# Patient Record
Sex: Female | Born: 1935
Health system: Southern US, Community
[De-identification: ages and names within clinical notes are randomized; demographics above are authoritative.]

## PROBLEM LIST (undated history)

## (undated) DIAGNOSIS — I2510 Atherosclerotic heart disease of native coronary artery without angina pectoris: Secondary | ICD-10-CM

## (undated) DIAGNOSIS — I700 Atherosclerosis of aorta: Secondary | ICD-10-CM

## (undated) DIAGNOSIS — J449 Chronic obstructive pulmonary disease, unspecified: Secondary | ICD-10-CM

## (undated) DIAGNOSIS — J189 Pneumonia, unspecified organism: Secondary | ICD-10-CM

## (undated) DIAGNOSIS — I639 Cerebral infarction, unspecified: Secondary | ICD-10-CM

## (undated) DIAGNOSIS — F0390 Unspecified dementia without behavioral disturbance: Secondary | ICD-10-CM

## (undated) DIAGNOSIS — R112 Nausea with vomiting, unspecified: Secondary | ICD-10-CM

## (undated) DIAGNOSIS — Z9889 Other specified postprocedural states: Secondary | ICD-10-CM

## (undated) DIAGNOSIS — B351 Tinea unguium: Secondary | ICD-10-CM

## (undated) DIAGNOSIS — I10 Essential (primary) hypertension: Secondary | ICD-10-CM

## (undated) DIAGNOSIS — I2584 Coronary atherosclerosis due to calcified coronary lesion: Secondary | ICD-10-CM

## (undated) DIAGNOSIS — I4821 Permanent atrial fibrillation: Secondary | ICD-10-CM

## (undated) DIAGNOSIS — E785 Hyperlipidemia, unspecified: Secondary | ICD-10-CM

## (undated) DIAGNOSIS — R251 Tremor, unspecified: Secondary | ICD-10-CM

## (undated) DIAGNOSIS — I5032 Chronic diastolic (congestive) heart failure: Secondary | ICD-10-CM

## (undated) DIAGNOSIS — F419 Anxiety disorder, unspecified: Secondary | ICD-10-CM

## (undated) DIAGNOSIS — A159 Respiratory tuberculosis unspecified: Secondary | ICD-10-CM

## (undated) DIAGNOSIS — I509 Heart failure, unspecified: Secondary | ICD-10-CM

## (undated) HISTORY — DX: Essential (primary) hypertension: I10

## (undated) HISTORY — DX: Hyperlipidemia, unspecified: E78.5

## (undated) HISTORY — DX: Atherosclerotic heart disease of native coronary artery without angina pectoris: I25.10

## (undated) HISTORY — DX: Atherosclerosis of aorta: I70.0

## (undated) HISTORY — DX: Anxiety disorder, unspecified: F41.9

## (undated) HISTORY — DX: Tremor, unspecified: R25.1

## (undated) HISTORY — DX: Heart failure, unspecified: I50.9

## (undated) HISTORY — DX: Coronary atherosclerosis due to calcified coronary lesion: I25.84

## (undated) HISTORY — DX: Permanent atrial fibrillation: I48.21

## (undated) HISTORY — PX: NASAL SINUS SURGERY: SHX719

## (undated) HISTORY — PX: TOTAL ABDOMINAL HYSTERECTOMY: SHX209

## (undated) HISTORY — PX: HERNIA REPAIR: SHX51

## (undated) HISTORY — DX: Tinea unguium: B35.1

## (undated) HISTORY — DX: Chronic diastolic (congestive) heart failure: I50.32

## (undated) HISTORY — PX: FOOT SURGERY: SHX648

## (undated) HISTORY — PX: BLADDER SURGERY: SHX569

## (undated) HISTORY — PX: CARPAL TUNNEL RELEASE: SHX101

## (undated) HISTORY — DX: Cerebral infarction, unspecified: I63.9

---

## 2008-10-28 DIAGNOSIS — I639 Cerebral infarction, unspecified: Secondary | ICD-10-CM

## 2008-10-28 HISTORY — DX: Cerebral infarction, unspecified: I63.9

## 2008-10-29 DIAGNOSIS — Z8673 Personal history of transient ischemic attack (TIA), and cerebral infarction without residual deficits: Secondary | ICD-10-CM | POA: Insufficient documentation

## 2011-04-21 DIAGNOSIS — J209 Acute bronchitis, unspecified: Secondary | ICD-10-CM | POA: Diagnosis not present

## 2011-04-21 DIAGNOSIS — J019 Acute sinusitis, unspecified: Secondary | ICD-10-CM | POA: Diagnosis not present

## 2011-04-27 DIAGNOSIS — R413 Other amnesia: Secondary | ICD-10-CM | POA: Diagnosis not present

## 2011-04-27 DIAGNOSIS — Z8679 Personal history of other diseases of the circulatory system: Secondary | ICD-10-CM | POA: Diagnosis not present

## 2011-05-08 DIAGNOSIS — R259 Unspecified abnormal involuntary movements: Secondary | ICD-10-CM | POA: Diagnosis not present

## 2011-07-08 DIAGNOSIS — I10 Essential (primary) hypertension: Secondary | ICD-10-CM | POA: Diagnosis not present

## 2011-07-08 DIAGNOSIS — S0990XA Unspecified injury of head, initial encounter: Secondary | ICD-10-CM | POA: Diagnosis not present

## 2011-07-08 DIAGNOSIS — Z8673 Personal history of transient ischemic attack (TIA), and cerebral infarction without residual deficits: Secondary | ICD-10-CM | POA: Diagnosis not present

## 2011-07-08 DIAGNOSIS — S79919A Unspecified injury of unspecified hip, initial encounter: Secondary | ICD-10-CM | POA: Diagnosis not present

## 2011-07-08 DIAGNOSIS — S79929A Unspecified injury of unspecified thigh, initial encounter: Secondary | ICD-10-CM | POA: Diagnosis not present

## 2011-07-08 DIAGNOSIS — G252 Other specified forms of tremor: Secondary | ICD-10-CM | POA: Diagnosis present

## 2011-07-08 DIAGNOSIS — M949 Disorder of cartilage, unspecified: Secondary | ICD-10-CM | POA: Diagnosis present

## 2011-07-08 DIAGNOSIS — D509 Iron deficiency anemia, unspecified: Secondary | ICD-10-CM | POA: Diagnosis not present

## 2011-07-08 DIAGNOSIS — G250 Essential tremor: Secondary | ICD-10-CM | POA: Diagnosis present

## 2011-07-08 DIAGNOSIS — M899 Disorder of bone, unspecified: Secondary | ICD-10-CM | POA: Diagnosis not present

## 2011-07-08 DIAGNOSIS — N949 Unspecified condition associated with female genital organs and menstrual cycle: Secondary | ICD-10-CM | POA: Diagnosis not present

## 2011-07-08 DIAGNOSIS — S32509A Unspecified fracture of unspecified pubis, initial encounter for closed fracture: Secondary | ICD-10-CM | POA: Diagnosis not present

## 2011-07-08 DIAGNOSIS — E785 Hyperlipidemia, unspecified: Secondary | ICD-10-CM | POA: Diagnosis present

## 2011-07-08 DIAGNOSIS — E782 Mixed hyperlipidemia: Secondary | ICD-10-CM | POA: Diagnosis not present

## 2011-07-10 DIAGNOSIS — I10 Essential (primary) hypertension: Secondary | ICD-10-CM | POA: Diagnosis not present

## 2011-07-10 DIAGNOSIS — M6281 Muscle weakness (generalized): Secondary | ICD-10-CM | POA: Diagnosis not present

## 2011-07-10 DIAGNOSIS — S0990XA Unspecified injury of head, initial encounter: Secondary | ICD-10-CM | POA: Diagnosis not present

## 2011-07-10 DIAGNOSIS — R269 Unspecified abnormalities of gait and mobility: Secondary | ICD-10-CM | POA: Diagnosis not present

## 2011-07-10 DIAGNOSIS — E785 Hyperlipidemia, unspecified: Secondary | ICD-10-CM | POA: Diagnosis not present

## 2011-07-10 DIAGNOSIS — IMO0001 Reserved for inherently not codable concepts without codable children: Secondary | ICD-10-CM | POA: Diagnosis not present

## 2011-07-13 DIAGNOSIS — I10 Essential (primary) hypertension: Secondary | ICD-10-CM | POA: Diagnosis not present

## 2011-07-13 DIAGNOSIS — M6281 Muscle weakness (generalized): Secondary | ICD-10-CM | POA: Diagnosis not present

## 2011-07-13 DIAGNOSIS — R269 Unspecified abnormalities of gait and mobility: Secondary | ICD-10-CM | POA: Diagnosis not present

## 2011-07-13 DIAGNOSIS — E785 Hyperlipidemia, unspecified: Secondary | ICD-10-CM | POA: Diagnosis not present

## 2011-07-13 DIAGNOSIS — S0990XA Unspecified injury of head, initial encounter: Secondary | ICD-10-CM | POA: Diagnosis not present

## 2011-07-13 DIAGNOSIS — IMO0001 Reserved for inherently not codable concepts without codable children: Secondary | ICD-10-CM | POA: Diagnosis not present

## 2011-07-14 DIAGNOSIS — E785 Hyperlipidemia, unspecified: Secondary | ICD-10-CM | POA: Diagnosis not present

## 2011-07-14 DIAGNOSIS — M6281 Muscle weakness (generalized): Secondary | ICD-10-CM | POA: Diagnosis not present

## 2011-07-14 DIAGNOSIS — R269 Unspecified abnormalities of gait and mobility: Secondary | ICD-10-CM | POA: Diagnosis not present

## 2011-07-14 DIAGNOSIS — S0990XA Unspecified injury of head, initial encounter: Secondary | ICD-10-CM | POA: Diagnosis not present

## 2011-07-14 DIAGNOSIS — IMO0001 Reserved for inherently not codable concepts without codable children: Secondary | ICD-10-CM | POA: Diagnosis not present

## 2011-07-14 DIAGNOSIS — I10 Essential (primary) hypertension: Secondary | ICD-10-CM | POA: Diagnosis not present

## 2011-07-15 DIAGNOSIS — IMO0001 Reserved for inherently not codable concepts without codable children: Secondary | ICD-10-CM | POA: Diagnosis not present

## 2011-07-15 DIAGNOSIS — I10 Essential (primary) hypertension: Secondary | ICD-10-CM | POA: Diagnosis not present

## 2011-07-15 DIAGNOSIS — S0990XA Unspecified injury of head, initial encounter: Secondary | ICD-10-CM | POA: Diagnosis not present

## 2011-07-15 DIAGNOSIS — R269 Unspecified abnormalities of gait and mobility: Secondary | ICD-10-CM | POA: Diagnosis not present

## 2011-07-15 DIAGNOSIS — E785 Hyperlipidemia, unspecified: Secondary | ICD-10-CM | POA: Diagnosis not present

## 2011-07-15 DIAGNOSIS — M6281 Muscle weakness (generalized): Secondary | ICD-10-CM | POA: Diagnosis not present

## 2011-07-17 DIAGNOSIS — E785 Hyperlipidemia, unspecified: Secondary | ICD-10-CM | POA: Diagnosis not present

## 2011-07-17 DIAGNOSIS — M6281 Muscle weakness (generalized): Secondary | ICD-10-CM | POA: Diagnosis not present

## 2011-07-17 DIAGNOSIS — S0990XA Unspecified injury of head, initial encounter: Secondary | ICD-10-CM | POA: Diagnosis not present

## 2011-07-17 DIAGNOSIS — R269 Unspecified abnormalities of gait and mobility: Secondary | ICD-10-CM | POA: Diagnosis not present

## 2011-07-17 DIAGNOSIS — IMO0001 Reserved for inherently not codable concepts without codable children: Secondary | ICD-10-CM | POA: Diagnosis not present

## 2011-07-17 DIAGNOSIS — I10 Essential (primary) hypertension: Secondary | ICD-10-CM | POA: Diagnosis not present

## 2011-07-20 DIAGNOSIS — I10 Essential (primary) hypertension: Secondary | ICD-10-CM | POA: Diagnosis not present

## 2011-07-20 DIAGNOSIS — R269 Unspecified abnormalities of gait and mobility: Secondary | ICD-10-CM | POA: Diagnosis not present

## 2011-07-20 DIAGNOSIS — S0990XA Unspecified injury of head, initial encounter: Secondary | ICD-10-CM | POA: Diagnosis not present

## 2011-07-20 DIAGNOSIS — IMO0001 Reserved for inherently not codable concepts without codable children: Secondary | ICD-10-CM | POA: Diagnosis not present

## 2011-07-20 DIAGNOSIS — M6281 Muscle weakness (generalized): Secondary | ICD-10-CM | POA: Diagnosis not present

## 2011-07-20 DIAGNOSIS — E785 Hyperlipidemia, unspecified: Secondary | ICD-10-CM | POA: Diagnosis not present

## 2011-07-21 DIAGNOSIS — E785 Hyperlipidemia, unspecified: Secondary | ICD-10-CM | POA: Diagnosis not present

## 2011-07-21 DIAGNOSIS — R269 Unspecified abnormalities of gait and mobility: Secondary | ICD-10-CM | POA: Diagnosis not present

## 2011-07-21 DIAGNOSIS — I10 Essential (primary) hypertension: Secondary | ICD-10-CM | POA: Diagnosis not present

## 2011-07-21 DIAGNOSIS — IMO0001 Reserved for inherently not codable concepts without codable children: Secondary | ICD-10-CM | POA: Diagnosis not present

## 2011-07-21 DIAGNOSIS — M6281 Muscle weakness (generalized): Secondary | ICD-10-CM | POA: Diagnosis not present

## 2011-07-21 DIAGNOSIS — S0990XA Unspecified injury of head, initial encounter: Secondary | ICD-10-CM | POA: Diagnosis not present

## 2011-07-22 DIAGNOSIS — IMO0001 Reserved for inherently not codable concepts without codable children: Secondary | ICD-10-CM | POA: Diagnosis not present

## 2011-07-22 DIAGNOSIS — R269 Unspecified abnormalities of gait and mobility: Secondary | ICD-10-CM | POA: Diagnosis not present

## 2011-07-22 DIAGNOSIS — M6281 Muscle weakness (generalized): Secondary | ICD-10-CM | POA: Diagnosis not present

## 2011-07-22 DIAGNOSIS — E785 Hyperlipidemia, unspecified: Secondary | ICD-10-CM | POA: Diagnosis not present

## 2011-07-22 DIAGNOSIS — S0990XA Unspecified injury of head, initial encounter: Secondary | ICD-10-CM | POA: Diagnosis not present

## 2011-07-22 DIAGNOSIS — I10 Essential (primary) hypertension: Secondary | ICD-10-CM | POA: Diagnosis not present

## 2011-07-23 DIAGNOSIS — I10 Essential (primary) hypertension: Secondary | ICD-10-CM | POA: Diagnosis not present

## 2011-07-23 DIAGNOSIS — S329XXA Fracture of unspecified parts of lumbosacral spine and pelvis, initial encounter for closed fracture: Secondary | ICD-10-CM | POA: Diagnosis not present

## 2011-07-23 DIAGNOSIS — M6281 Muscle weakness (generalized): Secondary | ICD-10-CM | POA: Diagnosis not present

## 2011-07-23 DIAGNOSIS — IMO0001 Reserved for inherently not codable concepts without codable children: Secondary | ICD-10-CM | POA: Diagnosis not present

## 2011-07-23 DIAGNOSIS — E785 Hyperlipidemia, unspecified: Secondary | ICD-10-CM | POA: Diagnosis not present

## 2011-07-23 DIAGNOSIS — R269 Unspecified abnormalities of gait and mobility: Secondary | ICD-10-CM | POA: Diagnosis not present

## 2011-07-23 DIAGNOSIS — S0990XA Unspecified injury of head, initial encounter: Secondary | ICD-10-CM | POA: Diagnosis not present

## 2011-07-23 DIAGNOSIS — E782 Mixed hyperlipidemia: Secondary | ICD-10-CM | POA: Diagnosis not present

## 2011-07-24 DIAGNOSIS — M6281 Muscle weakness (generalized): Secondary | ICD-10-CM | POA: Diagnosis not present

## 2011-07-24 DIAGNOSIS — S0990XA Unspecified injury of head, initial encounter: Secondary | ICD-10-CM | POA: Diagnosis not present

## 2011-07-24 DIAGNOSIS — I10 Essential (primary) hypertension: Secondary | ICD-10-CM | POA: Diagnosis not present

## 2011-07-24 DIAGNOSIS — E785 Hyperlipidemia, unspecified: Secondary | ICD-10-CM | POA: Diagnosis not present

## 2011-07-24 DIAGNOSIS — IMO0001 Reserved for inherently not codable concepts without codable children: Secondary | ICD-10-CM | POA: Diagnosis not present

## 2011-07-24 DIAGNOSIS — R269 Unspecified abnormalities of gait and mobility: Secondary | ICD-10-CM | POA: Diagnosis not present

## 2011-07-25 DIAGNOSIS — R269 Unspecified abnormalities of gait and mobility: Secondary | ICD-10-CM | POA: Diagnosis not present

## 2011-07-25 DIAGNOSIS — S0990XA Unspecified injury of head, initial encounter: Secondary | ICD-10-CM | POA: Diagnosis not present

## 2011-07-25 DIAGNOSIS — IMO0001 Reserved for inherently not codable concepts without codable children: Secondary | ICD-10-CM | POA: Diagnosis not present

## 2011-07-25 DIAGNOSIS — M6281 Muscle weakness (generalized): Secondary | ICD-10-CM | POA: Diagnosis not present

## 2011-07-25 DIAGNOSIS — I10 Essential (primary) hypertension: Secondary | ICD-10-CM | POA: Diagnosis not present

## 2011-07-25 DIAGNOSIS — E785 Hyperlipidemia, unspecified: Secondary | ICD-10-CM | POA: Diagnosis not present

## 2011-07-27 DIAGNOSIS — E785 Hyperlipidemia, unspecified: Secondary | ICD-10-CM | POA: Diagnosis not present

## 2011-07-27 DIAGNOSIS — R269 Unspecified abnormalities of gait and mobility: Secondary | ICD-10-CM | POA: Diagnosis not present

## 2011-07-27 DIAGNOSIS — IMO0001 Reserved for inherently not codable concepts without codable children: Secondary | ICD-10-CM | POA: Diagnosis not present

## 2011-07-27 DIAGNOSIS — S0990XA Unspecified injury of head, initial encounter: Secondary | ICD-10-CM | POA: Diagnosis not present

## 2011-07-27 DIAGNOSIS — I10 Essential (primary) hypertension: Secondary | ICD-10-CM | POA: Diagnosis not present

## 2011-07-27 DIAGNOSIS — M6281 Muscle weakness (generalized): Secondary | ICD-10-CM | POA: Diagnosis not present

## 2011-07-28 DIAGNOSIS — E785 Hyperlipidemia, unspecified: Secondary | ICD-10-CM | POA: Diagnosis not present

## 2011-07-28 DIAGNOSIS — R269 Unspecified abnormalities of gait and mobility: Secondary | ICD-10-CM | POA: Diagnosis not present

## 2011-07-28 DIAGNOSIS — IMO0001 Reserved for inherently not codable concepts without codable children: Secondary | ICD-10-CM | POA: Diagnosis not present

## 2011-07-28 DIAGNOSIS — I10 Essential (primary) hypertension: Secondary | ICD-10-CM | POA: Diagnosis not present

## 2011-07-28 DIAGNOSIS — M6281 Muscle weakness (generalized): Secondary | ICD-10-CM | POA: Diagnosis not present

## 2011-07-28 DIAGNOSIS — S0990XA Unspecified injury of head, initial encounter: Secondary | ICD-10-CM | POA: Diagnosis not present

## 2011-07-29 DIAGNOSIS — M6281 Muscle weakness (generalized): Secondary | ICD-10-CM | POA: Diagnosis not present

## 2011-07-29 DIAGNOSIS — I10 Essential (primary) hypertension: Secondary | ICD-10-CM | POA: Diagnosis not present

## 2011-07-29 DIAGNOSIS — IMO0001 Reserved for inherently not codable concepts without codable children: Secondary | ICD-10-CM | POA: Diagnosis not present

## 2011-07-29 DIAGNOSIS — R269 Unspecified abnormalities of gait and mobility: Secondary | ICD-10-CM | POA: Diagnosis not present

## 2011-07-29 DIAGNOSIS — E785 Hyperlipidemia, unspecified: Secondary | ICD-10-CM | POA: Diagnosis not present

## 2011-07-29 DIAGNOSIS — S0990XA Unspecified injury of head, initial encounter: Secondary | ICD-10-CM | POA: Diagnosis not present

## 2011-07-30 DIAGNOSIS — R269 Unspecified abnormalities of gait and mobility: Secondary | ICD-10-CM | POA: Diagnosis not present

## 2011-07-30 DIAGNOSIS — E785 Hyperlipidemia, unspecified: Secondary | ICD-10-CM | POA: Diagnosis not present

## 2011-07-30 DIAGNOSIS — S0990XA Unspecified injury of head, initial encounter: Secondary | ICD-10-CM | POA: Diagnosis not present

## 2011-07-30 DIAGNOSIS — M6281 Muscle weakness (generalized): Secondary | ICD-10-CM | POA: Diagnosis not present

## 2011-07-30 DIAGNOSIS — I10 Essential (primary) hypertension: Secondary | ICD-10-CM | POA: Diagnosis not present

## 2011-07-30 DIAGNOSIS — IMO0001 Reserved for inherently not codable concepts without codable children: Secondary | ICD-10-CM | POA: Diagnosis not present

## 2011-07-31 DIAGNOSIS — I10 Essential (primary) hypertension: Secondary | ICD-10-CM | POA: Diagnosis not present

## 2011-07-31 DIAGNOSIS — M6281 Muscle weakness (generalized): Secondary | ICD-10-CM | POA: Diagnosis not present

## 2011-07-31 DIAGNOSIS — S0990XA Unspecified injury of head, initial encounter: Secondary | ICD-10-CM | POA: Diagnosis not present

## 2011-07-31 DIAGNOSIS — E785 Hyperlipidemia, unspecified: Secondary | ICD-10-CM | POA: Diagnosis not present

## 2011-07-31 DIAGNOSIS — R269 Unspecified abnormalities of gait and mobility: Secondary | ICD-10-CM | POA: Diagnosis not present

## 2011-07-31 DIAGNOSIS — IMO0001 Reserved for inherently not codable concepts without codable children: Secondary | ICD-10-CM | POA: Diagnosis not present

## 2011-08-20 DIAGNOSIS — S329XXA Fracture of unspecified parts of lumbosacral spine and pelvis, initial encounter for closed fracture: Secondary | ICD-10-CM | POA: Diagnosis not present

## 2011-11-04 DIAGNOSIS — S81009A Unspecified open wound, unspecified knee, initial encounter: Secondary | ICD-10-CM | POA: Diagnosis not present

## 2011-11-04 DIAGNOSIS — S81809A Unspecified open wound, unspecified lower leg, initial encounter: Secondary | ICD-10-CM | POA: Diagnosis not present

## 2011-11-04 DIAGNOSIS — I10 Essential (primary) hypertension: Secondary | ICD-10-CM | POA: Diagnosis not present

## 2011-11-06 DIAGNOSIS — S81009A Unspecified open wound, unspecified knee, initial encounter: Secondary | ICD-10-CM | POA: Diagnosis not present

## 2011-11-06 DIAGNOSIS — S91009A Unspecified open wound, unspecified ankle, initial encounter: Secondary | ICD-10-CM | POA: Diagnosis not present

## 2011-11-09 DIAGNOSIS — E782 Mixed hyperlipidemia: Secondary | ICD-10-CM | POA: Diagnosis not present

## 2011-11-09 DIAGNOSIS — S81809A Unspecified open wound, unspecified lower leg, initial encounter: Secondary | ICD-10-CM | POA: Diagnosis not present

## 2011-11-09 DIAGNOSIS — I10 Essential (primary) hypertension: Secondary | ICD-10-CM | POA: Diagnosis not present

## 2011-11-11 DIAGNOSIS — S81809A Unspecified open wound, unspecified lower leg, initial encounter: Secondary | ICD-10-CM | POA: Diagnosis not present

## 2011-11-13 DIAGNOSIS — W1809XA Striking against other object with subsequent fall, initial encounter: Secondary | ICD-10-CM | POA: Diagnosis not present

## 2011-11-13 DIAGNOSIS — S91009A Unspecified open wound, unspecified ankle, initial encounter: Secondary | ICD-10-CM | POA: Diagnosis not present

## 2011-11-13 DIAGNOSIS — S81009A Unspecified open wound, unspecified knee, initial encounter: Secondary | ICD-10-CM | POA: Diagnosis not present

## 2011-11-19 DIAGNOSIS — I679 Cerebrovascular disease, unspecified: Secondary | ICD-10-CM | POA: Diagnosis not present

## 2011-11-19 DIAGNOSIS — Z8679 Personal history of other diseases of the circulatory system: Secondary | ICD-10-CM | POA: Diagnosis not present

## 2011-11-19 DIAGNOSIS — G250 Essential tremor: Secondary | ICD-10-CM | POA: Diagnosis not present

## 2011-11-19 DIAGNOSIS — I69919 Unspecified symptoms and signs involving cognitive functions following unspecified cerebrovascular disease: Secondary | ICD-10-CM | POA: Diagnosis not present

## 2011-11-27 DIAGNOSIS — S81809A Unspecified open wound, unspecified lower leg, initial encounter: Secondary | ICD-10-CM | POA: Diagnosis not present

## 2011-11-27 DIAGNOSIS — S81009A Unspecified open wound, unspecified knee, initial encounter: Secondary | ICD-10-CM | POA: Diagnosis not present

## 2011-11-27 DIAGNOSIS — W1809XA Striking against other object with subsequent fall, initial encounter: Secondary | ICD-10-CM | POA: Diagnosis not present

## 2011-11-27 DIAGNOSIS — I87399 Chronic venous hypertension (idiopathic) with other complications of unspecified lower extremity: Secondary | ICD-10-CM | POA: Diagnosis not present

## 2011-11-27 DIAGNOSIS — S91009A Unspecified open wound, unspecified ankle, initial encounter: Secondary | ICD-10-CM | POA: Diagnosis not present

## 2011-12-11 DIAGNOSIS — W1809XA Striking against other object with subsequent fall, initial encounter: Secondary | ICD-10-CM | POA: Diagnosis not present

## 2011-12-11 DIAGNOSIS — S91009A Unspecified open wound, unspecified ankle, initial encounter: Secondary | ICD-10-CM | POA: Diagnosis not present

## 2011-12-11 DIAGNOSIS — S81809A Unspecified open wound, unspecified lower leg, initial encounter: Secondary | ICD-10-CM | POA: Diagnosis not present

## 2011-12-11 DIAGNOSIS — S81009A Unspecified open wound, unspecified knee, initial encounter: Secondary | ICD-10-CM | POA: Diagnosis not present

## 2012-01-19 DIAGNOSIS — E871 Hypo-osmolality and hyponatremia: Secondary | ICD-10-CM | POA: Diagnosis not present

## 2012-01-19 DIAGNOSIS — I6320 Cerebral infarction due to unspecified occlusion or stenosis of unspecified precerebral arteries: Secondary | ICD-10-CM | POA: Diagnosis not present

## 2012-01-19 DIAGNOSIS — Z23 Encounter for immunization: Secondary | ICD-10-CM | POA: Diagnosis not present

## 2012-01-19 DIAGNOSIS — M25569 Pain in unspecified knee: Secondary | ICD-10-CM | POA: Diagnosis not present

## 2012-02-10 DIAGNOSIS — Z1231 Encounter for screening mammogram for malignant neoplasm of breast: Secondary | ICD-10-CM | POA: Diagnosis not present

## 2012-02-17 DIAGNOSIS — H04129 Dry eye syndrome of unspecified lacrimal gland: Secondary | ICD-10-CM | POA: Diagnosis not present

## 2012-03-14 DIAGNOSIS — E871 Hypo-osmolality and hyponatremia: Secondary | ICD-10-CM | POA: Diagnosis not present

## 2012-04-29 DIAGNOSIS — G250 Essential tremor: Secondary | ICD-10-CM | POA: Diagnosis not present

## 2012-04-29 DIAGNOSIS — L0390 Cellulitis, unspecified: Secondary | ICD-10-CM | POA: Diagnosis not present

## 2012-04-29 DIAGNOSIS — I8310 Varicose veins of unspecified lower extremity with inflammation: Secondary | ICD-10-CM | POA: Diagnosis not present

## 2012-05-09 DIAGNOSIS — I8310 Varicose veins of unspecified lower extremity with inflammation: Secondary | ICD-10-CM | POA: Diagnosis not present

## 2012-05-09 DIAGNOSIS — L0390 Cellulitis, unspecified: Secondary | ICD-10-CM | POA: Diagnosis not present

## 2012-05-09 DIAGNOSIS — L0291 Cutaneous abscess, unspecified: Secondary | ICD-10-CM | POA: Diagnosis not present

## 2012-08-01 DIAGNOSIS — I8310 Varicose veins of unspecified lower extremity with inflammation: Secondary | ICD-10-CM | POA: Diagnosis not present

## 2012-08-01 DIAGNOSIS — E871 Hypo-osmolality and hyponatremia: Secondary | ICD-10-CM | POA: Diagnosis not present

## 2012-08-01 DIAGNOSIS — E782 Mixed hyperlipidemia: Secondary | ICD-10-CM | POA: Diagnosis not present

## 2012-08-01 DIAGNOSIS — L0291 Cutaneous abscess, unspecified: Secondary | ICD-10-CM | POA: Diagnosis not present

## 2012-08-08 DIAGNOSIS — D649 Anemia, unspecified: Secondary | ICD-10-CM | POA: Diagnosis not present

## 2012-08-08 DIAGNOSIS — E871 Hypo-osmolality and hyponatremia: Secondary | ICD-10-CM | POA: Diagnosis not present

## 2012-08-08 DIAGNOSIS — D239 Other benign neoplasm of skin, unspecified: Secondary | ICD-10-CM | POA: Diagnosis not present

## 2012-08-08 DIAGNOSIS — I10 Essential (primary) hypertension: Secondary | ICD-10-CM | POA: Diagnosis not present

## 2012-11-21 DIAGNOSIS — Z8679 Personal history of other diseases of the circulatory system: Secondary | ICD-10-CM | POA: Diagnosis not present

## 2012-11-21 DIAGNOSIS — G250 Essential tremor: Secondary | ICD-10-CM | POA: Diagnosis not present

## 2012-12-02 DIAGNOSIS — E871 Hypo-osmolality and hyponatremia: Secondary | ICD-10-CM | POA: Diagnosis not present

## 2012-12-02 DIAGNOSIS — D509 Iron deficiency anemia, unspecified: Secondary | ICD-10-CM | POA: Diagnosis not present

## 2012-12-02 DIAGNOSIS — R7309 Other abnormal glucose: Secondary | ICD-10-CM | POA: Diagnosis not present

## 2012-12-09 DIAGNOSIS — I6320 Cerebral infarction due to unspecified occlusion or stenosis of unspecified precerebral arteries: Secondary | ICD-10-CM | POA: Diagnosis not present

## 2012-12-09 DIAGNOSIS — I10 Essential (primary) hypertension: Secondary | ICD-10-CM | POA: Diagnosis not present

## 2012-12-09 DIAGNOSIS — E871 Hypo-osmolality and hyponatremia: Secondary | ICD-10-CM | POA: Diagnosis not present

## 2012-12-09 DIAGNOSIS — Z23 Encounter for immunization: Secondary | ICD-10-CM | POA: Diagnosis not present

## 2013-03-30 DIAGNOSIS — I4821 Permanent atrial fibrillation: Secondary | ICD-10-CM

## 2013-03-30 HISTORY — DX: Permanent atrial fibrillation: I48.21

## 2013-04-18 DIAGNOSIS — Z7901 Long term (current) use of anticoagulants: Secondary | ICD-10-CM | POA: Diagnosis not present

## 2013-04-18 DIAGNOSIS — I4891 Unspecified atrial fibrillation: Secondary | ICD-10-CM | POA: Diagnosis not present

## 2013-04-18 DIAGNOSIS — I6320 Cerebral infarction due to unspecified occlusion or stenosis of unspecified precerebral arteries: Secondary | ICD-10-CM | POA: Diagnosis not present

## 2013-04-18 DIAGNOSIS — E871 Hypo-osmolality and hyponatremia: Secondary | ICD-10-CM | POA: Diagnosis not present

## 2013-04-18 DIAGNOSIS — E785 Hyperlipidemia, unspecified: Secondary | ICD-10-CM | POA: Diagnosis not present

## 2013-04-18 DIAGNOSIS — I10 Essential (primary) hypertension: Secondary | ICD-10-CM | POA: Diagnosis not present

## 2013-04-18 DIAGNOSIS — J189 Pneumonia, unspecified organism: Secondary | ICD-10-CM | POA: Diagnosis not present

## 2013-04-18 DIAGNOSIS — Z0000 Encounter for general adult medical examination without abnormal findings: Secondary | ICD-10-CM | POA: Diagnosis not present

## 2013-04-25 DIAGNOSIS — I10 Essential (primary) hypertension: Secondary | ICD-10-CM | POA: Diagnosis not present

## 2013-04-25 DIAGNOSIS — I4891 Unspecified atrial fibrillation: Secondary | ICD-10-CM | POA: Diagnosis not present

## 2013-05-02 DIAGNOSIS — I10 Essential (primary) hypertension: Secondary | ICD-10-CM | POA: Diagnosis not present

## 2013-05-02 DIAGNOSIS — I4891 Unspecified atrial fibrillation: Secondary | ICD-10-CM | POA: Diagnosis not present

## 2013-05-02 DIAGNOSIS — I428 Other cardiomyopathies: Secondary | ICD-10-CM | POA: Diagnosis not present

## 2013-05-02 DIAGNOSIS — E785 Hyperlipidemia, unspecified: Secondary | ICD-10-CM | POA: Diagnosis not present

## 2013-05-09 DIAGNOSIS — I428 Other cardiomyopathies: Secondary | ICD-10-CM | POA: Diagnosis not present

## 2013-05-09 DIAGNOSIS — I10 Essential (primary) hypertension: Secondary | ICD-10-CM | POA: Diagnosis not present

## 2013-05-09 DIAGNOSIS — E785 Hyperlipidemia, unspecified: Secondary | ICD-10-CM | POA: Diagnosis not present

## 2013-05-09 DIAGNOSIS — I4891 Unspecified atrial fibrillation: Secondary | ICD-10-CM | POA: Diagnosis not present

## 2013-05-16 DIAGNOSIS — I428 Other cardiomyopathies: Secondary | ICD-10-CM | POA: Diagnosis not present

## 2013-05-16 DIAGNOSIS — I10 Essential (primary) hypertension: Secondary | ICD-10-CM | POA: Diagnosis not present

## 2013-05-16 DIAGNOSIS — E785 Hyperlipidemia, unspecified: Secondary | ICD-10-CM | POA: Diagnosis not present

## 2013-05-16 DIAGNOSIS — I4891 Unspecified atrial fibrillation: Secondary | ICD-10-CM | POA: Diagnosis not present

## 2013-06-13 DIAGNOSIS — Z7901 Long term (current) use of anticoagulants: Secondary | ICD-10-CM | POA: Diagnosis not present

## 2013-06-13 DIAGNOSIS — E785 Hyperlipidemia, unspecified: Secondary | ICD-10-CM | POA: Diagnosis not present

## 2013-06-13 DIAGNOSIS — I10 Essential (primary) hypertension: Secondary | ICD-10-CM | POA: Diagnosis not present

## 2013-06-13 DIAGNOSIS — I4891 Unspecified atrial fibrillation: Secondary | ICD-10-CM | POA: Diagnosis not present

## 2013-06-13 DIAGNOSIS — I428 Other cardiomyopathies: Secondary | ICD-10-CM | POA: Diagnosis not present

## 2013-08-15 DIAGNOSIS — H04129 Dry eye syndrome of unspecified lacrimal gland: Secondary | ICD-10-CM | POA: Diagnosis not present

## 2013-08-30 DIAGNOSIS — G250 Essential tremor: Secondary | ICD-10-CM | POA: Diagnosis not present

## 2013-09-12 DIAGNOSIS — I10 Essential (primary) hypertension: Secondary | ICD-10-CM | POA: Diagnosis not present

## 2013-09-12 DIAGNOSIS — E785 Hyperlipidemia, unspecified: Secondary | ICD-10-CM | POA: Diagnosis not present

## 2013-09-12 DIAGNOSIS — I428 Other cardiomyopathies: Secondary | ICD-10-CM | POA: Diagnosis not present

## 2013-09-12 DIAGNOSIS — I4891 Unspecified atrial fibrillation: Secondary | ICD-10-CM | POA: Diagnosis not present

## 2013-09-12 DIAGNOSIS — Z7901 Long term (current) use of anticoagulants: Secondary | ICD-10-CM | POA: Diagnosis not present

## 2013-09-14 DIAGNOSIS — Z7901 Long term (current) use of anticoagulants: Secondary | ICD-10-CM | POA: Diagnosis not present

## 2013-09-14 DIAGNOSIS — I4891 Unspecified atrial fibrillation: Secondary | ICD-10-CM | POA: Diagnosis not present

## 2013-09-22 DIAGNOSIS — I8310 Varicose veins of unspecified lower extremity with inflammation: Secondary | ICD-10-CM | POA: Diagnosis not present

## 2013-09-22 DIAGNOSIS — I4891 Unspecified atrial fibrillation: Secondary | ICD-10-CM | POA: Diagnosis not present

## 2013-09-22 DIAGNOSIS — I6320 Cerebral infarction due to unspecified occlusion or stenosis of unspecified precerebral arteries: Secondary | ICD-10-CM | POA: Diagnosis not present

## 2013-09-22 DIAGNOSIS — E871 Hypo-osmolality and hyponatremia: Secondary | ICD-10-CM | POA: Diagnosis not present

## 2013-09-22 DIAGNOSIS — I10 Essential (primary) hypertension: Secondary | ICD-10-CM | POA: Diagnosis not present

## 2013-09-28 DIAGNOSIS — Z1231 Encounter for screening mammogram for malignant neoplasm of breast: Secondary | ICD-10-CM | POA: Diagnosis not present

## 2013-10-15 DIAGNOSIS — S81009A Unspecified open wound, unspecified knee, initial encounter: Secondary | ICD-10-CM | POA: Diagnosis not present

## 2013-10-15 DIAGNOSIS — S91009A Unspecified open wound, unspecified ankle, initial encounter: Secondary | ICD-10-CM | POA: Diagnosis not present

## 2013-10-18 DIAGNOSIS — S81009A Unspecified open wound, unspecified knee, initial encounter: Secondary | ICD-10-CM | POA: Diagnosis not present

## 2013-10-18 DIAGNOSIS — L03119 Cellulitis of unspecified part of limb: Secondary | ICD-10-CM | POA: Diagnosis not present

## 2013-10-18 DIAGNOSIS — S81809A Unspecified open wound, unspecified lower leg, initial encounter: Secondary | ICD-10-CM | POA: Diagnosis not present

## 2013-10-18 DIAGNOSIS — S91009A Unspecified open wound, unspecified ankle, initial encounter: Secondary | ICD-10-CM | POA: Diagnosis not present

## 2013-10-18 DIAGNOSIS — L02419 Cutaneous abscess of limb, unspecified: Secondary | ICD-10-CM | POA: Diagnosis not present

## 2013-10-18 DIAGNOSIS — M25569 Pain in unspecified knee: Secondary | ICD-10-CM | POA: Diagnosis not present

## 2013-10-19 DIAGNOSIS — L03119 Cellulitis of unspecified part of limb: Secondary | ICD-10-CM | POA: Diagnosis not present

## 2013-10-19 DIAGNOSIS — M25569 Pain in unspecified knee: Secondary | ICD-10-CM | POA: Diagnosis not present

## 2013-10-19 DIAGNOSIS — L02419 Cutaneous abscess of limb, unspecified: Secondary | ICD-10-CM | POA: Diagnosis not present

## 2013-10-23 DIAGNOSIS — L03119 Cellulitis of unspecified part of limb: Secondary | ICD-10-CM | POA: Diagnosis not present

## 2013-10-23 DIAGNOSIS — L02419 Cutaneous abscess of limb, unspecified: Secondary | ICD-10-CM | POA: Diagnosis not present

## 2013-10-27 DIAGNOSIS — L02419 Cutaneous abscess of limb, unspecified: Secondary | ICD-10-CM | POA: Diagnosis not present

## 2013-10-27 DIAGNOSIS — L03119 Cellulitis of unspecified part of limb: Secondary | ICD-10-CM | POA: Diagnosis not present

## 2013-12-06 DIAGNOSIS — J209 Acute bronchitis, unspecified: Secondary | ICD-10-CM | POA: Diagnosis not present

## 2013-12-06 DIAGNOSIS — J019 Acute sinusitis, unspecified: Secondary | ICD-10-CM | POA: Diagnosis not present

## 2014-02-09 DIAGNOSIS — Z23 Encounter for immunization: Secondary | ICD-10-CM | POA: Diagnosis not present

## 2014-05-01 DIAGNOSIS — I4891 Unspecified atrial fibrillation: Secondary | ICD-10-CM | POA: Diagnosis not present

## 2014-05-01 DIAGNOSIS — I639 Cerebral infarction, unspecified: Secondary | ICD-10-CM | POA: Diagnosis not present

## 2014-05-01 DIAGNOSIS — F419 Anxiety disorder, unspecified: Secondary | ICD-10-CM | POA: Diagnosis not present

## 2014-05-01 DIAGNOSIS — I10 Essential (primary) hypertension: Secondary | ICD-10-CM | POA: Diagnosis not present

## 2014-05-02 ENCOUNTER — Telehealth: Payer: Self-pay | Admitting: Cardiology

## 2014-05-02 NOTE — Telephone Encounter (Signed)
Received records from Dr [name] for appointment on 07/13/14 with Dr Ellyn Hack.  Records given to [facility] (medical records) for Dr Allison Quarry schedule on 07/13/14.  lp

## 2014-05-25 ENCOUNTER — Encounter: Payer: Self-pay | Admitting: *Deleted

## 2014-05-25 ENCOUNTER — Encounter: Payer: Self-pay | Admitting: Neurology

## 2014-05-25 ENCOUNTER — Ambulatory Visit (INDEPENDENT_AMBULATORY_CARE_PROVIDER_SITE_OTHER): Payer: Medicare Other | Admitting: Neurology

## 2014-05-25 VITALS — BP 130/87 | HR 68 | Ht 68.0 in | Wt 131.0 lb

## 2014-05-25 DIAGNOSIS — I639 Cerebral infarction, unspecified: Secondary | ICD-10-CM

## 2014-05-25 DIAGNOSIS — I482 Chronic atrial fibrillation, unspecified: Secondary | ICD-10-CM

## 2014-05-25 DIAGNOSIS — G250 Essential tremor: Secondary | ICD-10-CM

## 2014-05-25 NOTE — Progress Notes (Signed)
PATIENT: Megan Munoz DOB: September 24, 1932  HISTORICAL  Megan Munoz is a 81 yo RH , female, is accompanied by her daughter [name], referred by her primary care physician Dr Ernie Hew for continued neurological care for her history of stroke, essential tremor,  She had a history of hypertension, hyperlipidemia, diagnosis of age of ablation since 2015, is taking Xarelto  She suffered left frontal, basal ganglion stroke in 2010, presented with mild confusion, slow processing time, initially was treated with aspirin, since her diagnosis of age of ablation in 2015, she is now on anticoagulation Xarelto  She moved in with her daughter recently, no longer driving, but independent in her daily activity, doing laundry, ambulate without assistance, mild memory trouble today's Mini-Mental Status Examination is 26 out of 30  She had long-standing history of gradual onset bilateral hands tremor, was diagnosed with essential tremor, acute worsening since her stroke in 2010, they have tried variable dose of primidone up to 750 mg daily, currently taking 250 mg twice a day, which works out best for her, she still has posturing tremor of her bilateral hands,   REVIEW OF SYSTEMS: Full 14 system review of systems performed and notable only for as a  ALLERGIES: Allergies  Allergen Reactions  . Tetanus Toxoids     HOME MEDICATIONS: Current Outpatient Prescriptions  Medication Sig Dispense Refill  . alendronate (FOSAMAX) 70 MG tablet Take 70 mg by mouth once a week. Take with a full glass of water on an empty stomach.    Marland Kitchen atenolol (TENORMIN) 25 MG tablet Take by mouth 2 (two) times daily.     . B Complex Vitamins (VITAMIN B COMPLEX PO) Take by mouth daily.    . Calcium Carb-Cholecalciferol (CALCIUM + D3) 600-200 MG-UNIT TABS Take by mouth daily.    . diphenhydrAMINE (SOMINEX) 25 MG tablet Take 50 mg by mouth at bedtime as needed for sleep.     . ferrous sulfate 325 (65 FE) MG tablet Take 325 mg by mouth daily  with breakfast.    . Flaxseed, Linseed, (EQL FLAX SEED OIL) 1000 MG CAPS Take by mouth daily.    Marland Kitchen FLUoxetine (PROZAC) 20 MG capsule Take 20 mg by mouth daily.    Marland Kitchen lisinopril (PRINIVIL,ZESTRIL) 20 MG tablet Take 20 mg by mouth. 0.5 tab daily    . loratadine (CLARITIN) 10 MG tablet Take 10 mg by mouth daily.    . Melatonin 10 MG TABS Take by mouth daily.    . Multiple Vitamin (MULTIVITAMIN) capsule Take 1 capsule by mouth daily.    . primidone (MYSOLINE) 250 MG tablet Take 250 mg by mouth 2 (two) times daily as needed.     . rivaroxaban (XARELTO) 20 MG TABS tablet Take 20 mg by mouth daily with supper.    . simvastatin (ZOCOR) 40 MG tablet Take 40 mg by mouth daily.       PAST MEDICAL HISTORY: Past Medical History  Diagnosis Date  . Anxiety   . Cerebral infarction   . A-fib   . Hypertension   . Tinea unguium   . Hyperlipemia   . Tremor     PAST SURGICAL HISTORY: Past Surgical History  Procedure Laterality Date  . Carpal tunnel release Left   . Total abdominal hysterectomy    . Foot surgery Right   . Bladder surgery    . Hernia repair    . Nasal sinus surgery      FAMILY HISTORY: Family History  Problem Relation Age of  Onset  . Osteoporosis Mother   . Emphysema Father     SOCIAL HISTORY:  History   Social History  . Marital Status: Widowed    Spouse Name: N/A  . Number of Children: 1  . Years of Education: 15   Occupational History  . Retired    Social History Main Topics  . Smoking status: Never Smoker   . Smokeless tobacco: Not on file  . Alcohol Use: No  . Drug Use: No  . Sexual Activity: Not on file   Other Topics Concern  . Not on file   Social History Narrative   Lives with her daughter.   Right-handed.   2-4 cups caffeine/day     PHYSICAL EXAM   Filed Vitals:   05/25/14 0957  BP: 130/87  Pulse: 68  Height: 5\' 8"  (1.727 m)  Weight: 131 lb (59.421 kg)    Not recorded      Body mass index is 19.92 kg/(m^2).  PHYSICAL  EXAMNIATION:  Gen: NAD, conversant, well nourised, obese, well groomed                     Cardiovascular: Regular rate rhythm, no peripheral edema, warm, nontender. Eyes: Conjunctivae clear without exudates or hemorrhage Neck: Supple, no carotid bruise. Pulmonary: Clear to auscultation bilaterally   NEUROLOGICAL EXAM:  MENTAL STATUS: Speech:    Speech is normal; fluent and spontaneous with normal comprehension.  Cognition:     Mini-Mental Status Examination is 26 out of 30, she missed 3 out of 3 recalls, is not oriented to place,  CRANIAL NERVES: CN II: Visual fields are full to confrontation. Fundoscopic exam is normal with sharp discs and no vascular changes. Venous pulsations are present bilaterally. Pupils are 4 mm and briskly reactive to light. Visual acuity is 20/20 bilaterally. CN III, IV, VI: extraocular movement are normal. No ptosis. CN V: Facial sensation is intact to pinprick in all 3 divisions bilaterally. Corneal responses are intact.  CN VII: Face is symmetric with normal eye closure and smile. CN VIII: Hearing is normal to rubbing fingers CN IX, X: Palate elevates symmetrically. Phonation is normal. CN XI: Head turning and shoulder shrug are intact CN XII: Tongue is midline with normal movements and no atrophy.  MOTOR:  Bilateral hands posturing tremor, there was no rigidity, Muscle bulk and tone are normal. Muscle strength is normal.   Shoulder abduction Shoulder external rotation Elbow flexion Elbow extension Wrist flexion Wrist extension Finger abduction Hip flexion Knee flexion Knee extension Ankle dorsi flexion Ankle plantar flexion  R 5 5 5 5 5 5 5 5 5 5 5 5   L 5 5 5 5 5 5 5 5 5 5 5 5     REFLEXES: Reflexes are 2+ and symmetric at the biceps, triceps, knees, and ankles. Plantar responses are flexor.  SENSORY: Mildly length dependent decreased light touch, pinprick to ankle level, decreased vibratory sensation to ankle.  COORDINATION: Rapid alternating  movements and fine finger movements are intact. There is no dysmetria on finger-to-nose and heel-knee-shin. There are no abnormal or extraneous movements.   GAIT/STANCE: She tends to guard her lumbar paraspinal muscles,Gait is steady, good arm swing, and turning. Heel and toe walking are normal. Tandem gait is normal.  Romberg is absent.   DIAGNOSTIC DATA (LABS, IMAGING, TESTING) - I reviewed patient records, labs, notes, testing and imaging myself where available   ASSESSMENT AND PLAN  Xaniyah Buchholz is a 81 y.o. female  with vascular risk  factor of hypertension, hyperlipidemia, atrial fibrillation, on Xarelto treatment, essential tremor, taking primidone 250 mg twice a day, mild memory trouble, Mini-Mental Status Examination 26 out of 30  1, Get her medical record including MRI scan, neuropsychiatric evaluation from previous treating facility 2, continue Xarelto as stroke prevention 3. Primidone 250 mg twice a day for essential tremor 4. RTC in 6 months   Marcial Pacas, M.D. Ph.D.  [facility] [address] [place], [place] [address] Ph: [phone] Fax: [phone]

## 2014-05-28 ENCOUNTER — Telehealth: Payer: Self-pay | Admitting: *Deleted

## 2014-05-28 NOTE — Telephone Encounter (Signed)
Release fax to Dr. Blair Heys and Dr. Ezequiel Ganser  02/29/2015.

## 2014-05-30 ENCOUNTER — Telehealth: Payer: Self-pay | Admitting: Cardiology

## 2014-05-31 NOTE — Telephone Encounter (Signed)
Close encounter 

## 2014-06-15 ENCOUNTER — Telehealth: Payer: Self-pay | Admitting: *Deleted

## 2014-06-15 NOTE — Telephone Encounter (Signed)
Received from [facility]. MRI completed at [facility] [phone]) on 05/08/2011.  MRI Brain w/wo: Alignment of the craniocervical junction appears normal.   The basilar cisterns are patent. No acute posterior fossa abnormality is demonstrated. Background microvascular changes are present within the pons.  Impression: 1.  No MR evidence of an acute intracranial abnormality. 2.  Sequela of previous left frontal infarct (with resultant cystic encephalomalacia with surrounding gliosis). 3.  Age-appropriate global cerebral volume loss and background microvascular disease. 4.  No MR evidence of abnormal intracranial enhancement. 5.  Bilateral maxillary sinus disease with small fluid levels suggesting an acute sinusitis.

## 2014-07-02 ENCOUNTER — Other Ambulatory Visit: Payer: Self-pay | Admitting: Family Medicine

## 2014-07-02 ENCOUNTER — Ambulatory Visit
Admission: RE | Admit: 2014-07-02 | Discharge: 2014-07-02 | Disposition: A | Discharge status: Home / Self Care | Payer: Medicare Other | Source: Ambulatory Visit | Attending: Family Medicine | Admitting: Family Medicine

## 2014-07-02 DIAGNOSIS — R05 Cough: Secondary | ICD-10-CM | POA: Diagnosis not present

## 2014-07-02 DIAGNOSIS — I4891 Unspecified atrial fibrillation: Secondary | ICD-10-CM | POA: Diagnosis not present

## 2014-07-02 DIAGNOSIS — I10 Essential (primary) hypertension: Secondary | ICD-10-CM | POA: Diagnosis not present

## 2014-07-02 DIAGNOSIS — R059 Cough, unspecified: Secondary | ICD-10-CM

## 2014-07-02 DIAGNOSIS — J189 Pneumonia, unspecified organism: Secondary | ICD-10-CM | POA: Diagnosis not present

## 2014-07-02 DIAGNOSIS — R0602 Shortness of breath: Secondary | ICD-10-CM | POA: Diagnosis not present

## 2014-07-02 DIAGNOSIS — R509 Fever, unspecified: Secondary | ICD-10-CM

## 2014-07-04 DIAGNOSIS — Z7901 Long term (current) use of anticoagulants: Secondary | ICD-10-CM | POA: Diagnosis not present

## 2014-07-04 DIAGNOSIS — E559 Vitamin D deficiency, unspecified: Secondary | ICD-10-CM | POA: Diagnosis not present

## 2014-07-04 DIAGNOSIS — R5383 Other fatigue: Secondary | ICD-10-CM | POA: Diagnosis not present

## 2014-07-04 DIAGNOSIS — J189 Pneumonia, unspecified organism: Secondary | ICD-10-CM | POA: Diagnosis not present

## 2014-07-04 DIAGNOSIS — I428 Other cardiomyopathies: Secondary | ICD-10-CM | POA: Diagnosis not present

## 2014-07-06 DIAGNOSIS — E785 Hyperlipidemia, unspecified: Secondary | ICD-10-CM | POA: Diagnosis not present

## 2014-07-06 DIAGNOSIS — J189 Pneumonia, unspecified organism: Secondary | ICD-10-CM | POA: Diagnosis not present

## 2014-07-06 DIAGNOSIS — I10 Essential (primary) hypertension: Secondary | ICD-10-CM | POA: Diagnosis not present

## 2014-07-06 DIAGNOSIS — I428 Other cardiomyopathies: Secondary | ICD-10-CM | POA: Diagnosis not present

## 2014-07-07 ENCOUNTER — Emergency Department (HOSPITAL_COMMUNITY): Payer: Medicare Other

## 2014-07-07 ENCOUNTER — Encounter (HOSPITAL_COMMUNITY): Payer: Self-pay | Admitting: Nurse Practitioner

## 2014-07-07 ENCOUNTER — Observation Stay (HOSPITAL_COMMUNITY)
Admission: EM | Admit: 2014-07-07 | Discharge: 2014-07-09 | Disposition: A | Discharge status: Home / Self Care | Payer: Medicare Other | Attending: Internal Medicine | Admitting: Internal Medicine

## 2014-07-07 DIAGNOSIS — Z8619 Personal history of other infectious and parasitic diseases: Secondary | ICD-10-CM | POA: Insufficient documentation

## 2014-07-07 DIAGNOSIS — I10 Essential (primary) hypertension: Secondary | ICD-10-CM | POA: Insufficient documentation

## 2014-07-07 DIAGNOSIS — F419 Anxiety disorder, unspecified: Secondary | ICD-10-CM | POA: Diagnosis not present

## 2014-07-07 DIAGNOSIS — Z8673 Personal history of transient ischemic attack (TIA), and cerebral infarction without residual deficits: Secondary | ICD-10-CM | POA: Insufficient documentation

## 2014-07-07 DIAGNOSIS — R55 Syncope and collapse: Principal | ICD-10-CM | POA: Diagnosis present

## 2014-07-07 DIAGNOSIS — R251 Tremor, unspecified: Secondary | ICD-10-CM | POA: Diagnosis not present

## 2014-07-07 DIAGNOSIS — E785 Hyperlipidemia, unspecified: Secondary | ICD-10-CM | POA: Diagnosis present

## 2014-07-07 DIAGNOSIS — I4891 Unspecified atrial fibrillation: Secondary | ICD-10-CM | POA: Insufficient documentation

## 2014-07-07 DIAGNOSIS — Z792 Long term (current) use of antibiotics: Secondary | ICD-10-CM | POA: Insufficient documentation

## 2014-07-07 DIAGNOSIS — I482 Chronic atrial fibrillation: Secondary | ICD-10-CM

## 2014-07-07 DIAGNOSIS — I4821 Permanent atrial fibrillation: Secondary | ICD-10-CM | POA: Diagnosis present

## 2014-07-07 DIAGNOSIS — Z79899 Other long term (current) drug therapy: Secondary | ICD-10-CM | POA: Insufficient documentation

## 2014-07-07 DIAGNOSIS — F1510 Other stimulant abuse, uncomplicated: Secondary | ICD-10-CM | POA: Diagnosis not present

## 2014-07-07 DIAGNOSIS — F1110 Opioid abuse, uncomplicated: Secondary | ICD-10-CM | POA: Diagnosis not present

## 2014-07-07 LAB — URINALYSIS, ROUTINE W REFLEX MICROSCOPIC
Bilirubin Urine: NEGATIVE
Glucose, UA: NEGATIVE mg/dL
Hgb urine dipstick: NEGATIVE
Ketones, ur: NEGATIVE mg/dL
Nitrite: NEGATIVE
Protein, ur: NEGATIVE mg/dL
Specific Gravity, Urine: 1.011 (ref 1.005–1.030)
Urobilinogen, UA: 0.2 mg/dL (ref 0.0–1.0)
pH: 7 (ref 5.0–8.0)

## 2014-07-07 LAB — CBC WITH DIFFERENTIAL/PLATELET
Basophils Absolute: 0 10*3/uL (ref 0.0–0.1)
Basophils Relative: 1 % (ref 0–1)
Eosinophils Absolute: 0.1 10*3/uL (ref 0.0–0.7)
Eosinophils Relative: 1 % (ref 0–5)
HCT: 35.9 % — ABNORMAL LOW (ref 36.0–46.0)
Hemoglobin: 12.2 g/dL (ref 12.0–15.0)
Lymphocytes Relative: 20 % (ref 12–46)
Lymphs Abs: 1 10*3/uL (ref 0.7–4.0)
MCH: 32.6 pg (ref 26.0–34.0)
MCHC: 34 g/dL (ref 30.0–36.0)
MCV: 96 fL (ref 78.0–100.0)
Monocytes Absolute: 0.4 10*3/uL (ref 0.1–1.0)
Monocytes Relative: 9 % (ref 3–12)
Neutro Abs: 3.4 10*3/uL (ref 1.7–7.7)
Neutrophils Relative %: 69 % (ref 43–77)
Platelets: 261 10*3/uL (ref 150–400)
RBC: 3.74 MIL/uL — ABNORMAL LOW (ref 3.87–5.11)
RDW: 11.5 % (ref 11.5–15.5)
WBC: 4.9 10*3/uL (ref 4.0–10.5)

## 2014-07-07 LAB — I-STAT CHEM 8, ED
BUN: 18 mg/dL (ref 6–23)
BUN: 16 mg/dL (ref 6–23)
Calcium, Ion: 1.17 mmol/L (ref 1.13–1.30)
Calcium, Ion: 1.19 mmol/L (ref 1.13–1.30)
Chloride: 97 mmol/L (ref 96–112)
Chloride: 95 mmol/L — ABNORMAL LOW (ref 96–112)
Creatinine, Ser: 0.7 mg/dL (ref 0.50–1.10)
Creatinine, Ser: 0.7 mg/dL (ref 0.50–1.10)
Glucose, Bld: 95 mg/dL (ref 70–99)
Glucose, Bld: 112 mg/dL — ABNORMAL HIGH (ref 70–99)
HCT: 42 % (ref 36.0–46.0)
HCT: 38 % (ref 36.0–46.0)
Hemoglobin: 14.3 g/dL (ref 12.0–15.0)
Hemoglobin: 12.9 g/dL (ref 12.0–15.0)
Potassium: 4.2 mmol/L (ref 3.5–5.1)
Potassium: 4.4 mmol/L (ref 3.5–5.1)
Sodium: 133 mmol/L — ABNORMAL LOW (ref 135–145)
Sodium: 133 mmol/L — ABNORMAL LOW (ref 135–145)
TCO2: 24 mmol/L (ref 0–100)
TCO2: 24 mmol/L (ref 0–100)

## 2014-07-07 LAB — URINE MICROSCOPIC-ADD ON

## 2014-07-07 LAB — COMPREHENSIVE METABOLIC PANEL
ALT: 14 U/L (ref 0–35)
AST: 23 U/L (ref 0–37)
Albumin: 3.2 g/dL — ABNORMAL LOW (ref 3.5–5.2)
Alkaline Phosphatase: 58 U/L (ref 39–117)
Anion gap: 8 (ref 5–15)
BUN: 15 mg/dL (ref 6–23)
CO2: 28 mmol/L (ref 19–32)
Calcium: 8.8 mg/dL (ref 8.4–10.5)
Chloride: 96 mmol/L (ref 96–112)
Creatinine, Ser: 0.77 mg/dL (ref 0.50–1.10)
GFR calc Af Amer: >90 mL/min (ref >90)
GFR calc non Af Amer: 78 mL/min — ABNORMAL LOW (ref >90)
Glucose, Bld: 112 mg/dL — ABNORMAL HIGH (ref 70–99)
Potassium: 4.3 mmol/L (ref 3.5–5.1)
Sodium: 132 mmol/L — ABNORMAL LOW (ref 135–145)
Total Bilirubin: 0.5 mg/dL (ref 0.3–1.2)
Total Protein: 6.2 g/dL (ref 6.0–8.3)

## 2014-07-07 LAB — CBC
HCT: 37.9 % (ref 36.0–46.0)
Hemoglobin: 12.8 g/dL (ref 12.0–15.0)
MCH: 32.5 pg (ref 26.0–34.0)
MCHC: 33.8 g/dL (ref 30.0–36.0)
MCV: 96.2 fL (ref 78.0–100.0)
Platelets: 266 10*3/uL (ref 150–400)
RBC: 3.94 MIL/uL (ref 3.87–5.11)
RDW: 11.5 % (ref 11.5–15.5)
WBC: 4.8 10*3/uL (ref 4.0–10.5)

## 2014-07-07 LAB — RAPID URINE DRUG SCREEN, HOSP PERFORMED
Amphetamines: NOT DETECTED
Barbiturates: POSITIVE — AB
Benzodiazepines: NOT DETECTED
Cocaine: NOT DETECTED
Opiates: POSITIVE — AB
Tetrahydrocannabinol: NOT DETECTED

## 2014-07-07 LAB — I-STAT TROPONIN, ED: Troponin i, poc: 0 ng/mL (ref 0.00–0.08)

## 2014-07-07 LAB — LACTIC ACID, PLASMA: Lactic Acid, Venous: 1.3 mmol/L (ref 0.5–2.0)

## 2014-07-07 LAB — CK TOTAL AND CKMB (NOT AT ARMC)
CK, MB: 1.3 ng/mL (ref 0.3–4.0)
Relative Index: INVALID (ref 0.0–2.5)
Total CK: 41 U/L (ref 7–177)

## 2014-07-07 LAB — D-DIMER, QUANTITATIVE (NOT AT ARMC): D DIMER QUANT: 0.62 ug{FEU}/mL — AB (ref 0.00–0.48)

## 2014-07-07 LAB — ETHANOL: Alcohol, Ethyl (B): <5 mg/dL (ref 0–9)

## 2014-07-07 LAB — APTT: aPTT: 34 seconds (ref 24–37)

## 2014-07-07 LAB — PROTIME-INR
INR: 1.31 (ref 0.00–1.49)
Prothrombin Time: 16.4 seconds — ABNORMAL HIGH (ref 11.6–15.2)

## 2014-07-07 MED ORDER — RIVAROXABAN 20 MG PO TABS
20.0000 mg | ORAL_TABLET | Freq: Every day | ORAL | Status: DC
  Administered 2014-07-07 – 2014-07-08 (×2): 20 mg via ORAL
  Filled 2014-07-07 (×2): qty 1

## 2014-07-07 MED ORDER — SODIUM CHLORIDE 0.9 % IV SOLN: INTRAVENOUS | Status: DC

## 2014-07-07 MED ORDER — SODIUM CHLORIDE 0.9 % IV BOLUS (SEPSIS)
1000.0000 mL | Freq: Once | INTRAVENOUS | Status: AC
  Administered 2014-07-07: 1000 mL via INTRAVENOUS

## 2014-07-07 MED ORDER — DIPHENHYDRAMINE HCL 25 MG PO CAPS
50.0000 mg | ORAL_CAPSULE | Freq: Every evening | ORAL | Status: DC | PRN
  Filled 2014-07-07: qty 2

## 2014-07-07 MED ORDER — LEVOFLOXACIN 750 MG PO TABS
750.0000 mg | ORAL_TABLET | Freq: Every day | ORAL | Status: DC
  Administered 2014-07-07 – 2014-07-08 (×2): 750 mg via ORAL
  Filled 2014-07-07 (×2): qty 1

## 2014-07-07 MED ORDER — LORATADINE 10 MG PO TABS
10.0000 mg | ORAL_TABLET | Freq: Every day | ORAL | Status: DC
  Administered 2014-07-08 – 2014-07-09 (×2): 10 mg via ORAL
  Filled 2014-07-07 (×2): qty 1

## 2014-07-07 MED ORDER — LISINOPRIL 10 MG PO TABS: 10.0000 mg | ORAL_TABLET | Freq: Every day | ORAL | Status: DC

## 2014-07-07 MED ORDER — SODIUM CHLORIDE 0.9 % IJ SOLN
3.0000 mL | Freq: Two times a day (BID) | INTRAMUSCULAR | Status: DC
  Administered 2014-07-07 – 2014-07-08 (×2): 3 mL via INTRAVENOUS

## 2014-07-07 MED ORDER — ALBUTEROL SULFATE (2.5 MG/3ML) 0.083% IN NEBU: 3.0000 mL | INHALATION_SOLUTION | Freq: Four times a day (QID) | RESPIRATORY_TRACT | Status: DC | PRN

## 2014-07-07 MED ORDER — ATENOLOL 25 MG PO TABS
25.0000 mg | ORAL_TABLET | Freq: Two times a day (BID) | ORAL | Status: DC
  Administered 2014-07-08 – 2014-07-09 (×3): 25 mg via ORAL
  Filled 2014-07-07 (×3): qty 1

## 2014-07-07 MED ORDER — DIPHENHYDRAMINE HCL (SLEEP) 25 MG PO TABS: 50.0000 mg | ORAL_TABLET | Freq: Every evening | ORAL | Status: DC | PRN

## 2014-07-07 MED ORDER — SIMVASTATIN 40 MG PO TABS
40.0000 mg | ORAL_TABLET | Freq: Every day | ORAL | Status: DC
  Administered 2014-07-08 – 2014-07-09 (×2): 40 mg via ORAL
  Filled 2014-07-07 (×2): qty 1

## 2014-07-07 MED ORDER — SODIUM CHLORIDE 0.9 % IV SOLN
INTRAVENOUS | Status: DC
  Administered 2014-07-07: 18:00:00 via INTRAVENOUS
  Administered 2014-07-08: 100 mL/h via INTRAVENOUS
  Administered 2014-07-09: via INTRAVENOUS

## 2014-07-07 MED ORDER — FERROUS SULFATE 325 (65 FE) MG PO TABS
325.0000 mg | ORAL_TABLET | Freq: Every day | ORAL | Status: DC
  Administered 2014-07-08 – 2014-07-09 (×2): 325 mg via ORAL
  Filled 2014-07-07 (×2): qty 1

## 2014-07-07 MED ORDER — FLUOXETINE HCL 20 MG PO CAPS
20.0000 mg | ORAL_CAPSULE | Freq: Every day | ORAL | Status: DC
  Administered 2014-07-08 – 2014-07-09 (×2): 20 mg via ORAL
  Filled 2014-07-07 (×2): qty 1

## 2014-07-07 NOTE — ED Provider Notes (Signed)
CSN: [id number]     Arrival date & time 07/07/14  1047 History   First MD Initiated Contact with Patient 07/07/14 1105     Chief Complaint  Patient presents with  . Near Syncope   HPI  81 year old female presents today with listlessness that came on suddenly this morning. She has a significant past medical history of A. fib, hemorrhagic stroke, and recent upper respiratory infection. Patient reports that she was carrying on normal activities this morning getting some coffee she immediately felt weak and slumped backwards. She reports that her grandson was there and caught her and got her to chair without injury. At that time family notes that she sat slumped in the chair was very fatigued, and was slow to respond. Patient's daughter didn't stroke screen on her with no focal deficits at that time. She was immediately brought to the emergency room for further evaluation. Patient denies chest pain, shortness of breath, headache or any other complaints in addition to the fatigue. Family notes that she is "not herself" but denies any focal changes in her speech, behavior.  Patient and her daughter report of history of upper respiratory infection for which she was seen by her primary care. Chest x-ray was negative at that time. She reports that Wednesday Thursday and Friday of this week she was feeling significantly better and was carrying on her normal daily activities without difficulty. They note that when she was seen by her primary care her heart rate was elevated into 130s, for which her tunnel was increased from 25 twice a day to 50 twice a day. That change was made 6 days prior to today's evaluation.  Patient denies fever, shortness breath, chest pain, palpitations, headache, abdominal pain, nausea, vomiting, diarrhea, changes in bowel or bladder frequency functioning or characteristics.  Patient notes she is otherwise healthy with her only major medical surgery is being hypertension, A. fib, history  of stroke. Patient reports she takes 20 mg of Xarelto daily for stroke prevention. She reports she has been taking her medication, eating and drinking adequate amounts of liquids and foods.   Past Medical History  Diagnosis Date  . Anxiety   . Cerebral infarction   . A-fib   . Hypertension   . Tinea unguium   . Hyperlipemia   . Tremor    Past Surgical History  Procedure Laterality Date  . Carpal tunnel release Left   . Total abdominal hysterectomy    . Foot surgery Right   . Bladder surgery    . Hernia repair    . Nasal sinus surgery     Family History  Problem Relation Age of Onset  . Osteoporosis Mother   . Emphysema Father    History  Substance Use Topics  . Smoking status: Never Smoker   . Smokeless tobacco: Not on file  . Alcohol Use: No   OB History    No data available     Review of Systems  All other systems reviewed and are negative.  Allergies  Tetanus toxoids  Home Medications   Prior to Admission medications   Medication Sig Start Date End Date Taking? Authorizing Provider  albuterol (ACCUNEB) 0.63 MG/3ML nebulizer solution Take 3 mLs by nebulization every 6 (six) hours as needed for wheezing or shortness of breath.  07/03/14  Yes Historical Provider, MD  alendronate (FOSAMAX) 70 MG tablet Take 70 mg by mouth once a week. Take with a full glass of water on an empty stomach on Tuesday  Yes Historical Provider, MD  atenolol (TENORMIN) 25 MG tablet Take 50 mg by mouth 2 (two) times daily.    Yes Historical Provider, MD  B Complex Vitamins (VITAMIN B COMPLEX PO) Take 1 tablet by mouth daily.    Yes Historical Provider, MD  Calcium Carb-Cholecalciferol (CALCIUM + D3) 600-200 MG-UNIT TABS Take 1-2 tablets by mouth 2 (two) times daily. 2 tablets in the morning and 1 tablet in the evening.   Yes Historical Provider, MD  diphenhydrAMINE (SOMINEX) 25 MG tablet Take 50 mg by mouth at bedtime.    Yes Historical Provider, MD  ferrous sulfate 325 (65 FE) MG tablet  Take 325 mg by mouth daily with breakfast.   Yes Historical Provider, MD  Flaxseed, Linseed, (EQL FLAX SEED OIL) 1000 MG CAPS Take 1,000 mg by mouth daily.    Yes Historical Provider, MD  FLUoxetine (PROZAC) 20 MG capsule Take 20 mg by mouth daily.   Yes Historical Provider, MD  levofloxacin (LEVAQUIN) 750 MG tablet Take 750 mg by mouth daily with supper.  07/02/14  Yes Historical Provider, MD  lisinopril (PRINIVIL,ZESTRIL) 20 MG tablet Take 10 mg by mouth daily.    Yes Historical Provider, MD  loratadine (CLARITIN) 10 MG tablet Take 10 mg by mouth daily.   Yes Historical Provider, MD  Melatonin 10 MG TABS Take 10 mg by mouth at bedtime.    Yes Historical Provider, MD  Multiple Vitamin (MULTIVITAMIN) capsule Take 1 capsule by mouth daily.   Yes Historical Provider, MD  primidone (MYSOLINE) 250 MG tablet Take 250 mg by mouth 2 (two) times daily.    Yes Historical Provider, MD  rivaroxaban (XARELTO) 20 MG TABS tablet Take 20 mg by mouth daily with supper.   Yes Historical Provider, MD  simvastatin (ZOCOR) 40 MG tablet Take 40 mg by mouth daily.   Yes Historical Provider, MD   BP 93/57 mmHg  Pulse 76  Resp 20  Ht 5\' 7"  (1.702 m)  Wt 126 lb (57.153 kg)  BMI 19.73 kg/m2  SpO2 98%    Physical Exam  Constitutional: She is oriented to person, place, and time. She appears well-developed and well-nourished. She appears listless.  HENT:  Head: Normocephalic and atraumatic.  Eyes: Pupils are equal, round, and reactive to light.  Neck: Normal range of motion. Neck supple. No JVD present. No tracheal deviation present. No thyromegaly present.  Cardiovascular: Normal heart sounds and intact distal pulses.  Exam reveals no gallop and no friction rub.   No murmur heard. Pulmonary/Chest: Effort normal and breath sounds normal. No stridor. No respiratory distress. She has no wheezes. She has no rales. She exhibits no tenderness.  Abdominal: Soft. Bowel sounds are normal. She exhibits no distension and no  mass. There is no tenderness. There is no rebound and no guarding.  Musculoskeletal: Normal range of motion.  Lymphadenopathy:    She has no cervical adenopathy.  Neurological: She is oriented to person, place, and time. She has normal strength. She appears listless. She displays tremor. She displays no atrophy. No cranial nerve deficit or sensory deficit. She exhibits normal muscle tone. She displays no seizure activity. Coordination normal. GCS eye subscore is 4. GCS verbal subscore is 5. GCS motor subscore is 6.  Skin: Skin is warm and dry.  Psychiatric: She has a normal mood and affect. Her behavior is normal. Judgment and thought content normal.  Listlessness  Nursing note and vitals reviewed.   ED Course  Procedures (including critical care time) Labs  Review Labs Reviewed  I-STAT CHEM 8, ED - Abnormal; Notable for the following:    Sodium 133 (*)    All other components within normal limits  CBC  LACTIC ACID, PLASMA  LACTIC ACID, PLASMA  URINALYSIS, ROUTINE W REFLEX MICROSCOPIC  ETHANOL  PROTIME-INR  APTT  CBC  DIFFERENTIAL  COMPREHENSIVE METABOLIC PANEL  URINE RAPID DRUG SCREEN (HOSP PERFORMED)  URINALYSIS, ROUTINE W REFLEX MICROSCOPIC  CBC WITH DIFFERENTIAL/PLATELET  CK TOTAL AND CKMB  I-STAT CHEM 8, ED  I-STAT TROPOININ, ED  I-STAT TROPOININ, ED    Imaging Review No results found.   EKG Interpretation   Date/Time:  Saturday July 07 2014 10:57:48 EDT Ventricular Rate:  94 PR Interval:    QRS Duration: 81 QT Interval:  348 QTC Calculation: 435 R Axis:   66 Text Interpretation:  Atrial fibrillation Anterior infarct, age  indeterminate Baseline wander in lead(s) V4 V6 Atrial fibrillation  Artifact Abnormal ekg Confirmed by Carmin Muskrat  MD [phone]) on 07/07/2014  11:17:27 AM      MDM   Final diagnoses:  Near syncope    Labs: I-STAT Chem-8, CBC, urinalysis, ethanol, troponin, PT/INR, APTT, CMP, urine drug screen, lactic acid  Imaging: MRI- no  acute infarct  Consults: Medicine- Dr. Renne Crigler   Therapeutics: Fluids  Assessment/Plan: Patient's presentation unlikely due to acute infarct, pulmonary embolism, sepsis.  MRI scan shows no acute infarct, patient denies respiratory complaints including shortness of breath chest pain, dizziness. She has no history of prior DVTs, PEs, she is currently taking Xarelto, no history of recent surgeries or prolonged immobilization, no history of cancer. She is low risk. Patient's vital signs although lower than normal remained stable throughout her stay, lactic acid was negative no significant findings on any of the other labs. Troponin negative. Pt has a history of A-Fib and recently seen for tachycardia. Pt's atenolol was increased on 6 days ago from 25 BID to 50 BID. Also she was started on Levaquin recently for URI.  Patient's presentation unlikely for the above, more likely to be related to recent change in her blood pressure medication. It is my opinion that she should be best served with evaluation in the hospital setting and management of blood pressure medication before being released home. If it hadn't been for her grandson standing behind her today she might have suffered serious injuries.  Pt care was shared with Dr. Vanita Panda who spoke with Dr.Gherge with [facility] hospital[facility]. He agreed to admit for near syncope.       Okey Regal, PA-C 07/07/14 [place], MD 07/08/14 [phone]

## 2014-07-07 NOTE — ED Notes (Signed)
Patient from home was carrying out normal morning activities and had not had anything to eat, patient then felt self get weak. Grandson assisted patient to chair with no injury. Patient sat slumped in chair with generalizes weakness for a few more minutes but was verbally responding. Patient has had some generalized weakness over the past week. Family then gave patient ensure, and completed BEFAST exam that was negative. Patient alert and oriented x4 at present time.

## 2014-07-07 NOTE — H&P (Signed)
History and Physical    Megan Munoz NID:[id number] DOB: 1932/09/16 DOA: 07/07/2014  Referring physician: EDP PCP: [name], MD  Specialists: none  Chief Complaint: pre-syncope  HPI: Megan Munoz is a 81 y.o. female has a past medical history significant for A. fib, hypertension, hyperlipidemia, presents to the emergency room with a chief complaint of breath syncopal episode. Patient has been having a recent upper respiratory infection for which she was evaluated in her primary care doctor's office few days ago. At that time, she was found to have an elevated heart rate of 130, and her atenolol was increased from 25 mg twice daily to 50 mg twice daily. That was about 6 days ago. It appears that she was also placed on levofloxacin. This morning, her family felt like she was not herself, as she did not follow her regular routine. In the morning she got some coffee, immediately felt weak and appeared to be with decreased responsiveness. She did not fall and was directed to the chair, and appears to be very fatigued and very slow to respond. Per family there was no loss of consciousness or any other focal deficits, no slurred speech. Patient is feeling back to baseline in the emergency room, she is quite emotional about the fact that she is having to stay. She denies any chest pain, denies any respiratory problems, she denies any abdominal pain, nausea, vomiting or diarrhea. She denies any fever or chills. In the emergency room, patient's vital signs are stable, she is normotensive EKG shows rate-controlled atrial fibrillation. blood work is fairly unremarkable normal renal function and normal CBC. TR[facility] asked for admission for presyncopal episode.   Review of Systems: As per history of present illness, otherwise 10 point review of systems negative  Past Medical History  Diagnosis Date  . Anxiety   . Cerebral infarction   . A-fib   . Hypertension   . Tinea unguium   . Hyperlipemia   . Tremor     Past Surgical History  Procedure Laterality Date  . Carpal tunnel release Left   . Total abdominal hysterectomy    . Foot surgery Right   . Bladder surgery    . Hernia repair    . Nasal sinus surgery     Social History:  reports that she has never smoked. She does not have any smokeless tobacco history on file. She reports that she does not drink alcohol or use illicit drugs.  Allergies  Allergen Reactions  . Tetanus Toxoids     Family History  Problem Relation Age of Onset  . Osteoporosis Mother   . Emphysema Father     Prior to Admission medications   Medication Sig Start Date End Date Taking? Authorizing Provider  albuterol (ACCUNEB) 0.63 MG/3ML nebulizer solution Take 3 mLs by nebulization every 6 (six) hours as needed for wheezing or shortness of breath.  07/03/14  Yes Historical Provider, MD  alendronate (FOSAMAX) 70 MG tablet Take 70 mg by mouth once a week. Take with a full glass of water on an empty stomach on Tuesday   Yes Historical Provider, MD  atenolol (TENORMIN) 25 MG tablet Take 50 mg by mouth 2 (two) times daily.    Yes Historical Provider, MD  B Complex Vitamins (VITAMIN B COMPLEX PO) Take 1 tablet by mouth daily.    Yes Historical Provider, MD  Calcium Carb-Cholecalciferol (CALCIUM + D3) 600-200 MG-UNIT TABS Take 1-2 tablets by mouth 2 (two) times daily. 2 tablets in the morning and 1  tablet in the evening.   Yes Historical Provider, MD  diphenhydrAMINE (SOMINEX) 25 MG tablet Take 50 mg by mouth at bedtime.    Yes Historical Provider, MD  ferrous sulfate 325 (65 FE) MG tablet Take 325 mg by mouth daily with breakfast.   Yes Historical Provider, MD  Flaxseed, Linseed, (EQL FLAX SEED OIL) 1000 MG CAPS Take 1,000 mg by mouth daily.    Yes Historical Provider, MD  FLUoxetine (PROZAC) 20 MG capsule Take 20 mg by mouth daily.   Yes Historical Provider, MD  levofloxacin (LEVAQUIN) 750 MG tablet Take 750 mg by mouth daily with supper.  07/02/14  Yes Historical Provider, MD   lisinopril (PRINIVIL,ZESTRIL) 20 MG tablet Take 10 mg by mouth daily.    Yes Historical Provider, MD  loratadine (CLARITIN) 10 MG tablet Take 10 mg by mouth daily.   Yes Historical Provider, MD  Melatonin 10 MG TABS Take 10 mg by mouth at bedtime.    Yes Historical Provider, MD  Multiple Vitamin (MULTIVITAMIN) capsule Take 1 capsule by mouth daily.   Yes Historical Provider, MD  primidone (MYSOLINE) 250 MG tablet Take 250 mg by mouth 2 (two) times daily.    Yes Historical Provider, MD  rivaroxaban (XARELTO) 20 MG TABS tablet Take 20 mg by mouth daily with supper.   Yes Historical Provider, MD  simvastatin (ZOCOR) 40 MG tablet Take 40 mg by mouth daily.   Yes Historical Provider, MD   Physical Exam: Filed Vitals:   07/07/14 1414 07/07/14 1430 07/07/14 1452 07/07/14 1500  BP: 95/53 110/60 110/60 94/65  Pulse: 72 74 84 81  Temp:      TempSrc:      Resp: 16 21 17 20   Height:      Weight:      SpO2: 98% 97% 98% 99%     General:  No apparent distress  Eyes: no scleral icterus  ENT: moist oropharynx  Neck: supple, no lymphadenopathy  Cardiovascular: irregular, without MRG; 2+ peripheral pulses, no JVD, no peripheral edema  Respiratory: CTA biL, good air movement without wheezing, rhonchi or crackles  Abdomen: soft, non tender to palpation, positive bowel sounds, no guarding, no rebound  Skin: no rashes  Musculoskeletal: normal bulk and tone, no joint swelling  Psychiatric: normal mood and affect  Neurologic: non focal    Labs on Admission:  Basic Metabolic Panel:  Recent Labs Lab 07/07/14 1125 07/07/14 1155 07/07/14 1209  NA 133* 132* 133*  K 4.2 4.3 4.4  CL 97 96 95*  CO2  --  28  --   GLUCOSE 95 112* 112*  BUN 18 15 16   CREATININE 0.70 0.77 0.70  CALCIUM  --  8.8  --    Liver Function Tests:  Recent Labs Lab 07/07/14 1155  AST 23  ALT 14  ALKPHOS 58  BILITOT 0.5  PROT 6.2  ALBUMIN 3.2*   CBC:  Recent Labs Lab 07/07/14 1115 07/07/14 1125  07/07/14 1155 07/07/14 1209  WBC 4.8  --  4.9  --   NEUTROABS  --   --  3.4  --   HGB 12.8 14.3 12.2 12.9  HCT 37.9 42.0 35.9* 38.0  MCV 96.2  --  96.0  --   PLT 266  --  261  --    Cardiac Enzymes:  Recent Labs Lab 07/07/14 1155  CKTOTAL 41  CKMB 1.3   Radiological Exams on Admission: Dg Chest 2 View  07/07/2014   CLINICAL DATA:  Near syncopal  episode. History of atrial fibrillation and hypertension.  EXAM: CHEST  2 VIEW  COMPARISON:  07/02/2014  FINDINGS: Grossly unchanged borderline enlarged cardiac silhouette. Normal mediastinal contours. Atherosclerotic plaque within the thoracic aorta. There is grossly unchanged apical predominant slightly nodular interstitial thickening, left greater right. Lungs remain hyperexpanded with flattening the bilateral main diaphragms. There is unchanged pleural pleural parenchymal thickening about the bilateral major and the right minor fissures. No pleural effusion or pneumothorax. No evidence of edema. No acute osseus abnormalities.  IMPRESSION: 1. Similar findings of lung hyperexpansion and apical predominant bronchitic change without definite superimposed acute cardiopulmonary disease. 2. Borderline cardiomegaly without evidence of edema.   Electronically Signed   By: Sandi Mariscal M.D.   On: 07/07/2014 13:51   Mr Brain Wo Contrast  07/07/2014   CLINICAL DATA:  81 year old hypertensive female with hyperlipidemia and atrial fibrillation presenting with generalized weakness. Initial encounter.  EXAM: MRI HEAD WITHOUT CONTRAST  TECHNIQUE: Multiplanar, multiecho pulse sequences of the brain and surrounding structures were obtained without intravenous contrast.  COMPARISON:  None.  FINDINGS: No acute infarct.  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  Mild small vessel disease type changes.  No intracranial hemorrhage.  Global atrophy. Ventricular prominence felt to be  related to atrophy and remote infarcts without underlying hydrocephalus.  No intracranial mass lesion noted on this unenhanced exam.  Major intracranial vascular structures are patent.  Mild transverse ligament hypertrophy. Mild cervical spondylotic changes C3-4. Cervical medullary junction, pituitary region, pineal region and orbital structures unremarkable.  IMPRESSION: No acute infarct.  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  Mild small vessel disease type changes.  No intracranial hemorrhage.  Global atrophy.   Electronically Signed   By: Genia Del M.D.   On: 07/07/2014 12:49   EKG: Independently reviewed. Atrial fibrillation  Assessment/Plan Principal Problem:   Pre-syncope Active Problems:   Near syncope   Atrial fibrillation   Hyperlipidemia    Pre-syncopal episode - likely multifactorial, patient with recent upper respiratory infection with poor by mouth intake noted last few days, may be somewhat dehydrated, also her atenolol was increased from 25 to 50 twice a day. Should couple of blood pressure readings in the emergency room in the 81X systolic, she may need her atenolol reduced back down to 25. She just took this medication prior to arriving to the emergency room, will hold for tonight, monitor on telemetry and restart atenolol at a lower dose of 25 mg in the morning.   Atrial fibrillation - she is currently rate controlled with rates in the 80s to 90s, continue to monitor on telemetry, resume atenolol tomorrow. Continue Xarelto. she currently does not have a cardiologist in the area as she relocated here last year, she was see Dr. Ellyn Hack in about 8 days.   Recent upper respiratory infection - continue levofloxacin while hospitalized.  HLD - continue statin   Diet: heart healthy Fluids: NS DVT Prophylaxis: on Xarelto   Code Status: Full  Family Communication: d/w daughter  bedside  Disposition Plan: admit to telemetry    Jolynne Spurgin M. Cruzita Lederer, MD [facility] Pager [phone]  If 7PM-7AM, please contact night-coverage [email or web address] Password [facility] 07/07/2014, 4:44 PM

## 2014-07-08 DIAGNOSIS — E785 Hyperlipidemia, unspecified: Secondary | ICD-10-CM | POA: Diagnosis not present

## 2014-07-08 DIAGNOSIS — I482 Chronic atrial fibrillation: Secondary | ICD-10-CM | POA: Diagnosis not present

## 2014-07-08 DIAGNOSIS — R55 Syncope and collapse: Secondary | ICD-10-CM | POA: Diagnosis not present

## 2014-07-08 LAB — CBC
HEMATOCRIT: 35.8 % — AB (ref 36.0–46.0)
Hemoglobin: 12.1 g/dL (ref 12.0–15.0)
MCH: 32.6 pg (ref 26.0–34.0)
MCHC: 33.8 g/dL (ref 30.0–36.0)
MCV: 96.5 fL (ref 78.0–100.0)
PLATELETS: 262 10*3/uL (ref 150–400)
RBC: 3.71 MIL/uL — ABNORMAL LOW (ref 3.87–5.11)
RDW: 11.5 % (ref 11.5–15.5)
WBC: 5.4 10*3/uL (ref 4.0–10.5)

## 2014-07-08 LAB — BASIC METABOLIC PANEL
ANION GAP: 8 (ref 5–15)
BUN: 10 mg/dL (ref 6–23)
CALCIUM: 8.4 mg/dL (ref 8.4–10.5)
CO2: 23 mmol/L (ref 19–32)
Chloride: 102 mmol/L (ref 96–112)
Creatinine, Ser: 0.6 mg/dL (ref 0.50–1.10)
GFR, EST NON AFRICAN AMERICAN: 84 mL/min — AB (ref >90)
GLUCOSE: 120 mg/dL — AB (ref 70–99)
POTASSIUM: 3.8 mmol/L (ref 3.5–5.1)
Sodium: 133 mmol/L — ABNORMAL LOW (ref 135–145)

## 2014-07-08 MED ORDER — ZOLPIDEM TARTRATE 5 MG PO TABS
5.0000 mg | ORAL_TABLET | Freq: Every evening | ORAL | Status: DC | PRN
  Administered 2014-07-08: 5 mg via ORAL
  Filled 2014-07-08: qty 1

## 2014-07-08 NOTE — Evaluation (Signed)
Physical Therapy Evaluation Patient Details Name: Megan Munoz MRN: [id number] DOB: 28-Sep-1932 Today's Date: 07/08/2014   History of Present Illness    81 y.o. female has a past medical history significant for A. fib, hypertension, hyperlipidemia, presents to the emergency room with a chief complaint of breath syncopal episode. Patient has been having a recent upper respiratory infection for which she was evaluated in her primary care doctor's office few days ago. At that time, she was found to have an elevated heart rate of 130, and her atenolol was increased from 25 mg twice daily to 50 mg twice daily. That was about 6 days ago. It appears that she was also placed on levofloxacin. This morning, her family felt like she was not herself, as she did not follow her regular routine. In the morning she got some coffee, immediately felt weak and appeared to be with decreased responsiveness. She did not fall and was directed to the chair, and appears to be very fatigued and very slow to respond. Per family there was no loss of consciousness or any other focal deficits, no slurred speech. Patient is feeling back to baseline in the emergency room, she is quite emotional about the fact that she is having to stay. She denies any chest pain, denies any respiratory problems, she denies any abdominal pain, nausea, vomiting or diarrhea. She denies any fever or chills. In the emergency room, patient's vital signs are stable, she is normotensive EKG shows rate-controlled atrial fibrillation. blood work is fairly unremarkable normal renal function and normal CBC. TRH asked for admission for presyncopal episode    Clinical Impression  Pt presents at/near baseline for functional mobility and expect return to full independence as natural course of recovery from medical issues.  No s/s syncope, BP 108/63 sitting, 113/70 standing, 125/58 after walking 300 feet.  Noted HR variable to >125 with ambulation, pt reports no s/s.  Does  present with mild balance deficits that could increase risk for fall at home and discussed with pt importance of daily activity (walking, exercise class) to maintain strength and balance ability.  Recommend discuss with MD OPPT to further assess and treat any balance deficits.  No other PT needs at this time, will sign off. Thanks.   Follow Up Recommendations Outpatient PT    Equipment Recommendations  None recommended by PT    Recommendations for Other Services       Precautions / Restrictions Precautions Precautions: Fall Precaution Comments: walk with supervision      Mobility  Bed Mobility Overal bed mobility: Modified Independent             General bed mobility comments: repositions and transitions with incr time and/or rail but no physical assist or cues  Transfers Overall transfer level: Modified independent Equipment used: None             General transfer comment: slow and cautious  Ambulation/Gait Ambulation/Gait assistance: Supervision Ambulation Distance (Feet): 300 Feet Assistive device: None Gait Pattern/deviations: Step-through pattern;Decreased stride length;Narrow base of support;Trunk flexed Gait velocity: decr Gait velocity interpretation: Below normal speed for age/gender General Gait Details: generally guarded but pt denies worry or concern  Stairs            Wheelchair Mobility    Modified Rankin (Stroke Patients Only)       Balance Overall balance assessment: Modified Independent  Standardized Balance Assessment Standardized Balance Assessment : Dynamic Gait Index   Dynamic Gait Index Level Surface: Mild Impairment Change in Gait Speed: Normal Gait with Horizontal Head Turns: Mild Impairment Gait with Vertical Head Turns: Mild Impairment Gait and Pivot Turn: Mild Impairment Step Over Obstacle: Mild Impairment Step Around Obstacles: Normal Steps: Mild Impairment Total Score:  18       Pertinent Vitals/Pain Pain Assessment: No/denies pain    Home Living Family/patient expects to be discharged to:: Private residence Living Arrangements: Children Available Help at Discharge: Family;Available PRN/intermittently Type of Home: House Home Access: Stairs to enter   [employer] of Steps: 2 Home Layout: Two level Home Equipment: None      Prior Function Level of Independence: Independent         Comments: 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 86-16 yo     Hand Dominance        Extremity/Trunk Assessment   Upper Extremity Assessment: Overall WFL for tasks assessed           Lower Extremity Assessment: Overall WFL for tasks assessed      Cervical / Trunk Assessment: Normal  Communication   Communication: No difficulties  Cognition Arousal/Alertness: Awake/alert Behavior During Therapy: WFL for tasks assessed/performed Overall Cognitive Status: Within Functional Limits for tasks assessed                      General Comments General comments (skin integrity, edema, etc.): thin    Exercises        Assessment/Plan    PT Assessment All further PT needs can be met in the next venue of care  PT Diagnosis Difficulty walking   PT Problem List Cardiopulmonary status limiting activity;Decreased balance;Decreased mobility  PT Treatment Interventions     PT Goals (Current goals can be found in the Care Plan section) Acute Rehab PT Goals PT Goal Formulation: All assessment and education complete, DC therapy    Frequency     Barriers to discharge        Co-evaluation               End of Session Equipment Utilized During Treatment: Gait belt Activity Tolerance: Patient tolerated treatment well Patient left: in bed;with call bell/phone within reach Nurse Communication: Mobility status    Functional Assessment Tool Used: DGI Functional Limitation: Mobility: Walking and moving around Mobility: Walking and Moving  Around Current Status (N3976): At least 20 percent but less than 40 percent impaired, limited or restricted Mobility: Walking and Moving Around Goal Status [phone]): At least 1 percent but less than 20 percent impaired, limited or restricted Mobility: Walking and Moving Around Discharge Status [phone]): At least 20 percent but less than 40 percent impaired, limited or restricted    Time: 4097-3532 PT Time Calculation (min) (ACUTE ONLY): 31 min   Charges:   PT Evaluation $Initial PT Evaluation Tier I: 1 Procedure PT Treatments $Physical Performance Test: 8-22 mins   PT G Codes:   PT G-Codes **NOT FOR INPATIENT CLASS** Functional Assessment Tool Used: DGI Functional Limitation: Mobility: Walking and moving around Mobility: Walking and Moving Around Current Status (D9242): At least 20 percent but less than 40 percent impaired, limited or restricted Mobility: Walking and Moving Around Goal Status [phone]): At least 1 percent but less than 20 percent impaired, limited or restricted Mobility: Walking and Moving Around Discharge Status [phone]): At least 20 percent but less than 40 percent impaired, limited or restricted    Hassell Done,  Felizardo Hoffmann 07/08/2014, 3:38 PM

## 2014-07-08 NOTE — Progress Notes (Signed)
UR completed 

## 2014-07-08 NOTE — Progress Notes (Signed)
[facility] PROGRESS NOTE  Megan Munoz BMW:[id number] DOB: 1933/01/07 DOA: 07/07/2014 PCP: [name], MD  Assessment/Plan: #1 presyncopal episode Likely multifactorial secondary to volume depletion secondary to decreased oral intake, recent upper respiratory infection/questionable pneumonia and increased beta blocker dose. Patient's blood pressure has been borderline. Patient is in place on IV fluids. Clinical improvement. MRI head negative. Patient's atenolol has been decreased back to her prior dose of 25 mg twice a day. Patient with some clinical improvement. Continue IV fluids. Hold lisinopril and Imdur. Follow.  #2 atrial fibrillation Rate has been controlled. Atenolol has been resumed. Continue xarelto. Patient is to follow-up with Dr. Ellyn Hack in approximately a week.  #3 recent upper respiratory infection/?? Pneumonia Per family patient had upper respiratory symptoms and was placed empirically on Levaquin to treat a presumed pneumonia. Patient with clinical improvement. Follow.  #4 hyperlipidemia Continued on home regimen of statin.  The rest of patient's chronic medical issues remained stable throughout the hospitalization.  Code Status: Full Family Communication: Updated patient and son-in-law at bedside. Disposition Plan: Home when blood pressure has improved.   Consultants:  None  Procedures:  MRI head 07/07/2014  Chest x-ray 07/07/2014  Antibiotics:  Oral Levaquin started prior to admission.  HPI/Subjective: Patient states she's feeling better. Patient denies any chest pain. Patient states more steady on her legs today.  Objective: Filed Vitals:   07/08/14 0406  BP: 101/57  Pulse: 60  Temp: 98.1 F (36.7 C)  Resp: 20    Intake/Output Summary (Last 24 hours) at 07/08/14 1237 Last data filed at 07/08/14 0935  Gross per 24 hour  Intake 1306.25 ml  Output   2350 ml  Net -1043.75 ml   Filed Weights   07/07/14 1101 07/07/14 1800  Weight:  57.153 kg (126 lb) 57 kg (125 lb 10.6 oz)    Exam:   General:  NAD  Cardiovascular: RRR  Respiratory: CTAB  Abdomen: Soft, nontender, nondistended, positive bowel sounds.  Musculoskeletal: No clubbing cyanosis or edema.  Data Reviewed: Basic Metabolic Panel:  Recent Labs Lab 07/07/14 1125 07/07/14 1155 07/07/14 1209  NA 133* 132* 133*  K 4.2 4.3 4.4  CL 97 96 95*  CO2  --  28  --   GLUCOSE 95 112* 112*  BUN 18 15 16   CREATININE 0.70 0.77 0.70  CALCIUM  --  8.8  --    Liver Function Tests:  Recent Labs Lab 07/07/14 1155  AST 23  ALT 14  ALKPHOS 58  BILITOT 0.5  PROT 6.2  ALBUMIN 3.2*   No results for input(s): LIPASE, AMYLASE in the last 168 hours. No results for input(s): AMMONIA in the last 168 hours. CBC:  Recent Labs Lab 07/07/14 1115 07/07/14 1125 07/07/14 1155 07/07/14 1209  WBC 4.8  --  4.9  --   NEUTROABS  --   --  3.4  --   HGB 12.8 14.3 12.2 12.9  HCT 37.9 42.0 35.9* 38.0  MCV 96.2  --  96.0  --   PLT 266  --  261  --    Cardiac Enzymes:  Recent Labs Lab 07/07/14 1155  CKTOTAL 41  CKMB 1.3   BNP (last 3 results) No results for input(s): BNP in the last 8760 hours.  ProBNP (last 3 results) No results for input(s): PROBNP in the last 8760 hours.  CBG: No results for input(s): GLUCAP in the last 168 hours.  No results found for this or any previous visit (from the past 240 hour(s)).  Studies: Dg Chest 2 View  07/07/2014   CLINICAL DATA:  Near syncopal episode. History of atrial fibrillation and hypertension.  EXAM: CHEST  2 VIEW  COMPARISON:  07/02/2014  FINDINGS: Grossly unchanged borderline enlarged cardiac silhouette. Normal mediastinal contours. Atherosclerotic plaque within the thoracic aorta. There is grossly unchanged apical predominant slightly nodular interstitial thickening, left greater right. Lungs remain hyperexpanded with flattening the bilateral main diaphragms. There is unchanged pleural pleural parenchymal  thickening about the bilateral major and the right minor fissures. No pleural effusion or pneumothorax. No evidence of edema. No acute osseus abnormalities.  IMPRESSION: 1. Similar findings of lung hyperexpansion and apical predominant bronchitic change without definite superimposed acute cardiopulmonary disease. 2. Borderline cardiomegaly without evidence of edema.   Electronically Signed   By: Sandi Mariscal M.D.   On: 07/07/2014 13:51   Mr Brain Wo Contrast  07/07/2014   CLINICAL DATA:  81 year old hypertensive female with hyperlipidemia and atrial fibrillation presenting with generalized weakness. Initial encounter.  EXAM: MRI HEAD WITHOUT CONTRAST  TECHNIQUE: Multiplanar, multiecho pulse sequences of the brain and surrounding structures were obtained without intravenous contrast.  COMPARISON:  None.  FINDINGS: No acute infarct.  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  Mild small vessel disease type changes.  No intracranial hemorrhage.  Global atrophy. Ventricular prominence felt to be related to atrophy and remote infarcts without underlying hydrocephalus.  No intracranial mass lesion noted on this unenhanced exam.  Major intracranial vascular structures are patent.  Mild transverse ligament hypertrophy. Mild cervical spondylotic changes C3-4. Cervical medullary junction, pituitary region, pineal region and orbital structures unremarkable.  IMPRESSION: No acute infarct.  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  Mild small vessel disease type changes.  No intracranial hemorrhage.  Global atrophy.   Electronically Signed   By: Genia Del M.D.   On: 07/07/2014 12:49    Scheduled Meds: . atenolol  25 mg Oral BID  . ferrous sulfate  325 mg Oral Q breakfast  . FLUoxetine  20 mg Oral Daily  . levofloxacin  750 mg  Oral Q supper  . loratadine  10 mg Oral Daily  . rivaroxaban  20 mg Oral Q supper  . simvastatin  40 mg Oral Daily  . sodium chloride  3 mL Intravenous Q12H   Continuous Infusions: . sodium chloride 100 mL/hr (07/08/14 1115)    Principal Problem:   Pre-syncope Active Problems:   Near syncope   Atrial fibrillation   Hyperlipidemia    Time spent: 40 mins    [facility] MD [facility] Pager [phone]. If 7PM-7AM, please contact night-coverage at [email or web address], password [facility] 07/08/2014, 12:37 PM

## 2014-07-09 DIAGNOSIS — R55 Syncope and collapse: Secondary | ICD-10-CM | POA: Diagnosis not present

## 2014-07-09 DIAGNOSIS — I482 Chronic atrial fibrillation: Secondary | ICD-10-CM | POA: Diagnosis not present

## 2014-07-09 DIAGNOSIS — E785 Hyperlipidemia, unspecified: Secondary | ICD-10-CM | POA: Diagnosis not present

## 2014-07-09 LAB — BASIC METABOLIC PANEL
Anion gap: 5 (ref 5–15)
BUN: 9 mg/dL (ref 6–23)
CALCIUM: 8.3 mg/dL — AB (ref 8.4–10.5)
CO2: 25 mmol/L (ref 19–32)
CREATININE: 0.71 mg/dL (ref 0.50–1.10)
Chloride: 106 mmol/L (ref 96–112)
GFR calc Af Amer: >90 mL/min (ref >90)
GFR calc non Af Amer: 80 mL/min — ABNORMAL LOW (ref >90)
Glucose, Bld: 108 mg/dL — ABNORMAL HIGH (ref 70–99)
Potassium: 3.6 mmol/L (ref 3.5–5.1)
Sodium: 136 mmol/L (ref 135–145)

## 2014-07-09 MED ORDER — ATENOLOL 25 MG PO TABS: 25.0000 mg | ORAL_TABLET | Freq: Two times a day (BID) | ORAL | Status: DC

## 2014-07-09 NOTE — Progress Notes (Signed)
DC instructions given to patient and daughter. All questions answered, Rx given to patient. VSS. CCMD notified of DC, PIV DC-hemostasis achieved.  All belongings sent home with patient including dentures, cell phone, clothing, book. Pt escorted to private vehicle by wheelchair and volunteer services. Daughter at bedside.

## 2014-07-09 NOTE — Discharge Summary (Signed)
Physician Discharge Summary  Megan Munoz WIO:[id number] DOB: 02-17-1933 DOA: 07/07/2014  PCP: [name], MD  Admit date: 07/07/2014 Discharge date: 07/09/2014  Time spent: 65 minutes  Recommendations for Outpatient Follow-up:  1. Follow-up with [facility], MD in 1 week. On follow-up patient needs a basic metabolic profile to follow-up on electrolytes and renal function. Patient blood pressure need to be reassessed as her lisinopril was discontinued on discharge. Patient's heart rate will need to be reassessed as atenolol dose was decreased back to 25 mg twice daily. 2.   Discharge Diagnoses:  Principal Problem:   Pre-syncope Active Problems:   Near syncope   Atrial fibrillation   Hyperlipidemia   Discharge Condition: Stable and improved  Diet recommendation: Heart healthy  Filed Weights   07/07/14 1101 07/07/14 1800 07/09/14 0426  Weight: 57.153 kg (126 lb) 57 kg (125 lb 10.6 oz) 59.149 kg (130 lb 6.4 oz)    History of present illness:  Per Dr Megan Munoz is a 81 y.o. female has a past medical history significant for A. fib, hypertension, hyperlipidemia, presented to the emergency room with a chief complaint of breath syncopal episode. Patient had been having a recent upper respiratory infection for which she was evaluated in her primary care doctor's office few days ago. At that time, she was found to have an elevated heart rate of 130, and her atenolol was increased from 25 mg twice daily to 50 mg twice daily. That was about 6 days prior to admission. It appearrred that she was also placed on levofloxacin. On the morning of admission, her family felt like she was not herself, as she did not follow her regular routine. In the morning she got some coffee, immediately felt weak and appeared to be with decreased responsiveness. She did not fall and was directed to the chair, and appearred to be very fatigued and very slow to respond. Per family there was no loss of  consciousness or any other focal deficits, no slurred speech. Patient was feeling back to baseline in the emergency room, she is quite emotional about the fact that she is having to stay. She denied any chest pain, denied any respiratory problems, she denied any abdominal pain, nausea, vomiting or diarrhea. She denied any fever or chills. In the emergency room, patient's vital signs are stable, she was normotensive EKG showed rate-controlled atrial fibrillation. Blood work was fairly unremarkable normal renal function and normal CBC. TRH asked for admission for presyncopal episode.    Hospital Course:  #1 presyncopal episode Patient was admitted with presyncopal episode. Likely multifactorial secondary to volume depletion secondary to decreased oral intake, recent upper respiratory infection/questionable pneumonia and increased beta blocker dose. Patient's blood pressure had been borderline on presentation to the emergency room. Patient was placed on IV fluids. Patient's atenolol was held initially and resumed the next day. MRI head negative. No arrhythmias were noted on telemetry. Patient's atenolol had been decreased back to her prior dose of 25 mg twice a day. Patient improved clinically but her blood pressure improved and did not have any further presyncopal episodes. IV fluids were discontinued. Patient's lisinopril and Imdur were discontinued. Patient will follow-up with PCP as outpatient.   #2 atrial fibrillation Patient was placed back on atenolol at her prior dose of 25 mg daily for rate control. Patient Megan Munoz was continued. Patient's rate remained controlled and she'll follow-up with her cardiologist as outpatient.  #3 recent upper respiratory infection/?? Pneumonia Per family patient had upper respiratory symptoms and was placed  empirically on Levaquin to treat a presumed pneumonia. Patient with clinical improvement. Patient was maintained on Levaquin and be discharged to finish her course.    #4 hyperlipidemia Continued on home regimen of statin.   Procedures:  MRI 07/07/14  CXR 07/02/14, 07/07/14  Consultations:  None  Discharge Exam: Filed Vitals:   07/09/14 0426  BP: 120/66  Pulse: 89  Temp: 98.2 F (36.8 C)  Resp: 16    General: NAD Cardiovascular: RRR Respiratory: CTAB  Discharge Instructions   Discharge Instructions    Diet - low sodium heart healthy    Complete by:  As directed      Discharge instructions    Complete by:  As directed   Follow up with [facility], MD in 1 week. Outpatient PT     Increase activity slowly    Complete by:  As directed           Current Discharge Medication List    CONTINUE these medications which have CHANGED   Details  atenolol (TENORMIN) 25 MG tablet Take 1 tablet (25 mg total) by mouth 2 (two) times daily. Qty: 60 tablet, Refills: 0      CONTINUE these medications which have NOT CHANGED   Details  albuterol (ACCUNEB) 0.63 MG/3ML nebulizer solution Take 3 mLs by nebulization every 6 (six) hours as needed for wheezing or shortness of breath.  Refills: 0    alendronate (FOSAMAX) 70 MG tablet Take 70 mg by mouth once a week. Take with a full glass of water on an empty stomach on Tuesday    B Complex Vitamins (VITAMIN B COMPLEX PO) Take 1 tablet by mouth daily.     Calcium Carb-Cholecalciferol (CALCIUM + D3) 600-200 MG-UNIT TABS Take 1-2 tablets by mouth 2 (two) times daily. 2 tablets in the morning and 1 tablet in the evening.    diphenhydrAMINE (SOMINEX) 25 MG tablet Take 50 mg by mouth at bedtime.     ferrous sulfate 325 (65 FE) MG tablet Take 325 mg by mouth daily with breakfast.    Flaxseed, Linseed, (EQL FLAX SEED OIL) 1000 MG CAPS Take 1,000 mg by mouth daily.     FLUoxetine (PROZAC) 20 MG capsule Take 20 mg by mouth daily.    levofloxacin (LEVAQUIN) 750 MG tablet Take 750 mg by mouth daily with supper.  Refills: 0    loratadine (CLARITIN) 10 MG tablet Take 10 mg by mouth daily.     Melatonin 10 MG TABS Take 10 mg by mouth at bedtime.     Multiple Vitamin (MULTIVITAMIN) capsule Take 1 capsule by mouth daily.    primidone (MYSOLINE) 250 MG tablet Take 250 mg by mouth 2 (two) times daily.     rivaroxaban (XARELTO) 20 MG TABS tablet Take 20 mg by mouth daily with supper.    simvastatin (ZOCOR) 40 MG tablet Take 40 mg by mouth daily.      STOP taking these medications     lisinopril (PRINIVIL,ZESTRIL) 20 MG tablet        Allergies  Allergen Reactions  . Tetanus Toxoids    Follow-up Information    Follow up with [facility].   Specialty:  Rehabilitation   Why:  Office will call with appointment times. If office has not called within 2 business days please call and make appointment.    Contact information:   [address] [place] [id number] [place] [id number] [phone]      Follow up with [facility], [place]  on 07/13/2014.   Specialty:  Family Medicine   Why:  hospital f/u @ 2:45pm    Contact information:   [address] [place] [address] [phone]        The results of significant diagnostics from this hospitalization (including imaging, microbiology, ancillary and laboratory) are listed below for reference.    Significant Diagnostic Studies: Dg Chest 2 View  07/07/2014   CLINICAL DATA:  Near syncopal episode. History of atrial fibrillation and hypertension.  EXAM: CHEST  2 VIEW  COMPARISON:  07/02/2014  FINDINGS: Grossly unchanged borderline enlarged cardiac silhouette. Normal mediastinal contours. Atherosclerotic plaque within the thoracic aorta. There is grossly unchanged apical predominant slightly nodular interstitial thickening, left greater right. Lungs remain hyperexpanded with flattening the bilateral main diaphragms. There is unchanged pleural pleural parenchymal thickening about the bilateral major and the right minor fissures. No pleural effusion or  pneumothorax. No evidence of edema. No acute osseus abnormalities.  IMPRESSION: 1. Similar findings of lung hyperexpansion and apical predominant bronchitic change without definite superimposed acute cardiopulmonary disease. 2. Borderline cardiomegaly without evidence of edema.   Electronically Signed   By: Sandi Mariscal M.D.   On: 07/07/2014 13:51   Dg Chest 2 View  07/02/2014   CLINICAL DATA:  Cough.  Shortness of breath .  EXAM: CHEST  2 VIEW  COMPARISON:  None.  FINDINGS: Mediastinum and hilar structures are normal. Lungs clear. No pleural effusion or pneumothorax. Cardiomegaly with normal pulmonary vascularity. No acute bony abnormality.  IMPRESSION: 1. Cardiomegaly.  No pulmonary venous congestion. 2. Apical pleural parenchymal thickening noted, these changes are most consistent with scarring.   Electronically Signed   By: Marcello Moores  Register   On: 07/02/2014 13:31   Mr Brain Wo Contrast  07/07/2014   CLINICAL DATA:  81 year old hypertensive female with hyperlipidemia and atrial fibrillation presenting with generalized weakness. Initial encounter.  EXAM: MRI HEAD WITHOUT CONTRAST  TECHNIQUE: Multiplanar, multiecho pulse sequences of the brain and surrounding structures were obtained without intravenous contrast.  COMPARISON:  None.  FINDINGS: No acute infarct.  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  Mild small vessel disease type changes.  No intracranial hemorrhage.  Global atrophy. Ventricular prominence felt to be related to atrophy and remote infarcts without underlying hydrocephalus.  No intracranial mass lesion noted on this unenhanced exam.  Major intracranial vascular structures are patent.  Mild transverse ligament hypertrophy. Mild cervical spondylotic changes C3-4. Cervical medullary junction, pituitary region, pineal region and orbital structures unremarkable.  IMPRESSION: No acute infarct.  Moderate  to large remote left frontal lobe infarct with encephalomalacia and dilation of the frontal horn of the left lateral ventricle.  Mild small vessel disease type changes.  No intracranial hemorrhage.  Global atrophy.   Electronically Signed   By: Genia Del M.D.   On: 07/07/2014 12:49    Microbiology: No results found for this or any previous visit (from the past 240 hour(s)).   Labs: Basic Metabolic Panel:  Recent Labs Lab 07/07/14 1125 07/07/14 1155 07/07/14 1209 07/08/14 1250 07/09/14 0535  NA 133* 132* 133* 133* 136  K 4.2 4.3 4.4 3.8 3.6  CL 97 96 95* 102 106  CO2  --  28  --  23 25  GLUCOSE 95 112* 112* 120* 108*  BUN 18 15 16 10 9   CREATININE 0.70 0.77 0.70 0.60 0.71  CALCIUM  --  8.8  --  8.4 8.3*   Liver Function Tests:  Recent Labs Lab 07/07/14 1155  AST 23  ALT 14  ALKPHOS 58  BILITOT 0.5  PROT 6.2  ALBUMIN 3.2*   No results for input(s): LIPASE, AMYLASE in the last 168 hours. No results for input(s): AMMONIA in the last 168 hours. CBC:  Recent Labs Lab 07/07/14 1115 07/07/14 1125 07/07/14 1155 07/07/14 1209 07/08/14 1250  WBC 4.8  --  4.9  --  5.4  NEUTROABS  --   --  3.4  --   --   HGB 12.8 14.3 12.2 12.9 12.1  HCT 37.9 42.0 35.9* 38.0 35.8*  MCV 96.2  --  96.0  --  96.5  PLT 266  --  261  --  262   Cardiac Enzymes:  Recent Labs Lab 07/07/14 1155  CKTOTAL 41  CKMB 1.3   BNP: BNP (last 3 results) No results for input(s): BNP in the last 8760 hours.  ProBNP (last 3 results) No results for input(s): PROBNP in the last 8760 hours.  CBG: No results for input(s): GLUCAP in the last 168 hours.     SignedIrine Seal MD [facility] 07/09/2014, 10:33 AM

## 2014-07-09 NOTE — Evaluation (Signed)
Occupational Therapy Evaluation Patient Details Name: Megan Munoz MRN: [id number] DOB: 09-30-1932 Today's Date: 07/09/2014    History of Present Illness Megan Munoz is a 81 y.o. female has a past medical history significant for A. fib, hypertension, hyperlipidemia, presents to the emergency room with a chief complaint of breath syncopal episode   Clinical Impression   Patient admitted with above. Patient independent PTA. Patient currently functioning at an overall supervision level. D/C from acute OT services and no additional follow-up OT needs at this time. All appropriate education provided to patient. Please re-order OT if needed. Discussed importance of 24/7 supervision post discharge for the next few days, patient agreed.     Follow Up Recommendations  No OT follow up;Supervision/Assistance - 24 hour    Equipment Recommendations  3 in 1 bedside comode    Recommendations for Other Services  None at this time  Precautions / Restrictions Precautions Precautions: Fall Restrictions Weight Bearing Restrictions: No      Mobility Bed Mobility Overal bed mobility: Modified Independent General bed mobility comments: bed rails used minimally   Transfers Overall transfer level: Modified independent Equipment used: None   General transfer comment: increased time for safety    Balance Overall balance assessment: No apparent balance deficits (not formally assessed)    ADL Overall ADL's : Needs assistance/impaired General ADL Comments: Patient overall supervision for functional tasks, mobility, and transfers. Discussed importance of having someone provide supervision 24/7 post discharge for a few days. Discussed use of 3-in-1 over toilet seat secondary to patient with some difficulty transferring <> standard size toilet seat. Patient stated she has a shower seat in her tub/shower that she routinely uses at home, encouraged her to keep using this. Patient stood at sink for grooming task  of brushing teeth at supervision>mod I level. HR and 02 sats remained in good range during entire session.     Pertinent Vitals/Pain Pain Assessment: No/denies pain     Hand Dominance Right   Extremity/Trunk Assessment Upper Extremity Assessment Upper Extremity Assessment: Overall WFL for tasks assessed   Lower Extremity Assessment Lower Extremity Assessment: Overall WFL for tasks assessed   Cervical / Trunk Assessment Cervical / Trunk Assessment: Normal   Communication Communication Communication: No difficulties   Cognition Arousal/Alertness: Awake/alert Behavior During Therapy: WFL for tasks assessed/performed Overall Cognitive Status: Within Functional Limits for tasks assessed              Home Living Family/patient expects to be discharged to:: Private residence Living Arrangements: Children (daughter, son-in-law, and 5 grandchildren) Available Help at Discharge: Family;Available PRN/intermittently Type of Home: House Home Access: Stairs to enter [employer] of Steps: 1-2   Home Layout: Two level;Bed/bath upstairs Alternate Level Stairs-Number of Steps: flight - 17 steps Alternate Level Stairs-Rails: Left Bathroom Shower/Tub: Tub/shower unit;Curtain   Biochemist, clinical: Standard     Home Equipment: None     Prior Functioning/Environment Comments: 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 82-16 yo    OT Diagnosis:  n/a, no acute OT needs at this time   OT Problem List:   n/a, no acute OT needs at this time   OT Treatment/Interventions:   n/a, no acute OT needs at this time   OT Goals(Current goals can be found in the care plan section) Acute Rehab OT Goals Patient Stated Goal: go home today  OT Frequency:   n/a, no acute OT needs at this time   Barriers to D/C:  None at this time    End of  Session Activity Tolerance: Patient tolerated treatment well Patient left: in bed;with call bell/phone within reach   Time: 0820-0843 OT Time Calculation (min):  23 min Charges:  OT General Charges $OT Visit: 1 Procedure OT Evaluation $Initial OT Evaluation Tier I: 1 Procedure OT Treatments $Self Care/Home Management : 8-22 mins G-Codes: OT G-codes **NOT FOR INPATIENT CLASS** Functional Limitation: Self care Self Care Current Status (I0973): At least 1 percent but less than 20 percent impaired, limited or restricted Self Care Goal Status (Z3299): At least 1 percent but less than 20 percent impaired, limited or restricted Self Care Discharge Status [phone]): At least 1 percent but less than 20 percent impaired, limited or restricted  Kathe Wirick , MS, OTR/L, CLT Pager: [phone]  07/09/2014, 8:57 AM

## 2014-07-09 NOTE — Care Management Note (Signed)
    Page 1 of 1   07/09/2014     10:22:32 AM CARE MANAGEMENT NOTE 07/09/2014  Patient:  Megan Munoz, Megan Munoz   Account Number:  [id number]  Date Initiated:  07/09/2014  Documentation initiated by:  GRAVES-BIGELOW,Ashwin Tibbs  Subjective/Objective Assessment:   Pt admitted for syncope.     Action/Plan:   CM did set up with outpatient services. Appointment placed in EPIC. DME 3n1 delivered to room. No further needs from CM at this time.   Anticipated DC Date:  07/09/2014   Anticipated DC Plan:  [place]  CM consult      PAC Choice  DURABLE MEDICAL EQUIPMENT   Choice offered to / List presented to:  C-1 Patient   DME arranged  3-N-1      DME agency  [facility].        Status of service:  Completed, signed off Medicare Important Message given?  NO (If response is "NO", the following Medicare IM given date fields will be blank) Date Medicare IM given:   Medicare IM given by:   Date Additional Medicare IM given:   Additional Medicare IM given by:    Discharge Disposition:  HOME/SELF CARE  Per UR Regulation:  Reviewed for med. necessity/level of care/duration of stay  If discussed at [place] of Stay Meetings, dates discussed:    Comments:

## 2014-07-13 ENCOUNTER — Ambulatory Visit: Payer: Self-pay | Admitting: Cardiology

## 2014-07-13 DIAGNOSIS — I639 Cerebral infarction, unspecified: Secondary | ICD-10-CM | POA: Diagnosis not present

## 2014-07-13 DIAGNOSIS — I10 Essential (primary) hypertension: Secondary | ICD-10-CM | POA: Diagnosis not present

## 2014-07-13 DIAGNOSIS — I428 Other cardiomyopathies: Secondary | ICD-10-CM | POA: Diagnosis not present

## 2014-07-13 DIAGNOSIS — R55 Syncope and collapse: Secondary | ICD-10-CM | POA: Diagnosis not present

## 2014-07-16 ENCOUNTER — Ambulatory Visit (INDEPENDENT_AMBULATORY_CARE_PROVIDER_SITE_OTHER): Payer: Medicare Other | Admitting: Cardiology

## 2014-07-16 ENCOUNTER — Encounter: Payer: Self-pay | Admitting: Cardiology

## 2014-07-16 VITALS — BP 96/54 | HR 82 | Ht 67.0 in | Wt 132.1 lb

## 2014-07-16 DIAGNOSIS — E785 Hyperlipidemia, unspecified: Secondary | ICD-10-CM

## 2014-07-16 DIAGNOSIS — I482 Chronic atrial fibrillation, unspecified: Secondary | ICD-10-CM

## 2014-07-16 DIAGNOSIS — I639 Cerebral infarction, unspecified: Secondary | ICD-10-CM | POA: Diagnosis not present

## 2014-07-16 DIAGNOSIS — R55 Syncope and collapse: Secondary | ICD-10-CM

## 2014-07-16 DIAGNOSIS — I10 Essential (primary) hypertension: Secondary | ICD-10-CM | POA: Diagnosis not present

## 2014-07-16 MED ORDER — METOPROLOL TARTRATE 25 MG PO TABS: ORAL_TABLET | ORAL | Status: DC

## 2014-07-16 MED ORDER — ATENOLOL 25 MG PO TABS: 25.0000 mg | ORAL_TABLET | Freq: Every day | ORAL | Status: DC

## 2014-07-16 NOTE — Progress Notes (Signed)
PATIENT: Megan Munoz MRN: [id number] DOB: 1932/05/22 PCP: [name], MD  Clinic Note: Chief Complaint  Patient presents with  . New Evaluation    hx of a fib and strokes ,--new to area,-- no chest discomfort, a little ankle swelling  . Atrial Fibrillation    HPI: Megan Munoz is a 81 y.o. female with a PMH below who presents today for Hospital Follow-up for Near Syncope - but was actually scheduled to establish Cardiology Care for Afib. Had been seeing a Cardiologist in [place], [place]. - Afib Dx'd ~1 yr ago -- on Atenolol & Xarelto. Apparently had an echocardiogram and stress test. 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 She recently moved to [place], a with her daughter and family. It appears she may have some mild baseline dementia.  She was in the hospital but briefly for an episode of near syncope. 6 days prior to her visit to the hospital visit, she had seen her PCP with a rate of 130 beats/ minute and her beta blocker dose was had been increased to 50 mg twice a day. On the morning of the admission, her family doctor to be not acting like herself. She felt weak and had decreased responsiveness after getting some coffee. She was caught by her granddaughter. Was no loss of consciousness or slurred speech. At no time did she note any symptoms of chest tightness or pressure or even rapid irregular heartbeats. Her episode was thought to be related to volume region from 4 by mouth intake secondary to recent upper respiratory tract infection, treated by her increase beta blocker dose. She was treated with IV fluids and her beta blocker dose was held. On discharge she was discharged on 25 mg twice a day atenolol ( her previous standing dose).  Her blood pressure improved, her lisinopril and Imdur were discontinued. She was continued on Xarelto for anticoagulation and had no issues otherwise with A. Fib. She was treated for the continued course of Levaquin for possible pneumonia.  Interval History:  Since d/c from hospital, no further episodes of near syncope.  She rarely has long standing diagnosis of active fibrillation now with relatively no symptoms whatsoever being in A. Fib or not. The etiology of her stroke was never fully understood however probably now the same reason that atrial fibrillation is a consideration.   For the most part, she is otherwise asymptomatic from cardiac standpoint. That one episode of near syncopeis the only potential symptom recently.  Cardiovascular ROS: no chest pain or dyspnea on exertion positive for - episode of near syncope negative for - edema, loss of consciousness, murmur, orthopnea, palpitations, paroxysmal nocturnal dyspnea, rapid heart rate, shortness of breath or TIA/Amaurosis fugax :  Past Medical History  Diagnosis Date  . Anxiety   . Cerebral infarction 10/2008    MRI Brain 05/2014 for near syncope revealed: No acure infarct.  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   Mild small vessel disease type changes.  No intracranial hemorrhage.  Global atrophy.   . A-fib 03/2013    On Atenolol for Rate Control & Xarelto for AC.  Marland Kitchen Hypertension   . Tinea unguium   . Hyperlipemia   . Tremor     Prior Cardiac Evaluation and Past Surgical History: Past Surgical History  Procedure Laterality Date  . Carpal tunnel release Left   . Total abdominal hysterectomy    . Foot surgery Right   .  Bladder surgery    . Hernia repair    . Nasal sinus surgery    Not sure what all was done for Cardiology Evaluation in [place].   Allergies  Allergen Reactions  . Tetanus Toxoids     Current Outpatient Prescriptions  Medication Sig Dispense Refill  . albuterol (ACCUNEB) 0.63 MG/3ML nebulizer solution Take 3 mLs by nebulization every 6 (six) hours as needed for wheezing or shortness of breath.   0  . atenolol (TENORMIN) 25 MG tablet Take 1 tablet (25 mg total) by  mouth daily. 60 tablet 0  . B Complex Vitamins (VITAMIN B COMPLEX PO) Take 1 tablet by mouth daily.     . Calcium Carb-Cholecalciferol (CALCIUM + D3) 600-200 MG-UNIT TABS Take 1-2 tablets by mouth 2 (two) times daily. 2 tablets in the morning and 1 tablet in the evening.    . diphenhydrAMINE (SOMINEX) 25 MG tablet Take 50 mg by mouth at bedtime.     . ferrous sulfate 325 (65 FE) MG tablet Take 325 mg by mouth daily with breakfast.    . Flaxseed, Linseed, (EQL FLAX SEED OIL) 1000 MG CAPS Take 1,000 mg by mouth daily.     Marland Kitchen FLUoxetine (PROZAC) 20 MG capsule Take 20 mg by mouth daily.    Marland Kitchen levofloxacin (LEVAQUIN) 750 MG tablet Take 750 mg by mouth daily with supper.   0  . loratadine (CLARITIN) 10 MG tablet Take 10 mg by mouth daily.    . Melatonin 10 MG TABS Take 10 mg by mouth at bedtime.     . Multiple Vitamin (MULTIVITAMIN) capsule Take 1 capsule by mouth daily.    . primidone (MYSOLINE) 250 MG tablet Take 250 mg by mouth 2 (two) times daily.     . rivaroxaban (XARELTO) 20 MG TABS tablet Take 20 mg by mouth daily with supper.    . simvastatin (ZOCOR) 40 MG tablet Take 40 mg by mouth daily.    . metoprolol tartrate (LOPRESSOR) 25 MG tablet TAKE 1/2 TO 1 TABLET FOR RAPID HEART RATE 30 tablet 6   No current facility-administered medications for this visit.    History   Social History Narrative   Widow.  Lives with her daughter & her family.  (Son-in-law & 5 Grandsons).   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   2-4 cups caffeine/day   1-2 glassess of wine/beer  Per week.  Never smoked.    family history includes Asthma in her daughter; Cancer in her maternal grandmother; Emphysema in her father; Osteoporosis in her mother; Stroke in her brother.  ROS: A comprehensive Review of Systems -  Review of Systems  HENT: Negative for congestion.   Respiratory: Negative for cough, shortness of breath and wheezing.   Cardiovascular: Negative.        Per HPI    Gastrointestinal: Negative for blood in stool and melena.  Genitourinary: Negative for hematuria.  Musculoskeletal: Negative for back pain and joint pain.  Neurological: Negative for focal weakness and weakness.       None since d/c   Psychiatric/Behavioral: Positive for memory loss (along with poor executive function).  All other systems reviewed and are negative.   PHYSICAL EXAM BP 96/54 mmHg  Pulse 82  Ht 5\' 7"  (1.702 m)  Wt 132 lb 1.6 oz (59.92 kg)  BMI 20.68 kg/m2 General appearance: alert, cooperative, appears stated age, no distress and Pleasant mood and affect HEENT: [place]/AT, EOMI, MMM, anicteric sclera Neck: no  adenopathy, no carotid bruit, no JVD and supple, symmetrical, trachea midline Lungs: clear to auscultation bilaterally and normal percussion bilaterally Heart: Irregularly irregular, no M./R./G., nondisplaced PMI. Abdomen: soft, non-tender; bowel sounds normal; no masses,  no organomegaly Extremities: extremities normal, atraumatic, no cyanosis or edema Pulses: 2+ and symmetric Skin: Skin color, texture, turgor normal. No rashes or lesions or Somewhat thin and frail skin however from age Neurologic: Mental status: Alert, oriented, thought content appropriate Cranial nerves: normal   Adult ECG Report  Rate: 82 ;  Rhythm: sinus arrhythmia and Non-specific ST-T wave abnormalities.  rightward axis (100);  Normal voltage, durations& intervals. poor anterior R-wave progression to  Narrative Interpretation: Otherwise,no ischemic changes  Recent Labs:    Chemistry      Component Value Date/Time   NA 136 07/09/2014 0535   K 3.6 07/09/2014 0535   CL 106 07/09/2014 0535   CO2 25 07/09/2014 0535   BUN 9 07/09/2014 0535   CREATININE 0.71 07/09/2014 0535      Component Value Date/Time   CALCIUM 8.3* 07/09/2014 0535   ALKPHOS 58 07/07/2014 1155   AST 23 07/07/2014 1155   ALT 14 07/07/2014 1155   BILITOT 0.5 07/07/2014 1155     Lab Results  Component Value Date    WBC 5.4 07/08/2014   HGB 12.1 07/08/2014   HCT 35.8* 07/08/2014   MCV 96.5 07/08/2014   PLT 262 07/08/2014   No results found for: TSH   ASSESSMENT / PLAN:  Reduce Atenolol to 25 mg daily - with PRN Metoprolol 12.5-25 mg  Problem List Items Addressed This Visit    Atrial fibrillation - Primary (Chronic)    It appeared that she could not test persistent if not chronic permanent A. Fib. She doesn't seem to be relatively asymptomatic from it at all. I wonder if the tachycardia that she had been initial PCP visit was related more to dehydration and not a primary rapid A. Fib event. I am concerned with long-lasting beta blocker medications at high doses with elderly patients. My preference would be to use shorter acting medications, but since he has been stable otherwise on atenolol plan will be to continue atenolol but normaldose basis with when necessary metoprolol dosing for rapid rates.  She remains hypotensive despite being off of her other hypertensive agents. She remains on Xarelto without bleeding complications. We'll need to get her prior cardiology evaluation records from her previous cardiologist.      Relevant Medications   atenolol (TENORMIN) 25 MG tablet   metoprolol tartrate (LOPRESSOR) 25 MG tablet   Other Relevant Orders   EKG 12-Lead (Completed)   Essential hypertension (Chronic)    She is hypotensive today despite not being on ACE inhibitor, Imdur.  Her prior records indicate that she was on 20 mg of lisinopril. Apparently she was supposed to cut her ACE inhibitor dose when she increased her beta blocker dose. We'll see how she does with the lower dose of atenolol.  If her blood pressures improved, she may actually go tolerate being back on the ACE inhibitor      Relevant Medications   atenolol (TENORMIN) 25 MG tablet   metoprolol tartrate (LOPRESSOR) 25 MG tablet   Other Relevant Orders   EKG 12-Lead (Completed)   Hyperlipidemia    She is on simvastatin 40 mg  daily. As best I can tell this is being followed by PCP.      Relevant Medications   atenolol (TENORMIN) 25 MG tablet   metoprolol  tartrate (LOPRESSOR) 25 MG tablet   Near syncope    I agree that her syncope was probably volume depletion related and then hypotension. I would continue to hold her ACE inhibitor and Imdur. I'm not exactly sure why Imdur was used. The family is unable explain to her medication regimen.   She remains mildly hypotensive this day despite being off of her lisinopril and Imdur. I am backing off on the beta blocker dose.      Relevant Medications   atenolol (TENORMIN) 25 MG tablet   metoprolol tartrate (LOPRESSOR) 25 MG tablet   Other Relevant Orders   EKG 12-Lead (Completed)      Meds ordered this encounter  Medications  . atenolol (TENORMIN) 25 MG tablet    Sig: Take 1 tablet (25 mg total) by mouth daily.    Dispense:  60 tablet    Refill:  0  . metoprolol tartrate (LOPRESSOR) 25 MG tablet    Sig: TAKE 1/2 TO 1 TABLET FOR RAPID HEART RATE    Dispense:  30 tablet    Refill:  6    Followup: 3-4 months   Careli Luzader W. Ellyn Hack, M.D., M.S. Interventional Cardiolgy [facility]

## 2014-07-16 NOTE — Patient Instructions (Signed)
CHANGE ATENOLOL TO 1 TABLET DAILY  IF YOU HAVE ANY RAPID HEART RATE ,YOU MAY USE 1/2  TO 1 TABLET OF METOPROLOL.  Your physician wants you to follow-up in 3- [place] [facility].  You will receive a reminder letter in the mail two months in advance. If you don't receive a letter, please call our office to schedule the follow-up appointment.

## 2014-07-22 ENCOUNTER — Encounter: Payer: Self-pay | Admitting: Cardiology

## 2014-07-22 DIAGNOSIS — I10 Essential (primary) hypertension: Secondary | ICD-10-CM | POA: Insufficient documentation

## 2014-07-22 NOTE — Assessment & Plan Note (Signed)
She is on simvastatin 40 mg daily. As best I can tell this is being followed by PCP.

## 2014-07-22 NOTE — Assessment & Plan Note (Signed)
I agree that her syncope was probably volume depletion related and then hypotension. I would continue to hold her ACE inhibitor and Imdur. I'm not exactly sure why Imdur was used. The family is unable explain to her medication regimen.   She remains mildly hypotensive this day despite being off of her lisinopril and Imdur. I am backing off on the beta blocker dose.

## 2014-07-22 NOTE — Assessment & Plan Note (Addendum)
She is hypotensive today despite not being on ACE inhibitor, Imdur.  Her prior records indicate that she was on 20 mg of lisinopril. Apparently she was supposed to cut her ACE inhibitor dose when she increased her beta blocker dose. We'll see how she does with the lower dose of atenolol.  If her blood pressures improved, she may actually go tolerate being back on the ACE inhibitor

## 2014-07-22 NOTE — Assessment & Plan Note (Addendum)
It appeared that she could not test persistent if not chronic permanent A. Fib. She doesn't seem to be relatively asymptomatic from it at all. I wonder if the tachycardia that she had been initial PCP visit was related more to dehydration and not a primary rapid A. Fib event. I am concerned with long-lasting beta blocker medications at high doses with elderly patients. My preference would be to use shorter acting medications, but since he has been stable otherwise on atenolol plan will be to continue atenolol but normaldose basis with when necessary metoprolol dosing for rapid rates.  She remains hypotensive despite being off of her other hypertensive agents. She remains on Xarelto without bleeding complications. We'll need to get her prior cardiology evaluation records from her previous cardiologist.

## 2014-08-09 DIAGNOSIS — R296 Repeated falls: Secondary | ICD-10-CM | POA: Diagnosis not present

## 2014-08-09 DIAGNOSIS — I10 Essential (primary) hypertension: Secondary | ICD-10-CM | POA: Diagnosis not present

## 2014-08-09 DIAGNOSIS — R278 Other lack of coordination: Secondary | ICD-10-CM | POA: Diagnosis not present

## 2014-08-09 DIAGNOSIS — M8580 Other specified disorders of bone density and structure, unspecified site: Secondary | ICD-10-CM | POA: Diagnosis not present

## 2014-08-09 DIAGNOSIS — Z8673 Personal history of transient ischemic attack (TIA), and cerebral infarction without residual deficits: Secondary | ICD-10-CM | POA: Diagnosis not present

## 2014-08-09 DIAGNOSIS — Z8701 Personal history of pneumonia (recurrent): Secondary | ICD-10-CM | POA: Diagnosis not present

## 2014-08-09 DIAGNOSIS — I4891 Unspecified atrial fibrillation: Secondary | ICD-10-CM | POA: Diagnosis not present

## 2014-08-09 DIAGNOSIS — Z7901 Long term (current) use of anticoagulants: Secondary | ICD-10-CM | POA: Diagnosis not present

## 2014-08-09 DIAGNOSIS — G250 Essential tremor: Secondary | ICD-10-CM | POA: Diagnosis not present

## 2014-08-09 DIAGNOSIS — R55 Syncope and collapse: Secondary | ICD-10-CM | POA: Diagnosis not present

## 2014-08-09 DIAGNOSIS — E785 Hyperlipidemia, unspecified: Secondary | ICD-10-CM | POA: Diagnosis not present

## 2014-08-09 DIAGNOSIS — F419 Anxiety disorder, unspecified: Secondary | ICD-10-CM | POA: Diagnosis not present

## 2014-08-14 DIAGNOSIS — G250 Essential tremor: Secondary | ICD-10-CM | POA: Diagnosis not present

## 2014-08-14 DIAGNOSIS — I428 Other cardiomyopathies: Secondary | ICD-10-CM | POA: Diagnosis not present

## 2014-08-14 DIAGNOSIS — R55 Syncope and collapse: Secondary | ICD-10-CM | POA: Diagnosis not present

## 2014-08-14 DIAGNOSIS — R278 Other lack of coordination: Secondary | ICD-10-CM | POA: Diagnosis not present

## 2014-08-14 DIAGNOSIS — I10 Essential (primary) hypertension: Secondary | ICD-10-CM | POA: Diagnosis not present

## 2014-08-14 DIAGNOSIS — I4891 Unspecified atrial fibrillation: Secondary | ICD-10-CM | POA: Diagnosis not present

## 2014-08-14 DIAGNOSIS — R296 Repeated falls: Secondary | ICD-10-CM | POA: Diagnosis not present

## 2014-08-14 DIAGNOSIS — F419 Anxiety disorder, unspecified: Secondary | ICD-10-CM | POA: Diagnosis not present

## 2014-08-14 DIAGNOSIS — I639 Cerebral infarction, unspecified: Secondary | ICD-10-CM | POA: Diagnosis not present

## 2014-08-16 DIAGNOSIS — G250 Essential tremor: Secondary | ICD-10-CM | POA: Diagnosis not present

## 2014-08-16 DIAGNOSIS — R55 Syncope and collapse: Secondary | ICD-10-CM | POA: Diagnosis not present

## 2014-08-16 DIAGNOSIS — R296 Repeated falls: Secondary | ICD-10-CM | POA: Diagnosis not present

## 2014-08-16 DIAGNOSIS — F419 Anxiety disorder, unspecified: Secondary | ICD-10-CM | POA: Diagnosis not present

## 2014-08-16 DIAGNOSIS — I4891 Unspecified atrial fibrillation: Secondary | ICD-10-CM | POA: Diagnosis not present

## 2014-08-16 DIAGNOSIS — R278 Other lack of coordination: Secondary | ICD-10-CM | POA: Diagnosis not present

## 2014-08-21 DIAGNOSIS — F419 Anxiety disorder, unspecified: Secondary | ICD-10-CM | POA: Diagnosis not present

## 2014-08-21 DIAGNOSIS — G250 Essential tremor: Secondary | ICD-10-CM | POA: Diagnosis not present

## 2014-08-21 DIAGNOSIS — R296 Repeated falls: Secondary | ICD-10-CM | POA: Diagnosis not present

## 2014-08-21 DIAGNOSIS — I4891 Unspecified atrial fibrillation: Secondary | ICD-10-CM | POA: Diagnosis not present

## 2014-08-21 DIAGNOSIS — R278 Other lack of coordination: Secondary | ICD-10-CM | POA: Diagnosis not present

## 2014-08-21 DIAGNOSIS — R55 Syncope and collapse: Secondary | ICD-10-CM | POA: Diagnosis not present

## 2014-08-23 DIAGNOSIS — R278 Other lack of coordination: Secondary | ICD-10-CM | POA: Diagnosis not present

## 2014-08-23 DIAGNOSIS — I4891 Unspecified atrial fibrillation: Secondary | ICD-10-CM | POA: Diagnosis not present

## 2014-08-23 DIAGNOSIS — G250 Essential tremor: Secondary | ICD-10-CM | POA: Diagnosis not present

## 2014-08-23 DIAGNOSIS — F419 Anxiety disorder, unspecified: Secondary | ICD-10-CM | POA: Diagnosis not present

## 2014-08-23 DIAGNOSIS — R55 Syncope and collapse: Secondary | ICD-10-CM | POA: Diagnosis not present

## 2014-08-23 DIAGNOSIS — R296 Repeated falls: Secondary | ICD-10-CM | POA: Diagnosis not present

## 2014-08-28 DIAGNOSIS — R296 Repeated falls: Secondary | ICD-10-CM | POA: Diagnosis not present

## 2014-08-28 DIAGNOSIS — I4891 Unspecified atrial fibrillation: Secondary | ICD-10-CM | POA: Diagnosis not present

## 2014-08-28 DIAGNOSIS — F419 Anxiety disorder, unspecified: Secondary | ICD-10-CM | POA: Diagnosis not present

## 2014-08-28 DIAGNOSIS — R278 Other lack of coordination: Secondary | ICD-10-CM | POA: Diagnosis not present

## 2014-08-28 DIAGNOSIS — G250 Essential tremor: Secondary | ICD-10-CM | POA: Diagnosis not present

## 2014-08-28 DIAGNOSIS — R55 Syncope and collapse: Secondary | ICD-10-CM | POA: Diagnosis not present

## 2014-08-31 DIAGNOSIS — R278 Other lack of coordination: Secondary | ICD-10-CM | POA: Diagnosis not present

## 2014-08-31 DIAGNOSIS — G250 Essential tremor: Secondary | ICD-10-CM | POA: Diagnosis not present

## 2014-08-31 DIAGNOSIS — I4891 Unspecified atrial fibrillation: Secondary | ICD-10-CM | POA: Diagnosis not present

## 2014-08-31 DIAGNOSIS — R296 Repeated falls: Secondary | ICD-10-CM | POA: Diagnosis not present

## 2014-08-31 DIAGNOSIS — R55 Syncope and collapse: Secondary | ICD-10-CM | POA: Diagnosis not present

## 2014-08-31 DIAGNOSIS — F419 Anxiety disorder, unspecified: Secondary | ICD-10-CM | POA: Diagnosis not present

## 2014-09-04 DIAGNOSIS — R55 Syncope and collapse: Secondary | ICD-10-CM | POA: Diagnosis not present

## 2014-09-04 DIAGNOSIS — R278 Other lack of coordination: Secondary | ICD-10-CM | POA: Diagnosis not present

## 2014-09-04 DIAGNOSIS — I4891 Unspecified atrial fibrillation: Secondary | ICD-10-CM | POA: Diagnosis not present

## 2014-09-04 DIAGNOSIS — F419 Anxiety disorder, unspecified: Secondary | ICD-10-CM | POA: Diagnosis not present

## 2014-09-04 DIAGNOSIS — R296 Repeated falls: Secondary | ICD-10-CM | POA: Diagnosis not present

## 2014-09-04 DIAGNOSIS — G250 Essential tremor: Secondary | ICD-10-CM | POA: Diagnosis not present

## 2014-09-06 DIAGNOSIS — R296 Repeated falls: Secondary | ICD-10-CM | POA: Diagnosis not present

## 2014-09-06 DIAGNOSIS — F419 Anxiety disorder, unspecified: Secondary | ICD-10-CM | POA: Diagnosis not present

## 2014-09-06 DIAGNOSIS — R55 Syncope and collapse: Secondary | ICD-10-CM | POA: Diagnosis not present

## 2014-09-06 DIAGNOSIS — R278 Other lack of coordination: Secondary | ICD-10-CM | POA: Diagnosis not present

## 2014-09-06 DIAGNOSIS — I4891 Unspecified atrial fibrillation: Secondary | ICD-10-CM | POA: Diagnosis not present

## 2014-09-06 DIAGNOSIS — G250 Essential tremor: Secondary | ICD-10-CM | POA: Diagnosis not present

## 2014-10-31 ENCOUNTER — Ambulatory Visit (INDEPENDENT_AMBULATORY_CARE_PROVIDER_SITE_OTHER): Payer: Medicare Other | Admitting: Cardiology

## 2014-10-31 VITALS — BP 116/80 | HR 85 | Ht 67.0 in | Wt 130.5 lb

## 2014-10-31 DIAGNOSIS — I639 Cerebral infarction, unspecified: Secondary | ICD-10-CM

## 2014-10-31 DIAGNOSIS — I10 Essential (primary) hypertension: Secondary | ICD-10-CM | POA: Diagnosis not present

## 2014-10-31 DIAGNOSIS — R55 Syncope and collapse: Secondary | ICD-10-CM

## 2014-10-31 DIAGNOSIS — I482 Chronic atrial fibrillation: Secondary | ICD-10-CM | POA: Diagnosis not present

## 2014-10-31 DIAGNOSIS — I4821 Permanent atrial fibrillation: Secondary | ICD-10-CM

## 2014-10-31 DIAGNOSIS — E785 Hyperlipidemia, unspecified: Secondary | ICD-10-CM | POA: Diagnosis not present

## 2014-10-31 NOTE — Patient Instructions (Signed)
No change in current medications.  Your physician wants you to follow-up in 6 months Dr Ellyn Hack. You will receive a reminder letter in the mail two months in advance. If you don't receive a letter, please call our office to schedule the follow-up appointment.

## 2014-10-31 NOTE — Progress Notes (Signed)
PCP: [name], MD  Clinic Note: Chief Complaint  Patient presents with  . Follow-up      no chest pain, no shortness of breath, edema-has in right ankle, no pain in legs, no cramping in legs, no lightheadedness, no dizziness  . Atrial Fibrillation  . Hypertension    HPI: Megan Munoz is a 81 y.o. female with a PMH below who presents today for 3 months f/u.  [name] was last seen on April 18th as Hospital f/u for Near Syncope & establishing care for Afib.  Was notably hypotensive, but otherwise stable.-- reduced Atenolol dose to 25 mg with additional 12.5-25 mg PRN Metoprolol (as fast action PRN).  Had been seeing a Cardiologist in [place], [place]. - Afib Dx'd ~1 yr ago -- on Atenolol & Xarelto.  Mostly asymptomatic. 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 She has moved to [facility], a with her daughter and family. It appears she may have some mild baseline dementia.  Recent Hospitalizations: None since April 2016 (decribed in previous note). ACE-I & Imdur  held.  Studies Reviewed: no new studies.  Interval History: Megan Munoz seems to be doing relatively well today. She does have some mild right sided greater than left sided edema. Otherwise from a cardiac standpoint he denies any rapid irregular heartbeat/palpitations. No sensation to suggest that she is in atrial fibrillation. She has been doing in-home PT, and was doing very well with that until she well trip and got out of the habit. She is now lost back some milligrams she again. That she was eating better and able to walk much better. No resting or exertional chest pains or pressure, but she does have exertional dyspnea to some extent.  No chest pain or shortness of breath with rest or exertion.  No PND, orthopnea - but does note mild edema. No palpitations, lightheadedness, dizziness, weakness or syncope/near syncope. No TIA/amaurosis fugax symptoms. No  melena, hematochezia, hematuria, or epstaxis. No claudication.  Past Medical History  Diagnosis Date  . Anxiety   . Cerebral infarction 10/2008    MRI Brain 05/2014 for near syncope revealed: No acure infarct.  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   Mild small vessel disease type changes.  No intracranial hemorrhage.  Global atrophy.   . A-fib 03/2013    On Atenolol for Rate Control & Xarelto for AC.  Marland Kitchen Hypertension   . Tinea unguium   . Hyperlipemia   . Tremor     Past Surgical History  Procedure Laterality Date  . Carpal tunnel release Left   . Total abdominal hysterectomy    . Foot surgery Right   . Bladder surgery    . Hernia repair    . Nasal sinus surgery      ROS: A comprehensive was performed. Review of Systems  Constitutional: Negative for malaise/fatigue.  HENT: Negative for ear discharge and nosebleeds.   Respiratory: Positive for cough (rare).   Gastrointestinal: Negative for blood in stool.  Genitourinary: Negative for hematuria.  Musculoskeletal: Positive for falls (No recent falls).  Neurological: Positive for dizziness (positional).  Psychiatric/Behavioral: Positive for memory loss (poor executive fxn.). The patient has insomnia (mostly that she will wake up in the night and have a hard time falling back asleep.).   All other systems reviewed and are negative.   Prior to Admission medications  Medication Sig Start Date End Date Taking? Authorizing Provider  albuterol (ACCUNEB) 0.63 MG/3ML nebulizer solution Take 3 mLs by nebulization every 6 (six) hours as needed for wheezing or shortness of breath.  07/03/14   Historical Provider, MD  atenolol (TENORMIN) 25 MG tablet Take 1 tablet (25 mg total) by mouth daily. 07/16/14   Leonie Man, MD  B Complex Vitamins (VITAMIN B COMPLEX PO) Take 1 tablet by mouth daily.     Historical Provider, MD  Calcium  Carb-Cholecalciferol (CALCIUM + D3) 600-200 MG-UNIT TABS Take 1-2 tablets by mouth 2 (two) times daily. 2 tablets in the morning and 1 tablet in the evening.    Historical Provider, MD  diphenhydrAMINE (SOMINEX) 25 MG tablet Take 50 mg by mouth at bedtime.     Historical Provider, MD  ferrous sulfate 325 (65 FE) MG tablet Take 325 mg by mouth daily with breakfast.    Historical Provider, MD  Flaxseed, Linseed, (EQL FLAX SEED OIL) 1000 MG CAPS Take 1,000 mg by mouth daily.     Historical Provider, MD  FLUoxetine (PROZAC) 20 MG capsule Take 20 mg by mouth daily.    Historical Provider, MD         loratadine (CLARITIN) 10 MG tablet Take 10 mg by mouth daily.    Historical Provider, MD  Melatonin 10 MG TABS Take 10 mg by mouth at bedtime.     Historical Provider, MD  metoprolol tartrate (LOPRESSOR) 25 MG tablet TAKE 1/2 TO 1 TABLET FOR RAPID HEART RATE 07/16/14 Not taking    Leonie Man, MD  Multiple Vitamin (MULTIVITAMIN) capsule Take 1 capsule by mouth daily.    Historical Provider, MD  primidone (MYSOLINE) 250 MG tablet Take 250 mg by mouth 2 (two) times daily.     Historical Provider, MD  rivaroxaban (XARELTO) 20 MG TABS tablet Take 20 mg by mouth daily with supper.    Historical Provider, MD  simvastatin (ZOCOR) 40 MG tablet Take 40 mg by mouth daily.    Historical Provider, MD   Allergies  Allergen Reactions  . Tetanus Toxoids     History   Social History  . Marital Status: Widowed    Spouse Name: N/A  . Number of Children: 1  . Years of Education: 15   Occupational History  . Retired    Social History Main Topics  . Smoking status: Never Smoker   . Smokeless tobacco: Not on file  . Alcohol Use: No  . Drug Use: No  . Sexual Activity: Not on file   Other Topics Concern  . Not on file   Social History Narrative   Widow.  Lives with her daughter & her family.  (Son-in-law & 5 Grandsons).   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   2-4 cups  caffeine/day   1-2 glassess of wine/beer  Per week.  Never smoked.   Family History  Problem Relation Age of Onset  . Osteoporosis Mother   . Emphysema Father   . Stroke Brother   . Cancer Maternal Grandmother   . Asthma Daughter     allergy induced    Wt Readings from Last 3 Encounters:  10/31/14 130 lb 8 oz (59.194 kg)  07/16/14 132 lb 1.6 oz (59.92 kg)  07/09/14 130 lb 6.4 oz (59.149 kg)   PHYSICAL EXAM BP 116/80 mmHg  Pulse 85  Ht 5\' 7"  (1.702 m)  Wt 130 lb 8 oz (59.194 kg)  BMI  20.43 kg/m2 General appearance: alert, cooperative, appears stated age, no distress and Pleasant mood and affect HEENT: [place]/AT, EOMI, MMM, anicteric sclera Neck: no adenopathy, no carotid bruit, no JVD and supple, symmetrical, trachea midline Lungs: clear to auscultation bilaterally and normal percussion bilaterally Heart: Irregularly irregular, no M./R./G., nondisplaced PMI. Abdomen: soft, non-tender; bowel sounds normal; no masses, no organomegaly Extremities: extremities normal, atraumatic, no cyanosis or edema Pulses: 2+ and symmetric Skin: Skin color, texture, turgor normal. No rashes or lesions or Somewhat thin and frail skin however from age Neurologic: Mental status: Alert, oriented, thought content appropriate Cranial nerves: normal   Adult ECG Report  Rate: 85 ;  Rhythm: atrial fibrillation and Controlled ventricular rate. Poor R-wave progression suggest possible septal infarct.;   Narrative Interpretation: Stable EKG with known rate controlled A. fib, borderline septal Q waves and poor R-wave progression.   Other studies Reviewed: Additional studies/ records that were reviewed today include:  Recent Labs:  None available No results found for: CHOL, HDL, LDLCALC, LDLDIRECT, TRIG, CHOLHDL    ASSESSMENT / PLAN: Problem List Items Addressed This Visit    Essential hypertension (Chronic)    Blood pressure is much better today while not on ACE inhibitor and Imdur. She is much less  lethargic with the reduced dose of atenolol.       Relevant Orders   EKG 12-Lead (Completed)   Hyperlipidemia    On simvastatin. Monitored by PCP. With memory issues, would potentially consider switching from simvastatin to pravastatin to avoid potential exacerbations.      Relevant Orders   EKG 12-Lead (Completed)   Near syncope   Relevant Orders   EKG 12-Lead (Completed)   Permanent atrial fibrillation - Primary (Chronic)    She is again in A. fib. Well-controlled rate on low-dose atenolol. Much less lethargic on the lower dose of beta blocker. We talked about the potential of having some breakthrough rapid episodes and using short acting beta blockers metoprolol. This was never refilled. An in the absence of symptoms since last visit. I think were fine not having it. Certainly would opt for rate control of her rhythm control is low she is asymptomatic. No significant bleeding issues with Xarelto Have sent off for her records from her prior cardiologist and will update the history section once the studies arrive.      Relevant Orders   EKG 12-Lead (Completed)   Pre-syncope    No further episodes         Current medicines are reviewed at length with the patient today. (+/- concerns) she never filled the metoprolol dose we can simply take it off her list The following changes have been made: None Studies Ordered:   Orders Placed This Encounter  Procedures  . EKG 12-Lead      Leonie Man, M.D., M.S. Interventional Cardiologist   Pager # [phone]

## 2014-11-01 DIAGNOSIS — R278 Other lack of coordination: Secondary | ICD-10-CM | POA: Diagnosis not present

## 2014-11-01 DIAGNOSIS — I4891 Unspecified atrial fibrillation: Secondary | ICD-10-CM | POA: Diagnosis not present

## 2014-11-01 DIAGNOSIS — F419 Anxiety disorder, unspecified: Secondary | ICD-10-CM | POA: Diagnosis not present

## 2014-11-01 DIAGNOSIS — G250 Essential tremor: Secondary | ICD-10-CM | POA: Diagnosis not present

## 2014-11-02 ENCOUNTER — Encounter: Payer: Self-pay | Admitting: Cardiology

## 2014-11-02 NOTE — Assessment & Plan Note (Signed)
Blood pressure is much better today while not on ACE inhibitor and Imdur. She is much less lethargic with the reduced dose of atenolol.

## 2014-11-02 NOTE — Assessment & Plan Note (Signed)
No further episodes

## 2014-11-02 NOTE — Assessment & Plan Note (Signed)
On simvastatin. Monitored by PCP. With memory issues, would potentially consider switching from simvastatin to pravastatin to avoid potential exacerbations.

## 2014-11-02 NOTE — Assessment & Plan Note (Signed)
She is again in A. fib. Well-controlled rate on low-dose atenolol. Much less lethargic on the lower dose of beta blocker. We talked about the potential of having some breakthrough rapid episodes and using short acting beta blockers metoprolol. This was never refilled. An in the absence of symptoms since last visit. I think were fine not having it. Certainly would opt for rate control of her rhythm control is low she is asymptomatic. No significant bleeding issues with Xarelto Have sent off for her records from her prior cardiologist and will update the history section once the studies arrive.

## 2014-11-26 DIAGNOSIS — E785 Hyperlipidemia, unspecified: Secondary | ICD-10-CM | POA: Diagnosis not present

## 2014-11-26 DIAGNOSIS — I10 Essential (primary) hypertension: Secondary | ICD-10-CM | POA: Diagnosis not present

## 2014-11-26 DIAGNOSIS — Z7901 Long term (current) use of anticoagulants: Secondary | ICD-10-CM | POA: Diagnosis not present

## 2014-11-26 DIAGNOSIS — I4891 Unspecified atrial fibrillation: Secondary | ICD-10-CM | POA: Diagnosis not present

## 2014-12-11 ENCOUNTER — Ambulatory Visit: Payer: Medicare Other | Admitting: Neurology

## 2014-12-11 ENCOUNTER — Ambulatory Visit (INDEPENDENT_AMBULATORY_CARE_PROVIDER_SITE_OTHER): Payer: Medicare Other | Admitting: Neurology

## 2014-12-11 ENCOUNTER — Encounter (INDEPENDENT_AMBULATORY_CARE_PROVIDER_SITE_OTHER): Payer: Self-pay

## 2014-12-11 ENCOUNTER — Encounter: Payer: Self-pay | Admitting: Neurology

## 2014-12-11 VITALS — BP 128/76 | HR 79 | Ht 67.0 in | Wt 129.0 lb

## 2014-12-11 DIAGNOSIS — G250 Essential tremor: Secondary | ICD-10-CM | POA: Diagnosis not present

## 2014-12-11 DIAGNOSIS — I482 Chronic atrial fibrillation, unspecified: Secondary | ICD-10-CM

## 2014-12-11 DIAGNOSIS — R269 Unspecified abnormalities of gait and mobility: Secondary | ICD-10-CM | POA: Diagnosis not present

## 2014-12-11 DIAGNOSIS — I639 Cerebral infarction, unspecified: Secondary | ICD-10-CM

## 2014-12-11 DIAGNOSIS — G4700 Insomnia, unspecified: Secondary | ICD-10-CM

## 2014-12-11 MED ORDER — MEMANTINE HCL 10 MG PO TABS: 10.0000 mg | ORAL_TABLET | Freq: Two times a day (BID) | ORAL | Status: DC

## 2014-12-11 NOTE — Progress Notes (Signed)
Chief Complaint  Patient presents with  . Cerebrovascular Accident    MMSE 25/30 - 7 animals.  She is here with her daughter, [name].  Feels memory has mildly declined.  Her energy level is good.  She is still having problems with bilateral hand tremors despite taking Primidone 250mg  BID.      PATIENT: Megan Munoz DOB: 1932/11/01  HISTORICAL  Megan Munoz is a 81 yo RH , female, is accompanied by her daughter [name], referred by her primary care physician Dr Ernie Hew for continued neurological care for her history of stroke, essential tremor,  She had a history of hypertension, hyperlipidemia, diagnosis of atrial fibrillation since 2015, is taking Xarelto  She suffered left frontal, basal ganglion stroke in 2010, presented with mild confusion, slow processing time, initially was treated with aspirin, since her diagnosis of atrial fibrillation in 2015, she was started on anticoagulation Xarelto  She moved in with her daughter recently, no longer driving, but independent in her daily activity, doing laundry, ambulate without assistance, mild memory trouble today's Mini-Mental Status Examination is 26 out of 30  She had long-standing history of gradual onset bilateral hands tremor, was diagnosed with essential tremor, acute worsening since her stroke in 2010, they have tried variable dose of primidone up to 750 mg daily, currently taking 250 mg twice a day, which works out best for her, she still has posturing tremor of her bilateral hands  UPDATE Dec 11 2014: She is with her daughter [name] at today's clinical visit, continue has mild memory trouble, unsteady gait,  I have reviewed MRI report from outside hospital February 2016 1.  No MR evidence of an acute intracranial abnormality. 2.  Sequela of previous left frontal infarct (with resultant cystic encephalomalacia with surrounding gliosis). 3.  Age-appropriate global cerebral volume loss and background microvascular disease. 4.  No MR  evidence of abnormal intracranial enhancement. 5.  Bilateral maxillary sinus disease with small fluid levels suggesting an acute sinusitis.  Her mother developed dementia at age 43s,  She continue has essential tremor, lateral hands shaking, previously tried primidone up to 250 mg 3 times a day without eliminating her left hand tremor, she is using her left hand for utensil, mild shaking, bothers her family more than herself  REVIEW OF SYSTEMS: Full 14 system review of systems performed and notable only for leg swelling, eye redness, memory loss, frequent awakening  ALLERGIES: Allergies  Allergen Reactions  . Tetanus Toxoids     HOME MEDICATIONS: Current Outpatient Prescriptions  Medication Sig Dispense Refill  . alendronate (FOSAMAX) 70 MG tablet Take 70 mg by mouth once a week. Take with a full glass of water on an empty stomach.    Marland Kitchen atenolol (TENORMIN) 25 MG tablet Take by mouth 2 (two) times daily.     . B Complex Vitamins (VITAMIN B COMPLEX PO) Take by mouth daily.    . Calcium Carb-Cholecalciferol (CALCIUM + D3) 600-200 MG-UNIT TABS Take by mouth daily.    . diphenhydrAMINE (SOMINEX) 25 MG tablet Take 50 mg by mouth at bedtime as needed for sleep.     . ferrous sulfate 325 (65 FE) MG tablet Take 325 mg by mouth daily with breakfast.    . Flaxseed, Linseed, (EQL FLAX SEED OIL) 1000 MG CAPS Take by mouth daily.    Marland Kitchen FLUoxetine (PROZAC) 20 MG capsule Take 20 mg by mouth daily.    Marland Kitchen lisinopril (PRINIVIL,ZESTRIL) 20 MG tablet Take 20 mg by mouth. 0.5 tab daily    .  loratadine (CLARITIN) 10 MG tablet Take 10 mg by mouth daily.    . Melatonin 10 MG TABS Take by mouth daily.    . Multiple Vitamin (MULTIVITAMIN) capsule Take 1 capsule by mouth daily.    . primidone (MYSOLINE) 250 MG tablet Take 250 mg by mouth 2 (two) times daily as needed.     . rivaroxaban (XARELTO) 20 MG TABS tablet Take 20 mg by mouth daily with supper.    . simvastatin (ZOCOR) 40 MG tablet Take 40 mg by mouth  daily.       PAST MEDICAL HISTORY: Past Medical History  Diagnosis Date  . Anxiety   . Cerebral infarction 10/2008    MRI Brain 05/2014 for near syncope revealed: No acure infarct.  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   Mild small vessel disease type changes.  No intracranial hemorrhage.  Global atrophy.   . A-fib 03/2013    On Atenolol for Rate Control & Xarelto for AC.  Marland Kitchen Hypertension   . Tinea unguium   . Hyperlipemia   . Tremor     PAST SURGICAL HISTORY: Past Surgical History  Procedure Laterality Date  . Carpal tunnel release Left   . Total abdominal hysterectomy    . Foot surgery Right   . Bladder surgery    . Hernia repair    . Nasal sinus surgery      FAMILY HISTORY: Family History  Problem Relation Age of Onset  . Osteoporosis Mother   . Emphysema Father   . Stroke Brother   . Cancer Maternal Grandmother   . Asthma Daughter     allergy induced    SOCIAL HISTORY:  Social History   Social History  . Marital Status: Widowed    Spouse Name: N/A  . Number of Children: 1  . Years of Education: 15   Occupational History  . Retired    Social History Main Topics  . Smoking status: Never Smoker   . Smokeless tobacco: Not on file  . Alcohol Use: No  . Drug Use: No  . Sexual Activity: Not on file   Other Topics Concern  . Not on file   Social History Narrative   Widow.  Lives with her daughter & her family.  (Son-in-law & 5 Grandsons).   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   2-4 cups caffeine/day   1-2 glassess of wine/beer  Per week.  Never smoked.     PHYSICAL EXAM   Filed Vitals:   12/11/14 1027  BP: 128/76  Pulse: 79  Height: 5\' 7"  (1.702 m)  Weight: 129 lb (58.514 kg)    Not recorded      Body mass index is 20.2 kg/(m^2).  PHYSICAL EXAMNIATION:  Gen: NAD, conversant, well nourised, obese, well  groomed                     Cardiovascular: Regular rate rhythm, no peripheral edema, warm, nontender. Eyes: Conjunctivae clear without exudates or hemorrhage Neck: Supple, no carotid bruise. Pulmonary: Clear to auscultation bilaterally   NEUROLOGICAL EXAM:  MENTAL STATUS: Speech:    Speech is normal; fluent and spontaneous with normal comprehension.  Cognition:     Mini-Mental Status Examination is 25 out of 30 animal naming 7 Orientation, she is not oriented to date Recent and remote memory,she missed 3 out of 3 recalls Normal  attention Normal language, fund of knowledge, was able to copy design, write sentence,  CRANIAL NERVES: CN II: Visual fields are full to confrontation. Fundoscopic exam is normal with sharp discs and no vascular changes. Pupils were equal round reactive to light CN III, IV, VI: extraocular movement are normal. No ptosis. CN V: Facial sensation is intact to pinprick in all 3 divisions bilaterally. Corneal responses are intact.  CN VII: Face is symmetric with normal eye closure and smile. CN VIII: Hearing is normal to rubbing fingers CN IX, X: Palate elevates symmetrically. Phonation is normal. CN XI: Head turning and shoulder shrug are intact CN XII: Tongue is midline with normal movements and no atrophy.  MOTOR:  Bilateral hands posturing tremor, there was no rigidity, Muscle bulk and tone are normal. Muscle strength is normal.   Shoulder abduction Shoulder external rotation Elbow flexion Elbow extension Wrist flexion Wrist extension Finger abduction Hip flexion Knee flexion Knee extension Ankle dorsi flexion Ankle plantar flexion  R 5 5 5 5 5 5 5 5 5 5 5 5   L 5 5 5 5 5 5 5 5 5 5 5 5     REFLEXES: Reflexes are 2+ and symmetric at the biceps, triceps, knees, and ankles. Plantar responses are flexor.  SENSORY: Mildly length dependent decreased light touch, pinprick to ankle level, decreased vibratory sensation to ankle.  COORDINATION: Rapid alternating  movements and fine finger movements are intact. There is no dysmetria on finger-to-nose and heel-knee-shin. There are no abnormal or extraneous movements.   GAIT/STANCE: She tends to guard her lumbar paraspinal muscles,Gait is steady, good arm swing, and turning. Heel and toe walking are normal. Tandem gait is normal.  Romberg is absent.   DIAGNOSTIC DATA (LABS, IMAGING, TESTING) - I reviewed patient records, labs, notes, testing and imaging myself where available   ASSESSMENT AND PLAN  Deasia Chiu is a 81 y.o. female  with vascular risk factor of hypertension, hyperlipidemia, atrial fibrillation, on Xarelto treatment,   Mild cognitive impairment  Mini-Mental Status Examination is 25 out of 30  Central nervous system degenerative disorder, vascular component  Start Namenda 10 mg twice a day  Essential tremor  Will try low-dose primidone 250 mg half tablets twice a day, to decrease the potential side effect Insomnia  Low-dose of melatonin  Decrease daily benadryl use  Consult patient sleep hygiene Gait difficulty:  Multifactorial, aging, deconditioning periventricular small vessel disease  Encourage her keep moderate exercise,    Marcial Pacas, M.D. Ph.D.  [facility] [address], [place], [place] [address] Ph: [phone] Fax: [phone]

## 2014-12-28 DIAGNOSIS — Z23 Encounter for immunization: Secondary | ICD-10-CM | POA: Diagnosis not present

## 2014-12-28 DIAGNOSIS — I10 Essential (primary) hypertension: Secondary | ICD-10-CM | POA: Diagnosis not present

## 2014-12-28 DIAGNOSIS — Z7901 Long term (current) use of anticoagulants: Secondary | ICD-10-CM | POA: Diagnosis not present

## 2014-12-28 DIAGNOSIS — L0390 Cellulitis, unspecified: Secondary | ICD-10-CM | POA: Diagnosis not present

## 2014-12-28 DIAGNOSIS — R55 Syncope and collapse: Secondary | ICD-10-CM | POA: Diagnosis not present

## 2014-12-31 DIAGNOSIS — L0390 Cellulitis, unspecified: Secondary | ICD-10-CM | POA: Diagnosis not present

## 2015-01-31 DIAGNOSIS — H35373 Puckering of macula, bilateral: Secondary | ICD-10-CM | POA: Diagnosis not present

## 2015-01-31 DIAGNOSIS — H524 Presbyopia: Secondary | ICD-10-CM | POA: Diagnosis not present

## 2015-05-01 ENCOUNTER — Telehealth: Payer: Self-pay

## 2015-05-01 ENCOUNTER — Other Ambulatory Visit: Payer: Self-pay

## 2015-05-01 MED ORDER — MEMANTINE HCL 10 MG PO TABS: 10.0000 mg | ORAL_TABLET | Freq: Two times a day (BID) | ORAL | Status: DC

## 2015-05-01 NOTE — Telephone Encounter (Signed)
Request for 90 day supply of Namenda to be sent to [facility]. Order sent. I spoke to [place] at [facility] and canceled refills of Namenda.

## 2015-05-10 DIAGNOSIS — I4891 Unspecified atrial fibrillation: Secondary | ICD-10-CM | POA: Diagnosis not present

## 2015-05-10 DIAGNOSIS — I10 Essential (primary) hypertension: Secondary | ICD-10-CM | POA: Diagnosis not present

## 2015-05-10 DIAGNOSIS — L309 Dermatitis, unspecified: Secondary | ICD-10-CM | POA: Diagnosis not present

## 2015-06-10 ENCOUNTER — Ambulatory Visit: Payer: Medicare Other | Admitting: Nurse Practitioner

## 2015-06-11 DIAGNOSIS — S61401A Unspecified open wound of right hand, initial encounter: Secondary | ICD-10-CM | POA: Diagnosis not present

## 2015-06-18 ENCOUNTER — Ambulatory Visit: Payer: Medicare Other | Admitting: Nurse Practitioner

## 2015-06-19 ENCOUNTER — Encounter: Payer: Self-pay | Admitting: Nurse Practitioner

## 2015-06-29 DIAGNOSIS — I2510 Atherosclerotic heart disease of native coronary artery without angina pectoris: Secondary | ICD-10-CM

## 2015-06-29 DIAGNOSIS — I700 Atherosclerosis of aorta: Secondary | ICD-10-CM

## 2015-06-29 DIAGNOSIS — I2584 Coronary atherosclerosis due to calcified coronary lesion: Secondary | ICD-10-CM

## 2015-06-29 HISTORY — DX: Coronary atherosclerosis due to calcified coronary lesion: I25.84

## 2015-06-29 HISTORY — DX: Atherosclerotic heart disease of native coronary artery without angina pectoris: I25.10

## 2015-06-29 HISTORY — DX: Atherosclerosis of aorta: I70.0

## 2015-07-08 DIAGNOSIS — R591 Generalized enlarged lymph nodes: Secondary | ICD-10-CM | POA: Diagnosis not present

## 2015-07-08 DIAGNOSIS — J984 Other disorders of lung: Secondary | ICD-10-CM | POA: Diagnosis not present

## 2015-07-08 DIAGNOSIS — F0390 Unspecified dementia without behavioral disturbance: Secondary | ICD-10-CM | POA: Diagnosis not present

## 2015-07-08 DIAGNOSIS — D649 Anemia, unspecified: Secondary | ICD-10-CM | POA: Diagnosis present

## 2015-07-08 DIAGNOSIS — Z79899 Other long term (current) drug therapy: Secondary | ICD-10-CM | POA: Diagnosis not present

## 2015-07-08 DIAGNOSIS — R918 Other nonspecific abnormal finding of lung field: Secondary | ICD-10-CM | POA: Diagnosis not present

## 2015-07-08 DIAGNOSIS — Z8701 Personal history of pneumonia (recurrent): Secondary | ICD-10-CM | POA: Diagnosis not present

## 2015-07-08 DIAGNOSIS — J159 Unspecified bacterial pneumonia: Secondary | ICD-10-CM | POA: Diagnosis present

## 2015-07-08 DIAGNOSIS — I509 Heart failure, unspecified: Secondary | ICD-10-CM | POA: Diagnosis present

## 2015-07-08 DIAGNOSIS — F17211 Nicotine dependence, cigarettes, in remission: Secondary | ICD-10-CM | POA: Diagnosis present

## 2015-07-08 DIAGNOSIS — M7989 Other specified soft tissue disorders: Secondary | ICD-10-CM | POA: Diagnosis not present

## 2015-07-08 DIAGNOSIS — I10 Essential (primary) hypertension: Secondary | ICD-10-CM | POA: Diagnosis not present

## 2015-07-08 DIAGNOSIS — J90 Pleural effusion, not elsewhere classified: Secondary | ICD-10-CM | POA: Diagnosis not present

## 2015-07-08 DIAGNOSIS — J189 Pneumonia, unspecified organism: Secondary | ICD-10-CM | POA: Diagnosis not present

## 2015-07-08 DIAGNOSIS — R000 Tachycardia, unspecified: Secondary | ICD-10-CM | POA: Diagnosis not present

## 2015-07-08 DIAGNOSIS — R0602 Shortness of breath: Secondary | ICD-10-CM | POA: Diagnosis not present

## 2015-07-08 DIAGNOSIS — Z8673 Personal history of transient ischemic attack (TIA), and cerebral infarction without residual deficits: Secondary | ICD-10-CM | POA: Diagnosis not present

## 2015-07-08 DIAGNOSIS — I4891 Unspecified atrial fibrillation: Secondary | ICD-10-CM | POA: Diagnosis not present

## 2015-07-08 DIAGNOSIS — R509 Fever, unspecified: Secondary | ICD-10-CM | POA: Diagnosis not present

## 2015-07-08 DIAGNOSIS — J9811 Atelectasis: Secondary | ICD-10-CM | POA: Diagnosis not present

## 2015-07-08 DIAGNOSIS — E785 Hyperlipidemia, unspecified: Secondary | ICD-10-CM | POA: Diagnosis present

## 2015-07-08 DIAGNOSIS — I110 Hypertensive heart disease with heart failure: Secondary | ICD-10-CM | POA: Diagnosis present

## 2015-07-08 DIAGNOSIS — R938 Abnormal findings on diagnostic imaging of other specified body structures: Secondary | ICD-10-CM | POA: Diagnosis not present

## 2015-07-08 DIAGNOSIS — I482 Chronic atrial fibrillation: Secondary | ICD-10-CM | POA: Diagnosis not present

## 2015-07-08 LAB — PROTIME-INR: INR: 1.5 — AB (ref 0.9–1.1)

## 2015-07-23 ENCOUNTER — Other Ambulatory Visit: Payer: Self-pay | Admitting: Family Medicine

## 2015-07-23 ENCOUNTER — Ambulatory Visit
Admission: RE | Admit: 2015-07-23 | Discharge: 2015-07-23 | Disposition: A | Discharge status: Home / Self Care | Payer: Medicare Other | Source: Ambulatory Visit | Attending: Family Medicine | Admitting: Family Medicine

## 2015-07-23 DIAGNOSIS — IMO0002 Reserved for concepts with insufficient information to code with codable children: Secondary | ICD-10-CM

## 2015-07-23 DIAGNOSIS — J189 Pneumonia, unspecified organism: Secondary | ICD-10-CM

## 2015-07-23 DIAGNOSIS — I428 Other cardiomyopathies: Secondary | ICD-10-CM | POA: Diagnosis not present

## 2015-07-23 DIAGNOSIS — R0600 Dyspnea, unspecified: Secondary | ICD-10-CM | POA: Diagnosis not present

## 2015-07-23 DIAGNOSIS — R0602 Shortness of breath: Secondary | ICD-10-CM | POA: Diagnosis not present

## 2015-07-23 DIAGNOSIS — R000 Tachycardia, unspecified: Secondary | ICD-10-CM | POA: Diagnosis not present

## 2015-07-23 DIAGNOSIS — I509 Heart failure, unspecified: Secondary | ICD-10-CM | POA: Diagnosis not present

## 2015-07-23 DIAGNOSIS — I4891 Unspecified atrial fibrillation: Secondary | ICD-10-CM | POA: Diagnosis not present

## 2015-07-23 DIAGNOSIS — R229 Localized swelling, mass and lump, unspecified: Principal | ICD-10-CM

## 2015-07-23 DIAGNOSIS — J690 Pneumonitis due to inhalation of food and vomit: Secondary | ICD-10-CM

## 2015-07-24 ENCOUNTER — Inpatient Hospital Stay (HOSPITAL_COMMUNITY)
Admission: EM | Admit: 2015-07-24 | Discharge: 2015-07-30 | DRG: 193 | Disposition: A | Discharge status: Home Health | Payer: Medicare Other | Attending: Internal Medicine | Admitting: Internal Medicine

## 2015-07-24 ENCOUNTER — Ambulatory Visit
Admission: RE | Admit: 2015-07-24 | Discharge: 2015-07-24 | Disposition: A | Discharge status: Home / Self Care | Payer: Medicare Other | Source: Ambulatory Visit | Attending: Family Medicine | Admitting: Family Medicine

## 2015-07-24 ENCOUNTER — Encounter (HOSPITAL_COMMUNITY): Payer: Self-pay | Admitting: *Deleted

## 2015-07-24 DIAGNOSIS — Z90710 Acquired absence of both cervix and uterus: Secondary | ICD-10-CM

## 2015-07-24 DIAGNOSIS — R0602 Shortness of breath: Secondary | ICD-10-CM | POA: Diagnosis not present

## 2015-07-24 DIAGNOSIS — E871 Hypo-osmolality and hyponatremia: Secondary | ICD-10-CM | POA: Diagnosis present

## 2015-07-24 DIAGNOSIS — Z87891 Personal history of nicotine dependence: Secondary | ICD-10-CM

## 2015-07-24 DIAGNOSIS — D649 Anemia, unspecified: Secondary | ICD-10-CM | POA: Diagnosis present

## 2015-07-24 DIAGNOSIS — E440 Moderate protein-calorie malnutrition: Secondary | ICD-10-CM | POA: Diagnosis not present

## 2015-07-24 DIAGNOSIS — I509 Heart failure, unspecified: Secondary | ICD-10-CM | POA: Diagnosis not present

## 2015-07-24 DIAGNOSIS — J189 Pneumonia, unspecified organism: Secondary | ICD-10-CM | POA: Diagnosis present

## 2015-07-24 DIAGNOSIS — Z823 Family history of stroke: Secondary | ICD-10-CM

## 2015-07-24 DIAGNOSIS — E870 Hyperosmolality and hypernatremia: Secondary | ICD-10-CM | POA: Diagnosis not present

## 2015-07-24 DIAGNOSIS — J181 Lobar pneumonia, unspecified organism: Principal | ICD-10-CM | POA: Diagnosis present

## 2015-07-24 DIAGNOSIS — I482 Chronic atrial fibrillation: Secondary | ICD-10-CM | POA: Diagnosis not present

## 2015-07-24 DIAGNOSIS — I5031 Acute diastolic (congestive) heart failure: Secondary | ICD-10-CM | POA: Diagnosis not present

## 2015-07-24 DIAGNOSIS — D638 Anemia in other chronic diseases classified elsewhere: Secondary | ICD-10-CM | POA: Diagnosis present

## 2015-07-24 DIAGNOSIS — R229 Localized swelling, mass and lump, unspecified: Principal | ICD-10-CM

## 2015-07-24 DIAGNOSIS — J90 Pleural effusion, not elsewhere classified: Secondary | ICD-10-CM | POA: Diagnosis not present

## 2015-07-24 DIAGNOSIS — Z825 Family history of asthma and other chronic lower respiratory diseases: Secondary | ICD-10-CM

## 2015-07-24 DIAGNOSIS — J690 Pneumonitis due to inhalation of food and vomit: Secondary | ICD-10-CM

## 2015-07-24 DIAGNOSIS — R05 Cough: Secondary | ICD-10-CM | POA: Diagnosis not present

## 2015-07-24 DIAGNOSIS — R918 Other nonspecific abnormal finding of lung field: Secondary | ICD-10-CM | POA: Diagnosis present

## 2015-07-24 DIAGNOSIS — Z8262 Family history of osteoporosis: Secondary | ICD-10-CM

## 2015-07-24 DIAGNOSIS — G250 Essential tremor: Secondary | ICD-10-CM | POA: Diagnosis present

## 2015-07-24 DIAGNOSIS — I110 Hypertensive heart disease with heart failure: Secondary | ICD-10-CM | POA: Diagnosis present

## 2015-07-24 DIAGNOSIS — Z79899 Other long term (current) drug therapy: Secondary | ICD-10-CM

## 2015-07-24 DIAGNOSIS — J9601 Acute respiratory failure with hypoxia: Secondary | ICD-10-CM | POA: Diagnosis not present

## 2015-07-24 DIAGNOSIS — Z66 Do not resuscitate: Secondary | ICD-10-CM | POA: Diagnosis present

## 2015-07-24 DIAGNOSIS — Z6820 Body mass index (BMI) 20.0-20.9, adult: Secondary | ICD-10-CM

## 2015-07-24 DIAGNOSIS — F0390 Unspecified dementia without behavioral disturbance: Secondary | ICD-10-CM | POA: Diagnosis present

## 2015-07-24 DIAGNOSIS — R0902 Hypoxemia: Secondary | ICD-10-CM

## 2015-07-24 DIAGNOSIS — I10 Essential (primary) hypertension: Secondary | ICD-10-CM | POA: Diagnosis not present

## 2015-07-24 DIAGNOSIS — Z7901 Long term (current) use of anticoagulants: Secondary | ICD-10-CM

## 2015-07-24 DIAGNOSIS — G252 Other specified forms of tremor: Secondary | ICD-10-CM | POA: Diagnosis present

## 2015-07-24 DIAGNOSIS — I495 Sick sinus syndrome: Secondary | ICD-10-CM | POA: Diagnosis present

## 2015-07-24 DIAGNOSIS — I5021 Acute systolic (congestive) heart failure: Secondary | ICD-10-CM

## 2015-07-24 DIAGNOSIS — E785 Hyperlipidemia, unspecified: Secondary | ICD-10-CM | POA: Diagnosis present

## 2015-07-24 DIAGNOSIS — I4821 Permanent atrial fibrillation: Secondary | ICD-10-CM | POA: Diagnosis present

## 2015-07-24 DIAGNOSIS — Z887 Allergy status to serum and vaccine status: Secondary | ICD-10-CM

## 2015-07-24 DIAGNOSIS — Y95 Nosocomial condition: Secondary | ICD-10-CM | POA: Diagnosis present

## 2015-07-24 DIAGNOSIS — F419 Anxiety disorder, unspecified: Secondary | ICD-10-CM | POA: Diagnosis present

## 2015-07-24 DIAGNOSIS — Z8701 Personal history of pneumonia (recurrent): Secondary | ICD-10-CM

## 2015-07-24 DIAGNOSIS — Z8673 Personal history of transient ischemic attack (TIA), and cerebral infarction without residual deficits: Secondary | ICD-10-CM

## 2015-07-24 DIAGNOSIS — F17200 Nicotine dependence, unspecified, uncomplicated: Secondary | ICD-10-CM | POA: Diagnosis not present

## 2015-07-24 DIAGNOSIS — R000 Tachycardia, unspecified: Secondary | ICD-10-CM | POA: Diagnosis not present

## 2015-07-24 DIAGNOSIS — IMO0002 Reserved for concepts with insufficient information to code with codable children: Secondary | ICD-10-CM

## 2015-07-24 DIAGNOSIS — E860 Dehydration: Secondary | ICD-10-CM | POA: Diagnosis present

## 2015-07-24 LAB — URINALYSIS, ROUTINE W REFLEX MICROSCOPIC
Bilirubin Urine: NEGATIVE
GLUCOSE, UA: NEGATIVE mg/dL
KETONES UR: 15 mg/dL — AB
Leukocytes, UA: NEGATIVE
Nitrite: NEGATIVE
PROTEIN: NEGATIVE mg/dL
Specific Gravity, Urine: 1.036 — ABNORMAL HIGH (ref 1.005–1.030)
pH: 7.5 (ref 5.0–8.0)

## 2015-07-24 LAB — CBC
HCT: 32 % — ABNORMAL LOW (ref 36.0–46.0)
Hemoglobin: 10.8 g/dL — ABNORMAL LOW (ref 12.0–15.0)
MCH: 31.8 pg (ref 26.0–34.0)
MCHC: 33.8 g/dL (ref 30.0–36.0)
MCV: 94.1 fL (ref 78.0–100.0)
PLATELETS: 408 10*3/uL — AB (ref 150–400)
RBC: 3.4 MIL/uL — ABNORMAL LOW (ref 3.87–5.11)
RDW: 13.6 % (ref 11.5–15.5)
WBC: 6.5 10*3/uL (ref 4.0–10.5)

## 2015-07-24 LAB — BASIC METABOLIC PANEL WITH GFR
Anion gap: 10 (ref 5–15)
BUN: 7 mg/dL (ref 6–20)
CO2: 26 mmol/L (ref 22–32)
Calcium: 8.7 mg/dL — ABNORMAL LOW (ref 8.9–10.3)
Chloride: 91 mmol/L — ABNORMAL LOW (ref 101–111)
Creatinine, Ser: 0.51 mg/dL (ref 0.44–1.00)
GFR calc Af Amer: >60 mL/min (ref >60)
GFR calc non Af Amer: >60 mL/min (ref >60)
Glucose, Bld: 138 mg/dL — ABNORMAL HIGH (ref 65–99)
Potassium: 3.4 mmol/L — ABNORMAL LOW (ref 3.5–5.1)
Sodium: 127 mmol/L — ABNORMAL LOW (ref 135–145)

## 2015-07-24 LAB — I-STAT TROPONIN, ED: TROPONIN I, POC: 0.01 ng/mL (ref 0.00–0.08)

## 2015-07-24 LAB — URINE MICROSCOPIC-ADD ON: WBC, UA: NONE SEEN WBC/hpf (ref 0–5)

## 2015-07-24 LAB — TSH: TSH: 0.937 u[IU]/mL (ref 0.350–4.500)

## 2015-07-24 LAB — I-STAT CG4 LACTIC ACID, ED
Lactic Acid, Venous: 1.53 mmol/L (ref 0.5–2.0)
Lactic Acid, Venous: 1.26 mmol/L (ref 0.5–2.0)

## 2015-07-24 LAB — BRAIN NATRIURETIC PEPTIDE: B Natriuretic Peptide: 343.9 pg/mL — ABNORMAL HIGH (ref 0.0–100.0)

## 2015-07-24 LAB — PROCALCITONIN: Procalcitonin: <0.1 ng/mL

## 2015-07-24 MED ORDER — ATENOLOL 50 MG PO TABS: 25.0000 mg | ORAL_TABLET | Freq: Every day | ORAL | Status: DC

## 2015-07-24 MED ORDER — SODIUM CHLORIDE 0.9% FLUSH: 3.0000 mL | INTRAVENOUS | Status: DC | PRN

## 2015-07-24 MED ORDER — SODIUM CHLORIDE 0.9% FLUSH
3.0000 mL | Freq: Two times a day (BID) | INTRAVENOUS | Status: DC
  Administered 2015-07-25 – 2015-07-30 (×9): 3 mL via INTRAVENOUS

## 2015-07-24 MED ORDER — POLYVINYL ALCOHOL 1.4 % OP SOLN
1.0000 [drp] | OPHTHALMIC | Status: DC | PRN
  Filled 2015-07-24: qty 15

## 2015-07-24 MED ORDER — FLUOXETINE HCL 20 MG PO CAPS
20.0000 mg | ORAL_CAPSULE | Freq: Every day | ORAL | Status: DC
  Administered 2015-07-25 – 2015-07-30 (×6): 20 mg via ORAL
  Filled 2015-07-24 (×6): qty 1

## 2015-07-24 MED ORDER — SODIUM CHLORIDE 0.9 % IV SOLN: 250.0000 mL | INTRAVENOUS | Status: DC | PRN

## 2015-07-24 MED ORDER — DILTIAZEM HCL 25 MG/5ML IV SOLN
10.0000 mg | Freq: Once | INTRAVENOUS | Status: DC | PRN
  Filled 2015-07-24: qty 5

## 2015-07-24 MED ORDER — ACETAMINOPHEN 325 MG PO TABS: 650.0000 mg | ORAL_TABLET | ORAL | Status: DC | PRN

## 2015-07-24 MED ORDER — SODIUM CHLORIDE 0.9 % IV SOLN
500.0000 mg | Freq: Two times a day (BID) | INTRAVENOUS | Status: DC
  Administered 2015-07-25: 500 mg via INTRAVENOUS
  Filled 2015-07-24 (×3): qty 500

## 2015-07-24 MED ORDER — SODIUM CHLORIDE 0.9 % IV SOLN: INTRAVENOUS | Status: DC

## 2015-07-24 MED ORDER — SODIUM CHLORIDE 0.9% FLUSH
3.0000 mL | Freq: Two times a day (BID) | INTRAVENOUS | Status: DC
  Administered 2015-07-24 – 2015-07-29 (×10): 3 mL via INTRAVENOUS

## 2015-07-24 MED ORDER — TRAZODONE HCL 50 MG PO TABS: 25.0000 mg | ORAL_TABLET | Freq: Every evening | ORAL | Status: DC | PRN

## 2015-07-24 MED ORDER — MAGNESIUM CITRATE PO SOLN: 1.0000 | Freq: Once | ORAL | Status: DC | PRN

## 2015-07-24 MED ORDER — IOPAMIDOL (ISOVUE-300) INJECTION 61%
75.0000 mL | Freq: Once | INTRAVENOUS | Status: AC | PRN
  Administered 2015-07-24: 75 mL via INTRAVENOUS

## 2015-07-24 MED ORDER — FUROSEMIDE 10 MG/ML IJ SOLN
20.0000 mg | Freq: Two times a day (BID) | INTRAMUSCULAR | Status: AC
  Administered 2015-07-25 (×2): 20 mg via INTRAVENOUS
  Filled 2015-07-24 (×2): qty 2

## 2015-07-24 MED ORDER — CARVEDILOL 12.5 MG PO TABS
12.5000 mg | ORAL_TABLET | Freq: Two times a day (BID) | ORAL | Status: DC
  Administered 2015-07-25 (×2): 12.5 mg via ORAL
  Filled 2015-07-24 (×3): qty 1

## 2015-07-24 MED ORDER — ONDANSETRON HCL 4 MG PO TABS: 4.0000 mg | ORAL_TABLET | Freq: Four times a day (QID) | ORAL | Status: DC | PRN

## 2015-07-24 MED ORDER — DEXTROSE 5 % IV SOLN
1.0000 g | Freq: Once | INTRAVENOUS | Status: AC
  Administered 2015-07-24: 1 g via INTRAVENOUS
  Filled 2015-07-24: qty 1

## 2015-07-24 MED ORDER — DILTIAZEM HCL ER COATED BEADS 360 MG PO CP24
360.0000 mg | ORAL_CAPSULE | Freq: Every day | ORAL | Status: DC
  Administered 2015-07-25: 360 mg via ORAL
  Filled 2015-07-24: qty 2
  Filled 2015-07-24 (×2): qty 1
  Filled 2015-07-24: qty 2
  Filled 2015-07-24: qty 1

## 2015-07-24 MED ORDER — SIMVASTATIN 40 MG PO TABS: 40.0000 mg | ORAL_TABLET | Freq: Every day | ORAL | Status: DC

## 2015-07-24 MED ORDER — SENNOSIDES-DOCUSATE SODIUM 8.6-50 MG PO TABS: 1.0000 | ORAL_TABLET | Freq: Every evening | ORAL | Status: DC | PRN

## 2015-07-24 MED ORDER — FUROSEMIDE 10 MG/ML IJ SOLN: 40.0000 mg | Freq: Two times a day (BID) | INTRAMUSCULAR | Status: DC

## 2015-07-24 MED ORDER — ALPRAZOLAM 0.25 MG PO TABS: 0.2500 mg | ORAL_TABLET | Freq: Three times a day (TID) | ORAL | Status: DC | PRN

## 2015-07-24 MED ORDER — DEXTROSE 5 % IV SOLN
1.0000 g | Freq: Three times a day (TID) | INTRAVENOUS | Status: DC
  Administered 2015-07-24 – 2015-07-25 (×2): 1 g via INTRAVENOUS
  Filled 2015-07-24 (×5): qty 1

## 2015-07-24 MED ORDER — RIVAROXABAN 20 MG PO TABS: 20.0000 mg | ORAL_TABLET | Freq: Every day | ORAL | Status: DC

## 2015-07-24 MED ORDER — VANCOMYCIN HCL 10 G IV SOLR
1250.0000 mg | Freq: Once | INTRAVENOUS | Status: AC
  Administered 2015-07-24: 1250 mg via INTRAVENOUS
  Filled 2015-07-24: qty 1250

## 2015-07-24 MED ORDER — ONDANSETRON HCL 4 MG/2ML IJ SOLN: 4.0000 mg | Freq: Four times a day (QID) | INTRAMUSCULAR | Status: DC | PRN

## 2015-07-24 MED ORDER — ATORVASTATIN CALCIUM 20 MG PO TABS
20.0000 mg | ORAL_TABLET | Freq: Every day | ORAL | Status: DC
  Administered 2015-07-24 – 2015-07-29 (×6): 20 mg via ORAL
  Filled 2015-07-24 (×6): qty 1

## 2015-07-24 MED ORDER — ACETAMINOPHEN 650 MG RE SUPP: 650.0000 mg | Freq: Four times a day (QID) | RECTAL | Status: DC | PRN

## 2015-07-24 MED ORDER — POTASSIUM CHLORIDE CRYS ER 20 MEQ PO TBCR
40.0000 meq | EXTENDED_RELEASE_TABLET | Freq: Once | ORAL | Status: AC
  Administered 2015-07-25: 40 meq via ORAL
  Filled 2015-07-24: qty 2

## 2015-07-24 MED ORDER — BISACODYL 10 MG RE SUPP: 10.0000 mg | Freq: Every day | RECTAL | Status: DC | PRN

## 2015-07-24 MED ORDER — ACETAMINOPHEN 325 MG PO TABS
650.0000 mg | ORAL_TABLET | Freq: Four times a day (QID) | ORAL | Status: DC | PRN
Start: 2015-07-24 — End: 2015-07-30

## 2015-07-24 MED ORDER — MEMANTINE HCL 10 MG PO TABS
10.0000 mg | ORAL_TABLET | Freq: Two times a day (BID) | ORAL | Status: DC
  Administered 2015-07-24 – 2015-07-30 (×12): 10 mg via ORAL
  Filled 2015-07-24 (×12): qty 1

## 2015-07-24 MED ORDER — HYDROCODONE-ACETAMINOPHEN 5-325 MG PO TABS: 1.0000 | ORAL_TABLET | ORAL | Status: DC | PRN

## 2015-07-24 MED ORDER — FUROSEMIDE 10 MG/ML IJ SOLN
40.0000 mg | Freq: Once | INTRAMUSCULAR | Status: AC
  Administered 2015-07-24: 40 mg via INTRAVENOUS
  Filled 2015-07-24: qty 4

## 2015-07-24 NOTE — H&P (Signed)
History and Physical    Megan Munoz [id number] DOB: [birth year] DOA: 07/24/2015  Referring MD/NP/PA: EDP PCP: [name], MD  Outpatient Specialists:  Patient coming from:  Home   Chief Complaint: Shortness of Breath   HPI: Megan Munoz is a 82 y.o. 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 She was in [place] 2 weeks ago, at which time she was diagnosed with pneumonia, admitted to the local hospital where she remained for 6 days for treatment. She was released on Cefdinir. At the time, she was instructed to follow-up with her primary care physician. In the interim, she experienced a 20 pound weight gain since discharge, as well as increasing shortness of breath at the point that she cannot lie flat anymore. She can only sleep sitting vertically. In addition, she reports significant swelling in his in her legs bilaterally. She was seen by her primary care physician yesterday, at which time labs were performed, showing a BNP of 2954. Chest x-ray showed multifocal pneumonia, with superimposed interstitial edema and moderate right, and small left pleural effusions. Follow-up CT today confirmed the findings. In addition, suspicious mass was seen in the right apical area. She was instructed to present to the emergency Department for further workup and management of her symptoms. Denies fevers, chills, night sweats, vision changes, or mucositis.Denies any chest pain or palpitations. Denies nausea, heartburn or change in bowel habits. Denies abdominal pain. Appetite is decreased due to symptoms. Denies any dysuria. Denies abnormal skin rashes Denies any bleeding issues such as epistaxis, hematemesis, hematuria or hematochezia. Denies any sick contacts.   ED Course: on presentation, she received IV Lasix 40 mg without significant  improvement of her symptoms, remaining tachycardic and tachypneic. She is a favorable, and her blood pressure is controlled. BP 153/87 mmHg  Pulse 75  Temp(Src) 97.5 F (36.4 C) (Oral)  Resp 27  Wt 66.282 kg (146 lb 2 oz)  SpO2 95% CBCshows normal white count at 6.8, her C METremarkable for sodium of 127. EKG shows atrial fibrillation with left axis deviation, and QTc 464.  Review of Systems: As per HPI otherwise 10 point review of systems negative.    Past Medical History  Diagnosis Date  . Anxiety   . Cerebral infarction [facility]) 10/2008    MRI Brain 05/2014 for near syncope revealed: No acure infarct.  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   Mild small vessel disease type changes.  No intracranial hemorrhage.  Global atrophy.   . A-fib ([place]) 03/2013    On Atenolol for Rate Control & Xarelto for AC.  Marland Kitchen Hypertension   . Tinea unguium   . Hyperlipemia   . Tremor     Past Surgical History  Procedure Laterality Date  . Carpal tunnel release Left   . Total abdominal hysterectomy    . Foot surgery Right   . Bladder surgery    . Hernia repair    . Nasal sinus surgery       reports that she has quit smoking. She does not have any smokeless tobacco history on file. She reports that she does not drink alcohol or use illicit drugs.  Allergies  Allergen Reactions  . Tetanus Toxoids Other (See Comments)    Told never to take     Family  History  Problem Relation Age of Onset  . Osteoporosis Mother   . Emphysema Father   . Stroke Brother   . Cancer Maternal Grandmother   . Asthma Daughter     allergy induced    Family history reviewed and not pertinent (If you reviewed it)  Prior to Admission medications   Medication Sig Start Date End Date Taking? Authorizing Provider  albuterol (ACCUNEB) 0.63 MG/3ML nebulizer solution Take 3 mLs by nebulization every 6 (six) hours as needed for  wheezing or shortness of breath.  07/03/14   Historical Provider, MD  atenolol (TENORMIN) 25 MG tablet Take 1 tablet (25 mg total) by mouth daily. 07/16/14   Leonie Man, MD  B Complex Vitamins (VITAMIN B COMPLEX PO) Take 1 tablet by mouth daily.     Historical Provider, MD  Calcium Carb-Cholecalciferol (CALCIUM + D3) 600-200 MG-UNIT TABS Take 1-2 tablets by mouth 2 (two) times daily. 2 tablets in the morning and 1 tablet in the evening.    Historical Provider, MD  diphenhydrAMINE (SOMINEX) 25 MG tablet Take 50 mg by mouth at bedtime.     Historical Provider, MD  ferrous sulfate 325 (65 FE) MG tablet Take 325 mg by mouth daily with breakfast.    Historical Provider, MD  Flaxseed, Linseed, (EQL FLAX SEED OIL) 1000 MG CAPS Take 1,000 mg by mouth daily.     Historical Provider, MD  FLUoxetine (PROZAC) 20 MG capsule Take 20 mg by mouth daily.    Historical Provider, MD  loratadine (CLARITIN) 10 MG tablet Take 10 mg by mouth daily.    Historical Provider, MD  Melatonin 10 MG TABS Take 10 mg by mouth at bedtime.     Historical Provider, MD  memantine (NAMENDA) 10 MG tablet Take 1 tablet (10 mg total) by mouth 2 (two) times daily. 05/01/15   Marcial Pacas, MD  Multiple Vitamin (MULTIVITAMIN) capsule Take 1 capsule by mouth daily.    Historical Provider, MD  primidone (MYSOLINE) 250 MG tablet Take 250 mg by mouth 2 (two) times daily.     Historical Provider, MD  rivaroxaban (XARELTO) 20 MG TABS tablet Take 20 mg by mouth daily with supper.    Historical Provider, MD  simvastatin (ZOCOR) 40 MG tablet Take 40 mg by mouth daily.    Historical Provider, MD    Physical Exam:    Filed Vitals:   07/24/15 1314 07/24/15 1315 07/24/15 1349 07/24/15 1430  BP: 128/67   153/87  Pulse: 75     Temp: 97.5 F (36.4 C)     TempSrc: Oral     Resp: 22   27  Weight:   66.282 kg (146 lb 2 oz)   SpO2: 78% 95%        Constitutional: moderate respiratory distress, uncomfortable   Filed Vitals:   07/24/15 1314  07/24/15 1315 07/24/15 1349 07/24/15 1430  BP: 128/67   153/87  Pulse: 75     Temp: 97.5 F (36.4 C)     TempSrc: Oral     Resp: 22   27  Weight:   66.282 kg (146 lb 2 oz)   SpO2: 78% 95%     Eyes: PERRL, lids and conjunctivae normal ENMT: Mucous membranes are moist. Posterior pharynx clear of any exudate or lesions. Normal dentition.  Neck: normal, supple, no masses, no thyromegaly Respiratory: decreased breath sounds at the bases, r> L no wheezing, no crackles. Significant  respiratory effort noted. No accessory muscle use.  Cardiovascular: Tachy  irregularly irregular rate and rhythm, no murmurs / rubs / gallops. 2+ extremity edema. 2+ pedal pulses. No carotid bruits.  Abdomen: no tenderness, no masses palpated. No hepatosplenomegaly. Bowel sounds positive.  Musculoskeletal: no clubbing / cyanosis. Noted arthritic joint deformity upper and lower extremities. Good ROM, no contractures. Normal muscle tone.  Skin: no rashes, lesions, ulcers. No induration Neurologic: CN 2-12 grossly intact. Sensation intact, DTR normal. Strength 5/5 in all 4. Resting tremor Psychiatric: Normal judgment and insight. Alert and oriented x 3. Normal  mood. Flat affect    Labs on Admission: I have personally reviewed following labs and imaging studies  CBC:  Recent Labs Lab 07/24/15 1330  WBC 6.5  HGB 10.8*  HCT 32.0*  MCV 94.1  PLT 408*    Basic Metabolic Panel:  Recent Labs Lab 07/24/15 1330  NA 127*  K 3.4*  CL 91*  CO2 26  GLUCOSE 138*  BUN 7  CREATININE 0.51  CALCIUM 8.7*    GFR: CrCl cannot be calculated (Unknown ideal weight.).   Urine analysis:    Component Value Date/Time   COLORURINE YELLOW 07/24/2015 1345   APPEARANCEUR CLOUDY* 07/24/2015 1345   LABSPEC 1.036* 07/24/2015 1345   PHURINE 7.5 07/24/2015 1345   GLUCOSEU NEGATIVE 07/24/2015 1345   HGBUR SMALL* 07/24/2015 1345   BILIRUBINUR NEGATIVE 07/24/2015 1345   KETONESUR 15* 07/24/2015 1345   PROTEINUR NEGATIVE  07/24/2015 1345   UROBILINOGEN 0.2 07/07/2014 1451   NITRITE NEGATIVE 07/24/2015 1345   LEUKOCYTESUR NEGATIVE 07/24/2015 1345    Sepsis Labs: @LABRCNTIP (procalcitonin:4,lacticidven:4) )No results found for this or any previous visit (from the past 240 hour(s)).   Radiological Exams on Admission: Dg Chest 2 View  07/23/2015  CLINICAL DATA:  Follow-up pneumonia.  Severe shortness of breath. EXAM: CHEST  2 VIEW COMPARISON:  Chest radiograph 07/07/2014 FINDINGS: Stable cardiac and mediastinal contours. Scattered patchy areas of consolidation throughout the right-greater-than-left lungs. Small bilateral pleural effusions, right-greater-than-left. Interval progression of right-greater-than-left biapical pleural parenchymal thickening. Additionally there is pleural thickening along the left upper lateral hemi thorax. Regional skeleton is unremarkable. IMPRESSION: Right-greater-than-left patchy consolidative opacities concerning for multi focal pneumonia in the appropriate clinical setting. Right-greater-than-left small bilateral pleural effusions. Interval worsening right-greater-than-left biapical pleural parenchymal thickening which is nonspecific and may be secondary to scarring or infection. Malignancy is not excluded. Given the multitude of findings, many of which are progressed or worsened from prior chest radiograph, short-term followup radiograph in 4- 6 weeks is recommended. If these findings persist, contrast-enhanced chest CT is recommended. Electronically Signed   By: Lovey Newcomer M.D.   On: 07/23/2015 13:46   Ct Chest W Contrast  07/24/2015  CLINICAL DATA:  Severe shortness of breath, cough, evaluate for multifocal pneumonia versus mass EXAM: CT CHEST WITH CONTRAST TECHNIQUE: Multidetector CT imaging of the chest was performed during intravenous contrast administration. CONTRAST:  64mL ISOVUE-300 IOPAMIDOL (ISOVUE-300) INJECTION 61% COMPARISON:  Chest radiographs dated 07/23/2015 FINDINGS:  Mediastinum/Nodes: The heart is top-normal in size. No pericardial effusion. Coronary atherosclerosis. Atherosclerotic calcifications of the aortic arch. Small mediastinal lymph nodes, including a 12 mm short axis low right paratracheal node (series 3/ image 51), favored to be reactive. Visualized thyroid is unremarkable. Lungs/Pleura: Evaluation of the lung parenchyma is constrained by respiratory motion. Multifocal patchy/ground-glass opacities, with associated interlobular septal thickening in the bilateral upper lobes. This appearance favors multifocal infection with superimposed interstitial edema. Associated moderate right and small left pleural effusions. Mild compressive atelectasis in the bilateral lower lobes. Suspected biapical pleural-parenchymal  scarring, right greater than left. However, an underlying right apical mass is difficult to entirely exclude given the surrounding apical fluid obscuring this region (series 4/ image 22). No pneumothorax. Upper abdomen: Visualized upper abdomen is notable for vascular calcifications. Musculoskeletal: Mild to moderate superior endplate compression fracture deformity at L1, unchanged. IMPRESSION: Suspected multifocal pneumonia with superimposed interstitial edema. Moderate right and small left pleural effusions. Suspected biapical pleural-parenchymal scarring, right greater than left. Underlying right apical mass is difficult to entirely exclude given the surrounding fluid on the current study. Consider follow-up CT chest in 3 months after resolution of acute illness. Electronically Signed   By: Julian Hy M.D.   On: 07/24/2015 10:32    EKG: Independently reviewed.  Assessment/Plan Active Problems:   Permanent atrial fibrillation (HCC)   Hyperlipidemia   Essential hypertension   HCAP (healthcare-associated pneumonia)   Acute respiratory failure with hypoxia (HCC)   Lung mass   Acute respiratory failure with hypoxia likely due to acute  CHF  exacerbation, Healthcare associated pneumonia, infectious vs post obstructive in view of recent right apical mass seen on CT. PAtient recently treated for CAP in [place], d/cd on oral antibiotics, now presenting with worsening symptoms. Received 40 mg IV Lasix without significant improvement of respiratory status. She received Vanc and Maxxipime IV x1  BP 153/87 mmHg  Pulse 75  Temp(Src) 97.5 F (36.4 C) (Oral)  Resp 27  Wt 66.282 kg (146 lb 2 oz)  SpO2 95% Admit to tele obs CHF order set 2 D echo IV Lasix bid  TSH BNP May need Heart Failure consultation pending on echo results for management Pneumonia order set Antibiotics per protocol with Vanc Cefepime Blood Cultures Strep Pneumo Ag Legionella urine Ag O2 supplementation Xopenex  Atrial Fibrillation EKG with Afib nl QTC  Initial rate Rate 133 then improved to 75 bpm Check 2 D echo Continue Xarelto and Coreg, Cardizem. Will d/c Atenolol, will initiate low dose Cardizem   Hyperlipidemia Continue home statins  Anemia of chronic disease.   Hemoglobin10.8  on admission. No transfusion indicated at this time Repeat  CBC in am Continue Iron supplement  Hyponatremia in the setting of CHF and possible lung mass, and with volume overload and poor nutritional status  No new neurological deficit Current NA 127  Continue diuresis Gentle hydration NS at 50 cc/ hr Consider SIADH w/u if values do not improve  New lung mass with bibasilar pleural effusion. CT incidental Right apical mass and effusion Consider CT once symptoms improve to better visualize abnormal findings If effusion is persistent, will proceed with diagnostic thoracentesis     DVT prophylaxis:Xarelto  Code Status:   DNR  Family Communication:  Discussed with daughter Disposition Plan: Expect patient to be discharged to home  Consults called:    None Admission status: Obs Tele   Sharene Butters E, PA-C [facility]   If 7PM-7AM, please contact  night-coverage [email or web address] Password [facility]  07/24/2015, 4:41 PM

## 2015-07-24 NOTE — ED Notes (Signed)
Pt placed on 2lpm O2 by N/C due to low O2 stats

## 2015-07-24 NOTE — ED Provider Notes (Signed)
CSN: [id number]     Arrival date & time 07/24/15  1257 History   First MD Initiated Contact with Patient 07/24/15 1413     Chief Complaint  Patient presents with  . Shortness of Breath     (Consider location/radiation/quality/duration/timing/severity/associated sxs/prior Treatment) Patient is a 82 y.o. female presenting with shortness of breath. The history is provided by the patient and a relative.  Shortness of Breath Associated symptoms: no chest pain, no cough, no fever, no headaches, no neck pain, no rash and no vomiting    Patient is an 82 year old female with a history of stroke in 2010, A. Fib, hypertension who presents to the ED with shortness of breath. Patient's stroke affected her frontal lobe and basal ganglia therefore her daughter gave most of the history. Patient was in [place] 2 weeks ago where she was diagnosed with pneumonia and admitted to the hospital for 6 days. She was released on cefdinir. She has had 20 pound weight gain since her discharge. She's had increased shortness of breath since her discharge. She denies having any pain. She endorses swelling of her legs. She was at her primary care provider today who did labs which revealed a BNP of 2954. They did a chest x-ray and a CT which revealed suspected multi focal pneumonia with superimposed interstitial edema and moderate right and small left pleural effusions. She denies fever, chest pain, dizziness, syncope, nausea, vomiting, weakness. Her daughter states the patient requires multiple pillows to sleep at night.   Past Medical History  Diagnosis Date  . Anxiety   . Cerebral infarction [facility]) 10/2008    MRI Brain 05/2014 for near syncope revealed: No acure infarct.  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   Mild small vessel disease type changes.  No intracranial hemorrhage.  Global atrophy.   . A-fib ([place]) 03/2013    On  Atenolol for Rate Control & Xarelto for AC.  Marland Kitchen Hypertension   . Tinea unguium   . Hyperlipemia   . Tremor    Past Surgical History  Procedure Laterality Date  . Carpal tunnel release Left   . Total abdominal hysterectomy    . Foot surgery Right   . Bladder surgery    . Hernia repair    . Nasal sinus surgery     Family History  Problem Relation Age of Onset  . Osteoporosis Mother   . Emphysema Father   . Stroke Brother   . Cancer Maternal Grandmother   . Asthma Daughter     allergy induced   Social History  Substance Use Topics  . Smoking status: Former Research scientist (life sciences)  . Smokeless tobacco: None  . Alcohol Use: No   OB History    No data available     Review of Systems  Constitutional: Negative for fever and chills.  HENT: Negative for trouble swallowing.   Respiratory: Positive for shortness of breath. Negative for cough and chest tightness.   Cardiovascular: Positive for leg swelling. Negative for chest pain.  Gastrointestinal: Negative for nausea and vomiting.  Genitourinary: Negative for dysuria and hematuria.  Musculoskeletal: Negative for back pain and neck pain.  Skin: Negative for rash.  Neurological: Negative for dizziness, syncope, numbness and headaches.  All other systems reviewed and are negative.     Allergies  Tetanus toxoids  Home Medications   Prior to Admission medications   Medication Sig Start Date End Date Taking? Authorizing  Provider  albuterol (ACCUNEB) 0.63 MG/3ML nebulizer solution Take 3 mLs by nebulization every 6 (six) hours as needed for wheezing or shortness of breath.  07/03/14   Historical Provider, MD  atenolol (TENORMIN) 25 MG tablet Take 1 tablet (25 mg total) by mouth daily. 07/16/14   Leonie Man, MD  B Complex Vitamins (VITAMIN B COMPLEX PO) Take 1 tablet by mouth daily.     Historical Provider, MD  Calcium Carb-Cholecalciferol (CALCIUM + D3) 600-200 MG-UNIT TABS Take 1-2 tablets by mouth 2 (two) times daily. 2 tablets in the  morning and 1 tablet in the evening.    Historical Provider, MD  diphenhydrAMINE (SOMINEX) 25 MG tablet Take 50 mg by mouth at bedtime.     Historical Provider, MD  ferrous sulfate 325 (65 FE) MG tablet Take 325 mg by mouth daily with breakfast.    Historical Provider, MD  Flaxseed, Linseed, (EQL FLAX SEED OIL) 1000 MG CAPS Take 1,000 mg by mouth daily.     Historical Provider, MD  FLUoxetine (PROZAC) 20 MG capsule Take 20 mg by mouth daily.    Historical Provider, MD  loratadine (CLARITIN) 10 MG tablet Take 10 mg by mouth daily.    Historical Provider, MD  Melatonin 10 MG TABS Take 10 mg by mouth at bedtime.     Historical Provider, MD  memantine (NAMENDA) 10 MG tablet Take 1 tablet (10 mg total) by mouth 2 (two) times daily. 05/01/15   Marcial Pacas, MD  Multiple Vitamin (MULTIVITAMIN) capsule Take 1 capsule by mouth daily.    Historical Provider, MD  primidone (MYSOLINE) 250 MG tablet Take 250 mg by mouth 2 (two) times daily.     Historical Provider, MD  rivaroxaban (XARELTO) 20 MG TABS tablet Take 20 mg by mouth daily with supper.    Historical Provider, MD  simvastatin (ZOCOR) 40 MG tablet Take 40 mg by mouth daily.    Historical Provider, MD   BP 153/87 mmHg  Pulse 75  Temp(Src) 97.5 F (36.4 C) (Oral)  Resp 27  Wt 66.282 kg  SpO2 95% Physical Exam  Constitutional: She appears well-developed and well-nourished. No distress.  Thin elderly woman  HENT:  Head: Normocephalic and atraumatic.  Mouth/Throat: Uvula is midline, oropharynx is clear and moist and mucous membranes are normal.  Eyes: Conjunctivae are normal. Pupils are equal, round, and reactive to light.  Neck: Trachea normal and normal range of motion. Neck supple.  Cardiovascular: An irregularly irregular rhythm present. Tachycardia present.   No murmur heard. Pulses:      Radial pulses are 2+ on the right side, and 2+ on the left side.       Posterior tibial pulses are 1+ on the right side, and 1+ on the left side.   Pulmonary/Chest: Effort normal and breath sounds normal.  Abdominal: Soft. Normal appearance and bowel sounds are normal. There is tenderness in the right lower quadrant.  Musculoskeletal: She exhibits edema.  2+ pitting edema of bilateral lower extremities  Lymphadenopathy:    She has no cervical adenopathy.  Neurological: She is alert. Coordination normal.  Skin: Skin is warm and dry. She is not diaphoretic.  Psychiatric:  Daughter states judgment is altered from patient's frontal stroke in 2010.    ED Course  Procedures (including critical care time) Labs Review Labs Reviewed  BASIC METABOLIC PANEL - Abnormal; Notable for the following:    Sodium 127 (*)    Potassium 3.4 (*)    Chloride 91 (*)  Glucose, Bld 138 (*)    Calcium 8.7 (*)    All other components within normal limits  CBC - Abnormal; Notable for the following:    RBC 3.40 (*)    Hemoglobin 10.8 (*)    HCT 32.0 (*)    Platelets 408 (*)    All other components within normal limits  URINALYSIS, ROUTINE W REFLEX MICROSCOPIC (NOT AT [facility]) - Abnormal; Notable for the following:    APPearance CLOUDY (*)    Specific Gravity, Urine 1.036 (*)    Hgb urine dipstick SMALL (*)    Ketones, ur 15 (*)    All other components within normal limits  URINE MICROSCOPIC-ADD ON - Abnormal; Notable for the following:    Squamous Epithelial / LPF 0-5 (*)    Bacteria, UA FEW (*)    All other components within normal limits  URINE CULTURE  I-STAT TROPOININ, ED  I-STAT CG4 LACTIC ACID, ED    Imaging Review Dg Chest 2 View  07/23/2015  CLINICAL DATA:  Follow-up pneumonia.  Severe shortness of breath. EXAM: CHEST  2 VIEW COMPARISON:  Chest radiograph 07/07/2014 FINDINGS: Stable cardiac and mediastinal contours. Scattered patchy areas of consolidation throughout the right-greater-than-left lungs. Small bilateral pleural effusions, right-greater-than-left. Interval progression of right-greater-than-left biapical pleural parenchymal  thickening. Additionally there is pleural thickening along the left upper lateral hemi thorax. Regional skeleton is unremarkable. IMPRESSION: Right-greater-than-left patchy consolidative opacities concerning for multi focal pneumonia in the appropriate clinical setting. Right-greater-than-left small bilateral pleural effusions. Interval worsening right-greater-than-left biapical pleural parenchymal thickening which is nonspecific and may be secondary to scarring or infection. Malignancy is not excluded. Given the multitude of findings, many of which are progressed or worsened from prior chest radiograph, short-term followup radiograph in 4- 6 weeks is recommended. If these findings persist, contrast-enhanced chest CT is recommended. Electronically Signed   By: Lovey Newcomer M.D.   On: 07/23/2015 13:46   Ct Chest W Contrast  07/24/2015  CLINICAL DATA:  Severe shortness of breath, cough, evaluate for multifocal pneumonia versus mass EXAM: CT CHEST WITH CONTRAST TECHNIQUE: Multidetector CT imaging of the chest was performed during intravenous contrast administration. CONTRAST:  75mL ISOVUE-300 IOPAMIDOL (ISOVUE-300) INJECTION 61% COMPARISON:  Chest radiographs dated 07/23/2015 FINDINGS: Mediastinum/Nodes: The heart is top-normal in size. No pericardial effusion. Coronary atherosclerosis. Atherosclerotic calcifications of the aortic arch. Small mediastinal lymph nodes, including a 12 mm short axis low right paratracheal node (series 3/ image 51), favored to be reactive. Visualized thyroid is unremarkable. Lungs/Pleura: Evaluation of the lung parenchyma is constrained by respiratory motion. Multifocal patchy/ground-glass opacities, with associated interlobular septal thickening in the bilateral upper lobes. This appearance favors multifocal infection with superimposed interstitial edema. Associated moderate right and small left pleural effusions. Mild compressive atelectasis in the bilateral lower lobes. Suspected  biapical pleural-parenchymal scarring, right greater than left. However, an underlying right apical mass is difficult to entirely exclude given the surrounding apical fluid obscuring this region (series 4/ image 22). No pneumothorax. Upper abdomen: Visualized upper abdomen is notable for vascular calcifications. Musculoskeletal: Mild to moderate superior endplate compression fracture deformity at L1, unchanged. IMPRESSION: Suspected multifocal pneumonia with superimposed interstitial edema. Moderate right and small left pleural effusions. Suspected biapical pleural-parenchymal scarring, right greater than left. Underlying right apical mass is difficult to entirely exclude given the surrounding fluid on the current study. Consider follow-up CT chest in 3 months after resolution of acute illness. Electronically Signed   By: Julian Hy M.D.   On: 07/24/2015 10:32  I have personally reviewed and evaluated these images and lab results as part of my medical decision-making.   EKG Interpretation   Date/Time:  Wednesday July 24 2015 13:07:26 EDT Ventricular Rate:  133 PR Interval:    QRS Duration: 78 QT Interval:  312 QTC Calculation: 464 R Axis:   -90 Text Interpretation:  Undetermined rhythm Left axis deviation Septal  infarct , age undetermined Abnormal ECG Atrial fibrillation Left axis  deviation Abnormal ekg Confirmed by Carmin Muskrat  MD [phone]) on  07/24/2015 2:16:49 PM      MDM   Final diagnoses:  HCAP (healthcare-associated pneumonia)   Patient is an 82 year old female with a history of stroke in 2010, A. Fib, hypertension who presents to the ED with shortness of breath. Patient was admitted to a [place] hospital 2 weeks ago with pneumonia. She was released on Cefdinir which she is still taking. Her PCP today gave her a shot of Rocephin. He had a 20 pound weight gain after her discharge from the hospital [place]. Edema in her legs and shortness of breath is likely due to fluid  overload from her hospital stay and possible remaining pneumonia. She is afebrile but she has been on antibiotics for 2 weeks. She has no prior history of CHF. We will give her 40 mg of Lasix for her fluid overload. Sodium and chloride were found to be low but this is likely due to her hypervolemia. Will reassess after Lasix. Her potassium was found to be low so we will give her 40 mEq by mouth once and reassess labs.  She will be admitted for further observation and treatment.   Dr. Vanita Panda consulted the hospitalist team and [facility] will admit the patient for further observation and treatment.     Kalman Drape, PA 07/24/15 1624  Carmin Muskrat, MD 07/26/15 [phone]

## 2015-07-24 NOTE — Progress Notes (Signed)
Pharmacy Antibiotic Note  Megan Munoz is a 82 y.o. female admitted on 07/24/2015 with pneumonia.  Presents with fever after previous hospitalization of 6 days for pneumonia.  CT scan on 4/26 revealed pneumonia in all lobes, fluids, and unknown mass.  Received Rocephin at MD office and sent to ED.  Pharmacy has been consulted for Vancomycin dosing.  Also received cefepime in the ED.  On Admit: Afebrile, HR 75, RR 27, WBC 6.5 LA 1.53, SCr 0.51 (CrCl 59)  Plan: --Vancomycin 1250 mg IV x 1, then 500 mg IV q12h --Obtain vanc trough at steady state (goal 15-20) --Follow renal function, clinical course and cultures --Follow decision to continue cefepime   Weight: 146 lb 2 oz (66.282 kg)  Temp (24hrs), Avg:97.5 F (36.4 C), Min:97.5 F (36.4 C), Max:97.5 F (36.4 C)   Recent Labs Lab 07/24/15 1330 07/24/15 1352  WBC 6.5  --   CREATININE 0.51  --   LATICACIDVEN  --  1.53    CrCl cannot be calculated (Unknown ideal weight.).    Allergies  Allergen Reactions  . Tetanus Toxoids     Antimicrobials this admission: 4/26 Ceftriaxone x 1 4/26 Vanc >>  4/26 Cefepime x1  Dose adjustments this admission:   Microbiology results: 4/26 UCx:      Thank you for allowing pharmacy to be a part of this patient's care.  Viann Fish 07/24/2015 3:11 PM

## 2015-07-24 NOTE — Progress Notes (Signed)
Pt is requesting some eye drops because her eyes are dry. Text paged Dr.Sofia. No new orders at this time.

## 2015-07-24 NOTE — ED Notes (Addendum)
PT had fever and was hospitalized for 6 days with PNA.  Pt sent here with PNA in all lobes of lungs and fluid, has history of CHF.  PT had CT scan that showed a mass in the lung and they cannot tell what the mass is.  Pt had sats in the 70s and placed on 02/2L and sats to low 90s.  rECIEVED ROCEPHIN SHOT AT MD OFFICE

## 2015-07-25 DIAGNOSIS — Z823 Family history of stroke: Secondary | ICD-10-CM | POA: Diagnosis not present

## 2015-07-25 DIAGNOSIS — D649 Anemia, unspecified: Secondary | ICD-10-CM | POA: Diagnosis present

## 2015-07-25 DIAGNOSIS — Z8262 Family history of osteoporosis: Secondary | ICD-10-CM | POA: Diagnosis not present

## 2015-07-25 DIAGNOSIS — E860 Dehydration: Secondary | ICD-10-CM | POA: Diagnosis present

## 2015-07-25 DIAGNOSIS — J90 Pleural effusion, not elsewhere classified: Secondary | ICD-10-CM | POA: Diagnosis not present

## 2015-07-25 DIAGNOSIS — J810 Acute pulmonary edema: Secondary | ICD-10-CM | POA: Diagnosis not present

## 2015-07-25 DIAGNOSIS — Z87891 Personal history of nicotine dependence: Secondary | ICD-10-CM | POA: Diagnosis not present

## 2015-07-25 DIAGNOSIS — Z7901 Long term (current) use of anticoagulants: Secondary | ICD-10-CM | POA: Diagnosis not present

## 2015-07-25 DIAGNOSIS — R0602 Shortness of breath: Secondary | ICD-10-CM | POA: Diagnosis not present

## 2015-07-25 DIAGNOSIS — I495 Sick sinus syndrome: Secondary | ICD-10-CM | POA: Diagnosis present

## 2015-07-25 DIAGNOSIS — I482 Chronic atrial fibrillation: Secondary | ICD-10-CM | POA: Diagnosis present

## 2015-07-25 DIAGNOSIS — D638 Anemia in other chronic diseases classified elsewhere: Secondary | ICD-10-CM | POA: Diagnosis not present

## 2015-07-25 DIAGNOSIS — I5031 Acute diastolic (congestive) heart failure: Secondary | ICD-10-CM | POA: Diagnosis not present

## 2015-07-25 DIAGNOSIS — I110 Hypertensive heart disease with heart failure: Secondary | ICD-10-CM | POA: Diagnosis present

## 2015-07-25 DIAGNOSIS — E440 Moderate protein-calorie malnutrition: Secondary | ICD-10-CM | POA: Diagnosis not present

## 2015-07-25 DIAGNOSIS — G252 Other specified forms of tremor: Secondary | ICD-10-CM | POA: Diagnosis present

## 2015-07-25 DIAGNOSIS — E871 Hypo-osmolality and hyponatremia: Secondary | ICD-10-CM | POA: Diagnosis not present

## 2015-07-25 DIAGNOSIS — I5021 Acute systolic (congestive) heart failure: Secondary | ICD-10-CM | POA: Diagnosis not present

## 2015-07-25 DIAGNOSIS — I509 Heart failure, unspecified: Secondary | ICD-10-CM

## 2015-07-25 DIAGNOSIS — R918 Other nonspecific abnormal finding of lung field: Secondary | ICD-10-CM | POA: Diagnosis not present

## 2015-07-25 DIAGNOSIS — E870 Hyperosmolality and hypernatremia: Secondary | ICD-10-CM | POA: Diagnosis present

## 2015-07-25 DIAGNOSIS — Z6820 Body mass index (BMI) 20.0-20.9, adult: Secondary | ICD-10-CM | POA: Diagnosis not present

## 2015-07-25 DIAGNOSIS — F0390 Unspecified dementia without behavioral disturbance: Secondary | ICD-10-CM | POA: Diagnosis present

## 2015-07-25 DIAGNOSIS — E785 Hyperlipidemia, unspecified: Secondary | ICD-10-CM

## 2015-07-25 DIAGNOSIS — J9601 Acute respiratory failure with hypoxia: Secondary | ICD-10-CM | POA: Diagnosis not present

## 2015-07-25 DIAGNOSIS — G250 Essential tremor: Secondary | ICD-10-CM | POA: Diagnosis present

## 2015-07-25 DIAGNOSIS — Z887 Allergy status to serum and vaccine status: Secondary | ICD-10-CM | POA: Diagnosis not present

## 2015-07-25 DIAGNOSIS — Z8701 Personal history of pneumonia (recurrent): Secondary | ICD-10-CM | POA: Diagnosis not present

## 2015-07-25 DIAGNOSIS — J181 Lobar pneumonia, unspecified organism: Secondary | ICD-10-CM | POA: Diagnosis present

## 2015-07-25 DIAGNOSIS — Y95 Nosocomial condition: Secondary | ICD-10-CM | POA: Diagnosis present

## 2015-07-25 DIAGNOSIS — J189 Pneumonia, unspecified organism: Secondary | ICD-10-CM | POA: Diagnosis not present

## 2015-07-25 DIAGNOSIS — I10 Essential (primary) hypertension: Secondary | ICD-10-CM | POA: Diagnosis not present

## 2015-07-25 DIAGNOSIS — Z825 Family history of asthma and other chronic lower respiratory diseases: Secondary | ICD-10-CM | POA: Diagnosis not present

## 2015-07-25 DIAGNOSIS — Z66 Do not resuscitate: Secondary | ICD-10-CM | POA: Diagnosis present

## 2015-07-25 DIAGNOSIS — Z8673 Personal history of transient ischemic attack (TIA), and cerebral infarction without residual deficits: Secondary | ICD-10-CM | POA: Diagnosis not present

## 2015-07-25 DIAGNOSIS — R0902 Hypoxemia: Secondary | ICD-10-CM | POA: Diagnosis not present

## 2015-07-25 DIAGNOSIS — Z90710 Acquired absence of both cervix and uterus: Secondary | ICD-10-CM | POA: Diagnosis not present

## 2015-07-25 DIAGNOSIS — Z79899 Other long term (current) drug therapy: Secondary | ICD-10-CM | POA: Diagnosis not present

## 2015-07-25 DIAGNOSIS — F419 Anxiety disorder, unspecified: Secondary | ICD-10-CM | POA: Diagnosis present

## 2015-07-25 LAB — COMPREHENSIVE METABOLIC PANEL
ALK PHOS: 63 U/L (ref 38–126)
ALT: 18 U/L (ref 14–54)
ANION GAP: 9 (ref 5–15)
AST: 24 U/L (ref 15–41)
Albumin: 2.5 g/dL — ABNORMAL LOW (ref 3.5–5.0)
BILIRUBIN TOTAL: 0.5 mg/dL (ref 0.3–1.2)
BUN: 9 mg/dL (ref 6–20)
CALCIUM: 8 mg/dL — AB (ref 8.9–10.3)
CO2: 28 mmol/L (ref 22–32)
CREATININE: 0.59 mg/dL (ref 0.44–1.00)
Chloride: 94 mmol/L — ABNORMAL LOW (ref 101–111)
GFR calc non Af Amer: >60 mL/min (ref >60)
Glucose, Bld: 95 mg/dL (ref 65–99)
Potassium: 3 mmol/L — ABNORMAL LOW (ref 3.5–5.1)
Sodium: 131 mmol/L — ABNORMAL LOW (ref 135–145)
TOTAL PROTEIN: 5.2 g/dL — AB (ref 6.5–8.1)

## 2015-07-25 LAB — URINE CULTURE: Culture: 1000 — AB

## 2015-07-25 LAB — CBC
HCT: 29.5 % — ABNORMAL LOW (ref 36.0–46.0)
HEMOGLOBIN: 9.8 g/dL — AB (ref 12.0–15.0)
MCH: 31.5 pg (ref 26.0–34.0)
MCHC: 33.2 g/dL (ref 30.0–36.0)
MCV: 94.9 fL (ref 78.0–100.0)
PLATELETS: 381 10*3/uL (ref 150–400)
RBC: 3.11 MIL/uL — AB (ref 3.87–5.11)
RDW: 13.8 % (ref 11.5–15.5)
WBC: 4.7 10*3/uL (ref 4.0–10.5)

## 2015-07-25 LAB — HIV ANTIBODY (ROUTINE TESTING W REFLEX): HIV SCREEN 4TH GENERATION: NONREACTIVE

## 2015-07-25 MED ORDER — PRIMIDONE 250 MG PO TABS
125.0000 mg | ORAL_TABLET | Freq: Two times a day (BID) | ORAL | Status: DC
  Administered 2015-07-25 – 2015-07-30 (×10): 125 mg via ORAL
  Filled 2015-07-25 (×12): qty 1

## 2015-07-25 MED ORDER — LORATADINE 10 MG PO TABS
10.0000 mg | ORAL_TABLET | Freq: Every day | ORAL | Status: DC
  Administered 2015-07-26 – 2015-07-30 (×5): 10 mg via ORAL
  Filled 2015-07-25 (×5): qty 1

## 2015-07-25 MED ORDER — ALBUTEROL SULFATE (2.5 MG/3ML) 0.083% IN NEBU: 3.0000 mL | INHALATION_SOLUTION | Freq: Four times a day (QID) | RESPIRATORY_TRACT | Status: DC | PRN

## 2015-07-25 MED ORDER — RIVAROXABAN 20 MG PO TABS
20.0000 mg | ORAL_TABLET | Freq: Every day | ORAL | Status: DC
  Administered 2015-07-25 – 2015-07-29 (×5): 20 mg via ORAL
  Filled 2015-07-25 (×5): qty 1

## 2015-07-25 MED ORDER — ADULT MULTIVITAMIN W/MINERALS CH
1.0000 | ORAL_TABLET | Freq: Every day | ORAL | Status: DC
  Administered 2015-07-25 – 2015-07-30 (×6): 1 via ORAL
  Filled 2015-07-25 (×5): qty 1

## 2015-07-25 MED ORDER — ALBUTEROL SULFATE HFA 108 (90 BASE) MCG/ACT IN AERS: 2.0000 | INHALATION_SPRAY | Freq: Every day | RESPIRATORY_TRACT | Status: DC | PRN

## 2015-07-25 MED ORDER — ZOLPIDEM TARTRATE 5 MG PO TABS
5.0000 mg | ORAL_TABLET | Freq: Every day | ORAL | Status: DC
  Administered 2015-07-25 – 2015-07-29 (×4): 5 mg via ORAL
  Filled 2015-07-25 (×4): qty 1

## 2015-07-25 MED ORDER — MULTIVITAMINS PO CAPS: 1.0000 | ORAL_CAPSULE | Freq: Every day | ORAL | Status: DC

## 2015-07-25 MED ORDER — CALCIUM CARBONATE-VITAMIN D 500-200 MG-UNIT PO TABS
2.0000 | ORAL_TABLET | Freq: Every day | ORAL | Status: DC
  Administered 2015-07-26 – 2015-07-30 (×5): 2 via ORAL
  Filled 2015-07-25 (×3): qty 2
  Filled 2015-07-25: qty 1
  Filled 2015-07-25 (×2): qty 2

## 2015-07-25 MED ORDER — B COMPLEX-C PO TABS
1.0000 | ORAL_TABLET | Freq: Every day | ORAL | Status: DC
  Administered 2015-07-25 – 2015-07-30 (×6): 1 via ORAL
  Filled 2015-07-25 (×6): qty 1

## 2015-07-25 MED ORDER — CALCIUM + D3 600-200 MG-UNIT PO TABS: 1.0000 | ORAL_TABLET | Freq: Two times a day (BID) | ORAL | Status: DC

## 2015-07-25 MED ORDER — EQL FLAX SEED OIL 1000 MG PO CAPS: 1000.0000 mg | ORAL_CAPSULE | Freq: Every day | ORAL | Status: DC

## 2015-07-25 MED ORDER — VITAMIN B COMPLEX PO TABS: ORAL_TABLET | Freq: Every day | ORAL | Status: DC

## 2015-07-25 MED ORDER — DIPHENHYDRAMINE HCL (SLEEP) 25 MG PO TABS: 50.0000 mg | ORAL_TABLET | Freq: Every day | ORAL | Status: DC

## 2015-07-25 MED ORDER — FERROUS SULFATE 325 (65 FE) MG PO TABS
325.0000 mg | ORAL_TABLET | Freq: Every day | ORAL | Status: DC
  Administered 2015-07-26 – 2015-07-30 (×5): 325 mg via ORAL
  Filled 2015-07-25 (×5): qty 1

## 2015-07-25 MED ORDER — CALCIUM CARBONATE-VITAMIN D 500-200 MG-UNIT PO TABS
1.0000 | ORAL_TABLET | Freq: Every day | ORAL | Status: DC
  Administered 2015-07-25 – 2015-07-29 (×5): 1 via ORAL
  Filled 2015-07-25 (×5): qty 1

## 2015-07-25 MED ORDER — ENSURE ENLIVE PO LIQD
237.0000 mL | Freq: Three times a day (TID) | ORAL | Status: DC
  Administered 2015-07-25 – 2015-07-30 (×16): 237 mL via ORAL

## 2015-07-25 MED ORDER — MELATONIN 10 MG PO TABS: 10.0000 mg | ORAL_TABLET | Freq: Every day | ORAL | Status: DC

## 2015-07-25 NOTE — Progress Notes (Signed)
HOSPITALIST [facility] Hospital Day:   Patient Name: Tahjanae Munoz Admission Date/Time: 07/24/2015  2:09 PM  Age/Sex:82 y.o.female MRN#: [id number]  DOB: 05-Apr-1932 Attending Provider: Marja Kays, MD    Consulting Physicians:  PCP/Outpatient Specialists: Patient Care Team: Fanny Bien, MD as PCP - General (Family Medicine)     Brief Narrative:  82 y/o CM with cognitive deficits s/p h/o CVA, afib, HTN, presents for worsening SOB. Patient recently treated 2 weeks ago in [place] for Pneumonia. Completed with cefdinir. Noted 20 lb weight gain and LE edema. CT chest concerning for multifocal PNA with superimposed interstitial edema, effusions R>L, and a questionable right apical mass. Curbsided pulmonlogy and not concerned for mass, likely all CHF. Needs reimaging in 4-6 weeks to reassess.    Assessment:   Active Hospital Problems   Diagnosis Date Noted  . Acute CHF (congestive heart failure) ([place]) 07/25/2015  . Hyponatremia 07/25/2015  . Anemia of chronic disease 07/25/2015  . Essential hypertension 07/22/2014  . Permanent atrial fibrillation ([place]) 07/07/2014  . Hyperlipidemia 07/07/2014    Resolved Hospital Problems   Diagnosis Date Noted Date Resolved  . HCAP (healthcare-associated pneumonia) 07/24/2015 07/25/2015  . Acute respiratory failure with hypoxia ([place]) 07/24/2015 07/25/2015  . Lung mass 07/24/2015 07/25/2015     Plan:  - Discussed with family and Pulmonologist on call, Dr. Larkin Ina. Symptoms and findings look most consistent with CHF. Can re-image in 4-6 weeks to monitor edema and effusions. If still concern for a mass then can pursue a biopsy if desired as outpatient.  - I had held her xarelto in case there was going to be any intervention, however I will now resume; she is on this for afib. - I do agree that this is likely NEW ONSET CHF, UNKNOWN TYPE, and actually not PNA, she has absolutely no SIRS and her procalcitonin is normal. DC Antibiotics. She is  already now undergoing more than 2 weeks worth of abx. Pneumococcal antigen pending, blood cultures negative - she should undergo further diuresis, continue with IV lasix. Follow up on 2D ECHO; strict I/Os, low salt diet, fluid restrict, daily weight.  - BP and HR are otherwise controlled, continue coreg and diltiazem. - sodium improved with diuresis, likely related to volume overload - work up anemia  I examined the patient and reviewed the chart, labs and data. I discussed the patient's status and plan of care with Patient, Family, Patient's treatment team.   DVT prophylaxis:  already on xarelto  Code Status: DNR Family Communication: Daughter and son-in-law    Disposition Plan:  2-3 days pending clinical response, change to inpatient status   Consultants:   curbsided pulmonology, Dr. Larkin Ina,  not formally consulted  Procedures:   none  Antimicrobials:  Vanco and Cefepime started on admission, dc'd 4/27.    Subjective:  Patient is feeling well, no complaints. No CP or SOB. Although daughter states that you have to be very specific with her as she won't admit to any pain. LE edema persists.    Objective:  Temp:  [98.1 F (36.7 C)-98.6 F (37 C)] 98.5 F (36.9 C) (04/27 0512) Pulse Rate:  [81-105] 100 (04/27 0512) Resp:  [16-27] 16 (04/27 0512) BP: (117-153)/(44-87) 130/55 mmHg (04/27 0512) SpO2:  [71 %-97 %] 94 % (04/27 0512) Weight:  [61.553 kg (135 lb 11.2 oz)] 61.553 kg (135 lb 11.2 oz) (04/27 0358) SpO2 Readings from Last 3 Encounters:  07/25/15 94%  07/09/14 95%    Intake/Output Summary (Last 24 hours)  at 07/25/15 1409 Last data filed at 07/25/15 1100  Gross per 24 hour  Intake    680 ml  Output   3900 ml  Net  -3220 ml   Filed Weights   07/24/15 1349 07/25/15 0358  Weight: 66.282 kg (146 lb 2 oz) 61.553 kg (135 lb 11.2 oz)   Body mass index is 21.25 kg/(m^2).   Physical Exam  Constitutional: She is well-developed, well-nourished, and in no distress. No  distress.  Cardiovascular: Normal rate, regular rhythm and normal heart sounds.   Pulmonary/Chest: Effort normal and breath sounds normal. No respiratory distress. She has no wheezes. She has no rales.  Abdominal: Soft. Bowel sounds are normal. She exhibits no distension. There is no tenderness.  Musculoskeletal: She exhibits edema (3+ LEs). She exhibits no tenderness.  Neurological: She is alert. GCS score is 15.  Skin: Skin is warm and dry. She is not diaphoretic. No erythema.     Medications:  Scheduled Meds: . atorvastatin  20 mg Oral q1800  . carvedilol  12.5 mg Oral BID WC  . ceFEPime (MAXIPIME) IV  1 g Intravenous Q8H  . diltiazem  360 mg Oral Daily  . feeding supplement (ENSURE ENLIVE)  237 mL Oral TID BM  . FLUoxetine  20 mg Oral Daily  . furosemide  20 mg Intravenous BID  . memantine  10 mg Oral BID  . potassium chloride  40 mEq Oral Once  . sodium chloride flush  3 mL Intravenous Q12H  . sodium chloride flush  3 mL Intravenous Q12H  . vancomycin  500 mg Intravenous Q12H   Continuous Infusions:  PRN Meds:  sodium chloride 250 mL PRN  sodium chloride 250 mL PRN  acetaminophen 650 mg Q6H PRN  Or    acetaminophen 650 mg Q6H PRN  ALPRAZolam 0.25 mg TID PRN  bisacodyl 10 mg Daily PRN  diltiazem 10 mg Once PRN  HYDROcodone-acetaminophen 1-2 tablet Q4H PRN  magnesium citrate 1 Bottle Once PRN  ondansetron 4 mg Q6H PRN  Or    ondansetron (ZOFRAN) IV 4 mg Q6H PRN  polyvinyl alcohol 1 drop PRN  senna-docusate 1 tablet QHS PRN  sodium chloride flush 3 mL PRN  sodium chloride flush 3 mL PRN  traZODone 25 mg QHS PRN     Labs:  CBC:  Recent Labs Lab 07/24/15 1330 07/25/15 0505  WBC 6.5 4.7  HGB 10.8* 9.8*  HCT 32.0* 29.5*  MCV 94.1 94.9  PLT 408* [id number]    Basic Metabolic Panel:  Recent Labs Lab 07/24/15 1330 07/25/15 0505  GLUCOSE 138* 95  NA 127* 131*  K 3.4* 3.0*  CL 91* 94*  CO2 26 28  BUN 7 9  CREATININE 0.51 0.59  CALCIUM 8.7* 8.0*     GFR: CrCl cannot be calculated (Unknown ideal weight.).  Liver Function Tests:  Recent Labs Lab 07/25/15 0505  AST 24  ALT 18  ALKPHOS 63  BILITOT 0.5  PROT 5.2*  ALBUMIN 2.5*    BNP (last 3 results)  Recent Labs  07/24/15 1830  BNP 343.9*    Latest HbA1C/Lipids/Thyroid/Anemia: Lab Results  Component Value Date   TSH 0.937 07/24/2015    Urinalysis    Component Value Date/Time   COLORURINE YELLOW 07/24/2015 1345   APPEARANCEUR CLOUDY* 07/24/2015 1345   LABSPEC 1.036* 07/24/2015 1345   PHURINE 7.5 07/24/2015 1345   GLUCOSEU NEGATIVE 07/24/2015 1345   HGBUR SMALL* 07/24/2015 1345   BILIRUBINUR NEGATIVE 07/24/2015 1345   KETONESUR 15* 07/24/2015  Laura 07/24/2015 1345   UROBILINOGEN 0.2 07/07/2014 1451   NITRITE NEGATIVE 07/24/2015 1345   LEUKOCYTESUR NEGATIVE 07/24/2015 1345     Recent Results (from the past 240 hour(s))  Culture, blood (routine x 2) Call MD if unable to obtain prior to antibiotics being given     Status: None (Preliminary result)   Collection Time: 07/24/15  5:40 PM  Result Value Ref Range Status   Specimen Description BLOOD LEFT ANTECUBITAL  Final   Special Requests BOTTLES DRAWN AEROBIC AND ANAEROBIC 5CC  Final   Culture NO GROWTH < 24 HOURS  Final   Report Status PENDING  Incomplete  Culture, blood (routine x 2) Call MD if unable to obtain prior to antibiotics being given     Status: None (Preliminary result)   Collection Time: 07/24/15  5:45 PM  Result Value Ref Range Status   Specimen Description BLOOD LEFT HAND  Final   Special Requests BOTTLES DRAWN AEROBIC AND ANAEROBIC 5CC  Final   Culture NO GROWTH < 24 HOURS  Final   Report Status PENDING  Incomplete      Radiology Studies: Ct Chest W Contrast  07/24/2015  CLINICAL DATA:  Severe shortness of breath, cough, evaluate for multifocal pneumonia versus mass EXAM: CT CHEST WITH CONTRAST TECHNIQUE: Multidetector CT imaging of the chest was performed during  intravenous contrast administration. CONTRAST:  6mL ISOVUE-300 IOPAMIDOL (ISOVUE-300) INJECTION 61% COMPARISON:  Chest radiographs dated 07/23/2015 FINDINGS: Mediastinum/Nodes: The heart is top-normal in size. No pericardial effusion. Coronary atherosclerosis. Atherosclerotic calcifications of the aortic arch. Small mediastinal lymph nodes, including a 12 mm short axis low right paratracheal node (series 3/ image 51), favored to be reactive. Visualized thyroid is unremarkable. Lungs/Pleura: Evaluation of the lung parenchyma is constrained by respiratory motion. Multifocal patchy/ground-glass opacities, with associated interlobular septal thickening in the bilateral upper lobes. This appearance favors multifocal infection with superimposed interstitial edema. Associated moderate right and small left pleural effusions. Mild compressive atelectasis in the bilateral lower lobes. Suspected biapical pleural-parenchymal scarring, right greater than left. However, an underlying right apical mass is difficult to entirely exclude given the surrounding apical fluid obscuring this region (series 4/ image 22). No pneumothorax. Upper abdomen: Visualized upper abdomen is notable for vascular calcifications. Musculoskeletal: Mild to moderate superior endplate compression fracture deformity at L1, unchanged. IMPRESSION: Suspected multifocal pneumonia with superimposed interstitial edema. Moderate right and small left pleural effusions. Suspected biapical pleural-parenchymal scarring, right greater than left. Underlying right apical mass is difficult to entirely exclude given the surrounding fluid on the current study. Consider follow-up CT chest in 3 months after resolution of acute illness. Electronically Signed   By: Julian Hy M.D.   On: 07/24/2015 10:32     Time spent: 35 minutes   Marja Kays, MD [facility]  If 7PM-7AM, please contact night-coverage [email or web address] Password [email or web address] 07/25/2015, 2:09  PM

## 2015-07-25 NOTE — Evaluation (Signed)
Occupational Therapy Evaluation Patient Details Name: Megan Munoz MRN: [id number] DOB: [birth year] Today's Date: 07/25/2015    History of Present Illness Pt is a 82 year old WF admitted with acute respiratory failure with hypoxia secondary to CHF exacerbation with HCAP.  Pt was in hospital in [place] 2 weeks ago with UTI and has gained 20 pounds since d/c there.  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   Clinical Impression   Pt admitted with the above diagnosis and has the deficits outlined below. Pt would benefit from cont OT to increase independence and efficiency with all adls so she can safely d/c home with her daughter.  Pt would benefit from energy conservation education.  If 24 hr S not available, may need to consider SNF but feel with her history of cognitive deficits she will recover more quickly at home.    Follow Up Recommendations  Home health OT;Supervision/Assistance - 24 hour    Equipment Recommendations  None recommended by OT    Recommendations for Other Services PT consult     Precautions / Restrictions Precautions Precautions: Fall Restrictions Weight Bearing Restrictions: No      Mobility Bed Mobility Overal bed mobility: Needs Assistance Bed Mobility: Supine to Sit     Supine to sit: Min assist     General bed mobility comments: Pt required assist to square hips up on side of bed.  Given extra time, pt is more independent.  Transfers Overall transfer level: Needs assistance Equipment used: 1 person hand held assist Transfers: Stand Pivot Transfers;Sit to/from Stand Sit to Stand: Min guard Stand pivot transfers: Min assist       General transfer comment: pt requires cues for hand placement. Pt tends to begin sitting before she has squared up to the chair.    Balance Overall balance assessment: Needs assistance Sitting-balance support: Feet supported Sitting balance-Leahy Scale: Good     Standing balance support: Bilateral upper  extremity supported;During functional activity Standing balance-Leahy Scale: Poor Standing balance comment: Pt required outside assist to remain standing.  Pt very SOB on her feet.                            ADL Overall ADL's : Needs assistance/impaired Eating/Feeding: Set up;Sitting   Grooming: Wash/dry face;Wash/dry hands;Oral care;Set up;Sitting   Upper Body Bathing: Set up;Sitting   Lower Body Bathing: Moderate assistance;Sit to/from stand Lower Body Bathing Details (indicate cue type and reason): breaks due to SOB and assist reaching backside and while standing. Upper Body Dressing : Set up;Sitting   Lower Body Dressing: Moderate assistance;Sit to/from stand;Cueing for safety Lower Body Dressing Details (indicate cue type and reason): assist given for socks and shoes and to stand and manage pants.  Pt got SOB even with very little activity.  Toilet Transfer: Minimal assistance;BSC;Stand-pivot   Toileting- Clothing Manipulation and Hygiene: Supervision/safety;Sitting/lateral lean       Functional mobility during ADLs: Minimal assistance General ADL Comments: Pt did well with adls in general, but just got so SOB with any amount of activity.  Began talking about energy conservation.  Pt will need further EC education before d/c     Vision Vision Assessment?: No apparent visual deficits   Perception     Praxis      Pertinent Vitals/Pain Pain Assessment: No/denies pain     Hand Dominance Right (often time uses left due to tremor in R hand.)   Extremity/Trunk Assessment Upper Extremity Assessment  Upper Extremity Assessment: Generalized weakness   Lower Extremity Assessment Lower Extremity Assessment: Defer to PT evaluation   Cervical / Trunk Assessment Cervical / Trunk Assessment: Normal   Communication Communication Communication: No difficulties   Cognition Arousal/Alertness: Awake/alert Behavior During Therapy: WFL for tasks  assessed/performed Overall Cognitive Status: History of cognitive impairments - at baseline       Memory: Decreased short-term memory             General Comments       Exercises       Shoulder Instructions      Home Living Family/patient expects to be discharged to:: Private residence Living Arrangements: Children Available Help at Discharge: Family;Other (Comment) (unsure availability) Type of Home: House Home Access: Stairs to enter [employer] of Steps: 1 Entrance Stairs-Rails: None Home Layout: Two level Alternate Level Stairs-Number of Steps: 17 Alternate Level Stairs-Rails: Right Bathroom Shower/Tub: Walk-in shower;Door   [employer] Toilet: Standard Bathroom Accessibility: Yes   Home Equipment: Toilet riser;Shower seat;Cane - single point          Prior Functioning/Environment Level of Independence: Independent with assistive device(s)        Comments: Pt lives with daughter who has 5 children.  Bedroom is on 2nd floor.  Daughter wants to move pt downstairs but pt really likes her room upstairs; it gives her some privacy so she has personal goals to stay upstairs and manage the steps.    OT Diagnosis: Generalized weakness;Cognitive deficits   OT Problem List: Decreased strength;Decreased activity tolerance;Impaired balance (sitting and/or standing);Decreased coordination;Decreased cognition;Decreased safety awareness;Decreased knowledge of use of DME or AE;Increased edema   OT Treatment/Interventions: Self-care/ADL training;DME and/or AE instruction;Energy conservation;Therapeutic activities    OT Goals(Current goals can be found in the care plan section) Acute Rehab OT Goals Patient Stated Goal: to hopefully go home over the weekend. OT Goal Formulation: With patient Time For Goal Achievement: 08/01/15 Potential to Achieve Goals: Good ADL Goals Pt Will Perform Grooming: with supervision;standing Pt Will Perform Lower Body Bathing: with  supervision;sit to/from stand Pt Will Perform Lower Body Dressing: with supervision;sit to/from stand Pt Will Perform Tub/Shower Transfer: Shower transfer;shower seat;with supervision;ambulating Additional ADL Goal #1: Pt will walk to bathroom and toilet on raised commode with S. Additional ADL Goal #2: Pt will state 3 things she can do to conserve energy at home during adls.  OT Frequency: Min 2X/week   Barriers to D/C:    unsure if someone is with pt 24/7.       Co-evaluation              End of Session Equipment Utilized During Treatment: Oxygen Nurse Communication: Mobility status  Activity Tolerance: Patient limited by fatigue Patient left: in chair;with call bell/phone within reach;with chair alarm set   Time: [phone] OT Time Calculation (min): 20 min Charges:  OT General Charges $OT Visit: 1 Procedure OT Evaluation $OT Eval Moderate Complexity: 1 Procedure G-Codes: OT G-codes **NOT FOR INPATIENT CLASS** Functional Assessment Tool Used: clinical judgement Functional Limitation: Self care Self Care Current Status [id number]): At least 20 percent but less than 40 percent impaired, limited or restricted Self Care Goal Status [id number]): At least 1 percent but less than 20 percent impaired, limited or restricted  Glenford Peers 07/25/2015, 9:33 AM  [phone]

## 2015-07-25 NOTE — Care Management Obs Status (Signed)
MEDICARE OBSERVATION STATUS NOTIFICATION   Patient Details  Name: Megan Munoz MRN: [id number] Date of Birth: March 14, 1933   Medicare Observation Status Notification Given:  Yes    Sharin Mons, RN 07/25/2015, 12:50 PM

## 2015-07-25 NOTE — Progress Notes (Signed)
PHARMACIST - PHYSICIAN ORDER COMMUNICATION  CONCERNING: P&T Medication Policy on Herbal Medications  DESCRIPTION:  This patient's order for:  Flaxseed oil and melatonin  has been noted.  This product(s) is classified as an "herbal" or natural product. Due to a lack of definitive safety studies or FDA approval, nonstandard manufacturing practices, plus the potential risk of unknown drug-drug interactions while on inpatient medications, the Pharmacy and Therapeutics Committee does not permit the use of "herbal" or natural products of this type within [facility].   ACTION TAKEN: The pharmacy department is unable to verify this order at this time and your patient has been informed of this safety policy. Please reevaluate patient's clinical condition at discharge and address if the herbal or natural product(s) should be resumed at that time.  Eudelia Bunch, Pharm.D. [id number] 07/25/2015 2:55 PM

## 2015-07-26 ENCOUNTER — Inpatient Hospital Stay (HOSPITAL_COMMUNITY): Payer: Medicare Other

## 2015-07-26 DIAGNOSIS — I509 Heart failure, unspecified: Secondary | ICD-10-CM

## 2015-07-26 DIAGNOSIS — I10 Essential (primary) hypertension: Secondary | ICD-10-CM

## 2015-07-26 DIAGNOSIS — I5021 Acute systolic (congestive) heart failure: Secondary | ICD-10-CM

## 2015-07-26 DIAGNOSIS — J9601 Acute respiratory failure with hypoxia: Secondary | ICD-10-CM

## 2015-07-26 DIAGNOSIS — D638 Anemia in other chronic diseases classified elsewhere: Secondary | ICD-10-CM

## 2015-07-26 LAB — BASIC METABOLIC PANEL
ANION GAP: 8 (ref 5–15)
BUN: 13 mg/dL (ref 6–20)
CHLORIDE: 96 mmol/L — AB (ref 101–111)
CO2: 31 mmol/L (ref 22–32)
Calcium: 8.7 mg/dL — ABNORMAL LOW (ref 8.9–10.3)
Creatinine, Ser: 0.55 mg/dL (ref 0.44–1.00)
GFR calc Af Amer: >60 mL/min (ref >60)
GLUCOSE: 104 mg/dL — AB (ref 65–99)
POTASSIUM: 3.9 mmol/L (ref 3.5–5.1)
Sodium: 135 mmol/L (ref 135–145)

## 2015-07-26 LAB — CBC WITH DIFFERENTIAL/PLATELET
BASOS PCT: 1 %
Basophils Absolute: 0 10*3/uL (ref 0.0–0.1)
Eosinophils Absolute: 0.2 10*3/uL (ref 0.0–0.7)
Eosinophils Relative: 4 %
HEMATOCRIT: 29 % — AB (ref 36.0–46.0)
HEMOGLOBIN: 9.5 g/dL — AB (ref 12.0–15.0)
LYMPHS ABS: 1.4 10*3/uL (ref 0.7–4.0)
LYMPHS PCT: 27 %
MCH: 31.8 pg (ref 26.0–34.0)
MCHC: 32.8 g/dL (ref 30.0–36.0)
MCV: 97 fL (ref 78.0–100.0)
MONOS PCT: 20 %
Monocytes Absolute: 1 10*3/uL (ref 0.1–1.0)
NEUTROS ABS: 2.4 10*3/uL (ref 1.7–7.7)
NEUTROS PCT: 49 %
PLATELETS: 364 10*3/uL (ref 150–400)
RBC: 2.99 MIL/uL — ABNORMAL LOW (ref 3.87–5.11)
RDW: 13.9 % (ref 11.5–15.5)
WBC: 5 10*3/uL (ref 4.0–10.5)

## 2015-07-26 LAB — IRON AND TIBC
Iron: 16 ug/dL — ABNORMAL LOW (ref 28–170)
SATURATION RATIOS: 8 % — AB (ref 10.4–31.8)
TIBC: 204 ug/dL — ABNORMAL LOW (ref 250–450)
UIBC: 188 ug/dL

## 2015-07-26 LAB — PHOSPHORUS: PHOSPHORUS: 2.9 mg/dL (ref 2.5–4.6)

## 2015-07-26 LAB — BRAIN NATRIURETIC PEPTIDE: B NATRIURETIC PEPTIDE 5: 188.2 pg/mL — AB (ref 0.0–100.0)

## 2015-07-26 LAB — ECHOCARDIOGRAM COMPLETE: Weight: 2162.27 oz

## 2015-07-26 LAB — FOLATE: Folate: 16.1 ng/mL (ref >5.9)

## 2015-07-26 LAB — VITAMIN B12: VITAMIN B 12: 1110 pg/mL — AB (ref 180–914)

## 2015-07-26 LAB — MAGNESIUM: MAGNESIUM: 1.7 mg/dL (ref 1.7–2.4)

## 2015-07-26 LAB — PROCALCITONIN: Procalcitonin: <0.1 ng/mL

## 2015-07-26 LAB — FERRITIN: Ferritin: 311 ng/mL — ABNORMAL HIGH (ref 11–307)

## 2015-07-26 MED ORDER — FUROSEMIDE 10 MG/ML IJ SOLN
40.0000 mg | Freq: Two times a day (BID) | INTRAMUSCULAR | Status: DC
  Administered 2015-07-26 – 2015-07-27 (×2): 40 mg via INTRAVENOUS
  Filled 2015-07-26 (×4): qty 4

## 2015-07-26 MED ORDER — LEVOFLOXACIN IN D5W 750 MG/150ML IV SOLN
750.0000 mg | INTRAVENOUS | Status: DC
  Administered 2015-07-26 – 2015-07-28 (×2): 750 mg via INTRAVENOUS
  Filled 2015-07-26 (×2): qty 150

## 2015-07-26 MED ORDER — CARVEDILOL 6.25 MG PO TABS
6.2500 mg | ORAL_TABLET | Freq: Two times a day (BID) | ORAL | Status: DC
  Administered 2015-07-27 – 2015-07-30 (×8): 6.25 mg via ORAL
  Filled 2015-07-26 (×8): qty 1

## 2015-07-26 MED ORDER — DILTIAZEM HCL ER COATED BEADS 180 MG PO CP24
180.0000 mg | ORAL_CAPSULE | Freq: Every day | ORAL | Status: DC
  Administered 2015-07-27 – 2015-07-30 (×4): 180 mg via ORAL
  Filled 2015-07-26 (×4): qty 1

## 2015-07-26 MED ORDER — CARVEDILOL 6.25 MG PO TABS
6.2500 mg | ORAL_TABLET | Freq: Two times a day (BID) | ORAL | Status: DC
  Filled 2015-07-26: qty 1

## 2015-07-26 NOTE — Progress Notes (Signed)
PROGRESS NOTE        PATIENT DETAILS Name: Megan Munoz Age: 82 y.o. Sex: female Date of Birth: 1932-10-30 Admit Date: 07/24/2015 Admitting Physician Waldemar Dickens, MD [id number], MD Outpatient Specialists:Dr Ellyn Hack  Brief Narrative: Patient is a 82 y.o. female with past medical history of permanent atrial fibrillation on anticoagulation who presented with shortness of breath-which was felt to be multifactorial from new onset acute decompensated heart failure and possible pneumonia. Please see below for further details.  Subjective: Pleasantly confused-appears slightly dyspneic. Has orthopnea this morning. Has 3+ pitting edema in the lower extremities.  Assessment/Plan: Principal Problem: Acute hypoxic respiratory failure: With weight gain, lower extremity edema-suspect this is more of acute CHF rather than pneumonia. CT scan chest on 4/26 does show bilateral infiltrates-however patient has no signs or symptoms of pneumonia. She apparently did have pneumonia approximately 2 weeks ago-infiltrates on the CT scan could be remnants of her most recent pneumonia. Discussed with Dr. Titus Mould, recommends antibiotics-mostly for atypical coverage, continue diuretics. Follow.   Active Problems: New Onset unspecified acute CHF: Resume Lasix-40 mg IV twice a day. Awaiting echocardiogram to see whether this is systolic or diastolic. She appears more dyspneic and orthopneic today-suspect she is going to require a Foley catheter as she requires aggressive IV diuretics. Weight down to 135 pounds (146 pounds on admission), negative balance of 3.7 L so far.  ? Multifocal PNA: Symptoms most suggestive of congestive heart failure rather than pneumonia. Will empirically start IV levofloxacin-mostly for atypical coverage and follow  Right sided pleural effusion:parapneumonic vs 2/2 CHF. PCCM consulted for thoracocentesis.   Hypervolemic hyponatremia: Resolved with diuretics.  Follow  Atrial fibrillation: Chronic issue, rate controlled with Coreg and Cardizem-on anticoagulation with Xarelto-will hold temporarily for thoracocentesis.  Hypertension: Controlled-AS patient will be on IV Lasix-and BP somewhat soft-will cut down dose of Cardizem to 180, and Coreg 6.25. Follow and adjust accordingly  History of CVA: Nonfocal exam-but pleasantly confused. Continue anticoagulation  Mild cognitive dysfunction/early dementia: Continue Namenda  History of essential tremor: Continue primidone.  Anemia: Suspect secondary to acute illness-confirmed on anemia panel-no evidence of overt blood loss apparent-follow.  ? Right apical lung mass: Has biapical pleural and parenchymal scarring-radiologist unable to rule out underlying right apical mass-recommendations are to repeat CT chest in 3 months after resolution of acute illness.  DVT Prophylaxis: Full dose anticoagulation with Xarelto  Code Status: DNR  Family Communication: None at bedside-tried calling patient's daughter came-response  Disposition Plan: Remain inpatient-but will plan on Home health vs SNF on discharge  Antimicrobial agents: IV Vanco 4/26>>4/27 IV Cefepime 4/26>>4/27  Procedures: Echo-pending  CONSULTS:  pulmonary/intensive care  Time spent: 30 minutes-Greater than 50% of this time was spent in counseling, explanation of diagnosis, planning of further management, and coordination of care.  MEDICATIONS: Anti-infectives    Start     Dose/Rate Route Frequency Ordered Stop   07/25/15 0400  vancomycin (VANCOCIN) 500 mg in sodium chloride 0.9 % 100 mL IVPB  Status:  Discontinued     500 mg 100 mL/hr over 60 Minutes Intravenous Every 12 hours 07/24/15 1533 07/25/15 1436   07/24/15 2200  ceFEPIme (MAXIPIME) 1 g in dextrose 5 % 50 mL IVPB  Status:  Discontinued     1 g 100 mL/hr over 30 Minutes Intravenous Every 8 hours 07/24/15 1654 07/25/15 1436   07/24/15 1515  ceFEPIme (MAXIPIME) 1 g in  dextrose 5 % 50 mL IVPB     1 g 100 mL/hr over 30 Minutes Intravenous  Once 07/24/15 1507 07/24/15 1622   07/24/15 1515  vancomycin (VANCOCIN) 1,250 mg in sodium chloride 0.9 % 250 mL IVPB     1,250 mg 166.7 mL/hr over 90 Minutes Intravenous  Once 07/24/15 1509 07/24/15 1821      Scheduled Meds: . atorvastatin  20 mg Oral q1800  . B-complex with vitamin C  1 tablet Oral Daily  . calcium-vitamin D  2 tablet Oral Q breakfast   And  . calcium-vitamin D  1 tablet Oral QHS  . carvedilol  12.5 mg Oral BID WC  . diltiazem  360 mg Oral Daily  . feeding supplement (ENSURE ENLIVE)  237 mL Oral TID BM  . ferrous sulfate  325 mg Oral Q breakfast  . FLUoxetine  20 mg Oral Daily  . furosemide  40 mg Intravenous BID  . loratadine  10 mg Oral Daily  . memantine  10 mg Oral BID  . multivitamin with minerals  1 tablet Oral Daily  . primidone  125 mg Oral BID  . rivaroxaban  20 mg Oral Q supper  . sodium chloride flush  3 mL Intravenous Q12H  . sodium chloride flush  3 mL Intravenous Q12H  . zolpidem  5 mg Oral QHS   Continuous Infusions:  PRN Meds:.sodium chloride, sodium chloride, acetaminophen **OR** acetaminophen, albuterol, ALPRAZolam, bisacodyl, diltiazem, HYDROcodone-acetaminophen, magnesium citrate, ondansetron **OR** ondansetron (ZOFRAN) IV, polyvinyl alcohol, senna-docusate, sodium chloride flush, sodium chloride flush   PHYSICAL EXAM: Vital signs: Filed Vitals:   07/25/15 1423 07/25/15 2218 07/26/15 0529 07/26/15 0533  BP: 121/49 123/56  120/55  Pulse: 64 99  90  Temp: 97.8 F (36.6 C) 98 F (36.7 C)  98.1 F (36.7 C)  TempSrc:      Resp: 16 18  16   Weight:   61.3 kg (135 lb 2.3 oz)   SpO2: 100% 95%  99%   Filed Weights   07/24/15 1349 07/25/15 0358 07/26/15 0529  Weight: 66.282 kg (146 lb 2 oz) 61.553 kg (135 lb 11.2 oz) 61.3 kg (135 lb 2.3 oz)   Body mass index is 21.16 kg/(m^2).   Gen Exam: Awake andDoesn't do confused. Speech is clear, she appears mildly tachypneic.   Neck: Supple, + JVD.   Chest: Bibasilar rales CVS: S1 S2 is irregular. Abdomen: soft, BS +, non tender, non distended.  Extremities:+++ edema, lower extremities warm to touch. Neurologic: Non Focal.   Skin: No Rash or lesions   Wounds: N/A.   LABORATORY DATA: CBC:  Recent Labs Lab 07/24/15 1330 07/25/15 0505 07/26/15 0634  WBC 6.5 4.7 5.0  NEUTROABS  --   --  2.4  HGB 10.8* 9.8* 9.5*  HCT 32.0* 29.5* 29.0*  MCV 94.1 94.9 97.0  PLT 408* [id number]    Basic Metabolic Panel:  Recent Labs Lab 07/24/15 1330 07/25/15 0505 07/26/15 0634  NA 127* 131* 135  K 3.4* 3.0* 3.9  CL 91* 94* 96*  CO2 26 28 31   GLUCOSE 138* 95 104*  BUN 7 9 13   CREATININE 0.51 0.59 0.55  CALCIUM 8.7* 8.0* 8.7*  MG  --   --  1.7  PHOS  --   --  2.9    GFR: CrCl cannot be calculated (Unknown ideal weight.).  Liver Function Tests:  Recent Labs Lab 07/25/15 0505  AST 24  ALT 18  ALKPHOS 63  BILITOT 0.5  PROT 5.2*  ALBUMIN 2.5*   No results for input(s): LIPASE, AMYLASE in the last 168 hours. No results for input(s): AMMONIA in the last 168 hours.  Coagulation Profile: No results for input(s): INR, PROTIME in the last 168 hours.  Cardiac Enzymes: No results for input(s): CKTOTAL, CKMB, CKMBINDEX, TROPONINI in the last 168 hours.  BNP (last 3 results) No results for input(s): PROBNP in the last 8760 hours.  HbA1C: No results for input(s): HGBA1C in the last 72 hours.  CBG: No results for input(s): GLUCAP in the last 168 hours.  Lipid Profile: No results for input(s): CHOL, HDL, LDLCALC, TRIG, CHOLHDL, LDLDIRECT in the last 72 hours.  Thyroid Function Tests:  Recent Labs  07/24/15 1830  TSH 0.937    Anemia Panel:  Recent Labs  07/26/15 0634  VITAMINB12 1110*  FOLATE 16.1  FERRITIN 311*  TIBC 204*  IRON 16*    Urine analysis:    Component Value Date/Time   COLORURINE YELLOW 07/24/2015 1345   APPEARANCEUR CLOUDY* 07/24/2015 1345   LABSPEC 1.036* 07/24/2015  1345   PHURINE 7.5 07/24/2015 1345   GLUCOSEU NEGATIVE 07/24/2015 1345   HGBUR SMALL* 07/24/2015 1345   BILIRUBINUR NEGATIVE 07/24/2015 1345   KETONESUR 15* 07/24/2015 1345   PROTEINUR NEGATIVE 07/24/2015 1345   UROBILINOGEN 0.2 07/07/2014 1451   NITRITE NEGATIVE 07/24/2015 1345   LEUKOCYTESUR NEGATIVE 07/24/2015 1345    Sepsis Labs: Lactic Acid, Venous    Component Value Date/Time   LATICACIDVEN 1.26 07/24/2015 1629    MICROBIOLOGY: Recent Results (from the past 240 hour(s))  Urine culture     Status: Abnormal   Collection Time: 07/24/15  1:45 PM  Result Value Ref Range Status   Specimen Description URINE, RANDOM  Final   Special Requests NONE  Final   Culture 1,000 COLONIES/mL INSIGNIFICANT GROWTH (A)  Final   Report Status 07/25/2015 FINAL  Final  Culture, blood (routine x 2) Call MD if unable to obtain prior to antibiotics being given     Status: None (Preliminary result)   Collection Time: 07/24/15  5:40 PM  Result Value Ref Range Status   Specimen Description BLOOD LEFT ANTECUBITAL  Final   Special Requests BOTTLES DRAWN AEROBIC AND ANAEROBIC 5CC  Final   Culture NO GROWTH < 24 HOURS  Final   Report Status PENDING  Incomplete  Culture, blood (routine x 2) Call MD if unable to obtain prior to antibiotics being given     Status: None (Preliminary result)   Collection Time: 07/24/15  5:45 PM  Result Value Ref Range Status   Specimen Description BLOOD LEFT HAND  Final   Special Requests BOTTLES DRAWN AEROBIC AND ANAEROBIC 5CC  Final   Culture NO GROWTH < 24 HOURS  Final   Report Status PENDING  Incomplete    RADIOLOGY STUDIES/RESULTS: Dg Chest 2 View  07/23/2015  CLINICAL DATA:  Follow-up pneumonia.  Severe shortness of breath. EXAM: CHEST  2 VIEW COMPARISON:  Chest radiograph 07/07/2014 FINDINGS: Stable cardiac and mediastinal contours. Scattered patchy areas of consolidation throughout the right-greater-than-left lungs. Small bilateral pleural effusions,  right-greater-than-left. Interval progression of right-greater-than-left biapical pleural parenchymal thickening. Additionally there is pleural thickening along the left upper lateral hemi thorax. Regional skeleton is unremarkable. IMPRESSION: Right-greater-than-left patchy consolidative opacities concerning for multi focal pneumonia in the appropriate clinical setting. Right-greater-than-left small bilateral pleural effusions. Interval worsening right-greater-than-left biapical pleural parenchymal thickening which is nonspecific and may be secondary to scarring or infection. Malignancy is not  excluded. Given the multitude of findings, many of which are progressed or worsened from prior chest radiograph, short-term followup radiograph in 4- 6 weeks is recommended. If these findings persist, contrast-enhanced chest CT is recommended. Electronically Signed   By: Lovey Newcomer M.D.   On: 07/23/2015 13:46   Ct Chest W Contrast  07/24/2015  CLINICAL DATA:  Severe shortness of breath, cough, evaluate for multifocal pneumonia versus mass EXAM: CT CHEST WITH CONTRAST TECHNIQUE: Multidetector CT imaging of the chest was performed during intravenous contrast administration. CONTRAST:  102mL ISOVUE-300 IOPAMIDOL (ISOVUE-300) INJECTION 61% COMPARISON:  Chest radiographs dated 07/23/2015 FINDINGS: Mediastinum/Nodes: The heart is top-normal in size. No pericardial effusion. Coronary atherosclerosis. Atherosclerotic calcifications of the aortic arch. Small mediastinal lymph nodes, including a 12 mm short axis low right paratracheal node (series 3/ image 51), favored to be reactive. Visualized thyroid is unremarkable. Lungs/Pleura: Evaluation of the lung parenchyma is constrained by respiratory motion. Multifocal patchy/ground-glass opacities, with associated interlobular septal thickening in the bilateral upper lobes. This appearance favors multifocal infection with superimposed interstitial edema. Associated moderate right and  small left pleural effusions. Mild compressive atelectasis in the bilateral lower lobes. Suspected biapical pleural-parenchymal scarring, right greater than left. However, an underlying right apical mass is difficult to entirely exclude given the surrounding apical fluid obscuring this region (series 4/ image 22). No pneumothorax. Upper abdomen: Visualized upper abdomen is notable for vascular calcifications. Musculoskeletal: Mild to moderate superior endplate compression fracture deformity at L1, unchanged. IMPRESSION: Suspected multifocal pneumonia with superimposed interstitial edema. Moderate right and small left pleural effusions. Suspected biapical pleural-parenchymal scarring, right greater than left. Underlying right apical mass is difficult to entirely exclude given the surrounding fluid on the current study. Consider follow-up CT chest in 3 months after resolution of acute illness. Electronically Signed   By: Julian Hy M.D.   On: 07/24/2015 10:32   Dg Chest Port 1 View  07/26/2015  CLINICAL DATA:  Shortness of Breath EXAM: PORTABLE CHEST 1 VIEW COMPARISON:  07/24/2015 FINDINGS: Cardiomediastinal silhouette is stable. Central mild vascular congestion without convincing pulmonary edema. Persistent patchy airspace disease bilaterally left greater than right suspicious for multifocal pneumonia. Small bilateral pleural effusion with bilateral basilar atelectasis or infiltrate. IMPRESSION: Central mild vascular congestion without convincing pulmonary edema. Persistent patchy airspace disease bilaterally left greater than right suspicious for multifocal pneumonia. Small bilateral pleural effusion with bilateral basilar atelectasis or infiltrate. Electronically Signed   By: Lahoma Crocker M.D.   On: 07/26/2015 08:46     LOS: 1 day   Oren Binet, MD  [facility] Pager:[phone]  If 7PM-7AM, please contact night-coverage [email or web address] Password [facility] 07/26/2015, 10:47 AM

## 2015-07-26 NOTE — Consult Note (Signed)
Name: Megan Munoz MRN: [id number] DOB: 02/25/1933    ADMISSION DATE:  07/24/2015 CONSULTATION DATE:  07/26/2015  REFERRING MD :    CHIEF COMPLAINT:  Bilateral Pleural effusions : evaluate for Thorocentesis  BRIEF PATIENT DESCRIPTION: Thin elderly female supine in bed wearing nasal oxygen at 2L   SIGNIFICANT EVENTS  Recent hospitalization( 2 weeks ago) x 6 days in [place] for Pneumonia D/C'd on Cefdinir 20 Lb weight gain since hospital D/C( Per daughter dry weight is 126 lbs) Admission BNP: 2954 Admitted with multifocal pneumonia/interstitial edema/ bilateral pleural effusions CT shows  additional right apical mass that will require follow up   STUDIES: 07/24/2015: CT Chest:  IMPRESSION: Suspected multifocal pneumonia with superimposed interstitial edema. Moderate right and small left pleural effusions. Suspected biapical pleural-parenchymal scarring, right greater than left. Underlying right apical mass is difficult to entirely exclude given the surrounding fluid on the current study. Consider follow-up CT chest in 3 months after resolution of acute illness.  07/23/2015: CXR IMPRESSION: Right-greater-than-left patchy consolidative opacities concerning for multi focal pneumonia in the appropriate clinical setting.  Right-greater-than-left small bilateral pleural effusions.  Interval worsening right-greater-than-left biapical pleural parenchymal thickening which is nonspecific and may be secondary to scarring or infection. Malignancy is not excluded.  07/26/2015:Echo>>> pending  Filed Weights   07/24/15 1349 07/25/15 0358 07/26/15 0529  Weight: 146 lb 2 oz (66.282 kg) 135 lb 11.2 oz (61.553 kg) 135 lb 2.3 oz (61.3 kg)    Net Negative: 3580 cc last 48 hours with Lasix  HISTORY OF PRESENT ILLNESS:    82 y.o. 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 She was in [place] 2 weeks ago, at which time she was diagnosed with pneumonia, admitted to the local hospital where she remained for 6 days for treatment. She was released on Cefdinir.Since D/C she has had a 20 pound weight gain, increasing SOB, orthopnea, and increasing lower extremity edema bilaterally. BNP on admission was 2954,chest x-ray showed multifocal pneumonia, with superimposed interstitial edema and moderate right, and small left pleural effusions. There was also notation of suspicious right apical mass that was not clearly visualized due to surrounding fluid.This will need outpatient follow up with CT chest in 3 months.Pt is lasix naive and is responding well to lasix. She is not yet at her dry weight which is 126 lbs per daughter.PCCM have been asked to see patient  and evaluate pleural effusions for possible thoracentesis.  PAST MEDICAL HISTORY :   has a past medical history of Anxiety; Cerebral infarction ([place]) (10/2008); A-fib ([place]) (03/2013); Hypertension; Tinea unguium; Hyperlipemia; and Tremor.  has past surgical history that includes Carpal tunnel release (Left); Total abdominal hysterectomy; Foot surgery (Right); Bladder surgery; Hernia repair; and Nasal sinus surgery. Prior to Admission medications   Medication Sig Start Date End Date Taking? Authorizing Provider  albuterol (ACCUNEB) 0.63 MG/3ML nebulizer solution Take 3 mLs by nebulization every 6 (six) hours as needed for wheezing or shortness of breath.  07/03/14  Yes Historical Provider, MD  B Complex Vitamins (VITAMIN B COMPLEX PO) Take 1 tablet by mouth daily.    Yes Historical Provider, MD  Calcium Carb-Cholecalciferol (CALCIUM + D3) 600-200 MG-UNIT TABS Take 1-2 tablets by mouth 2 (two) times daily. 2 tablets in the morning and 1 tablet in the evening.   Yes Historical Provider, MD  carvedilol (COREG)  12.5 MG tablet Take 12.5 mg by mouth 2 (two)  times daily with a meal.   Yes Historical Provider, MD  cefdinir (OMNICEF) 300 MG capsule Take 300 mg by mouth 2 (two) times daily. Started 07/15/15, for 10 days ending 07/24/15   Yes Historical Provider, MD  diltiazem (DILTIAZEM CD) 120 MG 24 hr capsule Take 360 mg by mouth daily.   Yes Historical Provider, MD  diphenhydrAMINE (SOMINEX) 25 MG tablet Take 50 mg by mouth at bedtime.    Yes Historical Provider, MD  ferrous sulfate 325 (65 FE) MG tablet Take 325 mg by mouth daily with breakfast.   Yes Historical Provider, MD  Flaxseed, Linseed, (EQL FLAX SEED OIL) 1000 MG CAPS Take 1,000 mg by mouth daily.    Yes Historical Provider, MD  FLUoxetine (PROZAC) 20 MG capsule Take 20 mg by mouth daily.   Yes Historical Provider, MD  loratadine (CLARITIN) 10 MG tablet Take 10 mg by mouth daily.   Yes Historical Provider, MD  Melatonin 10 MG TABS Take 10 mg by mouth at bedtime.    Yes Historical Provider, MD  memantine (NAMENDA) 10 MG tablet Take 1 tablet (10 mg total) by mouth 2 (two) times daily. 05/01/15  Yes Marcial Pacas, MD  Multiple Vitamin (MULTIVITAMIN) capsule Take 1 capsule by mouth daily.   Yes Historical Provider, MD  primidone (MYSOLINE) 250 MG tablet Take 125 mg by mouth 2 (two) times daily.    Yes Historical Provider, MD  rivaroxaban (XARELTO) 20 MG TABS tablet Take 20 mg by mouth daily with supper.   Yes Historical Provider, MD  simvastatin (ZOCOR) 40 MG tablet Take 40 mg by mouth daily.   Yes Historical Provider, MD  VENTOLIN HFA 108 (90 Base) MCG/ACT inhaler Inhale 2 puffs into the lungs daily as needed. 07/15/15  Yes Historical Provider, MD  atenolol (TENORMIN) 25 MG tablet Take 1 tablet (25 mg total) by mouth daily. 07/16/14   Leonie Man, MD   Allergies  Allergen Reactions  . Tetanus Toxoids Other (See Comments)    Told never to take     FAMILY HISTORY:  family history includes Asthma in her daughter; Cancer in her maternal grandmother; Emphysema in her father; Osteoporosis in her  mother; Stroke in her brother. SOCIAL HISTORY:  reports that she has quit smoking. She does not have any smokeless tobacco history on file. She reports that she does not drink alcohol or use illicit drugs.  REVIEW OF SYSTEMS:   Constitutional: Negative for+ fever, chills, + weight loss, + malaise/fatigue and diaphoresis.  HENT: Negative for hearing loss, ear pain, nosebleeds, congestion, sore throat, neck pain, tinnitus and ear discharge.   Eyes: Negative for blurred vision, double vision, photophobia, pain, discharge and redness.  Respiratory: Negative for cough, hemoptysis, sputum production, + shortness of breath with exertion, no wheezing and stridor.   Cardiovascular: Negative for chest pain, palpitations, + orthopnea, no claudication,+ lower extremity edema and no PND.  Gastrointestinal: Negative for heartburn, nausea, vomiting, abdominal pain, diarrhea, constipation, blood in stool and melena.  Genitourinary: Negative for dysuria, urgency, frequency, hematuria and flank pain.  Musculoskeletal: Negative for myalgias, back pain, joint pain and falls.  Skin: Negative for itching and rash.  Neurological: Negative for dizziness, tingling, + tremors, no sensory change, speech change, focal weakness, seizures, loss of consciousness, weakness and headaches. + conative deficits 2/2 previous stroke. Endo/Heme/Allergies: Negative for environmental allergies and polydipsia. Does not bruise/bleed easily.  SUBJECTIVE: Pt. States she gets short of breath  with exertion.  VITAL SIGNS: Temp:  [97.8 F (36.6 C)-98.1 F (36.7 C)] 98.1 F (36.7 C) (04/28 0533) Pulse Rate:  [64-99] 90 (04/28 0533) Resp:  [16-18] 16 (04/28 0533) BP: (120-123)/(49-56) 120/55 mmHg (04/28 0533) SpO2:  [95 %-100 %] 99 % (04/28 0533) Weight:  [135 lb 2.3 oz (61.3 kg)] 135 lb 2.3 oz (61.3 kg) (04/28 0529)  PHYSICAL EXAMINATION: General:  Alert pleasant elderly female, wearing oxygen Neuro:Sensation intact, DTR normal.  Strength 5/5 in all 4 extremities. Resting tremor    HEENT: Mucous membranes are moist. Posterior pharynx clear of any exudate or lesions. Normal dentition. Cardiovascular:  Tachycardic, irregular rhythm, no murmur, rub gallop, 2+ LE edema,-JVD Lungs:Deminished BS bilaterally R>L; no wheeze/ rales per bases, no accessory muscle use, no nasal flaring, no sternal retractions,  Abdomen: No tenderness, masses, soft to palpation,BS+   Musculoskeletal: no clubbing / cyanosis. Noted arthritic joint deformity upper and lower extremities. Good ROM, no contractures. Normal muscle tone.   Skin:No rashes, lesions, ulcers, warm and dry,      Recent Labs Lab 07/24/15 1330 07/25/15 0505 07/26/15 0634  NA 127* 131* 135  K 3.4* 3.0* 3.9  CL 91* 94* 96*  CO2 26 28 31   BUN 7 9 13   CREATININE 0.51 0.59 0.55  GLUCOSE 138* 95 104*    Recent Labs Lab 07/24/15 1330 07/25/15 0505 07/26/15 0634  HGB 10.8* 9.8* 9.5*  HCT 32.0* 29.5* 29.0*  WBC 6.5 4.7 5.0  PLT 408* 381 364   Dg Chest Port 1 View  07/26/2015  CLINICAL DATA:  Shortness of Breath EXAM: PORTABLE CHEST 1 VIEW COMPARISON:  07/24/2015 FINDINGS: Cardiomediastinal silhouette is stable. Central mild vascular congestion without convincing pulmonary edema. Persistent patchy airspace disease bilaterally left greater than right suspicious for multifocal pneumonia. Small bilateral pleural effusion with bilateral basilar atelectasis or infiltrate. IMPRESSION: Central mild vascular congestion without convincing pulmonary edema. Persistent patchy airspace disease bilaterally left greater than right suspicious for multifocal pneumonia. Small bilateral pleural effusion with bilateral basilar atelectasis or infiltrate. Electronically Signed   By: Lahoma Crocker M.D.   On: 07/26/2015 08:46    ASSESSMENT / PLAN:  Acute Respiratory Failure with hypoxia secondary to CHF exacerbation and HCAP. R>L Pleural Effusions per CT and CXR Dyspnea on  exertion  Plan: Evaluated per Bedside US by Dr. Titus Mould. Currently potential risk of thoracentesis is greater than benefit  Continue Lasix with goal of getting to patient's dry weight of 126 pounds Monitor renal function with diuresis ( BUN 13/ Creatinine 0.55) BMET daily Re-Evaluate clinical status Monday for improvement of symptoms Re-evaluate with [place] if remains symptomatic/ thoracentesis if clinically indicated Out Patient follow up / CT Chest Right Apical mass in 3 months   Magdalen Spatz, AGACNP-BC [address]   07/26/2015, 11:50 AM    STAFF NOTE: I, Merrie Roof, MD FACP have personally reviewed patient's available data, including medical history, events of note, physical examination and test results as part of my evaluation. I have discussed with resident/NP and other care providers such as pharmacist, RN and RRT. In addition, I personally evaluated patient and elicited key findings of: She is in no distress, reduced BS mild rt base, no increase accessory muslces, renal fxn wnl and allowing further diuresis, she was neg 920 cc last 24 hrs, I did [place] which showed a pockey single rt lateral, the pcxr was underimpressive, the CT overcalled effusion, [place] was tie breaker, would NOT do thora at this time,  would continued diuresis, if she remains with SOB on exertion would consider thora then, she is at risk for bleeding ith DTI she takes for anticoagulation, risk benefit ratio does not favor tap at this time, change change, will assess Monday, i personallyupdated pt and daughter at [place]. Titus Mould, MD, [place] Pgr: [facility] 07/26/2015 8:28 PM

## 2015-07-26 NOTE — Progress Notes (Signed)
  Echocardiogram 2D Echocardiogram has been performed.  Megan Munoz 07/26/2015, 12:04 PM

## 2015-07-26 NOTE — Evaluation (Signed)
Physical Therapy Evaluation Patient Details Name: Megan Munoz MRN: [id number] DOB: Dec 19, 1932 Today's Date: 07/26/2015   History of Present Illness  Pt is a 82 year old WF admitted with acute respiratory failure with hypoxia secondary to CHF exacerbation with HCAP.  Pt was in hospital in [place] 2 weeks ago with UTI and has gained 20 pounds since d/c there.  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  Clinical Impression  Pt admitted with above diagnosis. Pt currently with functional limitations due to the deficits listed below (see PT Problem List).  Pt will benefit from skilled PT to increase their independence and safety with mobility to allow discharge to the venue listed below.       Follow Up Recommendations Home health PT;Supervision/Assistance - 24 hour    Equipment Recommendations  None recommended by PT    Recommendations for Other Services       Precautions / Restrictions Precautions Precautions: Fall      Mobility  Bed Mobility Overal bed mobility: Needs Assistance Bed Mobility: Supine to Sit;Sit to Supine     Supine to sit: Min guard;HOB elevated Sit to supine: Min guard;HOB elevated   General bed mobility comments: Increased time to complete. +rail  Transfers Overall transfer level: Needs assistance Equipment used: 1 person hand held assist Transfers: Sit to/from [facility] Sit to Stand: Min guard Stand pivot transfers: Min guard       General transfer comment: verbal cues for safety  Ambulation/Gait Ambulation/Gait assistance: Min assist Ambulation Distance (Feet): 30 Feet Assistive device: 1 person hand held assist Gait Pattern/deviations: Step-through pattern;Decreased stride length Gait velocity: decreased   General Gait Details: Pt fatigues quickly. Pt ambulated in room on RA.   Stairs            Wheelchair Mobility    Modified Rankin (Stroke Patients Only)       Balance                                              Pertinent Vitals/Pain Pain Assessment: No/denies pain    Home Living Family/patient expects to be discharged to:: Private residence Living Arrangements: Children Available Help at Discharge: Family;Other (Comment) (unsure availability) Type of Home: House Home Access: Stairs to enter Entrance Stairs-Rails: None Entrance Stairs-Number of Steps: 1 Home Layout: Two level Home Equipment: Toilet riser;Shower seat;Cane - single point      Prior Function Level of Independence: Independent with assistive device(s)         Comments: Pt lives with daughter who has 5 children.  Bedroom is on 2nd floor.  Daughter wants to move pt downstairs but pt really likes her room upstairs; it gives her some privacy so she has personal goals to stay upstairs and manage the steps.     Hand Dominance   Dominant Hand: Right    Extremity/Trunk Assessment   Upper Extremity Assessment: Defer to OT evaluation           Lower Extremity Assessment: Generalized weakness      Cervical / Trunk Assessment: Normal  Communication   Communication: No difficulties  Cognition Arousal/Alertness: Awake/alert Behavior During Therapy: WFL for tasks assessed/performed Overall Cognitive Status: History of cognitive impairments - at baseline       Memory: Decreased short-term memory              General Comments  Exercises        Assessment/Plan    PT Assessment Patient needs continued PT services  PT Diagnosis Difficulty walking;Generalized weakness   PT Problem List Decreased strength;Decreased activity tolerance;Decreased balance;Decreased mobility;Cardiopulmonary status limiting activity;Decreased safety awareness;Decreased cognition  PT Treatment Interventions DME instruction;Gait training;Stair training;Functional mobility training;Therapeutic activities;Therapeutic exercise;Patient/family education;Balance training   PT Goals (Current goals can be  found in the Care Plan section) Acute Rehab PT Goals Patient Stated Goal: to hopefully go home over the weekend. PT Goal Formulation: With patient Time For Goal Achievement: 08/09/15 Potential to Achieve Goals: Good    Frequency Min 3X/week   Barriers to discharge        Co-evaluation               End of Session Equipment Utilized During Treatment: Gait belt;Oxygen ([place] in place after ambulation.) Activity Tolerance: Patient tolerated treatment well Patient left: in bed;with nursing/sitter in room;with call bell/phone within reach (Pt returned to bed for RN to place foley.) Nurse Communication: Mobility status         Time: [id number] PT Time Calculation (min) (ACUTE ONLY): 15 min   Charges:   PT Evaluation $PT Eval Moderate Complexity: 1 Procedure     PT G Codes:        Lorriane Shire 07/26/2015, 11:46 AM

## 2015-07-26 NOTE — Care Management Important Message (Signed)
Important Message  Patient Details  Name: Tonette Butch MRN: [id number] Date of Birth: 07-18-1932   Medicare Important Message Given:  Yes    Sharin Mons, RN 07/26/2015, 8:55 AM

## 2015-07-26 NOTE — Procedures (Signed)
[place] chest   1. Moderate one pocket rt lateral chest, free flowing, lung above and below 2. Mild effusion at best in mid posterior lower chest rt 3. No sig effusion left  Lavon Paganini. Titus Mould, MD, [place] Pgr: [facility]

## 2015-07-27 LAB — BASIC METABOLIC PANEL
Anion gap: 8 (ref 5–15)
BUN: 10 mg/dL (ref 6–20)
CO2: 33 mmol/L — ABNORMAL HIGH (ref 22–32)
Calcium: 8.7 mg/dL — ABNORMAL LOW (ref 8.9–10.3)
Chloride: 94 mmol/L — ABNORMAL LOW (ref 101–111)
Creatinine, Ser: 0.44 mg/dL (ref 0.44–1.00)
Glucose, Bld: 100 mg/dL — ABNORMAL HIGH (ref 65–99)
POTASSIUM: 3.5 mmol/L (ref 3.5–5.1)
SODIUM: 135 mmol/L (ref 135–145)

## 2015-07-27 LAB — PREALBUMIN: PREALBUMIN: 8.5 mg/dL — AB (ref 18–38)

## 2015-07-27 LAB — HEPATIC FUNCTION PANEL
ALT: 20 U/L (ref 14–54)
AST: 29 U/L (ref 15–41)
Albumin: 2.5 g/dL — ABNORMAL LOW (ref 3.5–5.0)
Alkaline Phosphatase: 67 U/L (ref 38–126)
Bilirubin, Direct: 0.2 mg/dL (ref 0.1–0.5)
Indirect Bilirubin: 0.4 mg/dL (ref 0.3–0.9)
TOTAL PROTEIN: 5.6 g/dL — AB (ref 6.5–8.1)
Total Bilirubin: 0.6 mg/dL (ref 0.3–1.2)

## 2015-07-27 LAB — GLUCOSE, CAPILLARY
Glucose-Capillary: 96 mg/dL (ref 65–99)
Glucose-Capillary: 111 mg/dL — ABNORMAL HIGH (ref 65–99)

## 2015-07-27 MED ORDER — DIPHENHYDRAMINE HCL 25 MG PO CAPS
25.0000 mg | ORAL_CAPSULE | Freq: Three times a day (TID) | ORAL | Status: DC | PRN
  Administered 2015-07-27 – 2015-07-28 (×2): 25 mg via ORAL
  Filled 2015-07-27 (×2): qty 1

## 2015-07-27 MED ORDER — FUROSEMIDE 10 MG/ML IJ SOLN
20.0000 mg | Freq: Two times a day (BID) | INTRAMUSCULAR | Status: DC
  Administered 2015-07-27 – 2015-07-28 (×3): 20 mg via INTRAVENOUS
  Filled 2015-07-27 (×2): qty 2

## 2015-07-27 NOTE — Progress Notes (Signed)
Pharmacy Antibiotic Note  Megan Munoz is a 82 y.o. female admitted on 07/24/2015 with possible PNA.  Pharmacy has been consulted for levaquin dosing. -SCr= 0.44, estimated CrCl ~ 50 (and actual clearance is likely less)  Plan: -Levaquin 750mg  IV q48hr -Will follow renal function, cultures and clinical progress   Height: 5' 6.93" (170 cm) (from entry on 12/11/14) Weight: 127 lb 14.4 oz (58.015 kg) IBW/kg (Calculated) : 61.44  Temp (24hrs), Avg:98.3 F (36.8 C), Min:98.1 F (36.7 C), Max:98.6 F (37 C)   Recent Labs Lab 07/24/15 1330 07/24/15 1352 07/24/15 1629 07/25/15 0505 07/26/15 0634 07/27/15 0705  WBC 6.5  --   --  4.7 5.0  --   CREATININE 0.51  --   --  0.59 0.55 0.44  LATICACIDVEN  --  1.53 1.26  --   --   --     Estimated Creatinine Clearance: 51.4 mL/min (by C-G formula based on Cr of 0.44).    Allergies  Allergen Reactions  . Tetanus Toxoids Other (See Comments)    Told never to take     Antimicrobials this admission: Vanc 4/26 >> 4/27 Cefepime 4/26 >> 4/27 Levaquin 4/28>>   Microbiology results: 4/26 blood x 2: ngtd  4/26 urine insig growth = neg/ F  Thank you for allowing pharmacy to be a part of this patient's care.  Hildred Laser, Pharm D 07/27/2015 3:41 PM

## 2015-07-27 NOTE — Progress Notes (Signed)
Red rash noted to torso and groin, denies itching, no new meds noted for today, hypoallergenic sheets ordered and T. Rogue Bussing, NP notified.

## 2015-07-27 NOTE — Progress Notes (Signed)
PROGRESS NOTE    Megan Munoz  [id number] DOB: Oct 23, 1932 DOA: 07/24/2015 PCP: [name], MD     Brief Narrative:  Patient is a 82 y.o. female with past medical history of permanent atrial fibrillation on anticoagulation who presented with shortness of breath-which was felt to be multifactorial from new onset acute decompensated heart failure and possible pneumonia. Please see below for further details.  Assessment & Plan:   Acute hypoxic respiratory failure - TTE was normal, no hypertrophy, no EF abnormality - She has pretty significantly low protein with albumin of 2.5 and pre-albumin of 2.5 - Would postulate that she has poor nutrition, volume overload and acute infection in a pneumonia explaining - Continue diuresis with lasix, oxygen, treatment of pneumonia - Strict I/O, daily weights.  She is 7 liters negative.  Decrease lasix to 20mg  IV BID - Foley in place - Nutrition consult  Multifocal pneumonia with pleural effusion - Respiratory status improving today - Continue current therapy - Continue antibiotics, Levaquin.   Hypervolemic hyponatremia - Improved with diuresis, continue  Atrial fibrillation, permanent - Continue coreg, diltiazem, xarelto  HTN - BP well controlled - Continue coreg, diltiazem  HLD - Continue atorvastatin  H/O CVA/Dementia - Redirection, keep day sunny and night dark if possible - Continue namenda  Essential Tremor - Continue primidone  ? Rt apical lung mass.  - Repeat CT scan in 3 months  Anemia of chronic disease - Trend and follow  Diet Heart healthy   DVT prophylaxis: Xarelto Code Status: DNR  Disposition Plan: Pending further improvement, 1-2 days.    Consultants:   CCM for thoracentesis  Procedures:   none  Antimicrobials:   Cefepime 4/26 --> 07/25/15   Vancomycin 4/26 --> 4/27  Levaquin 4/28 --> current   Subjective: Elderly woman, appears somewhat confused.  Not oriented to place, time or  situation.  She states she is breathing well and not in any pain.  She notes that she eats "normally" at home.    Objective: Filed Vitals:   07/26/15 1802 07/26/15 2103 07/27/15 0303 07/27/15 0507  BP: 120/52 111/58  122/59  Pulse: 120 106  94  Temp:  98.6 F (37 C)  98.1 F (36.7 C)  TempSrc:    Oral  Resp:  18  17  Height:      Weight:   127 lb 14.4 oz (58.015 kg)   SpO2: 98% 95%  98%    Intake/Output Summary (Last 24 hours) at 07/27/15 0733 Last data filed at 07/27/15 0250  Gross per 24 hour  Intake    480 ml  Output   4700 ml  Net  -4220 ml   Filed Weights   07/25/15 0358 07/26/15 0529 07/27/15 0303  Weight: 135 lb 11.2 oz (61.553 kg) 135 lb 2.3 oz (61.3 kg) 127 lb 14.4 oz (58.015 kg)    Examination:  General exam: Appears calm and comfortable, [place] in place Respiratory system:  Respiratory effort normal.  Decreased breath sounds at bases Cardiovascular system: S1 & S2 heard, RR, NR. No pedal edema. Gastrointestinal system: Abdomen is scaphoid, soft and nontender. Normal bowel sounds heard. Central nervous system: Alert and oriented to person only. No focal neurological deficits. Extremities: Pain to palpation below knee, medially on both legs, no obvious lesions.  Knees without swelling.  Skin: No rashes, lesions or ulcers Psychiatry:  Mood & affect appropriate.     Data Reviewed: I have personally reviewed following labs and imaging studies  CBC:  Recent Labs Lab 07/24/15  1330 07/25/15 0505 07/26/15 0634  WBC 6.5 4.7 5.0  NEUTROABS  --   --  2.4  HGB 10.8* 9.8* 9.5*  HCT 32.0* 29.5* 29.0*  MCV 94.1 94.9 97.0  PLT 408* 381 [id number]   Basic Metabolic Panel:  Recent Labs Lab 07/24/15 1330 07/25/15 0505 07/26/15 0634  NA 127* 131* 135  K 3.4* 3.0* 3.9  CL 91* 94* 96*  CO2 26 28 31   GLUCOSE 138* 95 104*  BUN 7 9 13   CREATININE 0.51 0.59 0.55  CALCIUM 8.7* 8.0* 8.7*  MG  --   --  1.7  PHOS  --   --  2.9   GFR: Estimated Creatinine Clearance: 51.4  mL/min (by C-G formula based on Cr of 0.55). Liver Function Tests:  Recent Labs Lab 07/25/15 0505  AST 24  ALT 18  ALKPHOS 63  BILITOT 0.5  PROT 5.2*  ALBUMIN 2.5*   Thyroid Function Tests:  Recent Labs  07/24/15 1830  TSH 0.937   Anemia Panel:  Recent Labs  07/26/15 0634  VITAMINB12 1110*  FOLATE 16.1  FERRITIN 311*  TIBC 204*  IRON 16*   Urine analysis:    Component Value Date/Time   COLORURINE YELLOW 07/24/2015 1345   APPEARANCEUR CLOUDY* 07/24/2015 1345   LABSPEC 1.036* 07/24/2015 1345   PHURINE 7.5 07/24/2015 1345   GLUCOSEU NEGATIVE 07/24/2015 1345   HGBUR SMALL* 07/24/2015 1345   BILIRUBINUR NEGATIVE 07/24/2015 1345   KETONESUR 15* 07/24/2015 1345   PROTEINUR NEGATIVE 07/24/2015 1345   UROBILINOGEN 0.2 07/07/2014 1451   NITRITE NEGATIVE 07/24/2015 1345   LEUKOCYTESUR NEGATIVE 07/24/2015 1345    Recent Results (from the past 240 hour(s))  Urine culture     Status: Abnormal   Collection Time: 07/24/15  1:45 PM  Result Value Ref Range Status   Specimen Description URINE, RANDOM  Final   Special Requests NONE  Final   Culture 1,000 COLONIES/mL INSIGNIFICANT GROWTH (A)  Final   Report Status 07/25/2015 FINAL  Final  Culture, blood (routine x 2) Call MD if unable to obtain prior to antibiotics being given     Status: None (Preliminary result)   Collection Time: 07/24/15  5:40 PM  Result Value Ref Range Status   Specimen Description BLOOD LEFT ANTECUBITAL  Final   Special Requests BOTTLES DRAWN AEROBIC AND ANAEROBIC 5CC  Final   Culture NO GROWTH 2 DAYS  Final   Report Status PENDING  Incomplete  Culture, blood (routine x 2) Call MD if unable to obtain prior to antibiotics being given     Status: None (Preliminary result)   Collection Time: 07/24/15  5:45 PM  Result Value Ref Range Status   Specimen Description BLOOD LEFT HAND  Final   Special Requests BOTTLES DRAWN AEROBIC AND ANAEROBIC 5CC  Final   Culture NO GROWTH 2 DAYS  Final   Report Status  PENDING  Incomplete     Radiology Studies: Dg Chest Port 1 View  07/26/2015  CLINICAL DATA:  Shortness of Breath EXAM: PORTABLE CHEST 1 VIEW COMPARISON:  07/24/2015 FINDINGS: Cardiomediastinal silhouette is stable. Central mild vascular congestion without convincing pulmonary edema. Persistent patchy airspace disease bilaterally left greater than right suspicious for multifocal pneumonia. Small bilateral pleural effusion with bilateral basilar atelectasis or infiltrate. IMPRESSION: Central mild vascular congestion without convincing pulmonary edema. Persistent patchy airspace disease bilaterally left greater than right suspicious for multifocal pneumonia. Small bilateral pleural effusion with bilateral basilar atelectasis or infiltrate. Electronically Signed   By: Lahoma Crocker  M.D.   On: 07/26/2015 08:46     Scheduled Meds: . atorvastatin  20 mg Oral q1800  . B-complex with vitamin C  1 tablet Oral Daily  . calcium-vitamin D  2 tablet Oral Q breakfast   And  . calcium-vitamin D  1 tablet Oral QHS  . carvedilol  6.25 mg Oral BID WC  . diltiazem  180 mg Oral Daily  . feeding supplement (ENSURE ENLIVE)  237 mL Oral TID BM  . ferrous sulfate  325 mg Oral Q breakfast  . FLUoxetine  20 mg Oral Daily  . furosemide  40 mg Intravenous BID  . levofloxacin (LEVAQUIN) IV  750 mg Intravenous Q48H  . loratadine  10 mg Oral Daily  . memantine  10 mg Oral BID  . multivitamin with minerals  1 tablet Oral Daily  . primidone  125 mg Oral BID  . rivaroxaban  20 mg Oral Q supper  . sodium chloride flush  3 mL Intravenous Q12H  . sodium chloride flush  3 mL Intravenous Q12H  . zolpidem  5 mg Oral QHS   Continuous Infusions:    LOS: 2 days    Time spent: 30 minutes    Gilles Chiquito, MD [facility] Pager [phone]  If 7PM-7AM, please contact night-coverage [email or web address] Password [facility] 07/27/2015, 7:33 AM

## 2015-07-28 LAB — BASIC METABOLIC PANEL
Anion gap: 8 (ref 5–15)
BUN: 16 mg/dL (ref 6–20)
CO2: 34 mmol/L — AB (ref 22–32)
Calcium: 8.9 mg/dL (ref 8.9–10.3)
Chloride: 91 mmol/L — ABNORMAL LOW (ref 101–111)
Creatinine, Ser: 0.55 mg/dL (ref 0.44–1.00)
GFR calc Af Amer: >60 mL/min (ref >60)
GLUCOSE: 108 mg/dL — AB (ref 65–99)
POTASSIUM: 3.6 mmol/L (ref 3.5–5.1)
Sodium: 133 mmol/L — ABNORMAL LOW (ref 135–145)

## 2015-07-28 LAB — PROCALCITONIN

## 2015-07-28 LAB — STREP PNEUMONIAE URINARY ANTIGEN: Strep Pneumo Urinary Antigen: NEGATIVE

## 2015-07-28 MED ORDER — LEVOFLOXACIN 750 MG PO TABS
750.0000 mg | ORAL_TABLET | Freq: Every day | ORAL | Status: DC
  Administered 2015-07-28 – 2015-07-29 (×2): 750 mg via ORAL
  Filled 2015-07-28 (×2): qty 1

## 2015-07-28 NOTE — Progress Notes (Signed)
Red rash unchanged, patient  Bathed and hypoallergenic linen applied by NT, Benadryl given as ordered. Jacqualyn Posey, RN

## 2015-07-28 NOTE — Progress Notes (Signed)
PROGRESS NOTE    Megan Munoz  [id number] DOB: October 30, 1932 DOA: 07/24/2015 PCP: [name], MD     Brief Narrative:  Patient is a 81 y.o. female with past medical history of permanent atrial fibrillation on anticoagulation who presented with shortness of breath-which was felt to be multifactorial from new onset acute decompensated heart failure and possible pneumonia. Please see below for further details.  Assessment & Plan:   Acute hypoxic respiratory failure - TTE was normal, no hypertrophy, no EF abnormality - She has pretty significantly low protein with albumin of 2.5 and pre-albumin of 8.5 - PCT < 0.10 X 3 - Would postulate that she has poor nutrition, volume overload and acute infection with pneumonia explaining these symptoms - She is much improved, stopping lasix - Levaquin for 5-7 day course - Strict I/O, daily weights.  She is 10 liters negative by measurement, but no ins measured, stop lasix.   - Foley in place, consider trial of foley out tomorrow - Nutrition consult pending  Multifocal pneumonia with pleural effusion - Respiratory status much improved today - Continue current therapy - Continue antibiotics, Levaquin. - Attempt wean off of oxygen tomorrow  - PCCM evaluated last week, doubt she will need thoracentesis  Hypervolemic hyponatremia - Improved with diuresis, 133 today - Trend, stopping diuretics today  Atrial fibrillation, permanent - Continue coreg, diltiazem, xarelto - Likely has SSS with low pulse alternating with tachycardia  HTN - BP well controlled - Continue coreg, diltiazem  HLD - Continue atorvastatin  H/O CVA/Dementia - Redirection, keep day sunny and night dark if possible - Continue namenda - She is very pleasantly demented - Update family tomorrow  Macular rash - Present on Vulva, not itchy or painful - Blanches, unclear cause - Patient cannot given much detail - Monitor  Essential Tremor - Continue primidone  ?  Rt apical lung mass.  - Repeat CT scan in 3 months  Diet Heart healthy   DVT prophylaxis: Xarelto Code Status: DNR  Disposition Plan: Pending further improvement, 1-2 days.    Consultants:   CCM for thoracentesis  Procedures:   none  Antimicrobials:   Cefepime 4/26 --> 07/25/15   Vancomycin 4/26 --> 4/27  Levaquin 4/28 --> current   Subjective: Pleasant woman, no acute issues noted, she thinks her breathing is better.    Objective: Filed Vitals:   07/27/15 2240 07/27/15 2243 07/28/15 0516 07/28/15 0516  BP: 102/49   119/57  Pulse: 37 113  83  Temp:    97.8 F (36.6 C)  TempSrc:    Oral  Resp:    16  Height:      Weight:   126 lb 4.8 oz (57.289 kg)   SpO2: 79% 98%  98%    Intake/Output Summary (Last 24 hours) at 07/28/15 0927 Last data filed at 07/27/15 2244  Gross per 24 hour  Intake      0 ml  Output   2600 ml  Net  -2600 ml   Filed Weights   07/26/15 0529 07/27/15 0303 07/28/15 0516  Weight: 135 lb 2.3 oz (61.3 kg) 127 lb 14.4 oz (58.015 kg) 126 lb 4.8 oz (57.289 kg)    Examination:  General exam: Appears calm and comfortable, [place] in place Respiratory system:  Respiratory effort normal.  Decreased breath sounds at bases Cardiovascular system: S1 & S2 heard, RR, NR. No pedal edema. Gastrointestinal system: Abdomen is scaphoid, soft and nontender. Normal bowel sounds heard. Central nervous system: Alert and oriented to person only.  No focal neurological deficits. Extremities: Pain to palpation below knee, medially on both legs, no obvious lesions.  Knees without swelling.  Skin: Blanching macular rash on vulva, RLQ of abdomen.  Appears to be heat rash or possibly a contact dermatitis.  No itching or pain.  No roughness or papular nature to the rash.  Psychiatry:  Mood & affect appropriate.     Data Reviewed: I have personally reviewed following labs and imaging studies  CBC:  Recent Labs Lab 07/24/15 1330 07/25/15 0505 07/26/15 0634  WBC  6.5 4.7 5.0  NEUTROABS  --   --  2.4  HGB 10.8* 9.8* 9.5*  HCT 32.0* 29.5* 29.0*  MCV 94.1 94.9 97.0  PLT 408* 381 123456   Basic Metabolic Panel:  Recent Labs Lab 07/24/15 1330 07/25/15 0505 07/26/15 0634 07/27/15 0705 07/28/15 0647  NA 127* 131* 135 135 133*  K 3.4* 3.0* 3.9 3.5 3.6  CL 91* 94* 96* 94* 91*  CO2 26 28 31  33* 34*  GLUCOSE 138* 95 104* 100* 108*  BUN 7 9 13 10 16   CREATININE 0.51 0.59 0.55 0.44 0.55  CALCIUM 8.7* 8.0* 8.7* 8.7* 8.9  MG  --   --  1.7  --   --   PHOS  --   --  2.9  --   --    GFR: Estimated Creatinine Clearance: 50.7 mL/min (by C-G formula based on Cr of 0.55). Liver Function Tests:  Recent Labs Lab 07/25/15 0505 07/27/15 0839  AST 24 29  ALT 18 20  ALKPHOS 63 67  BILITOT 0.5 0.6  PROT 5.2* 5.6*  ALBUMIN 2.5* 2.5*   Thyroid Function Tests: No results for input(s): TSH, T4TOTAL, FREET4, T3FREE, THYROIDAB in the last 72 hours. Anemia Panel:  Recent Labs  07/26/15 0634  VITAMINB12 1110*  FOLATE 16.1  FERRITIN 311*  TIBC 204*  IRON 16*   Urine analysis:    Component Value Date/Time   COLORURINE YELLOW 07/24/2015 1345   APPEARANCEUR CLOUDY* 07/24/2015 1345   LABSPEC 1.036* 07/24/2015 1345   PHURINE 7.5 07/24/2015 1345   GLUCOSEU NEGATIVE 07/24/2015 1345   HGBUR SMALL* 07/24/2015 1345   BILIRUBINUR NEGATIVE 07/24/2015 1345   KETONESUR 15* 07/24/2015 1345   PROTEINUR NEGATIVE 07/24/2015 1345   UROBILINOGEN 0.2 07/07/2014 1451   NITRITE NEGATIVE 07/24/2015 1345   LEUKOCYTESUR NEGATIVE 07/24/2015 1345    Recent Results (from the past 240 hour(s))  Urine culture     Status: Abnormal   Collection Time: 07/24/15  1:45 PM  Result Value Ref Range Status   Specimen Description URINE, RANDOM  Final   Special Requests NONE  Final   Culture 1,000 COLONIES/mL INSIGNIFICANT GROWTH (A)  Final   Report Status 07/25/2015 FINAL  Final  Culture, blood (routine x 2) Call MD if unable to obtain prior to antibiotics being given      Status: None (Preliminary result)   Collection Time: 07/24/15  5:40 PM  Result Value Ref Range Status   Specimen Description BLOOD LEFT ANTECUBITAL  Final   Special Requests BOTTLES DRAWN AEROBIC AND ANAEROBIC 5CC  Final   Culture NO GROWTH 3 DAYS  Final   Report Status PENDING  Incomplete  Culture, blood (routine x 2) Call MD if unable to obtain prior to antibiotics being given     Status: None (Preliminary result)   Collection Time: 07/24/15  5:45 PM  Result Value Ref Range Status   Specimen Description BLOOD LEFT HAND  Final   Special Requests  BOTTLES DRAWN AEROBIC AND ANAEROBIC 5CC  Final   Culture NO GROWTH 3 DAYS  Final   Report Status PENDING  Incomplete     Radiology Studies: No results found.   Scheduled Meds: . atorvastatin  20 mg Oral q1800  . B-complex with vitamin C  1 tablet Oral Daily  . calcium-vitamin D  2 tablet Oral Q breakfast   And  . calcium-vitamin D  1 tablet Oral QHS  . carvedilol  6.25 mg Oral BID WC  . diltiazem  180 mg Oral Daily  . feeding supplement (ENSURE ENLIVE)  237 mL Oral TID BM  . ferrous sulfate  325 mg Oral Q breakfast  . FLUoxetine  20 mg Oral Daily  . furosemide  20 mg Intravenous BID  . levofloxacin (LEVAQUIN) IV  750 mg Intravenous Q48H  . loratadine  10 mg Oral Daily  . memantine  10 mg Oral BID  . multivitamin with minerals  1 tablet Oral Daily  . primidone  125 mg Oral BID  . rivaroxaban  20 mg Oral Q supper  . sodium chloride flush  3 mL Intravenous Q12H  . sodium chloride flush  3 mL Intravenous Q12H  . zolpidem  5 mg Oral QHS   Continuous Infusions:    LOS: 3 days    Time spent: 30 minutes    Gilles Chiquito, MD [facility] Pager [phone]  If 7PM-7AM, please contact night-coverage [email or web address] Password [facility] 07/28/2015, 9:27 AM

## 2015-07-29 ENCOUNTER — Other Ambulatory Visit: Payer: Self-pay

## 2015-07-29 DIAGNOSIS — E440 Moderate protein-calorie malnutrition: Secondary | ICD-10-CM

## 2015-07-29 DIAGNOSIS — R0902 Hypoxemia: Secondary | ICD-10-CM

## 2015-07-29 DIAGNOSIS — J90 Pleural effusion, not elsewhere classified: Secondary | ICD-10-CM

## 2015-07-29 DIAGNOSIS — J810 Acute pulmonary edema: Secondary | ICD-10-CM

## 2015-07-29 LAB — CULTURE, BLOOD (ROUTINE X 2)
CULTURE: NO GROWTH
Culture: NO GROWTH

## 2015-07-29 NOTE — Care Management Note (Signed)
Case Management Note  Patient Details  Name: Megan Munoz MRN: [id number] Date of Birth: 01/26/1933  Subjective/Objective:                  Admitted with acute respiratory failure with hypoxia secondary to CHF exacerbation with HCAP.   Action/Plan: Plan to discharge with home health services(RN,PT). [facility] to provide community support.  Expected Discharge Date:    07/29/2015           Expected Discharge Plan:  [place]  In-House Referral:     Discharge planning Services  CM Consult  Post Acute Care Choice:    Choice offered to:  Patient, Adult Children  DME Arranged:    DME Agency:     HH Arranged:  OT, PT [place] Agency:  [place] [facility]  Status of Service:  In process, will continue to follow  Medicare Important Message Given:  Yes Date Medicare IM Given:    Medicare IM give by:    Date Additional Medicare IM Given:    Additional Medicare Important Message give by:     If discussed at [place] of Stay Meetings, dates discussed:    Additional Comments:  Sharin Mons, RN 07/29/2015, 12:11 PM

## 2015-07-29 NOTE — Progress Notes (Signed)
Occupational Therapy Treatment Patient Details Name: Megan Munoz MRN: [id number] DOB: Jul 30, 1932 Today's Date: 07/29/2015    History of present illness Pt is a 82 year old WF admitted with acute respiratory failure with hypoxia secondary to CHF exacerbation with HCAP.  Pt was in hospital in [place] 2 weeks ago with UTI and has gained 20 pounds since d/c there.  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   OT comments  Pt up to the sink with walker to perform grooming task. Discussed energy conservation techniques and issued handout as pt does fatigue quickly with minimal activity. O2 sats on RA initially standing at the sink was 95% and HR 107. After standing for several minutes to perform activity and starting to fatigue, O2 sats briefly at 80% but encouraged purse lip breathing and sats up to 93% quickly. Nursing made aware. Will follow.   Follow Up Recommendations  Home health OT;Supervision/Assistance - 24 hour    Equipment Recommendations  None recommended by OT    Recommendations for Other Services      Precautions / Restrictions Precautions Precautions: Fall Precaution Comments: monitor O2 and HR Restrictions Weight Bearing Restrictions: No       Mobility Bed Mobility Overal bed mobility: Needs Assistance Bed Mobility: Supine to Sit     Supine to sit: Supervision     General bed mobility comments: in chair.   Transfers Overall transfer level: Needs assistance Equipment used: Rolling walker (2 wheeled) Transfers: Sit to/from Stand Sit to Stand: Min guard         General transfer comment: cues for hand placement and scooting to EOC before standing.    Balance Overall balance assessment: Needs assistance Sitting-balance support: No upper extremity supported;Feet supported Sitting balance-Leahy Scale: Good     Standing balance support: Bilateral upper extremity supported;During functional activity Standing balance-Leahy Scale: Fair                      ADL       Grooming: Oral care;Min guard;Standing                   Toilet Transfer: Min guard;Ambulation;RW             General ADL Comments: Daughter present for session. Discussed at length energy conservation techniques with pt and daughter and issued handout to pt and reviewed. Discussed having a chair at the sink in the bathroom initially to sit and perform ADL or if she needs to rest. Discussed purse lip breathing importance and not holding her breath. She fatigued quickly at the sink standing to brush her teeth. O2 sats on RA initially standing at the sink was 95%  and HR 107. Once pt finished brushing teeth, she was started to fatigue and requesting to sit down. Sats taken again and briefly down to 80% ? if pt holding breath. encouraged PLB and quickly up to 93% Returned to recliner and O2 93% and HR 101. .       Vision                     Perception     Praxis      Cognition   Behavior During Therapy: WFL for tasks assessed/performed Overall Cognitive Status: History of cognitive impairments - at baseline       Memory: Decreased short-term memory               Extremity/Trunk Assessment  Exercises     Shoulder Instructions       General Comments      Pertinent Vitals/ Pain       Pain Assessment: No/denies pain  Home Living                                          Prior Functioning/Environment              Frequency Min 2X/week     Progress Toward Goals  OT Goals(current goals can now be found in the care plan section)  Progress towards OT goals: Progressing toward goals  Acute Rehab OT Goals Patient Stated Goal: go home  Plan Discharge plan remains appropriate    Co-evaluation                 End of Session Equipment Utilized During Treatment: Rolling walker   Activity Tolerance Patient limited by fatigue   Patient Left in chair;with call bell/phone within  reach   Nurse Communication          Time: 1001-1031 OT Time Calculation (min): 30 min  Charges: OT General Charges $OT Visit: 1 Procedure OT Treatments $Self Care/Home Management : 8-22 mins $Therapeutic Activity: 8-22 mins  Jules Schick 07/29/2015, 10:50 AM

## 2015-07-29 NOTE — Progress Notes (Addendum)
PROGRESS NOTE                                                                                                                                                                                                             Patient Demographics:    Megan Munoz, is a 82 y.o. female, DOB - 11-07-1932, [id number]  Admit date - 07/24/2015   Admitting Physician Waldemar Dickens, MD  Outpatient Primary MD for the patient is [facility], MD  LOS - 4  Outpatient Specialists:   Chief Complaint  Patient presents with  . Shortness of Breath       Brief Narrative   82 year old female with history of A. fib on anticoagulation hypertension, hyperlipidemia, history of CVA with mild dementia, essential tremor presented with acute hypoxic respiratory failure in the setting of decompensated CHF and multifocal pneumonia with pleural effusion. In the ED patient was found to be volume overloaded with BNP of almost 2000, chest excision multifocal pneumonia and interstitial edema with moderate right-sided pleural effusion. Off note she was in [place] 2 weeks ago and admitted to hospital there with pneumonia.    Subjective:   Seen and examined. Appears tired although breathing seems improved. Desatted to high 80s on emulation and became tachycardic to 120s.   Assessment  & Plan :    Principal Problem: Acute hypoxic respiratory failure Likely a combination of multifocal pneumonia with pleural effusion and acute diastolic dysfunction. 2-D echo shows EF of 55 and 60% with possible mild basal mid inferolateral hypokinesis. Continue Levaquin until 5/2 (seven-day course). Lasix held as patient appeared dehydrated. Sats stable on room air but drops to high 80s on ambulation and was tachycardic. I think physical deconditioning is playing a major role here. -Appreciate pulmonary follow-up. Repeat chest x-ray in a.m evaluate effusion. -Patient is  close to her dry weight of 126 pound.    Active Problems:   Permanent atrial fibrillation (HCC) Xarelto. Rate control on Cardizem and Coreg.       Essential hypertension Stable. Continue home medications    Hyponatremia Secondary to pneumonia and hypervolemic hypernatremia. Now improved.    Anemia of chronic disease/iron deficiency Stable. Continue iron supplement.    Malnutrition of moderate degree Added supplement.  Right apical lung mass Incidentally seen on chest CT. Recommend follow-up CT in 3 months.  Mild dementia Continue  Namenda     Code Status : DO NOT RESUSCITATE  Family Communication  : Daughter updated on the phone. She has concerns that she would not be able to provide necessary care for her at home and is overwhelmed with her 5 children. Also had concern about the right apical mass and extending to her that he needs to be further evaluated with follow-up CT in 3 months once pneumonia has completely cleared and does not have pleural effusion to obscure the view.  Disposition Plan  : Social work [facility]  Barriers For Discharge :  Ongoing hypoxia and tachycardia  Consults  :  Pulmonary  Procedures  : CT chest  DVT Prophylaxis  :  Xarelto  Lab Results  Component Value Date   PLT 364 07/26/2015    Antibiotics  :   Anti-infectives    Start     Dose/Rate Route Frequency Ordered Stop   07/28/15 2000  levofloxacin (LEVAQUIN) tablet 750 mg     750 mg Oral Daily at bedtime 07/28/15 1836     07/26/15 1200  levofloxacin (LEVAQUIN) IVPB 750 mg  Status:  Discontinued     750 mg 100 mL/hr over 90 Minutes Intravenous Every 48 hours 07/26/15 1112 07/28/15 1836   07/25/15 0400  vancomycin (VANCOCIN) 500 mg in sodium chloride 0.9 % 100 mL IVPB  Status:  Discontinued     500 mg 100 mL/hr over 60 Minutes Intravenous Every 12 hours 07/24/15 1533 07/25/15 1436   07/24/15 2200  ceFEPIme (MAXIPIME) 1 g in dextrose 5 % 50 mL IVPB   Status:  Discontinued     1 g 100 mL/hr over 30 Minutes Intravenous Every 8 hours 07/24/15 1654 07/25/15 1436   07/24/15 1515  ceFEPIme (MAXIPIME) 1 g in dextrose 5 % 50 mL IVPB     1 g 100 mL/hr over 30 Minutes Intravenous  Once 07/24/15 1507 07/24/15 1622   07/24/15 1515  vancomycin (VANCOCIN) 1,250 mg in sodium chloride 0.9 % 250 mL IVPB     1,250 mg 166.7 mL/hr over 90 Minutes Intravenous  Once 07/24/15 1509 07/24/15 1821        Objective:   Filed Vitals:   07/29/15 0458 07/29/15 0544 07/29/15 1020 07/29/15 1400  BP:  118/55    Pulse:  95 107   Temp:  98.2 F (36.8 C)    TempSrc:  Oral    Resp:  18    Height: 5\' 6"  (1.676 m)     Weight: 58.3 kg (128 lb 8.5 oz)     SpO2:  97% 80% 89%    Wt Readings from Last 3 Encounters:  07/29/15 58.3 kg (128 lb 8.5 oz)  12/11/14 58.514 kg (129 lb)  10/31/14 59.194 kg (130 lb 8 oz)     Intake/Output Summary (Last 24 hours) at 07/29/15 1431 Last data filed at 07/29/15 1415  Gross per 24 hour  Intake    610 ml  Output   1450 ml  Net   -840 ml     Physical Exam  Gen: not in distress HEENT: no pallor, moist mucosa, supple neck Chest: clear b/l, no added sounds CVS: N S1&S2, no murmurs, rubs or gallop GI: soft, NT, ND, BS+ Musculoskeletal: warm, no edema CNS: AAOX3, non focal    Data Review:    CBC  Recent Labs Lab 07/24/15 1330 07/25/15 0505 07/26/15 0634  WBC 6.5 4.7 5.0  HGB 10.8* 9.8* 9.5*  HCT 32.0* 29.5* 29.0*  PLT 408* 381 364  MCV 94.1 94.9 97.0  MCH 31.8 31.5 31.8  MCHC 33.8 33.2 32.8  RDW 13.6 13.8 13.9  LYMPHSABS  --   --  1.4  MONOABS  --   --  1.0  EOSABS  --   --  0.2  BASOSABS  --   --  0.0    Chemistries   Recent Labs Lab 07/24/15 1330 07/25/15 0505 07/26/15 0634 07/27/15 0705 07/27/15 0839 07/28/15 0647  NA 127* 131* 135 135  --  133*  K 3.4* 3.0* 3.9 3.5  --  3.6  CL 91* 94* 96* 94*  --  91*  CO2 26 28 31  33*  --  34*  GLUCOSE 138* 95 104* 100*  --  108*  BUN 7 9 13 10   --   16  CREATININE 0.51 0.59 0.55 0.44  --  0.55  CALCIUM 8.7* 8.0* 8.7* 8.7*  --  8.9  MG  --   --  1.7  --   --   --   AST  --  24  --   --  29  --   ALT  --  18  --   --  20  --   ALKPHOS  --  63  --   --  67  --   BILITOT  --  0.5  --   --  0.6  --    ------------------------------------------------------------------------------------------------------------------ No results for input(s): CHOL, HDL, LDLCALC, TRIG, CHOLHDL, LDLDIRECT in the last 72 hours.  No results found for: HGBA1C ------------------------------------------------------------------------------------------------------------------ No results for input(s): TSH, T4TOTAL, T3FREE, THYROIDAB in the last 72 hours.  Invalid input(s): FREET3 ------------------------------------------------------------------------------------------------------------------ No results for input(s): VITAMINB12, FOLATE, FERRITIN, TIBC, IRON, RETICCTPCT in the last 72 hours.  Coagulation profile No results for input(s): INR, PROTIME in the last 168 hours.  No results for input(s): DDIMER in the last 72 hours.  Cardiac Enzymes No results for input(s): CKMB, TROPONINI, MYOGLOBIN in the last 168 hours.  Invalid input(s): CK ------------------------------------------------------------------------------------------------------------------    Component Value Date/Time   BNP 188.2* 07/26/2015 1057    Inpatient Medications  Scheduled Meds: . atorvastatin  20 mg Oral q1800  . B-complex with vitamin C  1 tablet Oral Daily  . calcium-vitamin D  2 tablet Oral Q breakfast   And  . calcium-vitamin D  1 tablet Oral QHS  . carvedilol  6.25 mg Oral BID WC  . diltiazem  180 mg Oral Daily  . feeding supplement (ENSURE ENLIVE)  237 mL Oral TID BM  . ferrous sulfate  325 mg Oral Q breakfast  . FLUoxetine  20 mg Oral Daily  . levofloxacin  750 mg Oral QHS  . loratadine  10 mg Oral Daily  . memantine  10 mg Oral BID  . multivitamin with minerals  1  tablet Oral Daily  . primidone  125 mg Oral BID  . rivaroxaban  20 mg Oral Q supper  . sodium chloride flush  3 mL Intravenous Q12H  . sodium chloride flush  3 mL Intravenous Q12H  . zolpidem  5 mg Oral QHS   Continuous Infusions:  PRN Meds:.sodium chloride, sodium chloride, acetaminophen **OR** acetaminophen, albuterol, ALPRAZolam, bisacodyl, diltiazem, diphenhydrAMINE, HYDROcodone-acetaminophen, magnesium citrate, ondansetron **OR** ondansetron (ZOFRAN) IV, polyvinyl alcohol, senna-docusate, sodium chloride flush, sodium chloride flush  Micro Results Recent Results (from the past 240 hour(s))  Urine culture     Status: Abnormal   Collection Time: 07/24/15  1:45 PM  Result Value Ref  Range Status   Specimen Description URINE, RANDOM  Final   Special Requests NONE  Final   Culture 1,000 COLONIES/mL INSIGNIFICANT GROWTH (A)  Final   Report Status 07/25/2015 FINAL  Final  Culture, blood (routine x 2) Call MD if unable to obtain prior to antibiotics being given     Status: None   Collection Time: 07/24/15  5:40 PM  Result Value Ref Range Status   Specimen Description BLOOD LEFT ANTECUBITAL  Final   Special Requests BOTTLES DRAWN AEROBIC AND ANAEROBIC 5CC  Final   Culture NO GROWTH 5 DAYS  Final   Report Status 07/29/2015 FINAL  Final  Culture, blood (routine x 2) Call MD if unable to obtain prior to antibiotics being given     Status: None   Collection Time: 07/24/15  5:45 PM  Result Value Ref Range Status   Specimen Description BLOOD LEFT HAND  Final   Special Requests BOTTLES DRAWN AEROBIC AND ANAEROBIC 5CC  Final   Culture NO GROWTH 5 DAYS  Final   Report Status 07/29/2015 FINAL  Final    Radiology Reports Dg Chest 2 View  07/23/2015  CLINICAL DATA:  Follow-up pneumonia.  Severe shortness of breath. EXAM: CHEST  2 VIEW COMPARISON:  Chest radiograph 07/07/2014 FINDINGS: Stable cardiac and mediastinal contours. Scattered patchy areas of consolidation throughout the  right-greater-than-left lungs. Small bilateral pleural effusions, right-greater-than-left. Interval progression of right-greater-than-left biapical pleural parenchymal thickening. Additionally there is pleural thickening along the left upper lateral hemi thorax. Regional skeleton is unremarkable. IMPRESSION: Right-greater-than-left patchy consolidative opacities concerning for multi focal pneumonia in the appropriate clinical setting. Right-greater-than-left small bilateral pleural effusions. Interval worsening right-greater-than-left biapical pleural parenchymal thickening which is nonspecific and may be secondary to scarring or infection. Malignancy is not excluded. Given the multitude of findings, many of which are progressed or worsened from prior chest radiograph, short-term followup radiograph in 4- 6 weeks is recommended. If these findings persist, contrast-enhanced chest CT is recommended. Electronically Signed   By: Lovey Newcomer M.D.   On: 07/23/2015 13:46   Ct Chest W Contrast  07/24/2015  CLINICAL DATA:  Severe shortness of breath, cough, evaluate for multifocal pneumonia versus mass EXAM: CT CHEST WITH CONTRAST TECHNIQUE: Multidetector CT imaging of the chest was performed during intravenous contrast administration. CONTRAST:  44mL ISOVUE-300 IOPAMIDOL (ISOVUE-300) INJECTION 61% COMPARISON:  Chest radiographs dated 07/23/2015 FINDINGS: Mediastinum/Nodes: The heart is top-normal in size. No pericardial effusion. Coronary atherosclerosis. Atherosclerotic calcifications of the aortic arch. Small mediastinal lymph nodes, including a 12 mm short axis low right paratracheal node (series 3/ image 51), favored to be reactive. Visualized thyroid is unremarkable. Lungs/Pleura: Evaluation of the lung parenchyma is constrained by respiratory motion. Multifocal patchy/ground-glass opacities, with associated interlobular septal thickening in the bilateral upper lobes. This appearance favors multifocal infection with  superimposed interstitial edema. Associated moderate right and small left pleural effusions. Mild compressive atelectasis in the bilateral lower lobes. Suspected biapical pleural-parenchymal scarring, right greater than left. However, an underlying right apical mass is difficult to entirely exclude given the surrounding apical fluid obscuring this region (series 4/ image 22). No pneumothorax. Upper abdomen: Visualized upper abdomen is notable for vascular calcifications. Musculoskeletal: Mild to moderate superior endplate compression fracture deformity at L1, unchanged. IMPRESSION: Suspected multifocal pneumonia with superimposed interstitial edema. Moderate right and small left pleural effusions. Suspected biapical pleural-parenchymal scarring, right greater than left. Underlying right apical mass is difficult to entirely exclude given the surrounding fluid on the current study. Consider follow-up CT  chest in 3 months after resolution of acute illness. Electronically Signed   By: Julian Hy M.D.   On: 07/24/2015 10:32   Dg Chest Port 1 View  07/26/2015  CLINICAL DATA:  Shortness of Breath EXAM: PORTABLE CHEST 1 VIEW COMPARISON:  07/24/2015 FINDINGS: Cardiomediastinal silhouette is stable. Central mild vascular congestion without convincing pulmonary edema. Persistent patchy airspace disease bilaterally left greater than right suspicious for multifocal pneumonia. Small bilateral pleural effusion with bilateral basilar atelectasis or infiltrate. IMPRESSION: Central mild vascular congestion without convincing pulmonary edema. Persistent patchy airspace disease bilaterally left greater than right suspicious for multifocal pneumonia. Small bilateral pleural effusion with bilateral basilar atelectasis or infiltrate. Electronically Signed   By: Lahoma Crocker M.D.   On: 07/26/2015 08:46    Time Spent in minutes  25   Louellen Molder M.D on 07/29/2015 at 2:31 PM  Between 7am to 7pm - Pager -  [phone]  After 7pm go to [email or web address] - password [email or web address]  [facility] -  Office  [phone]

## 2015-07-29 NOTE — Progress Notes (Signed)
Physical Therapy Treatment Patient Details Name: Bari Mozie MRN: [id number] DOB: [birth year] Today's Date: 07/29/2015    History of Present Illness Pt is a 82 year old WF admitted with acute respiratory failure with hypoxia secondary to CHF exacerbation with HCAP.  Pt was in hospital in [place] 2 weeks ago with UTI and has gained 20 pounds since d/c there.  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    PT Comments    Patient ambulated on RA with SpO2 remaining above 90% and HR elevated to 126. Educated pt on energy conservation and self monitoring need for rest breaks. Pt may benefit from rollator for energy conservation due to pt becoming fatigued quickly with OOB mobility. Continue to progress as tolerated.   Follow Up Recommendations  Home health PT;Supervision/Assistance - 24 hour     Equipment Recommendations  Other (comment) rollator   Recommendations for Other Services       Precautions / Restrictions Precautions Precautions: Fall Restrictions Weight Bearing Restrictions: No    Mobility  Bed Mobility Overal bed mobility: Needs Assistance Bed Mobility: Supine to Sit     Supine to sit: Supervision     General bed mobility comments: supervision for safety; increased time needed; HOB flat and no use of bed rails; cues for sequencing and to initiate movements  Transfers Overall transfer level: Needs assistance Equipment used: None Transfers: Sit to/from Stand Sit to Stand: Min guard         General transfer comment: min guard for safety; safe hand placement  Ambulation/Gait Ambulation/Gait assistance: Supervision Ambulation Distance: 45ft Assistive device: Rolling walker (2 wheeled) Gait Pattern/deviations: Step-through pattern;Decreased stride length;Trunk flexed Gait velocity: decreased   General Gait Details: cues for position of RW and posture; ambulated on RA with SpO2 remaining 92-96% and HR elevating to 126; pt reported feeling a "flutter" when HR  126 and pt educated on self monitoring need for rest breaks; One standing rest break  Stairs            Wheelchair Mobility    Modified Rankin (Stroke Patients Only)       Balance Overall balance assessment: Needs assistance Sitting-balance support: No upper extremity supported;Feet supported Sitting balance-Leahy Scale: Good     Standing balance support: Bilateral upper extremity supported;During functional activity Standing balance-Leahy Scale: Fair                      Cognition Arousal/Alertness: Awake/alert Behavior During Therapy: WFL for tasks assessed/performed Overall Cognitive Status: History of cognitive impairments - at baseline       Memory: Decreased short-term memory              Exercises      General Comments General comments (skin integrity, edema, etc.): pt educated on energy conservation and discussed sleeping downstairs until HHPT is able to practice stairs in home      Pertinent Vitals/Pain Pain Assessment: No/denies pain    Home Living                      Prior Function            PT Goals (current goals can now be found in the care plan section) Acute Rehab PT Goals Patient Stated Goal: go home Progress towards PT goals: Progressing toward goals    Frequency  Min 3X/week    PT Plan Current plan remains appropriate    Co-evaluation  End of Session Equipment Utilized During Treatment: Gait belt Activity Tolerance: Patient tolerated treatment well Patient left: in chair;with call bell/phone within reach     Time: 0911-0936 PT Time Calculation (min) (ACUTE ONLY): 25 min  Charges:  $Gait Training: 8-22 mins $Therapeutic Activity: 8-22 mins                    G Codes:      Salina April, PTA Pager: [phone]   07/29/2015, 9:45 AM

## 2015-07-29 NOTE — Progress Notes (Signed)
Spoke with physician about discharge. MD wants to ambulate without oxygen . Patient ambulated 346ft and oxygen dropped to 89%. HR was elevated to 120. MD made aware.   Daughter has concerns about late discharge. MD paged and asked to call Kim at [phone]

## 2015-07-29 NOTE — Plan of Care (Signed)
Problem: Education: Goal: Knowledge of [facility] Education information/materials will improve Outcome: Progressing Patient educated on new medications and ensure. Patient verbalized understanding for the importance of protein and nutrition.

## 2015-07-29 NOTE — Plan of Care (Signed)
Problem: Education: Goal: Knowledge of [facility] Education information/materials will improve Outcome: Progressing Patient educated on the importance of drinking water. Patient agrees to drink more to flush out kidneys.

## 2015-07-29 NOTE — Consult Note (Signed)
Name: Megan Munoz MRN: [id number] DOB: [birth year]    ADMISSION DATE:  07/24/2015 CONSULTATION DATE:  07/26/2015  REFERRING MD :    CHIEF COMPLAINT:  Bilateral Pleural effusions : evaluate for Thorocentesis  BRIEF PATIENT DESCRIPTION: 82 y.o. 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 She was in [place] 2 weeks ago, at which time she was diagnosed with pneumonia, admitted to the local hospital where she remained for 6 days for treatment. She was released on Cefdinir.Since D/C she has had a 20 pound weight gain, increasing SOB, orthopnea, and increasing lower extremity edema bilaterally. BNP on admission was 2954, chest x-ray showed multifocal pneumonia, with superimposed interstitial edema and moderate right, and small left pleural effusions. There was also notation of suspicious right apical mass that was not clearly visualized due to surrounding fluid.This will need outpatient follow up with CT chest in 3 months.Pt is lasix naive and is responding well to lasix. She is not yet at her dry weight which is 126 lbs per daughter.PCCM have been asked to see patient  and evaluate pleural effusions for possible thoracentesis.   SIGNIFICANT EVENTS  Recent hospitalization( 2 weeks ago) x 6 days in [place] for Pneumonia D/C'd on Cefdinir 20 Lb weight gain since hospital D/C( Per daughter dry weight is 126 lbs) Admission BNP: 2954 Admitted with multifocal pneumonia/interstitial edema/ bilateral pleural effusions CT shows  additional right apical mass that will require follow up   STUDIES: CT Chest 4/26 :  Suspected multifocal pneumonia with superimposed interstitial edema. Moderate right and small left pleural effusions. Suspected biapical pleural-parenchymal scarring, right greater than left. Underlying right apical  mass is difficult to entirely exclude given the surrounding fluid on the current study. Consider follow-up CT chest in 3 months after resolution of acute illness. 07/26/2015:Echo> LVEF 55-60%, Possible mild hypokinesis of the basal-midinferolateral myocardium. PA peak 35mmHg  SUBJECTIVE: breathing improved, edema improved. Reports that she is breathing at baseline.   Filed Weights   07/27/15 0303 07/28/15 0516 07/29/15 0458  Weight: 58.015 kg (127 lb 14.4 oz) 57.289 kg (126 lb 4.8 oz) 58.3 kg (128 lb 8.5 oz)    Net Negative: 12L cc over admit with lasix although ? Intake documentation accuracy.  VITAL SIGNS: Temp:  [97.5 F (36.4 C)-98.2 F (36.8 C)] 98.2 F (36.8 C) (05/01 0544) Pulse Rate:  [91-107] 107 (05/01 1020) Resp:  [18] 18 (05/01 0544) BP: (98-118)/(51-55) 118/55 mmHg (05/01 0544) SpO2:  [80 %-99 %] 80 % (05/01 1020) Weight:  [58.3 kg (128 lb 8.5 oz)] 58.3 kg (128 lb 8.5 oz) (05/01 0458)  PHYSICAL EXAMINATION: General:  Alert pleasant elderly female, wearing oxygen Neuro:Sensation intact, DTR normal. Strength 5/5 in all 4 extremities. Resting tremor. Strange affect  HEENT: [place]/AT, PERRL, no appreciable jvd Cardiovascular:  Tachycardic, irregular rhythm, no MRG,  trace edema Lungs:few scattered crackles Abdomen: No tenderness, masses, soft to palpation,BS+   Musculoskeletal: no clubbing / cyanosis. Noted arthritic joint deformity upper and lower extremities. Good ROM, no contractures. Normal muscle tone.   Skin:No rashes, lesions, ulcers, warm and dry,      Recent Labs Lab 07/26/15 0634 07/27/15 0705 07/28/15 0647  NA 135 135 133*  K 3.9 3.5 3.6  CL 96* 94* 91*  CO2 31 33* 34*  BUN 13 10 16   CREATININE 0.55 0.44 0.55  GLUCOSE 104* 100* 108*  Recent Labs Lab 07/24/15 1330 07/25/15 0505 07/26/15 0634  HGB 10.8* 9.8* 9.5*  HCT 32.0* 29.5* 29.0*  WBC 6.5 4.7 5.0  PLT 408* 381 364   No results found.  ASSESSMENT / PLAN:  Acute respiratory failure  with hypoxia secondary to CHF exacerbation and HCAP. R>L Pleural Effusions per CT and CXR - Evaluated per Bedside US by Dr. Titus Mould 4/28 no need for thora at that time and symptoms have been subjectively improving.   Plan: Wean FiO2 as able to keep sats > 90% Continue Lasix with goal of getting to patient's dry weight of 126 pounds (acheived 5/1) Monitor renal function, BMET daily Will defer thoracentesis as symptoms improving with risk of her being on DTI Repeat CXR in AM to eval progression of infiltrates/effusions Ambulatory desaturation assessment prior to DC  Out Patient follow up / CT Chest Right Apical mass in 3 months  Georgann Housekeeper, AGACNP-BC [facility] Pager [phone] or [phone]  07/29/2015 12:38 PM  Attending Note:  82 year old female with history of pleural effusion likely related to fluid overload.  Responded to diureses.  U/S revealed not enough fluid for a thora.  On exam, bibasilar crackles.  Patient remains on O2.  I reviewed CXR myself, small effusion noted.  Discussed with PCCM-NP.  Pleural effusion:  - Diureses as able.  - Monitor periodically with CXR when symptomatic.  Hypoxemia:  - Titrate O2 for sat of 88-92%.  - Ambulatory desaturation prior to discharge for ?of home O2.  Apical mass:  - Chest CT in 3 months.  Please arrange f/u with PCCM upon discharge.  PCCM will follow.  Patient seen and examined, agree with above note.  I dictated the care and orders written for this patient under my direction.  Rush Farmer, MD [phone]

## 2015-07-29 NOTE — Progress Notes (Signed)
Foley removed at 1100. Patient DTV by 1700. Education given

## 2015-07-29 NOTE — Consult Note (Signed)
   [facility] CM Inpatient Consult   07/29/2015  Megan Munoz 07-26-1932 [id number]   Patient evaluated for community based chronic disease management services with [facility] as a benefit of patient's Medicare Insurance. Spoke with patient at bedside to explain [facility] services. Patient states she lives with her daughter, Maudie Mercury.  Admitted with HCAP and HF.  Patient endorses Dr. [name] as her primary care provider.    Consent form signed.   Patient will receive post hospital discharge call and will be evaluated for monthly home visits for assessments and disease process education.  Left contact information and [facility] literature at bedside. Made Inpatient Case Manager aware that [facility] following. Of note, [facility] services does not replace or interfere with any services that are arranged by inpatient case management or social work.  For additional questions or referrals please contact:    Natividad Brood, RN BSN [facility] Liaison  [phone] business mobile phone Toll free office [phone]

## 2015-07-29 NOTE — Progress Notes (Signed)
UR COMPLETED  

## 2015-07-29 NOTE — Progress Notes (Signed)
Initial Nutrition Assessment  DOCUMENTATION CODES:   Non-severe (moderate) malnutrition in context of chronic illness  INTERVENTION:  -Ensure Enlive TID. Each supplement provides 350 kcals and 20 grams of protein.  -Continue to monitor for nutritional needs.   NUTRITION DIAGNOSIS:   Malnutrition related to chronic illness as evidenced by moderate depletion of body fat, severe depletion of muscle mass.  GOAL:   Patient will meet greater than or equal to 90% of their needs   MONITOR:   PO intake, Supplement acceptance, Labs, Weight trends, Skin, I & O's  REASON FOR ASSESSMENT:   Consult Assessment of nutrition requirement/status  ASSESSMENT:   82 y.o. 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 She was in [place] 2 weeks ago, at which time she was diagnosed with pneumonia, admitted to the local hospital where she remained for 6 days for treatment. She was released on Cefdinir. At the time, she was instructed to follow-up with her primary care physician. In the interim, she experienced a 20 pound weight gain since discharge, as well as increasing shortness of breath at the point that she cannot lie flat anymore. She can only sleep sitting vertically. In addition, she reports significant swelling in his in her legs bilaterally. She was seen by her primary care physician yesterday, at which time labs were performed, showing a BNP of 2954. Chest x-ray showed multifocal pneumonia, with superimposed interstitial edema and moderate right, and small left pleural effusions. Follow-up CT today confirmed the findings. In addition, suspicious mass was seen in the right apical area. She was instructed to present to the emergency Department for further workup and management of her symptoms.   Pt seen for consult for assessment  of nutritional needs. Pt BMI categorized as normal. Per chart, pt weight is back to UBW of 125-130 lbs. Per chart and pt report, pt has not experienced any significant weight loss in the past year. Per chart, pt is eating 25-100% of meals.   Pt tired and disinterested in speaking with intern at time of visit. Pt stated "I just want to stop talking and get some rest". Pt reports eating BID. Pt diet recall reveals pt eating nothing for breakfast, a sandwich and banana for lunch, and a home cooked/hot meal with family for dinner. Pt reports drinking Ensure at home but does not remember how much. Pt denies N/V and abdominal pain associated with eating. Pt reports good appetite PTA. Pt denies swallowing and chewing difficulty. When asked if intake has decreased over the past year, pt reports a variable intake. Will continue Ensure to supplement diet.  NFPE: Moderate-severe depletion of muscle mass, moderate depletion of fat mass, mild edema.   Meds reviewed; B complex w/ vitamin C, Cal-Vit D 500-200 mg-unit, Ferrous sulfate 325 mg, MVI w/ minerals. Labs reviewed; CBGs 96-11 mg/dl, Cl 91 mmol/L, Na 133 mmol/L.    Diet Order:  Diet Heart Room service appropriate?: Yes; Fluid consistency:: Thin; Fluid restriction:: 1500 mL Fluid  Skin:  Reviewed, no issues  Last BM:  4/30  Height:   Ht Readings from Last 1 Encounters:  07/26/15 5' 6.93" (1.7 m)    Weight:   Wt Readings from Last 1 Encounters:  07/29/15 128 lb 8.5 oz (58.3 kg)    Ideal Body Weight:  59 kg  BMI:  Body mass index is 20.17 kg/(m^2).  Estimated Nutritional  Needs:   Kcal:  1250-1450 (22-24 kcals/kg)  Protein:  75-85 g (1.4 g/kg)  Fluid:  per MD   EDUCATION NEEDS:   No education needs identified at this time  Geoffery Lyons, [place] Dietetic Intern Pager [phone]

## 2015-07-30 ENCOUNTER — Inpatient Hospital Stay (HOSPITAL_COMMUNITY): Payer: Medicare Other

## 2015-07-30 DIAGNOSIS — R918 Other nonspecific abnormal finding of lung field: Secondary | ICD-10-CM

## 2015-07-30 DIAGNOSIS — I5031 Acute diastolic (congestive) heart failure: Secondary | ICD-10-CM

## 2015-07-30 DIAGNOSIS — J181 Lobar pneumonia, unspecified organism: Principal | ICD-10-CM | POA: Diagnosis present

## 2015-07-30 DIAGNOSIS — J9601 Acute respiratory failure with hypoxia: Secondary | ICD-10-CM | POA: Diagnosis present

## 2015-07-30 MED ORDER — CARVEDILOL 12.5 MG PO TABS: 12.5000 mg | ORAL_TABLET | Freq: Two times a day (BID) | ORAL | Status: DC

## 2015-07-30 MED ORDER — ENSURE ENLIVE PO LIQD: 237.0000 mL | Freq: Three times a day (TID) | ORAL | Status: DC

## 2015-07-30 MED ORDER — FUROSEMIDE 20 MG PO TABS: 20.0000 mg | ORAL_TABLET | Freq: Every day | ORAL | Status: DC | PRN

## 2015-07-30 MED ORDER — DILTIAZEM HCL 30 MG PO TABS
30.0000 mg | ORAL_TABLET | Freq: Once | ORAL | Status: AC
  Administered 2015-07-30: 30 mg via ORAL
  Filled 2015-07-30: qty 1

## 2015-07-30 MED ORDER — DILTIAZEM HCL ER COATED BEADS 240 MG PO CP24: 240.0000 mg | ORAL_CAPSULE | Freq: Every day | ORAL | Status: DC

## 2015-07-30 NOTE — Clinical Documentation Improvement (Signed)
Internal Medicine  Can the diagnosis of CHF be further specified?     Acuity - Acute, Chronic, Acute on Chronic   Type - Systolic, Diastolic, Systolic and Diastolic  Other  Clinically Undetermined  Supporting Information: 07/26/15 Transthoracic Echocardiography 07/26/15 note: New Onset unspecified acute CHF: Resume Lasix-40 mg IV twice a day. Awaiting echocardiogram to see whether this is systolic or diastolic [id number] Consult: Acute respiratory failure with hypoxia secondary to CHF exacerbation and HCAP   Please exercise your independent, professional judgment when responding. A specific answer is not anticipated or expected. Please update your documentation within the medical record to reflect your response to this query. Thank you  Thank You,  [place] [phone]

## 2015-07-30 NOTE — Discharge Instructions (Signed)

## 2015-07-30 NOTE — Discharge Summary (Signed)
Physician Discharge Summary  Megan Munoz [id number] DOB: [birth year] DOA: 07/24/2015  PCP: [name], MD  Admit date: 07/24/2015 Discharge date: 07/30/2015  Time spent: 35 minutes  Recommendations for Outpatient Follow-up:   #1 discharge home with home health RN and PT. #2 Follow-up with PCP in 2 weeks. Please arrange CT of the chest with contrast in 3 months to follow-up on apical right lung mass  #3 Has follow-up with her cardiologist Dr. Ellyn Hack in 1 week   Discharge Diagnoses:  Principal Problem:   Acute respiratory failure with hypoxia ([name])   Active Problems:   Acute diastolic CHF (congestive heart failure) (HCC)   Permanent atrial fibrillation (HCC)   Hyperlipidemia   Essential hypertension   Hyponatremia   Anemia of chronic disease   Malnutrition of moderate degree   Lung mass   Lobar pneumonia due to unspecified organism   Discharge Condition: Fair  Diet recommendation: Heart healthy supplements  CODE STATUS: DO NOT RESUSCITATE  Filed Weights   07/28/15 0516 07/29/15 0458 07/30/15 0514  Weight: 57.289 kg (126 lb 4.8 oz) 58.3 kg (128 lb 8.5 oz) 56 kg (123 lb 7.3 oz)    History of present illness:  82 year old female with history of A. fib on anticoagulation hypertension, hyperlipidemia, history of CVA with mild dementia, essential tremor presented with acute hypoxic respiratory failure in the setting of decompensated CHF and multifocal pneumonia with pleural effusion. In the ED patient was found to be volume overloaded with BNP of almost 2000, chest excision multifocal pneumonia and interstitial edema with moderate right-sided pleural effusion. Off note she was in [place] 2 weeks ago and admitted to hospital there with pneumonia.  Hospital Course:   Principal Problem: Acute hypoxic respiratory failure Likely a combination of multifocal pneumonia with pleural effusion and acute diastolic dysfunction. 2-D echo shows EF of 55 and 60% with possible mild  basal mid inferolateral hypokinesis. Completed 7 day course of Levaquin today. Had received Lasix with good diuresis and held as she appeared dehydrated and has lost about 3 pounds from her baseline weight. O2 sat stable on room air. -Repeat chest x-ray done today shows significant improvement in the upper lobe infiltrates with small pleural effusions but no vascular congestion. -I have prescribed her low-dose Lasix for when necessary use. Also instructed daughter on monitoring her weight daily and contacting her PCP or cardiologist if patient has heart failure symptoms.  Patient stable to be discharged home. Will arrange home health RN and PT -Appreciate pulmonary follow-up. We will see her in the office in 3 months.   Active Problems:  Permanent atrial fibrillation (HCC) Xarelto. Patient becomes tachycardic with heart rate up to 120s on minimal exertion. I think this is primarily due to severe deconditioning. Resume home dose Coreg. Patient is on Cardizem 360 mg daily but was only receiving 120 mg here for reasons unclear. Have resumed all home dose of Cardizem upon discharge. Patient has follow-up with her cardiologist next week.    Essential hypertension Stable. Continue home medications   Hyponatremia Secondary to pneumonia and hypervolemic hypernatremia. Now improved.   Anemia of chronic disease/iron deficiency Stable. Continue iron supplement.   Malnutrition of moderate degree Continue ensure ( increased to 3 times daily)  Right apical lung mass Incidentally seen on chest CT. Recommend follow-up CT in 3 months.  Mild dementia Continue Namenda     Code Status : DO NOT RESUSCITATE  Family Communication : Daughter at bedside  Disposition Plan :  Home with HHRN/ PT  Consults : Pulmonary  Procedures :  CT chest 2D ECHO     Discharge Exam: Filed Vitals:   07/30/15 0953 07/30/15 1326  BP: 120/61   Pulse: 102 85  Temp:    Resp:       Gen: not  in distress, appears fatigued HEENT: no pallor, moist mucosa, supple neck Chest: clear b/l, no added sounds CVS: S1 and S2 irregular,, no murmurs, rubs or gallop GI: soft, NT, ND, BS+ Musculoskeletal: warm, no edema CNS: AAOX3, non focal  Discharge Instructions   Discharge Instructions    AMB Referral to [facility]    Complete by:  As directed   Reason for consult:  Post hospital monitoring - HF  Diagnoses of:  Heart Failure  Expected date of contact:  1-3 days (reserved for hospital discharges)  Please assign to community nurse for transition of care calls and assess for home visits. Questions please call:   Natividad Brood, RN BSN [facility] Liaison  [phone] business mobile phone Toll free office [phone]          Current Discharge Medication List    START taking these medications   Details  feeding supplement, ENSURE ENLIVE, (ENSURE ENLIVE) LIQD Take 237 mLs by mouth 3 (three) times daily between meals. Qty: 237 mL, Refills: 12    furosemide (LASIX) 20 MG tablet Take 1 tablet (20 mg total) by mouth daily as needed for fluid or edema. Qty: 30 tablet, Refills: 0      CONTINUE these medications which have CHANGED   Details  carvedilol (COREG) 12.5 MG tablet Take 1 tablet (12.5 mg total) by mouth 2 (two) times daily with a meal. Qty: 60 tablet, Refills: 0      CONTINUE these medications which have NOT CHANGED   Details  albuterol (ACCUNEB) 0.63 MG/3ML nebulizer solution Take 3 mLs by nebulization every 6 (six) hours as needed for wheezing or shortness of breath.  Refills: 0    B Complex Vitamins (VITAMIN B COMPLEX PO) Take 1 tablet by mouth daily.     Calcium Carb-Cholecalciferol (CALCIUM + D3) 600-200 MG-UNIT TABS Take 1-2 tablets by mouth 2 (two) times daily. 2 tablets in the morning and 1 tablet in the evening.    diltiazem (DILTIAZEM CD) 120 MG 24 hr capsule Take 360 mg by mouth daily.    diphenhydrAMINE (SOMINEX) 25 MG  tablet Take 50 mg by mouth at bedtime.     ferrous sulfate 325 (65 FE) MG tablet Take 325 mg by mouth daily with breakfast.    Flaxseed, Linseed, (EQL FLAX SEED OIL) 1000 MG CAPS Take 1,000 mg by mouth daily.     FLUoxetine (PROZAC) 20 MG capsule Take 20 mg by mouth daily.    loratadine (CLARITIN) 10 MG tablet Take 10 mg by mouth daily.    Melatonin 10 MG TABS Take 10 mg by mouth at bedtime.     memantine (NAMENDA) 10 MG tablet Take 1 tablet (10 mg total) by mouth 2 (two) times daily. Qty: 180 tablet, Refills: 3    Multiple Vitamin (MULTIVITAMIN) capsule Take 1 capsule by mouth daily.    primidone (MYSOLINE) 250 MG tablet Take 125 mg by mouth 2 (two) times daily.     rivaroxaban (XARELTO) 20 MG TABS tablet Take 20 mg by mouth daily with supper.    simvastatin (ZOCOR) 40 MG tablet Take 40 mg by mouth daily.    VENTOLIN HFA 108 (90 Base) MCG/ACT inhaler Inhale 2 puffs into  the lungs daily as needed. Refills: 0      STOP taking these medications     cefdinir (OMNICEF) 300 MG capsule      atenolol (TENORMIN) 25 MG tablet        Allergies  Allergen Reactions  . Tetanus Toxoids Other (See Comments)    Told never to take    Follow-up Information    Follow [facility] with [facility].   Specialty:  Home Health Services   Why:  HOME HEALTH RN, PT and OT arranged   Contact information:   [place] [address] [phone]       Follow up with [facility].   Why:   3 in 1/ BSC, rolling walker to be delivered to bedside prior to discharge   Contact information:   [address] [place] [address] [phone]       Follow up with Tera Partridge, MD On 10/30/2015.   Specialty:  Pulmonary Disease   Why:  1030AM [facility]   Contact information:   [address] [place] [address] [phone]       Follow up with [facility], MD In 1 week.   Specialty:  Family Medicine   Contact information:   [address] [place] [address] [phone]        The results of significant diagnostics from this hospitalization (including imaging, microbiology, ancillary and laboratory) are listed below for reference.    Significant Diagnostic Studies: Dg Chest 2 View  07/23/2015  CLINICAL DATA:  Follow-up pneumonia.  Severe shortness of breath. EXAM: CHEST  2 VIEW COMPARISON:  Chest radiograph 07/07/2014 FINDINGS: Stable cardiac and mediastinal contours. Scattered patchy areas of consolidation throughout the right-greater-than-left lungs. Small bilateral pleural effusions, right-greater-than-left. Interval progression of right-greater-than-left biapical pleural parenchymal thickening. Additionally there is pleural thickening along the left upper lateral hemi thorax. Regional skeleton is unremarkable. IMPRESSION: Right-greater-than-left patchy consolidative opacities concerning for multi focal pneumonia in the appropriate clinical setting. Right-greater-than-left small bilateral pleural effusions. Interval worsening right-greater-than-left biapical pleural parenchymal thickening which is nonspecific and may be secondary to scarring or infection. Malignancy is not excluded. Given the multitude of findings, many of which are progressed or worsened from prior chest radiograph, short-term followup radiograph in 4- 6 weeks is recommended. If these findings persist, contrast-enhanced chest CT is recommended. Electronically Signed   By: Lovey Newcomer M.D.   On: 07/23/2015 13:46   Ct Chest W Contrast  07/24/2015  CLINICAL DATA:  Severe shortness of breath, cough, evaluate for multifocal pneumonia versus mass EXAM: CT CHEST WITH CONTRAST TECHNIQUE: Multidetector CT imaging of the chest was performed during intravenous contrast administration. CONTRAST:  26mL ISOVUE-300 IOPAMIDOL (ISOVUE-300) INJECTION 61% COMPARISON:  Chest radiographs dated 07/23/2015 FINDINGS: Mediastinum/Nodes: The heart is top-normal in size. No  pericardial effusion. Coronary atherosclerosis. Atherosclerotic calcifications of the aortic arch. Small mediastinal lymph nodes, including a 12 mm short axis low right paratracheal node (series 3/ image 51), favored to be reactive. Visualized thyroid is unremarkable. Lungs/Pleura: Evaluation of the lung parenchyma is constrained by respiratory motion. Multifocal patchy/ground-glass opacities, with associated interlobular septal thickening in the bilateral upper lobes. This appearance favors multifocal infection with superimposed interstitial edema. Associated moderate right and small left pleural effusions. Mild compressive atelectasis in the bilateral lower lobes. Suspected biapical pleural-parenchymal scarring, right greater than left. However, an underlying right apical mass is difficult to entirely exclude given the surrounding apical  fluid obscuring this region (series 4/ image 22). No pneumothorax. Upper abdomen: Visualized upper abdomen is notable for vascular calcifications. Musculoskeletal: Mild to moderate superior endplate compression fracture deformity at L1, unchanged. IMPRESSION: Suspected multifocal pneumonia with superimposed interstitial edema. Moderate right and small left pleural effusions. Suspected biapical pleural-parenchymal scarring, right greater than left. Underlying right apical mass is difficult to entirely exclude given the surrounding fluid on the current study. Consider follow-up CT chest in 3 months after resolution of acute illness. Electronically Signed   By: Julian Hy M.D.   On: 07/24/2015 10:32   Dg Chest Port 1 View  07/30/2015  CLINICAL DATA:  Hypoxia. EXAM: PORTABLE CHEST 1 VIEW COMPARISON:  07/26/2015 .  07/07/2014.  07/02/2014.  CT 07/24/2015. FINDINGS: Mediastinum and hilar structures are normal. Cardiomegaly with normal pulmonary vascularity. Interim near complete clearing of bilateral pulmonary infiltrates. Stable biapical pleural thickening noted consistent  with scarring. Small bilateral pleural effusions . IMPRESSION: 1. Interim near complete clearing of bilateral upper lobe pulmonary infiltrates. Small bilateral pleural effusion again noted. 2. Stable cardiomegaly.  No pulmonary venous congestion . Electronically Signed   By: Marcello Moores  Register   On: 07/30/2015 07:48   Dg Chest Port 1 View  07/26/2015  CLINICAL DATA:  Shortness of Breath EXAM: PORTABLE CHEST 1 VIEW COMPARISON:  07/24/2015 FINDINGS: Cardiomediastinal silhouette is stable. Central mild vascular congestion without convincing pulmonary edema. Persistent patchy airspace disease bilaterally left greater than right suspicious for multifocal pneumonia. Small bilateral pleural effusion with bilateral basilar atelectasis or infiltrate. IMPRESSION: Central mild vascular congestion without convincing pulmonary edema. Persistent patchy airspace disease bilaterally left greater than right suspicious for multifocal pneumonia. Small bilateral pleural effusion with bilateral basilar atelectasis or infiltrate. Electronically Signed   By: Lahoma Crocker M.D.   On: 07/26/2015 08:46    Microbiology: Recent Results (from the past 240 hour(s))  Urine culture     Status: Abnormal   Collection Time: 07/24/15  1:45 PM  Result Value Ref Range Status   Specimen Description URINE, RANDOM  Final   Special Requests NONE  Final   Culture 1,000 COLONIES/mL INSIGNIFICANT GROWTH (A)  Final   Report Status 07/25/2015 FINAL  Final  Culture, blood (routine x 2) Call MD if unable to obtain prior to antibiotics being given     Status: None   Collection Time: 07/24/15  5:40 PM  Result Value Ref Range Status   Specimen Description BLOOD LEFT ANTECUBITAL  Final   Special Requests BOTTLES DRAWN AEROBIC AND ANAEROBIC 5CC  Final   Culture NO GROWTH 5 DAYS  Final   Report Status 07/29/2015 FINAL  Final  Culture, blood (routine x 2) Call MD if unable to obtain prior to antibiotics being given     Status: None   Collection Time:  07/24/15  5:45 PM  Result Value Ref Range Status   Specimen Description BLOOD LEFT HAND  Final   Special Requests BOTTLES DRAWN AEROBIC AND ANAEROBIC 5CC  Final   Culture NO GROWTH 5 DAYS  Final   Report Status 07/29/2015 FINAL  Final     Labs: Basic Metabolic Panel:  Recent Labs Lab 07/24/15 1330 07/25/15 0505 07/26/15 0634 07/27/15 0705 07/28/15 0647  NA 127* 131* 135 135 133*  K 3.4* 3.0* 3.9 3.5 3.6  CL 91* 94* 96* 94* 91*  CO2 26 28 31  33* 34*  GLUCOSE 138* 95 104* 100* 108*  BUN 7 9 13 10 16   CREATININE [id number] 0.59 0.55 0.44 0.55  CALCIUM 8.7*  8.0* 8.7* 8.7* 8.9  MG  --   --  1.7  --   --   PHOS  --   --  2.9  --   --    Liver Function Tests:  Recent Labs Lab 07/25/15 0505 07/27/15 0839  AST 24 29  ALT 18 20  ALKPHOS 63 67  BILITOT 0.5 0.6  PROT 5.2* 5.6*  ALBUMIN 2.5* 2.5*   No results for input(s): LIPASE, AMYLASE in the last 168 hours. No results for input(s): AMMONIA in the last 168 hours. CBC:  Recent Labs Lab 07/24/15 1330 07/25/15 0505 07/26/15 0634  WBC 6.5 4.7 5.0  NEUTROABS  --   --  2.4  HGB 10.8* 9.8* 9.5*  HCT 32.0* 29.5* 29.0*  MCV 94.1 94.9 97.0  PLT 408* 381 364   Cardiac Enzymes: No results for input(s): CKTOTAL, CKMB, CKMBINDEX, TROPONINI in the last 168 hours. BNP: BNP (last 3 results)  Recent Labs  07/24/15 1830 07/26/15 1057  BNP 343.9* 188.2*    ProBNP (last 3 results) No results for input(s): PROBNP in the last 8760 hours.  CBG:  Recent Labs Lab 07/27/15 0856 07/27/15 1134  GLUCAP 96 111*       Signed:  Louellen Molder MD.  [facility] 07/30/2015, 2:47 PM

## 2015-07-30 NOTE — Care Management Important Message (Signed)
Important Message  Patient Details  Name: Megan Munoz MRN: [id number] Date of Birth: 01/31/1933   Medicare Important Message Given:  Yes    Nathen May 07/30/2015, 12:26 PM

## 2015-07-30 NOTE — Progress Notes (Signed)
SATURATION QUALIFICATIONS: (This note is used to comply with regulatory documentation for home oxygen)  Patient Saturations on Room Air at Rest = 100%  HR 114  Patient Saturations on Room Air while Ambulating = 97%  HR 135  Patient unsteady gate using walker. Patient stated she does not use a walker at home. Patient tolerated ambulation fair. MD paged to make aware of HR.

## 2015-07-30 NOTE — Progress Notes (Signed)
Megan Munoz to be D/C'd Home per MD order.  Discussed with the patient and all questions fully answered.  VSS. MASD to buttock. Barrier cream applied.  IV catheter discontinued intact. Site without signs and symptoms of complications. Dressing and pressure applied.  An After Visit Summary was printed and given to the patient. Patient received prescription.  D/c education completed with patient/family including follow up instructions, medication list, d/c activities limitations if indicated, with other d/c instructions as indicated by MD - patient able to verbalize understanding, all questions fully answered.   Patient instructed to return to ED, call 911, or call MD for any changes in condition.   Patient to be escorted via [place], and D/C home via private auto.  L'ESPERANCE, [name] C 07/30/2015 4:17 PM

## 2015-07-30 NOTE — Care Management Note (Addendum)
Case Management Note  Patient Details  Name: Megan Munoz MRN: [id number] Date of Birth: 1932/09/01  Subjective/Objective:                 Admitted with CHF.Resides with daughter.   Action/Plan: Discharge to home with home health services(OT,PT,RN).  Expected Discharge Date:   07/30/2015           Expected Discharge Plan:  [place]  In-House Referral:     Discharge planning Services  CM Consult  Post Acute Care Choice:    Choice offered to:  Patient, Adult Children  DME Arranged:   3 IN1/BSC, ROLLING WALKER DME Agency:   [facility]  HH Arranged:  OT, PT,RN [place] Agency:  [facility]  Status of Service:  Completed     If discussed at [place] of Stay Meetings, dates discussed:  07/30/2015  Additional Comments: CM faxed demographics, hh order , h& p, and d/c summary to [facility] @ [phone].  Whitman Hero [place], [place] [phone] 07/30/2015, 9:54 AM

## 2015-07-30 NOTE — Progress Notes (Signed)
Name: Megan Munoz MRN: [id number] DOB: Jan 24, 1933    ADMISSION DATE:  07/24/2015 CONSULTATION DATE:  07/26/2015  REFERRING MD :    CHIEF COMPLAINT:  Bilateral Pleural effusions : evaluate for Thorocentesis  BRIEF PATIENT DESCRIPTION: 82 y.o. 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 She was in [place] 2 weeks ago, at which time she was diagnosed with pneumonia, admitted to the local hospital where she remained for 6 days for treatment. She was released on Cefdinir.Since D/C she has had a 20 pound weight gain, increasing SOB, orthopnea, and increasing lower extremity edema bilaterally. BNP on admission was 2954, chest x-ray showed multifocal pneumonia, with superimposed interstitial edema and moderate right, and small left pleural effusions. There was also notation of suspicious right apical mass that was not clearly visualized due to surrounding fluid.This will need outpatient follow up with CT chest in 3 months.Pt is lasix naive and is responding well to lasix. She is not yet at her dry weight which is 126 lbs per daughter.PCCM have been asked to see patient  and evaluate pleural effusions for possible thoracentesis.   SIGNIFICANT EVENTS  Recent hospitalization( 2 weeks ago) x 6 days in [place] for Pneumonia D/C'd on Cefdinir 20 Lb weight gain since hospital D/C( Per daughter dry weight is 126 lbs) Admission BNP: 2954 Admitted with multifocal pneumonia/interstitial edema/ bilateral pleural effusions CT shows  additional right apical mass that will require follow up   STUDIES: CT Chest 4/26 :  Suspected multifocal pneumonia with superimposed interstitial edema. Moderate right and small left pleural effusions. Suspected biapical pleural-parenchymal scarring, right greater than left. Underlying right apical  mass is difficult to entirely exclude given the surrounding fluid on the current study. Consider follow-up CT chest in 3 months after resolution of acute illness. 07/26/2015:Echo> LVEF 55-60%, Possible mild hypokinesis of the basal-midinferolateral myocardium. PA peak 64mmHg  SUBJECTIVE: No complaints  Filed Weights   07/28/15 0516 07/29/15 0458 07/30/15 0514  Weight: 57.289 kg (126 lb 4.8 oz) 58.3 kg (128 lb 8.5 oz) 56 kg (123 lb 7.3 oz)    VITAL SIGNS: Temp:  [97.8 F (36.6 C)-99.4 F (37.4 C)] 97.8 F (36.6 C) (05/02 0507) Pulse Rate:  [85-108] 85 (05/02 1326) Resp:  [16-22] 18 (05/02 0507) BP: (106-120)/(60-64) 120/61 mmHg (05/02 0953) SpO2:  [92 %-100 %] 94 % (05/02 0814) Weight:  [56 kg (123 lb 7.3 oz)] 56 kg (123 lb 7.3 oz) (05/02 0514)  PHYSICAL EXAMINATION: General:  Alert pleasant elderly female, wearing oxygen Neuro:Sensation intact, DTR normal. Strength 5/5 in all 4 extremities. Resting tremor. Strange affect  HEENT: [place]/AT, PERRL, no appreciable jvd Cardiovascular:  Tachycardic, irregular rhythm, no MRG,  trace edema Lungs:few scattered crackles Abdomen: No tenderness, masses, soft to palpation,BS+   Musculoskeletal: no clubbing / cyanosis. Noted arthritic joint deformity upper and lower extremities. Good ROM, no contractures. Normal muscle tone.   Skin:No rashes, lesions, ulcers, warm and dry,      Recent Labs Lab 07/26/15 0634 07/27/15 0705 07/28/15 0647  NA 135 135 133*  K 3.9 3.5 3.6  CL 96* 94* 91*  CO2 31 33* 34*  BUN 13 10 16   CREATININE 0.55 0.44 0.55  GLUCOSE 104* 100* 108*    Recent Labs Lab 07/24/15 1330 07/25/15 0505 07/26/15 0634  HGB 10.8* 9.8* 9.5*  HCT 32.0* 29.5* 29.0*  WBC  6.5 4.7 5.0  PLT 408* 381 364   Dg Chest Port 1 View  07/30/2015  CLINICAL DATA:  Hypoxia. EXAM: PORTABLE CHEST 1 VIEW COMPARISON:  07/26/2015 .  07/07/2014.  07/02/2014.  CT 07/24/2015. FINDINGS: Mediastinum and hilar structures are normal. Cardiomegaly with  normal pulmonary vascularity. Interim near complete clearing of bilateral pulmonary infiltrates. Stable biapical pleural thickening noted consistent with scarring. Small bilateral pleural effusions . IMPRESSION: 1. Interim near complete clearing of bilateral upper lobe pulmonary infiltrates. Small bilateral pleural effusion again noted. 2. Stable cardiomegaly.  No pulmonary venous congestion . Electronically Signed   By: Marcello Moores  Register   On: 07/30/2015 07:48    ASSESSMENT / PLAN:  Acute respiratory failure with hypoxia secondary to CHF exacerbation and HCAP. R>L Pleural Effusions per CT and CXR - Evaluated per Bedside US by Dr. Titus Mould 4/28 no need for thora at that time and symptoms have been subjectively improving.  R Apical Mass  Plan: Did well on ambulatory desaturation assessement > no home O2 needed. For discharge today, fine from pulmonary standpoint. Will need CT Chest Right Apical mass in 3 months, pulmonary follow up arranged. Primary team to arrange scan.  Georgann Housekeeper, AGACNP-BC [place] [facility] Pager [phone] or [phone]  07/30/2015 2:17 PM  Attending Note:  82 year old female with history of pleural effusion likely related to fluid overload. Responded to diureses. U/S revealed not enough fluid for a thora. On exam, bibasilar crackles. Patient remains on O2. I reviewed CXR myself, small effusion noted. Discussed with PCCM-NP.  Pleural effusion: - Diureses as able, may switch to PO today if going home. - Monitor periodically with CXR when symptomatic.  Hypoxemia: - Titrate O2 for sat of 88-92%. - Ambulatory desaturation prior to discharge for ?of home O2.  Apical mass: - Chest CT in 3 months with f/u with PCCM in 3 months..  Please arrange f/u with PCCM upon discharge.  PCCM will sign off, please call back if needed.  Patient seen and examined, agree with above note. I  dictated the care and orders written for this patient under my direction.  Rush Farmer, MD [phone]

## 2015-07-31 ENCOUNTER — Other Ambulatory Visit: Payer: Self-pay

## 2015-07-31 NOTE — Patient Outreach (Signed)
[facility]) Care Management  07/31/2015  Megan Munoz 07/12/1932 [id number]  Transition of care. 82 year old with admissiton 4/20-5/2 with pneumonia and heart failure. RNCM called to complete transition of care call. No answer. HIPPA compliant message left.  Plan: attempt to contact member tomorrow.  Thea Silversmith, RN, MSN, [place] Coordinator Cell: [phone]

## 2015-08-01 ENCOUNTER — Other Ambulatory Visit: Payer: Self-pay | Admitting: *Deleted

## 2015-08-01 DIAGNOSIS — Z9181 History of falling: Secondary | ICD-10-CM | POA: Diagnosis not present

## 2015-08-01 DIAGNOSIS — R918 Other nonspecific abnormal finding of lung field: Secondary | ICD-10-CM | POA: Diagnosis not present

## 2015-08-01 DIAGNOSIS — E785 Hyperlipidemia, unspecified: Secondary | ICD-10-CM | POA: Diagnosis not present

## 2015-08-01 DIAGNOSIS — F419 Anxiety disorder, unspecified: Secondary | ICD-10-CM | POA: Diagnosis not present

## 2015-08-01 DIAGNOSIS — E440 Moderate protein-calorie malnutrition: Secondary | ICD-10-CM | POA: Diagnosis not present

## 2015-08-01 DIAGNOSIS — I5031 Acute diastolic (congestive) heart failure: Secondary | ICD-10-CM | POA: Diagnosis not present

## 2015-08-01 DIAGNOSIS — I110 Hypertensive heart disease with heart failure: Secondary | ICD-10-CM | POA: Diagnosis not present

## 2015-08-01 DIAGNOSIS — F0390 Unspecified dementia without behavioral disturbance: Secondary | ICD-10-CM | POA: Diagnosis not present

## 2015-08-01 DIAGNOSIS — Z7901 Long term (current) use of anticoagulants: Secondary | ICD-10-CM | POA: Diagnosis not present

## 2015-08-01 DIAGNOSIS — Z8673 Personal history of transient ischemic attack (TIA), and cerebral infarction without residual deficits: Secondary | ICD-10-CM | POA: Diagnosis not present

## 2015-08-01 DIAGNOSIS — M8580 Other specified disorders of bone density and structure, unspecified site: Secondary | ICD-10-CM | POA: Diagnosis not present

## 2015-08-01 DIAGNOSIS — Z8701 Personal history of pneumonia (recurrent): Secondary | ICD-10-CM | POA: Diagnosis not present

## 2015-08-01 DIAGNOSIS — I482 Chronic atrial fibrillation: Secondary | ICD-10-CM | POA: Diagnosis not present

## 2015-08-01 DIAGNOSIS — G250 Essential tremor: Secondary | ICD-10-CM | POA: Diagnosis not present

## 2015-08-01 NOTE — Patient Outreach (Signed)
Member recently discharged from hospital.  Initial attempt made to contact member for transition of care by assigned care manager, J. Juleen China, was unsuccessful.  Call placed again today to initiate program, no answer.  HIPPA compliant voice message left.  Will await call back.  Will make 3rd attempt to contact tomorrow, if no answer, will send outreach letter.  Valente David, BSN, [place] Management  [facility] Care Manager [phone]

## 2015-08-02 ENCOUNTER — Other Ambulatory Visit: Payer: Self-pay | Admitting: *Deleted

## 2015-08-02 NOTE — Patient Outreach (Signed)
Call placed to member again to start transition of care program.  Daughter answers phone, verified identity.  [facility] care management services explained, she agrees to proceed with involvement.  Daughter state that the member is doing "much better" since discharge.  She report that the member weighed 146 pounds on admission and is now down to 122.8 pounds, and her shortness of breath has decreased.  Daughter state she weighs member daily, recording weights for trends.  Member has follow up appointment with cardiology next week.  She is active with home health through [facility], receiving nursing, physical therapy and occupational therapy.  Daughter is caregiver, assisting with medication administration and transportation to appointments.  Patient was recently discharged from hospital and all medications have been reviewed.    Daughter denies any urgent concerns at this time.  Made aware that assigned care manager will follow up next week.  Valente David, BSN, [place] Management  [facility] Care Manager [phone]

## 2015-08-05 ENCOUNTER — Encounter: Payer: Self-pay | Admitting: Cardiology

## 2015-08-05 ENCOUNTER — Ambulatory Visit (INDEPENDENT_AMBULATORY_CARE_PROVIDER_SITE_OTHER): Payer: Medicare Other | Admitting: Cardiology

## 2015-08-05 VITALS — BP 120/78 | HR 76 | Ht 66.0 in | Wt 126.0 lb

## 2015-08-05 DIAGNOSIS — I482 Chronic atrial fibrillation: Secondary | ICD-10-CM

## 2015-08-05 DIAGNOSIS — R600 Localized edema: Secondary | ICD-10-CM | POA: Insufficient documentation

## 2015-08-05 DIAGNOSIS — I5031 Acute diastolic (congestive) heart failure: Secondary | ICD-10-CM

## 2015-08-05 DIAGNOSIS — I10 Essential (primary) hypertension: Secondary | ICD-10-CM | POA: Diagnosis not present

## 2015-08-05 DIAGNOSIS — I4821 Permanent atrial fibrillation: Secondary | ICD-10-CM

## 2015-08-05 MED ORDER — FUROSEMIDE 20 MG PO TABS: 20.0000 mg | ORAL_TABLET | Freq: Every day | ORAL | Status: DC | PRN

## 2015-08-05 MED ORDER — DILTIAZEM HCL ER COATED BEADS 120 MG PO CP24: 360.0000 mg | ORAL_CAPSULE | Freq: Every day | ORAL | Status: DC

## 2015-08-05 MED ORDER — CARVEDILOL 12.5 MG PO TABS: 12.5000 mg | ORAL_TABLET | Freq: Two times a day (BID) | ORAL | Status: DC

## 2015-08-05 NOTE — Assessment & Plan Note (Signed)
Interestingly, we had decreased her beta blocker dose as well as stopping her ACE inhibitor and Imdur because of fatigue and near syncope. She also has significant lethargy of the time. Now she is on moderate dose of diltiazem as well as moderate dose of carvedilol and pressures are relatively well-controlled. I don't think we have any room for afterload reduction due to the use of 2 rate control agents.

## 2015-08-05 NOTE — Progress Notes (Signed)
PCP: [name], MD  Clinic Note: Chief Complaint  Patient presents with  . Follow-up    Hospital in [place] and here, pitting edema both ankles, DOE,   . Atrial Fibrillation  . Congestive Heart Failure    Diastolic    HPI: Megan Munoz is a 82 y.o. female with a PMH below who presents today for Hospital follow-up while in [place]. When I last saw her was August 2016 down after an episode of near syncope and was found to have A. fib. She was also quite hypotensive. She was progressively followed by a cardiologist in which Dr. Janene Madeira with a diagnosis of atrial fibrillation treated with atenolol and Xarelto.  Recent Hospitalizations:  Apparently hospitalized in [place] for PNA (Afib RVR).Was in the hospital for 6 nights from April 10-16. Weight was up 20 pounds after that hospital stay ; atenolol was stopped, she was placed on carvedilol and diltiazem. Was not placed on diuretic.  Readmitted to [facility] for volume overload =- BNP >2000; rapid IV diuresis of almost 20 lb while inpt. converted to oral. Evaluated for possible Thoracentesis =- not done; Chest CT suggested possible right apical mass.  Studies Reviewed:   CT Chest 4/26 : Suspected multifocal pneumonia with superimposed interstitial edema. Moderate right and small left pleural effusions. Suspected biapical pleural-parenchymal scarring, right greater than left. Underlying right apical mass is difficult to entirely exclude given the surrounding fluid on the current study. Consider follow-up CT chest in 3 months after resolution of acute illness.  07/26/2015:Echo> LVEF 55-60%, Possible mild hypokinesis of the basal-midinferolateral myocardium. PA peak 61mmHg  Interval History: Megan Munoz is doing relatively well now. She is pretty much back to her baseline weight. She has not been taking Lasix daily, but is only been using as needed. She has yet to start back to physical therapy and occupational therapy that was ordered when  in the hospital. As result she has not been very mobile. She has had some mild swelling in her feet most elevated today. This is usually only dependent and goes away with her feet up. The edema doesn't go up beyond her ankles however. The edema is not associated with any PND or orthopnea. She still remains relatively oblivious of her atrial fibrillation with no rapid irregular heartbeat sensations.  No chest pain or shortness of breath with rest or exertion.  No dizziness, weakness or syncope/near syncope. No TIA/amaurosis fugax symptoms. No melena, hematochezia, hematuria, or epstaxis. No claudication.  ROS: A comprehensive was performed. Review of Systems  Constitutional: Negative for weight loss and malaise/fatigue.  HENT: Negative for congestion and nosebleeds.   Respiratory: Negative for shortness of breath and wheezing.        Cough is actually improving from her pneumonia episode.  Gastrointestinal: Negative for blood in stool and melena.  Genitourinary: Negative for hematuria.  Musculoskeletal: Negative for joint pain and falls.  Neurological: Negative for dizziness, loss of consciousness and headaches.  Endo/Heme/Allergies: Does not bruise/bleed easily.  Psychiatric/Behavioral: Positive for memory loss (Slowly worsening dementia. Poor executive function. Poor historian). Negative for depression. The patient has insomnia (Still has a hard time staying asleep.). The patient is not nervous/anxious.   All other systems reviewed and are negative.    Past Medical History  Diagnosis Date  . Anxiety   . Cerebral infarction [facility]) 10/2008    MRI Brain 05/2014 for near syncope revealed: No acure infarct.  Moderate to large remote left frontal lobe infarct with encephalomalacia and dilation of the frontal horn  of the left lateral ventricle.   Mild small vessel disease type changes.  No intracranial hemorrhage.  Global atrophy.   . Atrial fibrillation, permanent ([place]) 03/2013    Previously on  Atenolol for Rate - now on combination diltiazem and carvedilol for rate Control;  & Xarelto for AC. CHADS2VASC2 = 7.  . Hypertension   . Tinea unguium   . Hyperlipemia   . Tremor   . Coronary atherosclerosis due to calcified coronary lesion April 2017    Noted on CT chest  . Atherosclerosis of aortic arch [facility]) April 2017    Noted on CT chest, calcified aortic atherosclerosis at the arch.    Past Surgical History  Procedure Laterality Date  . Carpal tunnel release Left   . Total abdominal hysterectomy    . Foot surgery Right   . Bladder surgery    . Hernia repair    . Nasal sinus surgery      Prior to Admission medications   Medication Sig Start Date End Date Taking? Authorizing Provider  albuterol (ACCUNEB) 0.63 MG/3ML nebulizer solution Take 3 mLs by nebulization every 6 (six) hours as needed for wheezing or shortness of breath. Reported on 08/02/2015 07/03/14  Yes Historical Provider, MD  B Complex Vitamins (VITAMIN B COMPLEX PO) Take 1 tablet by mouth daily.    Yes Historical Provider, MD  Calcium Carb-Cholecalciferol (CALCIUM + D3) 600-200 MG-UNIT TABS Take 1-2 tablets by mouth 2 (two) times daily. 2 tablets in the morning and 1 tablet in the evening.   Yes Historical Provider, MD  carvedilol (COREG) 12.5 MG tablet Take 1 tablet (12.5 mg total) by mouth 2 (two) times daily with a meal. 07/30/15  Yes Nishant Dhungel, MD  diltiazem (DILTIAZEM CD) 120 MG 24 hr capsule Take 360 mg by mouth daily.   Yes Historical Provider, MD  diphenhydrAMINE (SOMINEX) 25 MG tablet Take 50 mg by mouth at bedtime.    Yes Historical Provider, MD  feeding supplement, ENSURE ENLIVE, (ENSURE ENLIVE) LIQD Take 237 mLs by mouth 3 (three) times daily between meals. 07/30/15  Yes Nishant Dhungel, MD  ferrous sulfate 325 (65 FE) MG tablet Take 325 mg by mouth daily with breakfast.   Yes Historical Provider, MD  Flaxseed, Linseed, (EQL FLAX SEED OIL) 1000 MG CAPS Take 1,000 mg by mouth daily.    Yes Historical  Provider, MD  FLUoxetine (PROZAC) 20 MG capsule Take 20 mg by mouth daily.   Yes Historical Provider, MD  furosemide (LASIX) 20 MG tablet Take 1 tablet (20 mg total) by mouth daily as needed for fluid or edema. 07/30/15  Yes Nishant Dhungel, MD  loratadine (CLARITIN) 10 MG tablet Take 10 mg by mouth daily.   Yes Historical Provider, MD  Melatonin 10 MG TABS Take 10 mg by mouth at bedtime.    Yes Historical Provider, MD  memantine (NAMENDA) 10 MG tablet Take 1 tablet (10 mg total) by mouth 2 (two) times daily. 05/01/15  Yes Marcial Pacas, MD  Multiple Vitamin (MULTIVITAMIN) capsule Take 1 capsule by mouth daily.   Yes Historical Provider, MD  primidone (MYSOLINE) 250 MG tablet Take 125 mg by mouth 2 (two) times daily.    Yes Historical Provider, MD  rivaroxaban (XARELTO) 20 MG TABS tablet Take 20 mg by mouth daily with supper.   Yes Historical Provider, MD  simvastatin (ZOCOR) 40 MG tablet Take 40 mg by mouth daily.   Yes Historical Provider, MD  VENTOLIN HFA 108 (90 Base) MCG/ACT inhaler Inhale  2 puffs into the lungs daily as needed. 07/15/15  Yes Historical Provider, MD    Allergies  Allergen Reactions  . Tetanus Toxoids Other (See Comments)    Told never to take     Social History   Social History  . Marital Status: Widowed    Spouse Name: N/A  . Number of Children: 1  . Years of Education: 15   Occupational History  . Retired    Social History Main Topics  . Smoking status: Former Research scientist (life sciences)  . Smokeless tobacco: None  . Alcohol Use: No  . Drug Use: No  . Sexual Activity: Not Asked   Other Topics Concern  . None   Social History Narrative   Widow.  Lives with her daughter & her family.  (Son-in-law & 5 Grandsons).   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   2-4 cups caffeine/day   1-2 glassess of wine/beer  Per week.  Never smoked.   Family History family history includes Asthma in her daughter; Cancer in her maternal grandmother; Emphysema in her  father; Osteoporosis in her mother; Stroke in her brother.  Wt Readings from Last 3 Encounters:  08/05/15 126 lb (57.153 kg)  07/30/15 123 lb 7.3 oz (56 kg)  12/11/14 129 lb (58.514 kg)  - back to baseline wgt.  PHYSICAL EXAM BP 120/78 mmHg  Pulse 76  Ht 5\' 6"  (1.676 m)  Wt 126 lb (57.153 kg)  BMI 20.35 kg/m2 General appearance: alert, cooperative, appears stated age, no distress and Pleasant mood and affect HEENT: [place]/AT, EOMI, MMM, anicteric sclera Neck: no adenopathy, no carotid bruit, no JVD and supple, symmetrical, trachea midline Lungs: clear to auscultation bilaterally and normal percussion bilaterally Heart: Irregularly irregular, no M./R./G., nondisplaced PMI. Abdomen: soft, non-tender; bowel sounds normal; no masses, no organomegaly Extremities: extremities normal, atraumatic, no cyanosis or edema Pulses: 2+ and symmetric Skin: Somewhat wrinkled patulous skin bilateral lower extremities -- daughter notes that this is was noted since the edema resolved. She has violaceous changes in her feet from dependent edema. This goes away with her legs up. Mild venous stasis changes in bilateral lower extremities with telangiectasias. Neurologic: Mental status: Alert, oriented, thought content appropriate Cranial nerves: normal    Adult ECG Report  Rate: 76 ;  Rhythm: atrial fibrillation and Poor R-wave progression suggesting possible anterolateral infarct, age undetermined.;   Narrative Interpretation: Relatively stable EKG. Only difference from last EKG is that the axis is more rightward now. Suspect limb leads difference.   Other studies Reviewed: Additional studies/ records that were reviewed today include:  Recent Labs:  Labs mostly followed by PCP Lab Results  Component Value Date   CREATININE 0.55 07/28/2015   No results found for: CHOL, HDL, LDLCALC, LDLDIRECT, TRIG, CHOLHDL   ASSESSMENT / PLAN: Problem List Items Addressed This Visit    Permanent atrial fibrillation  ([place]) - Primary (Chronic)    Her atrial fibrillation responded with RVR when she had her pneumonia. Change from atenolol over to carvedilol and diltiazem. Rate continues to be controlled. We will need to watch for signs of bradycardia, I think his diltiazem dose may be a little bit high for her. This patients CHA2DS2-VASc Score and unadjusted Ischemic Stroke Rate (% per year) is equal to 11.2 % stroke rate/year from a score of 7 Remains on Xarelto for anticoagulation.      Relevant Medications   diltiazem (DILTIAZEM CD) 120 MG 24 hr capsule   carvedilol (COREG) 12.5  MG tablet   furosemide (LASIX) 20 MG tablet   Other Relevant Orders   EKG 12-Lead   Essential hypertension (Chronic)    Interestingly, we had decreased her beta blocker dose as well as stopping her ACE inhibitor and Imdur because of fatigue and near syncope. She also has significant lethargy of the time. Now she is on moderate dose of diltiazem as well as moderate dose of carvedilol and pressures are relatively well-controlled. I don't think we have any room for afterload reduction due to the use of 2 rate control agents.      Relevant Medications   diltiazem (DILTIAZEM CD) 120 MG 24 hr capsule   carvedilol (COREG) 12.5 MG tablet   furosemide (LASIX) 20 MG tablet   Other Relevant Orders   EKG 12-Lead   Bilateral lower extremity edema    Small amount of edema is pretty much chronic or her, I think some of this may also be contributed to by the diltiazem however with her recent heart failure exacerbation, I think she probably be best to stay on low dose of diuretic dosages 20 mg 2-3 times a week. I would love for her to go to wear support stockings, but she has a hard time getting them on. Maybe if we can have her use the machine 100 feet for long time, otherwise keep her feet elevated during the day. We also talked about doing foot exercises to help with venous stasis.      Acute diastolic CHF (congestive heart failure) (HCC)      This was in the setting of known probably diastolic dysfunction and RVR with pneumonia. She was given plenty of IV fluids during her hospital stay and was 20 pounds above her dry weight on discharge. As result, she eventually end up having heart failure symptoms. Her edema is pretty much gone just has mild lower extremity venous stasis looking edema. She is only taking the Lasix when necessary. I think we can probably have her simply take it 2-3 days a week standing and then additional doses when necessary. She is on carvedilol which is probably better for diastolic heart failure.  Unfortunately, with her atrial fibrillation, we cannot accurately assess her diastolic parameters on echocardiogram.      Relevant Medications   diltiazem (DILTIAZEM CD) 120 MG 24 hr capsule   carvedilol (COREG) 12.5 MG tablet   furosemide (LASIX) 20 MG tablet   Other Relevant Orders   EKG 12-Lead      Current medicines are reviewed at length with the patient today. (+/- concerns) None The following changes have been made:  START TAKING LASIX ( FUROSEMIDE) ON MONDAYS AND TUESDAYS EVERY WEEK- ALL THE OTHER TIMES TAKE IF NEEDED FOR SWELLING ( EDEMA)    NO OTHER CHANGES AT PRESENT TIME   Your physician wants you to follow-up in 6 MONTHS WITH DR Ellyn Hack  Studies Ordered:   Orders Placed This Encounter  Procedures  . EKG 12-Lead      Leonie Man, M.D., M.S. Interventional Cardiologist   Pager # [phone] Phone # [phone] [address]. [place], [place] [address]

## 2015-08-05 NOTE — Patient Instructions (Signed)
START TAKING LASIX ( FUROSEMIDE) ON MONDAYS AND TUESDAYS EVERY WEEK- ALL THE OTHER TIMES TAKE IF NEEDED FOR SWELLING ( EDEMA)    NO OTHER CHANGES AT PRESENT TIME   Your physician wants you to follow-up in 6 MONTHS WITH DR Ellyn Hack  You will receive a reminder letter in the mail two months in advance. If you don't receive a letter, please call our office to schedule the follow-up appointment.   If you need a refill on your cardiac medications before your next appointment, please call your pharmacy.

## 2015-08-05 NOTE — Assessment & Plan Note (Signed)
Small amount of edema is pretty much chronic or her, I think some of this may also be contributed to by the diltiazem however with her recent heart failure exacerbation, I think she probably be best to stay on low dose of diuretic dosages 20 mg 2-3 times a week. I would love for her to go to wear support stockings, but she has a hard time getting them on. Maybe if we can have her use the machine 100 feet for long time, otherwise keep her feet elevated during the day. We also talked about doing foot exercises to help with venous stasis.

## 2015-08-05 NOTE — Assessment & Plan Note (Addendum)
Her atrial fibrillation responded with RVR when she had her pneumonia. Change from atenolol over to carvedilol and diltiazem. Rate continues to be controlled. We will need to watch for signs of bradycardia, I think his diltiazem dose may be a little bit high for her. This patients CHA2DS2-VASc Score and unadjusted Ischemic Stroke Rate (% per year) is equal to 11.2 % stroke rate/year from a score of 7 Remains on Xarelto for anticoagulation.

## 2015-08-05 NOTE — Assessment & Plan Note (Signed)
This was in the setting of known probably diastolic dysfunction and RVR with pneumonia. She was given plenty of IV fluids during her hospital stay and was 20 pounds above her dry weight on discharge. As result, she eventually end up having heart failure symptoms. Her edema is pretty much gone just has mild lower extremity venous stasis looking edema. She is only taking the Lasix when necessary. I think we can probably have her simply take it 2-3 days a week standing and then additional doses when necessary. She is on carvedilol which is probably better for diastolic heart failure.  Unfortunately, with her atrial fibrillation, we cannot accurately assess her diastolic parameters on echocardiogram.

## 2015-08-06 DIAGNOSIS — R918 Other nonspecific abnormal finding of lung field: Secondary | ICD-10-CM | POA: Diagnosis not present

## 2015-08-06 DIAGNOSIS — E440 Moderate protein-calorie malnutrition: Secondary | ICD-10-CM | POA: Diagnosis not present

## 2015-08-06 DIAGNOSIS — I509 Heart failure, unspecified: Secondary | ICD-10-CM | POA: Diagnosis not present

## 2015-08-06 DIAGNOSIS — I5031 Acute diastolic (congestive) heart failure: Secondary | ICD-10-CM | POA: Diagnosis not present

## 2015-08-06 DIAGNOSIS — I10 Essential (primary) hypertension: Secondary | ICD-10-CM | POA: Diagnosis not present

## 2015-08-06 DIAGNOSIS — I482 Chronic atrial fibrillation: Secondary | ICD-10-CM | POA: Diagnosis not present

## 2015-08-06 DIAGNOSIS — I4891 Unspecified atrial fibrillation: Secondary | ICD-10-CM | POA: Diagnosis not present

## 2015-08-06 DIAGNOSIS — I110 Hypertensive heart disease with heart failure: Secondary | ICD-10-CM | POA: Diagnosis not present

## 2015-08-06 DIAGNOSIS — E785 Hyperlipidemia, unspecified: Secondary | ICD-10-CM | POA: Diagnosis not present

## 2015-08-07 DIAGNOSIS — I110 Hypertensive heart disease with heart failure: Secondary | ICD-10-CM | POA: Diagnosis not present

## 2015-08-07 DIAGNOSIS — I482 Chronic atrial fibrillation: Secondary | ICD-10-CM | POA: Diagnosis not present

## 2015-08-07 DIAGNOSIS — E785 Hyperlipidemia, unspecified: Secondary | ICD-10-CM | POA: Diagnosis not present

## 2015-08-07 DIAGNOSIS — E440 Moderate protein-calorie malnutrition: Secondary | ICD-10-CM | POA: Diagnosis not present

## 2015-08-07 DIAGNOSIS — I5031 Acute diastolic (congestive) heart failure: Secondary | ICD-10-CM | POA: Diagnosis not present

## 2015-08-07 DIAGNOSIS — R918 Other nonspecific abnormal finding of lung field: Secondary | ICD-10-CM | POA: Diagnosis not present

## 2015-08-09 ENCOUNTER — Other Ambulatory Visit: Payer: Self-pay

## 2015-08-09 DIAGNOSIS — E440 Moderate protein-calorie malnutrition: Secondary | ICD-10-CM | POA: Diagnosis not present

## 2015-08-09 DIAGNOSIS — I482 Chronic atrial fibrillation: Secondary | ICD-10-CM | POA: Diagnosis not present

## 2015-08-09 DIAGNOSIS — R918 Other nonspecific abnormal finding of lung field: Secondary | ICD-10-CM | POA: Diagnosis not present

## 2015-08-09 DIAGNOSIS — I5031 Acute diastolic (congestive) heart failure: Secondary | ICD-10-CM | POA: Diagnosis not present

## 2015-08-09 DIAGNOSIS — I110 Hypertensive heart disease with heart failure: Secondary | ICD-10-CM | POA: Diagnosis not present

## 2015-08-09 DIAGNOSIS — E785 Hyperlipidemia, unspecified: Secondary | ICD-10-CM | POA: Diagnosis not present

## 2015-08-09 NOTE — Patient Outreach (Addendum)
[facility]) Care Management  08/09/2015  Naylea Mayard [birth year] [id number]  Subjective: "She is doing remarkably well".  RNCM called for transition of care/spoke with member's daughter, Daine Gip, who reports member is doing well. Mrs. Ebony Hail states member has been to see both her primary care physician and cardiologist. Home health is still involved. Mrs. Ebony Hail reports member weight is down, adding her baseline is 126 pounds. Mrs. Ebony Hail reports she is out and is not a good time to complete assessment questions. Mrs. Ebony Hail reports she is monitoring member to ensure that she does not go to far below her baseline. RNCM attempted to schedule a home visit for next week. Mrs. Ebony Hail request RNCM call at the beginning of the week regarding scheduling home visit.  Plan: continue weekly engagements.  Thea Silversmith, RN, MSN, [place] Coordinator Cell: [phone]

## 2015-08-12 DIAGNOSIS — R918 Other nonspecific abnormal finding of lung field: Secondary | ICD-10-CM | POA: Diagnosis not present

## 2015-08-12 DIAGNOSIS — E785 Hyperlipidemia, unspecified: Secondary | ICD-10-CM | POA: Diagnosis not present

## 2015-08-12 DIAGNOSIS — E440 Moderate protein-calorie malnutrition: Secondary | ICD-10-CM | POA: Diagnosis not present

## 2015-08-12 DIAGNOSIS — I5031 Acute diastolic (congestive) heart failure: Secondary | ICD-10-CM | POA: Diagnosis not present

## 2015-08-12 DIAGNOSIS — I482 Chronic atrial fibrillation: Secondary | ICD-10-CM | POA: Diagnosis not present

## 2015-08-12 DIAGNOSIS — I110 Hypertensive heart disease with heart failure: Secondary | ICD-10-CM | POA: Diagnosis not present

## 2015-08-13 ENCOUNTER — Other Ambulatory Visit: Payer: Self-pay

## 2015-08-13 DIAGNOSIS — E785 Hyperlipidemia, unspecified: Secondary | ICD-10-CM | POA: Diagnosis not present

## 2015-08-13 DIAGNOSIS — I482 Chronic atrial fibrillation: Secondary | ICD-10-CM | POA: Diagnosis not present

## 2015-08-13 DIAGNOSIS — R918 Other nonspecific abnormal finding of lung field: Secondary | ICD-10-CM | POA: Diagnosis not present

## 2015-08-13 DIAGNOSIS — I110 Hypertensive heart disease with heart failure: Secondary | ICD-10-CM | POA: Diagnosis not present

## 2015-08-13 DIAGNOSIS — I5031 Acute diastolic (congestive) heart failure: Secondary | ICD-10-CM | POA: Diagnosis not present

## 2015-08-13 DIAGNOSIS — E440 Moderate protein-calorie malnutrition: Secondary | ICD-10-CM | POA: Diagnosis not present

## 2015-08-13 NOTE — Patient Outreach (Signed)
[facility]) Care Management  08/13/2015  Megan Munoz [birth year] [id number]  RNCM called to arrange home visit/complete transition of care. No answer. HIPPA compliant message left.   Plan: await return call and follow up if no return call.  Thea Silversmith, RN, MSN, [place] Coordinator Cell: [phone]

## 2015-08-15 DIAGNOSIS — E440 Moderate protein-calorie malnutrition: Secondary | ICD-10-CM | POA: Diagnosis not present

## 2015-08-15 DIAGNOSIS — E785 Hyperlipidemia, unspecified: Secondary | ICD-10-CM | POA: Diagnosis not present

## 2015-08-15 DIAGNOSIS — I110 Hypertensive heart disease with heart failure: Secondary | ICD-10-CM | POA: Diagnosis not present

## 2015-08-15 DIAGNOSIS — R918 Other nonspecific abnormal finding of lung field: Secondary | ICD-10-CM | POA: Diagnosis not present

## 2015-08-15 DIAGNOSIS — I5031 Acute diastolic (congestive) heart failure: Secondary | ICD-10-CM | POA: Diagnosis not present

## 2015-08-15 DIAGNOSIS — I482 Chronic atrial fibrillation: Secondary | ICD-10-CM | POA: Diagnosis not present

## 2015-08-16 DIAGNOSIS — I5031 Acute diastolic (congestive) heart failure: Secondary | ICD-10-CM | POA: Diagnosis not present

## 2015-08-16 DIAGNOSIS — E785 Hyperlipidemia, unspecified: Secondary | ICD-10-CM | POA: Diagnosis not present

## 2015-08-16 DIAGNOSIS — E440 Moderate protein-calorie malnutrition: Secondary | ICD-10-CM | POA: Diagnosis not present

## 2015-08-16 DIAGNOSIS — I482 Chronic atrial fibrillation: Secondary | ICD-10-CM | POA: Diagnosis not present

## 2015-08-16 DIAGNOSIS — I110 Hypertensive heart disease with heart failure: Secondary | ICD-10-CM | POA: Diagnosis not present

## 2015-08-16 DIAGNOSIS — R918 Other nonspecific abnormal finding of lung field: Secondary | ICD-10-CM | POA: Diagnosis not present

## 2015-08-19 ENCOUNTER — Other Ambulatory Visit: Payer: Self-pay

## 2015-08-19 DIAGNOSIS — I482 Chronic atrial fibrillation: Secondary | ICD-10-CM | POA: Diagnosis not present

## 2015-08-19 DIAGNOSIS — E440 Moderate protein-calorie malnutrition: Secondary | ICD-10-CM | POA: Diagnosis not present

## 2015-08-19 DIAGNOSIS — I110 Hypertensive heart disease with heart failure: Secondary | ICD-10-CM | POA: Diagnosis not present

## 2015-08-19 DIAGNOSIS — I5031 Acute diastolic (congestive) heart failure: Secondary | ICD-10-CM | POA: Diagnosis not present

## 2015-08-19 DIAGNOSIS — E785 Hyperlipidemia, unspecified: Secondary | ICD-10-CM | POA: Diagnosis not present

## 2015-08-19 DIAGNOSIS — R918 Other nonspecific abnormal finding of lung field: Secondary | ICD-10-CM | POA: Diagnosis not present

## 2015-08-19 NOTE — Patient Outreach (Addendum)
[facility] [facility]) Care Management  08/19/2015  Keeley Szydlo 1933/02/01 [id number]  Subjective: "She is doing really well"  82 year old admitted 4/20-5/2 with community acquired pneumonia, respiratory failure and heart failure, RNCM called regarding transition of care and to schedule home visit. Spoke with member daughter(contact person and caregiver), Daine Gip. Two patient identifiers confirmed. Ms. Ebony Hail reports member is doing well as far as her heart failure and respiratory issues, but states this is not a good time to talk.  Plan: home visit scheduled.  Thea Silversmith, RN, MSN, [place] Coordinator Cell: [phone]

## 2015-08-20 DIAGNOSIS — R918 Other nonspecific abnormal finding of lung field: Secondary | ICD-10-CM | POA: Diagnosis not present

## 2015-08-20 DIAGNOSIS — I110 Hypertensive heart disease with heart failure: Secondary | ICD-10-CM | POA: Diagnosis not present

## 2015-08-20 DIAGNOSIS — I5031 Acute diastolic (congestive) heart failure: Secondary | ICD-10-CM | POA: Diagnosis not present

## 2015-08-20 DIAGNOSIS — I482 Chronic atrial fibrillation: Secondary | ICD-10-CM | POA: Diagnosis not present

## 2015-08-20 DIAGNOSIS — E440 Moderate protein-calorie malnutrition: Secondary | ICD-10-CM | POA: Diagnosis not present

## 2015-08-20 DIAGNOSIS — E785 Hyperlipidemia, unspecified: Secondary | ICD-10-CM | POA: Diagnosis not present

## 2015-08-21 ENCOUNTER — Other Ambulatory Visit: Payer: Self-pay

## 2015-08-22 NOTE — Patient Outreach (Signed)
[facility]) Care Management  [place]  08/22/2015   Megan Munoz [birth year] [id number]  Subjective: Member reports feeling better since discharge. Member denies shortness of breath.  Objective: BP 106/60 mmHg  Pulse 86  Resp 20  Ht 1.702 m (_0 )  Wt 123 lb 12.8 oz (56.155 kg)  BMI 19.39 kg/m2  SpO2 98%, lungs clear, heart rate irregular   Encounter Medications:  Outpatient Encounter Prescriptions as of 08/21/2015  Medication Sig Note  . B Complex Vitamins (VITAMIN B COMPLEX PO) Take 1 tablet by mouth daily.    . Calcium Carb-Cholecalciferol (CALCIUM + D3) 600-200 MG-UNIT TABS Take 1-2 tablets by mouth 2 (two) times daily. 2 tablets in the morning and 1 tablet in the evening.   . carvedilol (COREG) 12.5 MG tablet Take 1 tablet (12.5 mg total) by mouth 2 (two) times daily with a meal.   . diltiazem (DILTIAZEM CD) 120 MG 24 hr capsule Take 3 capsules (360 mg total) by mouth daily.   . diphenhydrAMINE (SOMINEX) 25 MG tablet Take 50 mg by mouth at bedtime.    . feeding supplement, ENSURE ENLIVE, (ENSURE ENLIVE) LIQD Take 237 mLs by mouth 3 (three) times daily between meals.   . ferrous sulfate 325 (65 FE) MG tablet Take 325 mg by mouth daily with breakfast.   . Flaxseed, Linseed, (EQL FLAX SEED OIL) 1000 MG CAPS Take 1,000 mg by mouth daily.  08/21/2015: Per bottle 1438m per tablet.  .Marland KitchenFLUoxetine (PROZAC) 20 MG capsule Take 20 mg by mouth daily.   . furosemide (LASIX) 20 MG tablet Take 1 tablet (20 mg total) by mouth daily as needed for fluid or edema. 08/21/2015: Takes 218mon Monday and Thursday. Takes additional 2069mn other days as needed for edema.    . lMarland Kitchenratadine (CLARITIN) 10 MG tablet Take 10 mg by mouth daily.   . Melatonin 10 MG TABS Take 10 mg by mouth at bedtime.    . memantine (NAMENDA) 10 MG tablet Take 1 tablet (10 mg total) by mouth 2 (two) times daily.   . Multiple Vitamin (MULTIVITAMIN) capsule Take 1 capsule by mouth daily.   . primidone  (MYSOLINE) 250 MG tablet Take 125 mg by mouth 2 (two) times daily.    . rivaroxaban (XARELTO) 20 MG TABS tablet Take 20 mg by mouth daily with supper.   . simvastatin (ZOCOR) 40 MG tablet Take 40 mg by mouth daily.   . aMarland Kitchenbuterol (ACCUNEB) 0.63 MG/3ML nebulizer solution Take 3 mLs by nebulization every 6 (six) hours as needed for wheezing or shortness of breath. Reported on 08/21/2015   . VENTOLIN HFA 108 (90 Base) MCG/ACT inhaler Inhale 2 puffs into the lungs daily as needed. Reported on 08/21/2015    No facility-administered encounter medications on file as of 08/21/2015.    Functional Status:  In your present state of health, do you have any difficulty performing the following activities: 08/21/2015 07/27/2015  Hearing? N -  Vision? N -  Difficulty concentrating or making decisions? Y -  Walking or climbing stairs? Y -  Dressing or bathing? Y -  Doing errands, shopping? Y N  Preparing Food and eating ? Y -  Using the Toilet? N -  In the past six months, have you accidently leaked urine? N -  Do you have problems with loss of bowel control? N -  Managing your Medications? Y -  Managing your Finances? Y -  Housekeeping or managing your Housekeeping? Y -  Fall/Depression Screening: PHQ 2/9 Scores 08/21/2015  PHQ - 2 Score 0   Fall Risk  08/21/2015  Falls in the past year? No    Assessment: 82 year old with recent admission for heart failure. Per daughter-Member was vacationing in [place] and was admitted for pneumonia followed by admission for heart failure. Active with [facility]. Member reports feeling better.  Member denies any concerns at this time.  Member's daughter requesting community Senior resources regarding available outside activities.  Plan: [facility] social work referral. Continue weekly transition of care calls. [facility] CM Care Plan Problem One        Most Recent Value   Care Plan Problem One  Recent Hospitalization   Role Documenting the Problem One   Care Management Coordinator   Care Plan for Problem One  Active   [facility] Long Term Goal (31-90 days)  Member will not be readmitted to hospital within the next 31 days   [facility] Long Term Goal Start Date  08/02/15   Interventions for Problem One Long Term Goal  reinforced importance of  monitoring weights, reinforcced heart failure zone tool, reinforced prn lasix dose per cardiologist instructions.   [facility] CM Short Term Goal #1 (0-30 days)  Member will have follow up appointment within the next 2 weeks   [facility] CM Short Term Goal #1 Start Date  08/02/15   [facility] CM Short Term Goal #1 Met Date  08/09/15   [facility] CM Short Term Goal #2 (0-30 days)  Member/family will report taking all medications as prescribed over the next 4 weeks   [facility] CM Short Term Goal #2 Start Date  08/02/15    [facility] CM Care Plan Problem Two        Most Recent Value   Care Plan Problem Two  heart failure education/reinforcement   Role Documenting the Problem Two  Care Management Coordinator   Care Plan for Problem Two  Active   Interventions for Problem Two Long Term Goal   provided [facility] calender/organizer and instructed on how to use, reviewe zone tool   [facility] Long Term Goal (31-90) days  member/caregiver will verbalize at least three strategies for heart failure managment within the next 31-60 days.   [facility] Long Term Goal Start Date  08/21/15   [facility] CM Short Term Goal #1 (0-30 days)  caregiver/member will verbalize zone tool within the next 30 days.   [facility] CM Short Term Goal #1 Start Date  08/21/15   Interventions for Short Term Goal #2   reviewed zone tool signs/symptoms and actiona associated with symptoms.      Thea Silversmith, RN, MSN, [place] Coordinator Cell: [phone]

## 2015-08-23 DIAGNOSIS — E785 Hyperlipidemia, unspecified: Secondary | ICD-10-CM | POA: Diagnosis not present

## 2015-08-23 DIAGNOSIS — R918 Other nonspecific abnormal finding of lung field: Secondary | ICD-10-CM | POA: Diagnosis not present

## 2015-08-23 DIAGNOSIS — I482 Chronic atrial fibrillation: Secondary | ICD-10-CM | POA: Diagnosis not present

## 2015-08-23 DIAGNOSIS — E440 Moderate protein-calorie malnutrition: Secondary | ICD-10-CM | POA: Diagnosis not present

## 2015-08-23 DIAGNOSIS — I110 Hypertensive heart disease with heart failure: Secondary | ICD-10-CM | POA: Diagnosis not present

## 2015-08-23 DIAGNOSIS — I5031 Acute diastolic (congestive) heart failure: Secondary | ICD-10-CM | POA: Diagnosis not present

## 2015-08-27 ENCOUNTER — Other Ambulatory Visit: Payer: Self-pay

## 2015-08-27 DIAGNOSIS — I5031 Acute diastolic (congestive) heart failure: Secondary | ICD-10-CM | POA: Diagnosis not present

## 2015-08-27 DIAGNOSIS — I110 Hypertensive heart disease with heart failure: Secondary | ICD-10-CM | POA: Diagnosis not present

## 2015-08-27 DIAGNOSIS — E440 Moderate protein-calorie malnutrition: Secondary | ICD-10-CM | POA: Diagnosis not present

## 2015-08-27 DIAGNOSIS — R918 Other nonspecific abnormal finding of lung field: Secondary | ICD-10-CM | POA: Diagnosis not present

## 2015-08-27 DIAGNOSIS — E785 Hyperlipidemia, unspecified: Secondary | ICD-10-CM | POA: Diagnosis not present

## 2015-08-27 DIAGNOSIS — I482 Chronic atrial fibrillation: Secondary | ICD-10-CM | POA: Diagnosis not present

## 2015-08-27 NOTE — Patient Outreach (Signed)
[facility] [place] [facility]) Care Management  08/27/2015  Aftin La [birth year] [id number]  Assessment: Call to follow up regarding transition of care. No answer. HIPPA compliant message left.   Plan: await return call and follow up in a week.  Thea Silversmith, RN, MSN, [place] Coordinator Cell: [phone]

## 2015-08-28 DIAGNOSIS — E785 Hyperlipidemia, unspecified: Secondary | ICD-10-CM | POA: Diagnosis not present

## 2015-08-28 DIAGNOSIS — E440 Moderate protein-calorie malnutrition: Secondary | ICD-10-CM | POA: Diagnosis not present

## 2015-08-28 DIAGNOSIS — R918 Other nonspecific abnormal finding of lung field: Secondary | ICD-10-CM | POA: Diagnosis not present

## 2015-08-28 DIAGNOSIS — I5031 Acute diastolic (congestive) heart failure: Secondary | ICD-10-CM | POA: Diagnosis not present

## 2015-08-28 DIAGNOSIS — I482 Chronic atrial fibrillation: Secondary | ICD-10-CM | POA: Diagnosis not present

## 2015-08-28 DIAGNOSIS — I110 Hypertensive heart disease with heart failure: Secondary | ICD-10-CM | POA: Diagnosis not present

## 2015-08-29 DIAGNOSIS — I5031 Acute diastolic (congestive) heart failure: Secondary | ICD-10-CM | POA: Diagnosis not present

## 2015-08-29 DIAGNOSIS — R918 Other nonspecific abnormal finding of lung field: Secondary | ICD-10-CM | POA: Diagnosis not present

## 2015-08-29 DIAGNOSIS — I110 Hypertensive heart disease with heart failure: Secondary | ICD-10-CM | POA: Diagnosis not present

## 2015-08-29 DIAGNOSIS — E440 Moderate protein-calorie malnutrition: Secondary | ICD-10-CM | POA: Diagnosis not present

## 2015-08-29 DIAGNOSIS — I482 Chronic atrial fibrillation: Secondary | ICD-10-CM | POA: Diagnosis not present

## 2015-08-29 DIAGNOSIS — E785 Hyperlipidemia, unspecified: Secondary | ICD-10-CM | POA: Diagnosis not present

## 2015-08-30 DIAGNOSIS — Z79899 Other long term (current) drug therapy: Secondary | ICD-10-CM | POA: Diagnosis not present

## 2015-09-02 ENCOUNTER — Other Ambulatory Visit: Payer: Self-pay

## 2015-09-02 DIAGNOSIS — I10 Essential (primary) hypertension: Secondary | ICD-10-CM | POA: Diagnosis not present

## 2015-09-02 DIAGNOSIS — I509 Heart failure, unspecified: Secondary | ICD-10-CM

## 2015-09-02 DIAGNOSIS — I4891 Unspecified atrial fibrillation: Secondary | ICD-10-CM | POA: Diagnosis not present

## 2015-09-02 DIAGNOSIS — E871 Hypo-osmolality and hyponatremia: Secondary | ICD-10-CM | POA: Diagnosis not present

## 2015-09-02 NOTE — Patient Outreach (Signed)
[facility]) Care Management  09/02/2015  Nacy Bath Jun 18, 1932 [id number]  RNCM called for transition of care. Spoke with member's daughter Daine Gip who reports that member is doing well. Home health has discharged member. Member continues to weigh and record weights daily. Denies edema or swelling. Member went to primary care doctor today for follow up with good report.  RNCM discussed health coach for continued education regarding heart failure management since heart failure is new. Ms. Ebony Hail is in agreement. Ms. Ebony Hail request calls be made to her due to mother's memory issues.    RNCM reinforced 24 hour nurse advice line and encouraged Ms. Ebony Hail to call as needed.  Plan: social work referral for [employer] regarding places where member can go to engage with others. Plan transition to health coach.  Thea Silversmith, RN, MSN, [place] Coordinator Cell: [phone]

## 2015-09-03 ENCOUNTER — Other Ambulatory Visit: Payer: Self-pay | Admitting: Licensed Clinical Social Worker

## 2015-09-03 ENCOUNTER — Encounter: Payer: Self-pay | Admitting: Licensed Clinical Social Worker

## 2015-09-03 NOTE — Patient Outreach (Signed)
[place] [facility]) Care Management  09/03/2015  Andrielle Deckard 1932/07/26 [id number]   Assessment- CSW received referral from [facility] stating patient was in need of Senior Resources. CSW completed outreach to patient's daughter and daughter answered. HIPPA verifications provided. CSW introduced self, reason for call and of [facility] social work services. Family agreeable to services. Family is in need of resources where patient can be involved in the community more free of charge. CSW provided education on the [facility], [place] (daughter is fully aware of this program as well as ACE) and the [employer]. Daughter was informed that the [employer] does not do an adult day care program but the center is open to those 55 years and up for those who do not need to be supervised and is more for those who are independent. CSW informed her that have various activities from Monday - Friday from 9am to 1pm.  Patient lives with daughter and daughter has five young children. CSW educated daughter on senior resource options. Daughter reports that she has an advance directive that includes living will and health care power attorney. However, they have patient's son was put down for health care POA if daughter is unable to provide this but son has not passed away. Daughter reports that she will contact her lawyer and ask if she can make changes. Daughter is agreeable to home visit on 09/05/15.  Plan-CSW will complete home visit on 09/05/15 and inform PCP of involvement.  [facility] CM Care Plan Problem One        Most Recent Value   Care Plan Problem One  Need for community resources and support   Role Documenting the Problem One  Clinical Social Worker   Care Plan for Problem One  Active   [facility] Long Term Goal (31-90 days)  Patient and family will gain appropriate senior resources within 90 days to assist with socialization    [facility] Long Term Goal Start Date  09/03/15   Interventions for  Problem One Long Term Goal  CSW will complete home visit and provide resource education. CSW will also make referrals if needed.   [facility] CM Short Term Goal #1 (0-30 days)  Family will contact lawyer to see if they can make adjustments to advance directives   [facility] CM Short Term Goal #1 Start Date  09/03/15   Interventions for Short Term Goal #1  CSW will complete home visit and provide a copy of advance directives and show daughter how to complete Part A if needed.    [facility] CM Short Term Goal #2 (0-30 days)  Patient and family will be educated on appropriate time managment tools within 30 days   [facility] CM Short Term Goal #2 Start Date  09/03/15   Interventions for Short Term Goal #2  CSW will complete home visit and provide exercises and worksheets on time management       Eula Fried, BSW, MSW, [email or web address] Phone: [phone] Fax: [phone]

## 2015-09-05 ENCOUNTER — Other Ambulatory Visit: Payer: Self-pay | Admitting: Licensed Clinical Social Worker

## 2015-09-05 NOTE — Patient Outreach (Signed)
[facility] [facility]) Care Management  [facility] Social Work  09/05/2015  Megan Munoz 10-07-1932 [id number]   Encounter Medications:  Outpatient Encounter Prescriptions as of 09/05/2015  Medication Sig Note  . albuterol (ACCUNEB) 0.63 MG/3ML nebulizer solution Take 3 mLs by nebulization every 6 (six) hours as needed for wheezing or shortness of breath. Reported on 08/21/2015   . B Complex Vitamins (VITAMIN B COMPLEX PO) Take 1 tablet by mouth daily.    . Calcium Carb-Cholecalciferol (CALCIUM + D3) 600-200 MG-UNIT TABS Take 1-2 tablets by mouth 2 (two) times daily. 2 tablets in the morning and 1 tablet in the evening.   . carvedilol (COREG) 12.5 MG tablet Take 1 tablet (12.5 mg total) by mouth 2 (two) times daily with a meal.   . diltiazem (DILTIAZEM CD) 120 MG 24 hr capsule Take 3 capsules (360 mg total) by mouth daily.   . diphenhydrAMINE (SOMINEX) 25 MG tablet Take 50 mg by mouth at bedtime.    . feeding supplement, ENSURE ENLIVE, (ENSURE ENLIVE) LIQD Take 237 mLs by mouth 3 (three) times daily between meals.   . ferrous sulfate 325 (65 FE) MG tablet Take 325 mg by mouth daily with breakfast.   . Flaxseed, Linseed, (EQL FLAX SEED OIL) 1000 MG CAPS Take 1,000 mg by mouth daily.  08/21/2015: Per bottle 1400mg  per tablet.  Marland Kitchen FLUoxetine (PROZAC) 20 MG capsule Take 20 mg by mouth daily.   . furosemide (LASIX) 20 MG tablet Take 1 tablet (20 mg total) by mouth daily as needed for fluid or edema. 08/21/2015: Takes 20mg  on Monday and Thursday. Takes additional 20mg  on other days as needed for edema.    Marland Kitchen loratadine (CLARITIN) 10 MG tablet Take 10 mg by mouth daily.   . Melatonin 10 MG TABS Take 10 mg by mouth at bedtime.    . memantine (NAMENDA) 10 MG tablet Take 1 tablet (10 mg total) by mouth 2 (two) times daily.   . Multiple Vitamin (MULTIVITAMIN) capsule Take 1 capsule by mouth daily.   . primidone (MYSOLINE) 250 MG tablet Take 125 mg by mouth 2 (two) times daily.    . rivaroxaban (XARELTO) 20  MG TABS tablet Take 20 mg by mouth daily with supper.   . simvastatin (ZOCOR) 40 MG tablet Take 40 mg by mouth daily.   . VENTOLIN HFA 108 (90 Base) MCG/ACT inhaler Inhale 2 puffs into the lungs daily as needed. Reported on 08/21/2015    No facility-administered encounter medications on file as of 09/05/2015.    Functional Status:  In your present state of health, do you have any difficulty performing the following activities: 09/05/2015 08/21/2015  Hearing? N N  Vision? N N  Difficulty concentrating or making decisions? Tempie Donning  Walking or climbing stairs? Y Y  Dressing or bathing? Y Y  Doing errands, shopping? Tempie Donning  Preparing Food and eating ? - Y  Using the Toilet? - N  In the past six months, have you accidently leaked urine? - N  Do you have problems with loss of bowel control? - N  Managing your Medications? - Y  Managing your Finances? - Y  Housekeeping or managing your Housekeeping? - Y    Fall/Depression Screening:  PHQ 2/9 Scores 08/21/2015  PHQ - 2 Score 0    Assessment: CSW completed home visit today on 09/05/15. Patient lives with her daughter, son in law and their children in their home. Patient has a strong support network and family desire that patient  gain resources on [employer]. Patient's daughter and patient were present during home visit. They are both extremely pleasant. Patient reports that she is doing well and can walk up their stairs without any issues. Patient has a walker but does not use it at this time because she can walk without assistance. CSW provided family with various community resources and information. CSW provided family with information on the [facility] and their activities. Patient is interested in their dance classes, dance club and book club. CSW also provided family with information on the [employer] Patient reminded that both programs recommend that the patient be independent as they are not supervised. CSW provided information  on the [employer] program with the [facility]. CSW also provided information on ACE and PACE but family is not interested in this service at this time. Family also interested in time management information. CSW provided 15 pages of worksheets and tools on time management for daughter. CSW provided caregiver support group as well.  Patient denies any depression or sadness. She reports she finds pleasure by watching television, reading often, dancing, walking, playing with their new dog and spending time with her daughter (getting out of the house.) CSW questioned daughter if she had contacted lawyer about Living will and she declined. CSW provided copy of advance directives and provided education on how to complete document if needed. Transportation resources discussed with family but that decline needing SCAT or Senior Wheels as daughter can transport patient to appointments.   Plan: CSW will route encounter to PCP. Family aware that CSW will be out of the office all of next week. CSW will make next outreach in three weeks and follow up on social work needs and resources discussed during today's visit.  Eula Fried, BSW, MSW, [email or web address] Phone: [phone] Fax: [phone]

## 2015-09-06 DIAGNOSIS — E785 Hyperlipidemia, unspecified: Secondary | ICD-10-CM | POA: Diagnosis not present

## 2015-09-06 DIAGNOSIS — I5031 Acute diastolic (congestive) heart failure: Secondary | ICD-10-CM | POA: Diagnosis not present

## 2015-09-06 DIAGNOSIS — I110 Hypertensive heart disease with heart failure: Secondary | ICD-10-CM | POA: Diagnosis not present

## 2015-09-06 DIAGNOSIS — E440 Moderate protein-calorie malnutrition: Secondary | ICD-10-CM | POA: Diagnosis not present

## 2015-09-06 DIAGNOSIS — I482 Chronic atrial fibrillation: Secondary | ICD-10-CM | POA: Diagnosis not present

## 2015-09-06 DIAGNOSIS — R918 Other nonspecific abnormal finding of lung field: Secondary | ICD-10-CM | POA: Diagnosis not present

## 2015-09-09 ENCOUNTER — Other Ambulatory Visit: Payer: Self-pay

## 2015-09-09 NOTE — Patient Outreach (Signed)
[name]) Care Management  09/09/2015  Megan Munoz 07-Mar-1933 [id number]   Telephone call to daughter Daine Gip for introductory call.  She states that her mom is doing well. Explained health coach and role. Daughter is agreeable to Engineer, maintenance and outreach.    Plan: RN Health Coach will contact within 1 month and daughter/caregiver agrees to next outreach.    Jone Baseman, RN, MSN [place] [phone]

## 2015-09-16 ENCOUNTER — Other Ambulatory Visit: Payer: Self-pay | Admitting: Licensed Clinical Social Worker

## 2015-09-16 NOTE — Patient Outreach (Signed)
[facility] [facility]) Care Management  09/16/2015  Jakita Wire 1933/01/02 [id number]   Assessment- CSW completed outreach to patient and daughter answered. Daughter reports that patient continues to do extremely well. Daughter states that she has not had a chance to follow up on resources provided and initially stated that she would prefer that CSW complete discharge at this time and then start receiving services again in September because it is "hectic right now with the kids." However, daughter states that she would prefer on additional outreach call in one month before discharge takes place. CSW will complete discharge call in one month. CSW reviewed resources again that were provided during home visit.  Plan-Discharge call will take place within one month.  Eula Fried, BSW, MSW, [email or web address] Phone: [phone] Fax: [phone]

## 2015-09-23 ENCOUNTER — Other Ambulatory Visit: Payer: Self-pay

## 2015-09-23 NOTE — Patient Outreach (Signed)
[facility]) Care Management  [place]  09/23/2015   Megan Munoz 09/26/1932 [id number]  Subjective: Telephone call to daughter/caregiver for initial assessment.  She reports she is not with her mom right now but patient is doing well. Patient continues to weight and watch salt intake.  Discussed with daughter heart failure zone chart and when to notify physician. Encouraged her to review signs and symptoms with patient.  She verbalized understanding.   Objective:   Encounter Medications:  Outpatient Encounter Prescriptions as of 09/23/2015  Medication Sig Note  . albuterol (ACCUNEB) 0.63 MG/3ML nebulizer solution Take 3 mLs by nebulization every 6 (six) hours as needed for wheezing or shortness of breath. Reported on 08/21/2015   . B Complex Vitamins (VITAMIN B COMPLEX PO) Take 1 tablet by mouth daily.    . Calcium Carb-Cholecalciferol (CALCIUM + D3) 600-200 MG-UNIT TABS Take 1-2 tablets by mouth 2 (two) times daily. 2 tablets in the morning and 1 tablet in the evening.   . carvedilol (COREG) 12.5 MG tablet Take 1 tablet (12.5 mg total) by mouth 2 (two) times daily with a meal.   . diltiazem (DILTIAZEM CD) 120 MG 24 hr capsule Take 3 capsules (360 mg total) by mouth daily.   . diphenhydrAMINE (SOMINEX) 25 MG tablet Take 50 mg by mouth at bedtime.    . feeding supplement, ENSURE ENLIVE, (ENSURE ENLIVE) LIQD Take 237 mLs by mouth 3 (three) times daily between meals.   . ferrous sulfate 325 (65 FE) MG tablet Take 325 mg by mouth daily with breakfast.   . Flaxseed, Linseed, (EQL FLAX SEED OIL) 1000 MG CAPS Take 1,000 mg by mouth daily.  08/21/2015: Per bottle 1400mg  per tablet.  Marland Kitchen FLUoxetine (PROZAC) 20 MG capsule Take 20 mg by mouth daily.   . furosemide (LASIX) 20 MG tablet Take 1 tablet (20 mg total) by mouth daily as needed for fluid or edema. 08/21/2015: Takes 20mg  on Monday and Thursday. Takes additional 20mg  on other days as needed for edema.    Marland Kitchen loratadine  (CLARITIN) 10 MG tablet Take 10 mg by mouth daily.   . Melatonin 10 MG TABS Take 10 mg by mouth at bedtime.    . memantine (NAMENDA) 10 MG tablet Take 1 tablet (10 mg total) by mouth 2 (two) times daily.   . Multiple Vitamin (MULTIVITAMIN) capsule Take 1 capsule by mouth daily.   . primidone (MYSOLINE) 250 MG tablet Take 125 mg by mouth 2 (two) times daily.    . rivaroxaban (XARELTO) 20 MG TABS tablet Take 20 mg by mouth daily with supper.   . simvastatin (ZOCOR) 40 MG tablet Take 40 mg by mouth daily.   . VENTOLIN HFA 108 (90 Base) MCG/ACT inhaler Inhale 2 puffs into the lungs daily as needed. Reported on 08/21/2015    No facility-administered encounter medications on file as of 09/23/2015.    Functional Status:  In your present state of health, do you have any difficulty performing the following activities: 09/23/2015 09/05/2015  Hearing? N N  Vision? N N  Difficulty concentrating or making decisions? Tempie Donning  Walking or climbing stairs? Y Y  Dressing or bathing? Y Y  Doing errands, shopping? Tempie Donning  Preparing Food and eating ? Y -  Using the Toilet? N -  In the past six months, have you accidently leaked urine? N -  Do you have problems with loss of bowel control? N -  Managing your Medications? Y -  Managing your  Finances? Y -  Housekeeping or managing your Housekeeping? Y -    Fall/Depression Screening: PHQ 2/9 Scores 09/23/2015 08/21/2015  PHQ - 2 Score 0 0    Assessment: Patient/caregiver will benefit from health coach outreach for disease management and support.   Plan:  RN Health Coach will provide ongoing education for patient on heart failure through phone calls and sending printed information to patient for further discussion.  RN Health Coach will sent letter.   RN Health Coach will send initial barriers letter, assessment, and care plan to primary care physician.  RN Health Coach will contact patient within one month and patient agrees to next contact.   Jone Baseman,  RN, MSN [place] [phone]

## 2015-10-14 ENCOUNTER — Other Ambulatory Visit: Payer: Self-pay | Admitting: Licensed Clinical Social Worker

## 2015-10-14 NOTE — Patient Outreach (Signed)
[facility]) Care Management  10/14/2015  Florencia Ebsen Aug 19, 1932 [id number]   Assessment-CSW completed outreach attempt today to patient's daughter. CSW unable to reach patient or family successfully in order to discuss senior resources per daughter request. CSW left a HIPPA compliant voice message encouraging patient to return call once available.  Plan-CSW will await return call or complete an additional outreach if needed in one week.  Eula Fried, BSW, MSW, [email or web address] Phone: [phone] Fax: [phone]

## 2015-10-18 ENCOUNTER — Other Ambulatory Visit: Payer: Self-pay

## 2015-10-18 NOTE — Patient Outreach (Signed)
[facility]) Care Management  10/18/2015  Rakeb Bucknam Mar 14, 1933 [id number]   Telephone call to daughter for monthly call. No answer.  HIPAA compliant voice message left.  Plan: RN Health Coach will attempt patient in the month of August.  Emaley Applin J Eivin Mascio, RN, MSN [place] [phone]

## 2015-10-23 DIAGNOSIS — Z79899 Other long term (current) drug therapy: Secondary | ICD-10-CM | POA: Diagnosis not present

## 2015-10-23 DIAGNOSIS — R5383 Other fatigue: Secondary | ICD-10-CM | POA: Diagnosis not present

## 2015-10-23 DIAGNOSIS — I10 Essential (primary) hypertension: Secondary | ICD-10-CM | POA: Diagnosis not present

## 2015-10-23 DIAGNOSIS — E785 Hyperlipidemia, unspecified: Secondary | ICD-10-CM | POA: Diagnosis not present

## 2015-10-23 DIAGNOSIS — E559 Vitamin D deficiency, unspecified: Secondary | ICD-10-CM | POA: Diagnosis not present

## 2015-10-25 ENCOUNTER — Other Ambulatory Visit: Payer: Self-pay | Admitting: Licensed Clinical Social Worker

## 2015-10-25 DIAGNOSIS — M25552 Pain in left hip: Secondary | ICD-10-CM | POA: Diagnosis not present

## 2015-10-25 DIAGNOSIS — E785 Hyperlipidemia, unspecified: Secondary | ICD-10-CM | POA: Diagnosis not present

## 2015-10-25 DIAGNOSIS — I10 Essential (primary) hypertension: Secondary | ICD-10-CM | POA: Diagnosis not present

## 2015-10-25 DIAGNOSIS — I509 Heart failure, unspecified: Secondary | ICD-10-CM | POA: Diagnosis not present

## 2015-10-25 NOTE — Patient Outreach (Signed)
[facility]) Care Management  10/25/2015  Megan Munoz 1932-07-12 [id number]   Assessment-CSW completed second outreach attempt today. CSW unable to reach patient successfully. CSW left a HIPPA compliant voice message encouraging patient to return call once available.  Plan-CSW will await return call or complete an additional outreach if needed within one week.  Eula Fried, BSW, MSW, [email or web address] Phone: [phone] Fax: [phone]

## 2015-10-28 ENCOUNTER — Other Ambulatory Visit: Payer: Self-pay | Admitting: Family Medicine

## 2015-10-28 ENCOUNTER — Ambulatory Visit
Admission: RE | Admit: 2015-10-28 | Discharge: 2015-10-28 | Disposition: A | Discharge status: Home / Self Care | Payer: Medicare Other | Source: Ambulatory Visit | Attending: Family Medicine | Admitting: Family Medicine

## 2015-10-28 DIAGNOSIS — M25552 Pain in left hip: Secondary | ICD-10-CM | POA: Diagnosis not present

## 2015-10-28 DIAGNOSIS — R52 Pain, unspecified: Secondary | ICD-10-CM

## 2015-10-28 DIAGNOSIS — M47816 Spondylosis without myelopathy or radiculopathy, lumbar region: Secondary | ICD-10-CM | POA: Diagnosis not present

## 2015-10-30 ENCOUNTER — Telehealth: Payer: Self-pay | Admitting: Pulmonary Disease

## 2015-10-30 ENCOUNTER — Ambulatory Visit (INDEPENDENT_AMBULATORY_CARE_PROVIDER_SITE_OTHER): Payer: Medicare Other | Admitting: Pulmonary Disease

## 2015-10-30 ENCOUNTER — Encounter: Payer: Self-pay | Admitting: Pulmonary Disease

## 2015-10-30 VITALS — BP 122/68 | HR 80 | Ht 67.0 in | Wt 129.0 lb

## 2015-10-30 DIAGNOSIS — R599 Enlarged lymph nodes, unspecified: Secondary | ICD-10-CM | POA: Diagnosis not present

## 2015-10-30 DIAGNOSIS — I5032 Chronic diastolic (congestive) heart failure: Secondary | ICD-10-CM | POA: Insufficient documentation

## 2015-10-30 DIAGNOSIS — R918 Other nonspecific abnormal finding of lung field: Secondary | ICD-10-CM | POA: Diagnosis not present

## 2015-10-30 DIAGNOSIS — R590 Localized enlarged lymph nodes: Secondary | ICD-10-CM | POA: Insufficient documentation

## 2015-10-30 NOTE — Telephone Encounter (Signed)
IMAGING PORT CXR 07/30/15 (personally reviewed by me): Some improvement in bilateral opacities that appeared to be predominantly apical. Pleural thickening is noted. Blunting of right costophrenic angle with suspected residual pleural effusion and improvement in what appears to be left pleural effusion.  CT CHEST W/ 07/24/15 (personally reviewed by me): Emphysema noted. Patchy bilateral parenchymal opacities especially in the apices with some groundglass. Moderate right and small left pleural effusion. Pleural thickening noted. Area of consolidation versus mass in right apex noted. Mediastinal lymphadenopathy noted with largest measuring 1.2 cm precarinal. No pericardial effusion.

## 2015-10-30 NOTE — Patient Instructions (Addendum)
   Call me if you have any new breathing problems before your next appointment.  I will see you back in 4 weeks or sooner if needed.  TESTS ORDERED: 1. CT Chest w/o contrast

## 2015-10-30 NOTE — Progress Notes (Signed)
Subjective:    Patient ID: Megan Munoz, female    DOB: [birth year], 82 y.o.   MRN: [id number]  C.C.:  Follow-up after hospitalization in May 2017 with RUL Mass & Mediastinal Adenopathy.  HPI  Patient was diuresed in hospital with resolution of hypoxia and significant improvement in breathing.  RUL Mass w/ Mediastinal Adenopathy:  Reportedly patient had imaging in [place] during her treatment for pneumonia just prior to her admission in [name], [place], without mention of a "lung mass". She feels her breathing is doing "ok". She denies any coughing.   Review of Systems No chest pressure or pain. No fever, chills, or sweats. No nausea, emesis, or dairrhea.   Allergies  Allergen Reactions  . Tetanus Toxoids Other (See Comments)    Told never to take     Current Outpatient Prescriptions on File Prior to Visit  Medication Sig Dispense Refill  . albuterol (ACCUNEB) 0.63 MG/3ML nebulizer solution Take 3 mLs by nebulization every 6 (six) hours as needed for wheezing or shortness of breath. Reported on 08/21/2015  0  . B Complex Vitamins (VITAMIN B COMPLEX PO) Take 1 tablet by mouth daily.     . carvedilol (COREG) 12.5 MG tablet Take 1 tablet (12.5 mg total) by mouth 2 (two) times daily with a meal. 180 tablet 3  . diltiazem (DILTIAZEM CD) 120 MG 24 hr capsule Take 3 capsules (360 mg total) by mouth daily. 90 capsule 3  . diphenhydrAMINE (SOMINEX) 25 MG tablet Take 50 mg by mouth at bedtime.     . feeding supplement, ENSURE ENLIVE, (ENSURE ENLIVE) LIQD Take 237 mLs by mouth 3 (three) times daily between meals. 237 mL 12  . ferrous sulfate 325 (65 FE) MG tablet Take 325 mg by mouth daily with breakfast.    . Flaxseed, Linseed, (EQL FLAX SEED OIL) 1000 MG CAPS Take 1,000 mg by mouth daily.     Marland Kitchen FLUoxetine (PROZAC) 20 MG capsule Take 20 mg by mouth daily.    . furosemide (LASIX) 20 MG tablet Take 1 tablet (20 mg total) by mouth daily as needed for fluid or edema. 30 tablet 6  . loratadine (CLARITIN) 10  MG tablet Take 10 mg by mouth daily.    . Melatonin 10 MG TABS Take 10 mg by mouth at bedtime.     . memantine (NAMENDA) 10 MG tablet Take 1 tablet (10 mg total) by mouth 2 (two) times daily. 180 tablet 3  . Multiple Vitamin (MULTIVITAMIN) capsule Take 1 capsule by mouth daily.    . primidone (MYSOLINE) 250 MG tablet Take 125 mg by mouth 2 (two) times daily.     . rivaroxaban (XARELTO) 20 MG TABS tablet Take 20 mg by mouth daily with supper.    . simvastatin (ZOCOR) 40 MG tablet Take 40 mg by mouth daily.    . VENTOLIN HFA 108 (90 Base) MCG/ACT inhaler Inhale 2 puffs into the lungs daily as needed. Reported on 08/21/2015  0   No current facility-administered medications on file prior to visit.     Past Medical History:  Diagnosis Date  . Anxiety   . Atherosclerosis of aortic arch [facility]) April 2017   Noted on CT chest, calcified aortic atherosclerosis at the arch.  . Atrial fibrillation, permanent ([place]) 03/2013   Previously on Atenolol for Rate - now on combination diltiazem and carvedilol for rate Control;  & Xarelto for AC. CHADS2VASC2 = 7.  . Cerebral infarction [facility]) 10/2008   MRI Brain 05/2014 for  near syncope revealed: No acure infarct.  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   Mild small vessel disease type changes.  No intracranial hemorrhage.  Global atrophy.   . CHF (congestive heart failure) ([place])   . Coronary atherosclerosis due to calcified coronary lesion April 2017   Noted on CT chest  . Hyperlipemia   . Hypertension   . Tinea unguium   . Tremor     Past Surgical History:  Procedure Laterality Date  . BLADDER SURGERY    . CARPAL TUNNEL RELEASE Left   . FOOT SURGERY Right   . HERNIA REPAIR    . NASAL SINUS SURGERY    . TOTAL ABDOMINAL HYSTERECTOMY      Family History  Problem Relation Age of Onset  . Osteoporosis Mother   . Emphysema Father   . Lung disease Father      Black Lung  . Stroke Brother   . Cancer Maternal Grandmother   . Emphysema Paternal Grandmother   . Asthma Daughter     allergy induced    Social History   Social History  . Marital status: Widowed    Spouse name: N/A  . Number of children: 1  . Years of education: 75   Occupational History  . Retired    Social History Main Topics  . Smoking status: Former Smoker    Packs/day: 1.00    Years: 16.00    Start date: 03/30/1954    Quit date: 03/30/1970  . Smokeless tobacco: Never Used     Comment: smoked 1/2-1 ppd  . Alcohol use No  . Drug use: No  . Sexual activity: Not Asked   Other Topics Concern  . None   Social History Narrative   Widow.  Lives with her daughter & her family.  (Son-in-law & 5 Grandsons).   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   2-4 cups caffeine/day   1-2 glassess of wine/beer  Per week.  Never smoked.      [facility]:   Originally from [place]. She lived in [place], [place], [place], & [place]. 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 No bird exposure. No mold or hot tub exposure.       Objective:   Physical Exam BP 122/68 (BP Location: Left Arm, Cuff Size: Normal)   Pulse 80   Ht 5\' 7"  (1.702 m)   Wt 129 lb (58.5 kg)   SpO2 99%   BMI 20.20 kg/m  General:  Awake. Alert. No acute distress. Daughter with patient today.  Integument:  Warm & dry. No rash on exposed skin. No bruising. HEENT:  Moist mucus membranes. No oral ulcers. No scleral injection or icterus.  Cardiovascular:  Regular rate. No edema. No appreciable JVD.  Pulmonary:  Good aeration & clear to auscultation bilaterally. Symmetric chest wall expansion. No accessory muscle use on room air. Abdomen: Soft. Normal bowel sounds. Nondistended. Grossly nontender. Musculoskeletal:  Normal bulk and tone. Hand grip strength 5/5 bilaterally. No joint deformity or effusion appreciated.  IMAGING PORT CXR 07/30/15 (personally reviewed by me): Some  improvement in bilateral opacities that appeared to be predominantly apical. Pleural thickening is noted. Blunting of right costophrenic angle with suspected residual pleural effusion and improvement in what appears to be left pleural effusion.  CT CHEST W/ 07/24/15 (personally reviewed by me): Emphysema noted. Patchy bilateral parenchymal opacities especially  in the apices with some groundglass. Moderate right and small left pleural effusion. Pleural thickening noted along the apices bilaterally. Area of consolidation versus mass in right apex noted. Mediastinal lymphadenopathy noted with largest measuring 1.2 cm precarinal. No pericardial effusion.    Assessment & Plan:  82 y.o. female treated for pneumonia in April/May of this year. Patient had improvement in bilateral pleural effusions likely related to decompensated diastolic congestive heart failure. Currently she has no respiratory symptoms of significance. I reviewed her CT scan with both she and her daughter who was present today showing a right upper lobe opacity that suspicious for fibrosis/scar tissue formation versus an underlying malignancy. I favor scar tissue formation given her pleural thickening bilaterally. We will review her CT scan at her follow-up appointment and I instructed her to contact my office if she had any new breathing problems before her next appointment. I suspect the mediastinal adenopathy is due to her decompensated diastolic congestive heart failure.  1. RUL Mass: Checking CT chest without contrast.  2. Mediastinal Adenopathy:  Checking CT chest without contrast.  3. Follow-up: Patient to return to clinic in 4 weeks or sooner if needed.   Megan Munoz Ashok Cordia, M.D. [facility] Pager:  [phone] After 3pm or if no response, call [phone] 11:11 AM 10/30/15

## 2015-10-30 NOTE — Telephone Encounter (Signed)
Megan Munoz 07/13/15 (per radiologist): Patchy bilateral infiltrates. No pulmonary embolism. Moderate to large right pleural effusion & small to moderate left pleural effusion. Cardiomegaly with vessel congestion. Dilated ventricles. Soft tissue edema noted.  CT CHEST/ W/ 07/11/15 (per radiologist): Minimal adenopathy of the mediastinal precarinal lymph node measuring 1.6 x 1.4 cm. Enlarged subcarinal lymph node measuring 2.7 x 1.2 cm. Several other not enlarged mediastinal lymph nodes noted. Enlarged right suprahilar lymph node measuring 2.0 x 1.6 cm. Several smaller right hilar lymph nodes noted. Minimally enlarged left hilar lymph nodes measuring 1.6 x 1.4 cm & 1.5 x 1.27 m. Cardiomegaly demonstrated. Aorta and great vessels unremarkable. Trachea and major bronchi patent. No pericardial effusion. Bilateral pleural effusions small to moderate right side and small left side. Partial compressive atelectasis of the lower lobes. Extensive patchy bilateral interstitial and groundglass infiltrates in addition to small nodular airspace infiltrates. Larger triangular airspace consolidation involving the lingula. Chronic changes demonstrated in the apices. No well-defined nodule or mass demonstrated. Subsegmental atelectasis demonstrated along the inferior medial aspect right upper lobe anteriorly.  LABS 07/08/15 CBC: 9.5/11.9/35.9/280 BMP: 133/4.1/98/25/15/0.5/135 LFT: 3.7/6.7/0.8/107/43/46 BNP:  707 Troponin I:  <0.019 CKMB:  1.15 INR:  1.54

## 2015-10-31 ENCOUNTER — Other Ambulatory Visit: Payer: Self-pay

## 2015-10-31 NOTE — Patient Outreach (Signed)
[facility]) Care Management  10/31/2015  Johnnay Varnadoe Dec 27, 1932 [id number]   2nd telephone call to daughter for monthly call. No answer.  HIPAA compliant voice message left.   Plan: RN Health will attempt again in the August.   Jaz Mallick Durwin Reges, RN, MSN [place] [phone]

## 2015-11-01 ENCOUNTER — Encounter: Payer: Self-pay | Admitting: Licensed Clinical Social Worker

## 2015-11-01 ENCOUNTER — Ambulatory Visit (INDEPENDENT_AMBULATORY_CARE_PROVIDER_SITE_OTHER)
Admission: RE | Admit: 2015-11-01 | Discharge: 2015-11-01 | Disposition: A | Discharge status: Home / Self Care | Payer: Medicare Other | Source: Ambulatory Visit | Attending: Pulmonary Disease | Admitting: Pulmonary Disease

## 2015-11-01 ENCOUNTER — Other Ambulatory Visit: Payer: Self-pay | Admitting: Licensed Clinical Social Worker

## 2015-11-01 DIAGNOSIS — R590 Localized enlarged lymph nodes: Secondary | ICD-10-CM

## 2015-11-01 DIAGNOSIS — R599 Enlarged lymph nodes, unspecified: Secondary | ICD-10-CM

## 2015-11-01 DIAGNOSIS — R918 Other nonspecific abnormal finding of lung field: Secondary | ICD-10-CM | POA: Insufficient documentation

## 2015-11-01 DIAGNOSIS — J189 Pneumonia, unspecified organism: Secondary | ICD-10-CM | POA: Diagnosis not present

## 2015-11-01 NOTE — Patient Outreach (Signed)
[facility]) Care Management  11/01/2015  Megan Munoz April 06, 1932 [id number]   Assessment- CSW completed third and final outreach to patient but was unable to reach patient successfully. HIPPA compliant voice message left.  CSW will send outreach barrier to patient at this time.  Plan-CSW will wait 10 business days to hear back from patient before completing case closure.  Eula Fried, BSW, MSW, [email or web address] Phone: [phone] Fax: [phone]

## 2015-11-08 DIAGNOSIS — E785 Hyperlipidemia, unspecified: Secondary | ICD-10-CM | POA: Diagnosis not present

## 2015-11-08 DIAGNOSIS — I5031 Acute diastolic (congestive) heart failure: Secondary | ICD-10-CM | POA: Diagnosis not present

## 2015-11-08 DIAGNOSIS — I482 Chronic atrial fibrillation: Secondary | ICD-10-CM | POA: Diagnosis not present

## 2015-11-08 DIAGNOSIS — I10 Essential (primary) hypertension: Secondary | ICD-10-CM | POA: Diagnosis not present

## 2015-11-11 ENCOUNTER — Other Ambulatory Visit: Payer: Self-pay

## 2015-11-11 NOTE — Patient Outreach (Signed)
Megan Munoz) Care Management  [place]  11/11/2015   Megan Munoz [birth year] [id number]  Subjective: Telephone call to daughter for monthly call. Daughter reports patient is doing good.  However, she is having some issues with back pain recently. She denies any problems with heart failure.  Reviewed with daughter signs of heart failure and when to notify physician.  Also discussed with daughter importance of reviewing symptoms with patient. She verbalized understanding.    Objective:   Encounter Medications:  Outpatient Encounter Prescriptions as of 11/11/2015  Medication Sig Note  . albuterol (ACCUNEB) 0.63 MG/3ML nebulizer solution Take 3 mLs by nebulization every 6 (six) hours as needed for wheezing or shortness of breath. Reported on 08/21/2015   . B Complex Vitamins (VITAMIN B COMPLEX PO) Take 1 tablet by mouth daily.    . carvedilol (COREG) 12.5 MG tablet Take 1 tablet (12.5 mg total) by mouth 2 (two) times daily with a meal.   . diltiazem (DILTIAZEM CD) 120 MG 24 hr capsule Take 3 capsules (360 mg total) by mouth daily.   . diphenhydrAMINE (SOMINEX) 25 MG tablet Take 50 mg by mouth at bedtime.    . feeding supplement, ENSURE ENLIVE, (ENSURE ENLIVE) LIQD Take 237 mLs by mouth 3 (three) times daily between meals.   . ferrous sulfate 325 (65 FE) MG tablet Take 325 mg by mouth daily with breakfast.   . Flaxseed, Linseed, (EQL FLAX SEED OIL) 1000 MG CAPS Take 1,000 mg by mouth daily.  08/21/2015: Per bottle 1400mg  per tablet.  Marland Kitchen FLUoxetine (PROZAC) 20 MG capsule Take 20 mg by mouth daily.   . furosemide (LASIX) 20 MG tablet Take 1 tablet (20 mg total) by mouth daily as needed for fluid or edema. 08/21/2015: Takes 20mg  on Monday and Thursday. Takes additional 20mg  on other days as needed for edema.    Marland Kitchen loratadine (CLARITIN) 10 MG tablet Take 10 mg by mouth daily.   . Melatonin 10 MG TABS Take 10 mg by mouth at bedtime.    . memantine (NAMENDA) 10 MG tablet Take 1  tablet (10 mg total) by mouth 2 (two) times daily.   . Multiple Vitamin (MULTIVITAMIN) capsule Take 1 capsule by mouth daily.   . primidone (MYSOLINE) 250 MG tablet Take 125 mg by mouth 2 (two) times daily.    . rivaroxaban (XARELTO) 20 MG TABS tablet Take 20 mg by mouth daily with supper.   . simvastatin (ZOCOR) 40 MG tablet Take 40 mg by mouth daily.   . VENTOLIN HFA 108 (90 Base) MCG/ACT inhaler Inhale 2 puffs into the lungs daily as needed. Reported on 08/21/2015    No facility-administered encounter medications on file as of 11/11/2015.     Functional Status:  In your present state of health, do you have any difficulty performing the following activities: 09/23/2015 09/05/2015  Hearing? N N  Vision? N N  Difficulty concentrating or making decisions? Tempie Donning  Walking or climbing stairs? Y Y  Dressing or bathing? Y Y  Doing errands, shopping? Tempie Donning  Preparing Food and eating ? Y -  Using the Toilet? N -  In the past six months, have you accidently leaked urine? N -  Do you have problems with loss of bowel control? N -  Managing your Medications? Y -  Managing your Finances? Y -  Housekeeping or managing your Housekeeping? Y -  Some recent data might be hidden    Fall/Depression Screening: PHQ 2/9 Scores 09/23/2015 08/21/2015  PHQ - 2 Score 0 0    Assessment: Patient continues to benefit from health coach outreach for disease management and support.   Plan:  [facility] CM Care Plan Problem Two   Flowsheet Row Most Recent Value  Care Plan Problem Two  heart failure education/reinforcement  Role Documenting the Problem Two  [place] for Problem Two  Active  Interventions for Problem Two Long Term Goal   RN Health Coach reviewed with daughter heart failure action plan and symptoms.   THN Long Term Goal (31-90) days  member/caregiver will verbalize at least three strategies for heart failure managment within the next 90 days.  THN Long Term Goal Start Date  11/11/15 [goal  contiunued]  THN CM Short Term Goal #1 (0-30 days)  caregiver/member will verbalize zone tool within the next 30 days.  THN CM Short Term Goal #1 Start Date  11/11/15 [goal continued]  Interventions for Short Term Goal #2   RN Health Coach reviewed with daughter importance of reviewing heart failure zones with patient.      RN Health Coach will contact patient in the month of September and patient agrees to next outreach.  Jone Baseman, RN, MSN [place] [phone]

## 2015-11-14 DIAGNOSIS — M549 Dorsalgia, unspecified: Secondary | ICD-10-CM | POA: Diagnosis not present

## 2015-11-14 DIAGNOSIS — M4316 Spondylolisthesis, lumbar region: Secondary | ICD-10-CM | POA: Diagnosis not present

## 2015-11-15 ENCOUNTER — Encounter: Payer: Self-pay | Admitting: Licensed Clinical Social Worker

## 2015-11-15 ENCOUNTER — Other Ambulatory Visit: Payer: Self-pay | Admitting: Licensed Clinical Social Worker

## 2015-11-15 NOTE — Patient Outreach (Signed)
[facility]) Care Management  11/15/2015  Megan Munoz 1932/09/09 [id number]   Assessment- CSW has waited 10 business day after sending outreach barrier letter. CSW will complete social work discharge at this time as CSW completed three unsuccessful outreach attempts.  Plan-CSW will update RNCM and PCP. CSW will close case.  Eula Fried, BSW, MSW, [email or web address] Phone: [phone] Fax: [phone]

## 2015-11-20 DIAGNOSIS — M5126 Other intervertebral disc displacement, lumbar region: Secondary | ICD-10-CM | POA: Diagnosis not present

## 2015-11-20 DIAGNOSIS — M4316 Spondylolisthesis, lumbar region: Secondary | ICD-10-CM | POA: Diagnosis not present

## 2015-11-22 DIAGNOSIS — M4316 Spondylolisthesis, lumbar region: Secondary | ICD-10-CM | POA: Diagnosis not present

## 2015-11-23 ENCOUNTER — Other Ambulatory Visit: Payer: Self-pay | Admitting: Neurosurgery

## 2015-11-23 DIAGNOSIS — R5381 Other malaise: Secondary | ICD-10-CM

## 2015-11-23 DIAGNOSIS — M4316 Spondylolisthesis, lumbar region: Secondary | ICD-10-CM

## 2015-12-04 DIAGNOSIS — M545 Low back pain: Secondary | ICD-10-CM | POA: Diagnosis not present

## 2015-12-04 DIAGNOSIS — I4891 Unspecified atrial fibrillation: Secondary | ICD-10-CM | POA: Diagnosis not present

## 2015-12-04 DIAGNOSIS — Z23 Encounter for immunization: Secondary | ICD-10-CM | POA: Diagnosis not present

## 2015-12-04 DIAGNOSIS — I10 Essential (primary) hypertension: Secondary | ICD-10-CM | POA: Diagnosis not present

## 2015-12-04 DIAGNOSIS — I509 Heart failure, unspecified: Secondary | ICD-10-CM | POA: Diagnosis not present

## 2015-12-06 ENCOUNTER — Encounter: Payer: Self-pay | Admitting: Pulmonary Disease

## 2015-12-06 ENCOUNTER — Ambulatory Visit (INDEPENDENT_AMBULATORY_CARE_PROVIDER_SITE_OTHER): Payer: Medicare Other | Admitting: Pulmonary Disease

## 2015-12-06 VITALS — BP 124/74 | HR 79 | Ht 67.0 in | Wt 120.6 lb

## 2015-12-06 DIAGNOSIS — J189 Pneumonia, unspecified organism: Secondary | ICD-10-CM

## 2015-12-06 DIAGNOSIS — R918 Other nonspecific abnormal finding of lung field: Secondary | ICD-10-CM

## 2015-12-06 NOTE — Patient Instructions (Addendum)
   Call me if you have any new breathing problems or questions before your next appointment.  I will see you back in February or sooner if needed.  TESTS ORDERED: 1. CT Chest w/o February 2018 2. Modified Barium Swallow

## 2015-12-06 NOTE — Progress Notes (Signed)
Subjective:    Patient ID: Megan Munoz, female    DOB: 1932/07/03, 82 y.o.   MRN: [id number]  C.C.:  Follow-up after hospitalization in May 2017 with RUL Mass & Mediastinal Adenopathy.  HPI  RUL Mass w/ Mediastinal Adenopathy: Treated for pneumonia in May 2017. She reports no breathing problems since her last appointment. No coughing or wheezing.   Review of Systems No chest pain or pressure. No fever, chills, or sweats. No nausea or emesis.   Allergies  Allergen Reactions  . Tetanus Toxoids Other (See Comments)    Told never to take     Current Outpatient Prescriptions on File Prior to Visit  Medication Sig Dispense Refill  . albuterol (ACCUNEB) 0.63 MG/3ML nebulizer solution Take 3 mLs by nebulization every 6 (six) hours as needed for wheezing or shortness of breath. Reported on 08/21/2015  0  . B Complex Vitamins (VITAMIN B COMPLEX PO) Take 1 tablet by mouth daily.     . carvedilol (COREG) 12.5 MG tablet Take 1 tablet (12.5 mg total) by mouth 2 (two) times daily with a meal. 180 tablet 3  . diltiazem (DILTIAZEM CD) 120 MG 24 hr capsule Take 3 capsules (360 mg total) by mouth daily. 90 capsule 3  . diphenhydrAMINE (SOMINEX) 25 MG tablet Take 50 mg by mouth at bedtime.     . feeding supplement, ENSURE ENLIVE, (ENSURE ENLIVE) LIQD Take 237 mLs by mouth 3 (three) times daily between meals. 237 mL 12  . ferrous sulfate 325 (65 FE) MG tablet Take 325 mg by mouth daily with breakfast.    . Flaxseed, Linseed, (EQL FLAX SEED OIL) 1000 MG CAPS Take 1,000 mg by mouth daily.     Marland Kitchen FLUoxetine (PROZAC) 20 MG capsule Take 20 mg by mouth daily.    . furosemide (LASIX) 20 MG tablet Take 1 tablet (20 mg total) by mouth daily as needed for fluid or edema. 30 tablet 6  . loratadine (CLARITIN) 10 MG tablet Take 10 mg by mouth daily.    . Melatonin 10 MG TABS Take 10 mg by mouth at bedtime.     . memantine (NAMENDA) 10 MG tablet Take 1 tablet (10 mg total) by mouth 2 (two) times daily. 180 tablet 3  .  Multiple Vitamin (MULTIVITAMIN) capsule Take 1 capsule by mouth daily.    . primidone (MYSOLINE) 250 MG tablet Take 125 mg by mouth 2 (two) times daily.     . rivaroxaban (XARELTO) 20 MG TABS tablet Take 20 mg by mouth daily with supper.    . simvastatin (ZOCOR) 40 MG tablet Take 40 mg by mouth daily.    . VENTOLIN HFA 108 (90 Base) MCG/ACT inhaler Inhale 2 puffs into the lungs daily as needed. Reported on 08/21/2015  0   No current facility-administered medications on file prior to visit.     Past Medical History:  Diagnosis Date  . Anxiety   . Atherosclerosis of aortic arch [facility]) April 2017   Noted on CT chest, calcified aortic atherosclerosis at the arch.  . Atrial fibrillation, permanent ([place]) 03/2013   Previously on Atenolol for Rate - now on combination diltiazem and carvedilol for rate Control;  & Xarelto for AC. CHADS2VASC2 = 7.  . Cerebral infarction [facility]) 10/2008   MRI Brain 05/2014 for near syncope revealed: No acure infarct.  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   Mild small vessel disease  type changes.  No intracranial hemorrhage.  Global atrophy.   . CHF (congestive heart failure) ([place])   . Coronary atherosclerosis due to calcified coronary lesion April 2017   Noted on CT chest  . Hyperlipemia   . Hypertension   . Tinea unguium   . Tremor     Past Surgical History:  Procedure Laterality Date  . BLADDER SURGERY    . CARPAL TUNNEL RELEASE Left   . FOOT SURGERY Right   . HERNIA REPAIR    . NASAL SINUS SURGERY    . TOTAL ABDOMINAL HYSTERECTOMY      Family History  Problem Relation Age of Onset  . Osteoporosis Mother   . Emphysema Father   . Lung disease Father     Black Lung  . Stroke Brother   . Cancer Maternal Grandmother   . Emphysema Paternal Grandmother   . Asthma Daughter     allergy induced    Social History   Social History  . Marital status: Widowed      Spouse name: N/A  . Number of children: 1  . Years of education: 60   Occupational History  . Retired    Social History Main Topics  . Smoking status: Former Smoker    Packs/day: 1.00    Years: 16.00    Start date: 03/30/1954    Quit date: 03/30/1970  . Smokeless tobacco: Never Used     Comment: smoked 1/2-1 ppd  . Alcohol use No  . Drug use: No  . Sexual activity: Not Asked   Other Topics Concern  . None   Social History Narrative   Widow.  Lives with her daughter & her family.  (Son-in-law & 5 Grandsons).   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   2-4 cups caffeine/day   1-2 glassess of wine/beer  Per week.  Never smoked.      [facility]:   Originally from [place]. She lived in [place], [place], [place], & [place]. 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 No bird exposure. No mold or hot tub exposure.       Objective:   Physical Exam BP 124/74 (BP Location: Right Arm, Patient Position: Sitting, Cuff Size: Normal)   Pulse 79   Ht 5\' 7"  (1.702 m)   Wt 120 lb 9.6 oz (54.7 kg)   SpO2 98%   BMI 18.89 kg/m  General:  Awake. Alert. No distress. Daughter with patient today.  Integument:  Warm & dry. No rash on exposed skin.  HEENT:  Moist mucus membranes. No oral ulcers. No scleral icterus.  Cardiovascular:  Regular rate. No edema. Normal S1 & S2. Pulmonary:  Clear bilaterally to auscultation. Normal work of breathing on room air. Abdomen: Soft. Normal bowel sounds. Nondistended.   IMAGING CT CHEST W/O 11/01/15 (personally reviewed by me):  No pleural effusion. Bilateral apical pleural thickening and parenchymal linear opacity consistent with scar formation. No pathologic mediastinal adenopathy with significant improvement since previously noted. Significant improvement in bilateral opacities with 3 mm left lower lobe nodule, 5 mm groundglass nodule in lingula, & 8 mm groundglass right lower lobe nodule noted. No pericardial  effusion.  PORT CXR 07/30/15 (previously reviewed by me): Some improvement in bilateral opacities that appeared to be predominantly apical. Pleural thickening is noted. Blunting of right costophrenic angle with suspected residual pleural effusion and improvement in what appears to be left pleural effusion.  CT CHEST W/ 07/24/15 (previously reviewed by  me): Emphysema noted. Patchy bilateral parenchymal opacities especially in the apices with some groundglass. Moderate right and small left pleural effusion. Pleural thickening noted along the apices bilaterally. Area of consolidation versus mass in right apex noted. Mediastinal lymphadenopathy noted with largest measuring 1.2 cm precarinal. No pericardial effusion.    Assessment & Plan:  82 y.o. female treated for pneumonia in April/May of 2017.Patient's bilateral opacities and mediastinal lymphadenopathy seem to have significantly improved and nearly completely resolved. Patient is left with residual subcentimeter nodules bilaterally. We did review the imaging with both she and her daughter who was present today. Given the potential risk of developing malignancy they wish to continue monitoring with CT imaging which I feel is reasonable according to current guidelines. With her history of recurrent pneumonia and previous stroke I do question her potential for aspiration which will need to be assessed. I instructed the patient and her daughter to contact my office if she had any new breathing problems or questions before her next appointment.  1. Bilateral Lung Nodules: Repeat CT chest without contrast February 2018. 2. Recurrent Pneumonia: Checking modified barium swallow. 3. Follow-up: Patient to return to clinic in 5 months or sooner if needed.   Sonia Baller Ashok Cordia, M.D. [facility] Pager:  [phone] After 3pm or if no response, call [phone] 10:53 AM 12/06/15

## 2015-12-09 ENCOUNTER — Other Ambulatory Visit: Payer: Self-pay | Admitting: Neurosurgery

## 2015-12-09 DIAGNOSIS — S32010A Wedge compression fracture of first lumbar vertebra, initial encounter for closed fracture: Secondary | ICD-10-CM

## 2015-12-12 ENCOUNTER — Other Ambulatory Visit: Payer: Self-pay

## 2015-12-12 NOTE — Patient Outreach (Signed)
[facility]) Care Management  12/12/2015  Gwendoline Lesko Feb 20, 1933 [id number]   Telephone call to daughter Maudie Mercury for monthly call.  No answer.  HIPAA compliant voice message left.  Plan: RN Health Coach will attempt patient again in the month of September.    Jone Baseman, RN, MSN [place] [phone]

## 2015-12-16 ENCOUNTER — Ambulatory Visit (HOSPITAL_COMMUNITY)
Admission: RE | Admit: 2015-12-16 | Discharge: 2015-12-16 | Disposition: A | Discharge status: Home / Self Care | Payer: Medicare Other | Source: Ambulatory Visit | Attending: Pulmonary Disease | Admitting: Pulmonary Disease

## 2015-12-16 DIAGNOSIS — K224 Dyskinesia of esophagus: Secondary | ICD-10-CM | POA: Insufficient documentation

## 2015-12-16 DIAGNOSIS — J189 Pneumonia, unspecified organism: Secondary | ICD-10-CM

## 2015-12-16 DIAGNOSIS — Z8701 Personal history of pneumonia (recurrent): Secondary | ICD-10-CM | POA: Diagnosis not present

## 2015-12-26 ENCOUNTER — Other Ambulatory Visit: Payer: Self-pay

## 2015-12-26 NOTE — Patient Outreach (Signed)
[facility]) Care Management  12/26/2015  Megan Munoz 08-01-1932 [id number]   2nd telephone call to daughter/caregiver for monthly call. No answer.  HIPAA compliant voice message left.    Plan: RN Health Coach will attempt in the month of October.    Jone Baseman, RN, MSN [place] [phone]

## 2015-12-27 ENCOUNTER — Other Ambulatory Visit: Payer: Self-pay | Admitting: Cardiology

## 2016-01-06 ENCOUNTER — Other Ambulatory Visit: Payer: Self-pay

## 2016-01-06 NOTE — Patient Outreach (Signed)
[facility] ([facility]) Care Management  [facility] Care Manager  01/06/2016   Megan Munoz 11/30/1932 [id number]  Subjective: Telephone call to daughter for update call. She reports that patient is doing well.  She continues to weigh. No problems with swelling or shortness of breath. Daughter continues to discuss heart failure symptoms with patient and is aware when to notify physician.  Patient has met goals of the program. Discussed with daughter case closure and she is agreeable.    Objective:   Encounter Medications:  Outpatient Encounter Prescriptions as of 01/06/2016  Medication Sig Note  . albuterol (ACCUNEB) 0.63 MG/3ML nebulizer solution Take 3 mLs by nebulization every 6 (six) hours as needed for wheezing or shortness of breath. Reported on 08/21/2015   . B Complex Vitamins (VITAMIN B COMPLEX PO) Take 1 tablet by mouth daily.    . carvedilol (COREG) 12.5 MG tablet Take 1 tablet (12.5 mg total) by mouth 2 (two) times daily with a meal.   . Cholecalciferol (VITAMIN D3 SUPER STRENGTH) 2000 units CAPS Take by mouth.   . diltiazem (DILTIAZEM CD) 120 MG 24 hr capsule Take 3 capsules (360 mg total) by mouth daily.   . diphenhydrAMINE (SOMINEX) 25 MG tablet Take 50 mg by mouth at bedtime.    . feeding supplement, ENSURE ENLIVE, (ENSURE ENLIVE) LIQD Take 237 mLs by mouth 3 (three) times daily between meals.   . ferrous sulfate 325 (65 FE) MG tablet Take 325 mg by mouth daily with breakfast.   . Flaxseed, Linseed, (EQL FLAX SEED OIL) 1000 MG CAPS Take 1,000 mg by mouth daily.  08/21/2015: Per bottle 1400mg per tablet.  . FLUoxetine (PROZAC) 20 MG capsule Take 20 mg by mouth daily.   . furosemide (LASIX) 20 MG tablet Take 1 tablet (20 mg total) by mouth daily as needed for fluid or edema. 08/21/2015: Takes 20mg on Monday and Thursday. Takes additional 20mg on other days as needed for edema.    . loratadine (CLARITIN) 10 MG tablet Take 10 mg by mouth daily.   . Melatonin 10 MG TABS Take 10 mg by  mouth at bedtime.    . memantine (NAMENDA) 10 MG tablet Take 1 tablet (10 mg total) by mouth 2 (two) times daily.   . Multiple Vitamin (MULTIVITAMIN) capsule Take 1 capsule by mouth daily.   . primidone (MYSOLINE) 250 MG tablet Take 125 mg by mouth 2 (two) times daily.    . rivaroxaban (XARELTO) 20 MG TABS tablet Take 20 mg by mouth daily with supper.   . simvastatin (ZOCOR) 40 MG tablet Take 40 mg by mouth daily.   . VENTOLIN HFA 108 (90 Base) MCG/ACT inhaler Inhale 2 puffs into the lungs daily as needed. Reported on 08/21/2015    No facility-administered encounter medications on file as of 01/06/2016.     Functional Status:  In your present state of health, do you have any difficulty performing the following activities: 09/23/2015 09/05/2015  Hearing? N N  Vision? N N  Difficulty concentrating or making decisions? Y Y  Walking or climbing stairs? Y Y  Dressing or bathing? Y Y  Doing errands, shopping? Y Y  Preparing Food and eating ? Y -  Using the Toilet? N -  In the past six months, have you accidently leaked urine? N -  Do you have problems with loss of bowel control? N -  Managing your Medications? Y -  Managing your Finances? Y -  Housekeeping or managing your Housekeeping? Y -    Some recent data might be hidden    Fall/Depression Screening: PHQ 2/9 Scores 01/06/2016 09/23/2015 08/21/2015  PHQ - 2 Score 0 0 0    Assessment: Patient/caregiver have met goals of the program.   Plan:  [facility] CM Care Plan Problem Two   Flowsheet Row Most Recent Value  Care Plan Problem Two  heart failure education/reinforcement  Role Documenting the Problem Two  Health Coach  Care Plan for Problem Two  Active  Interventions for Problem Two Long Term Goal   Daughter familiar with signs of heart failure.  [facility] Long Term Goal (31-90) days  member/caregiver will verbalize at least three strategies for heart failure managment within the next 90 days.  [facility] Long Term Goal Start Date  11/11/15  [facility] Long  Term Goal Met Date  01/06/16  [facility] CM Short Term Goal #1 (0-30 days)  caregiver/member will verbalize zone tool within the next 30 days.  [facility] CM Short Term Goal #1 Start Date  11/11/15  [facility] CM Short Term Goal #1 Met Date   01/06/16  Interventions for Short Term Goal #2   Daughter discussing heart failure signs with patient.      RN Health Coach will close as patient/caregiver has met program goals. RN Health Coach will send case closure letter to patient/caregiver and physician. RN Health Coach will send care management assistant message to close program.    J , RN, MSN [facility] Care Management RN Telephonic Health Coach [phone]   

## 2016-01-09 ENCOUNTER — Other Ambulatory Visit: Payer: Self-pay | Admitting: Cardiology

## 2016-01-09 MED ORDER — DILTIAZEM HCL ER COATED BEADS 120 MG PO CP24: 360.0000 mg | ORAL_CAPSULE | Freq: Every day | ORAL | 1 refills | Status: DC

## 2016-01-09 NOTE — Telephone Encounter (Signed)
Pt's daughter is calling requesting a refill on Diltiazem 120 mg tab. Daughter stated that pt is out of medication and would like a Rx sent to [facility], 10 day supply until mail order comes, and a Rx sent to [facility], 90 day supply, daughter would like a call back.

## 2016-03-16 ENCOUNTER — Encounter: Payer: Self-pay | Admitting: Cardiology

## 2016-03-16 ENCOUNTER — Ambulatory Visit (INDEPENDENT_AMBULATORY_CARE_PROVIDER_SITE_OTHER): Payer: Medicare Other | Admitting: Cardiology

## 2016-03-16 VITALS — BP 126/76 | HR 76 | Ht 67.0 in | Wt 133.8 lb

## 2016-03-16 DIAGNOSIS — I4821 Permanent atrial fibrillation: Secondary | ICD-10-CM

## 2016-03-16 DIAGNOSIS — I10 Essential (primary) hypertension: Secondary | ICD-10-CM | POA: Diagnosis not present

## 2016-03-16 DIAGNOSIS — I5032 Chronic diastolic (congestive) heart failure: Secondary | ICD-10-CM | POA: Diagnosis not present

## 2016-03-16 DIAGNOSIS — I482 Chronic atrial fibrillation: Secondary | ICD-10-CM

## 2016-03-16 DIAGNOSIS — R600 Localized edema: Secondary | ICD-10-CM

## 2016-03-16 NOTE — Progress Notes (Signed)
PCP: [name], MD  Clinic Note: Chief Complaint  Patient presents with  . Follow-up    pt states no chest pain and states she doesn't really have SOB   . Edema    both ankles     HPI: Megan Munoz is a 82 y.o. female with a PMH below who presents today for six-month follow-up for A. Fib. When I last saw her was August 2016 down after an episode of near syncope and was found to have A. fib. She was also quite hypotensive. She is on atenolol and Xarelto for A. Fib -- Atenolol was changed to Carvedilol.    I last saw her back in May of this year as part of the hospital follow-up while in [place]. She then had a follow-up hospitalization at [facility] for heart failure. When I saw her she was actually doing relatively well and was not taking her Lasix daily.  Recent Hospitalizations: None since this spring  Studies Reviewed:   No new studies.  Interval History: [name] is doing relatively well now. She denies any active symptoms of A. fib or heart failure such as rapid irregular heartbeats or palpitations, PND orthopnea. No resting or exertional chest pain or tightness.  She has not had take any additional dose of Lasix besides the scheduled 2 days a week where she takes a twice a day dose. She does have some mild edema that is usually controlled with her current dose of Lasix  She still remains relatively oblivious of her atrial fibrillation with no rapid irregular heartbeat sensations.   No dizziness, weakness or syncope/near syncope. No TIA/amaurosis fugax symptoms. No melena, hematochezia, hematuria, or epstaxis. No claudication.  ROS: A comprehensive was performed. Review of Systems  Constitutional: Negative for malaise/fatigue and weight loss.  HENT: Negative for congestion and nosebleeds.   Respiratory: Negative for cough, shortness of breath and wheezing.   Cardiovascular:       Per history of present illness  Gastrointestinal: Negative for blood in stool and melena.    Genitourinary: Negative for hematuria.  Musculoskeletal: Negative for falls, joint pain and myalgias.  Skin: Negative.   Neurological: Negative for dizziness, loss of consciousness and headaches.  Endo/Heme/Allergies: Does not bruise/bleed easily.  Psychiatric/Behavioral: Positive for memory loss (Slowly worsening dementia. Poor executive function. Poor historian). Negative for depression. The patient has insomnia (Still has a hard time staying asleep.). The patient is not nervous/anxious.   All other systems reviewed and are negative.    Past Medical History:  Diagnosis Date  . Anxiety   . Atherosclerosis of aortic arch [facility]) April 2017   Noted on CT chest, calcified aortic atherosclerosis at the arch.  . Atrial fibrillation, permanent ([place]) 03/2013   Previously on Atenolol for Rate - now on combination diltiazem and carvedilol for rate Control;  & Xarelto for AC. CHADS2VASC2 = 7.  . Cerebral infarction [facility]) 10/2008   MRI Brain 05/2014 for near syncope revealed: No acure infarct.  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   Mild small vessel disease type changes.  No intracranial hemorrhage.  Global atrophy.   . CHF (congestive heart failure) ([place])   . Coronary atherosclerosis due to calcified coronary lesion April 2017   Noted on CT chest  . Hyperlipemia   . Hypertension   . Tinea unguium   . Tremor     Past Surgical History:  Procedure Laterality Date  . BLADDER  SURGERY    . CARPAL TUNNEL RELEASE Left   . FOOT SURGERY Right   . HERNIA REPAIR    . NASAL SINUS SURGERY    . TOTAL ABDOMINAL HYSTERECTOMY      CT Chest 4/26 : Suspected multifocal pneumonia with superimposed interstitial edema. Moderate right and small left pleural effusions. Suspected biapical pleural-parenchymal scarring, right greater than left. Underlying right apical mass is difficult to entirely exclude given the  surrounding fluid on the current study. Consider follow-up CT chest in 3 months after resolution of acute illness.  07/26/2015:Echo> LVEF 55-60%, Possible mild hypokinesis of the basal-midinferolateral myocardium. PA peak 30mmHg  Current Meds  Medication Sig  . albuterol (ACCUNEB) 0.63 MG/3ML nebulizer solution Take 3 mLs by nebulization every 6 (six) hours as needed for wheezing or shortness of breath. Reported on 08/21/2015  . B Complex Vitamins (VITAMIN B COMPLEX PO) Take 1 tablet by mouth daily.   . carvedilol (COREG) 12.5 MG tablet Take 1 tablet (12.5 mg total) by mouth 2 (two) times daily with a meal.  . Cholecalciferol (VITAMIN D3 SUPER STRENGTH) 2000 units CAPS Take by mouth.  . diltiazem (DILTIAZEM CD) 120 MG 24 hr capsule Take 3 capsules (360 mg total) by mouth daily.  . diphenhydrAMINE (SOMINEX) 25 MG tablet Take 50 mg by mouth at bedtime.   . feeding supplement, ENSURE ENLIVE, (ENSURE ENLIVE) LIQD Take 237 mLs by mouth 3 (three) times daily between meals.  . ferrous sulfate 325 (65 FE) MG tablet Take 325 mg by mouth daily with breakfast.  . Flaxseed, Linseed, (EQL FLAX SEED OIL) 1000 MG CAPS Take 1,000 mg by mouth daily.   Marland Kitchen FLUoxetine (PROZAC) 20 MG capsule Take 20 mg by mouth daily.  . furosemide (LASIX) 20 MG tablet Take 1 tablet (20 mg total) by mouth daily as needed for fluid or edema.  Marland Kitchen loratadine (CLARITIN) 10 MG tablet Take 10 mg by mouth daily.  . Melatonin 10 MG TABS Take 10 mg by mouth at bedtime.   . memantine (NAMENDA) 10 MG tablet Take 1 tablet (10 mg total) by mouth 2 (two) times daily.  . Multiple Vitamin (MULTIVITAMIN) capsule Take 1 capsule by mouth daily.  . primidone (MYSOLINE) 250 MG tablet Take 125 mg by mouth 2 (two) times daily.   . rivaroxaban (XARELTO) 20 MG TABS tablet Take 20 mg by mouth daily with supper.  . simvastatin (ZOCOR) 40 MG tablet Take 40 mg by mouth daily.  . VENTOLIN HFA 108 (90 Base) MCG/ACT inhaler Inhale 2 puffs into the lungs daily as  needed. Reported on 08/21/2015    Allergies  Allergen Reactions  . Tetanus Toxoids Other (See Comments)    Told never to take     Social History   Social History  . Marital status: Widowed    Spouse name: N/A  . Number of children: 1  . Years of education: 62   Occupational History  . Retired    Social History Main Topics  . Smoking status: Former Smoker    Packs/day: 1.00    Years: 16.00    Start date: 03/30/1954    Quit date: 03/30/1970  . Smokeless tobacco: Never Used     Comment: smoked 1/2-1 ppd  . Alcohol use No  . Drug use: No  . Sexual activity: Not Asked   Other Topics Concern  . None   Social History Narrative   Widow.  Lives with her daughter & her family.  (Son-in-law & 5 Grandsons).  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   2-4 cups caffeine/day   1-2 glassess of wine/beer  Per week.  Never smoked.      [facility]:   Originally from [place]. She lived in [place], [place], [place], & [place]. 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 No bird exposure. No mold or hot tub exposure.    Family History family history includes Asthma in her daughter; Cancer in her maternal grandmother; Emphysema in her father and paternal grandmother; Lung disease in her father; Osteoporosis in her mother; Stroke in her brother.  Wt Readings from Last 3 Encounters:  03/16/16 60.7 kg (133 lb 12.8 oz)  12/06/15 54.7 kg (120 lb 9.6 oz)  10/30/15 58.5 kg (129 lb)  - back to baseline wgt.  PHYSICAL EXAM BP 126/76   Pulse 76   Ht 5\' 7"  (1.702 m)   Wt 60.7 kg (133 lb 12.8 oz)   BMI 20.96 kg/m  General appearance: alert, cooperative, appears stated age, no distress and Pleasant mood and affect HEENT: [place]/AT, EOMI, MMM, anicteric sclera Neck: no adenopathy, no carotid bruit, no JVD and supple, symmetrical, trachea midline Lungs: clear to auscultation bilaterally and normal percussion bilaterally Heart: Irregularly irregular, no  M./R./G., nondisplaced PMI. Abdomen: soft, non-tender; bowel sounds normal; no masses, no organomegaly Extremities: extremities normal, atraumatic, no cyanosis or edema Pulses: 2+ and symmetric Skin: Somewhat wrinkled patulous skin bilateral lower extremities -- daughter notes that this is was noted since the edema resolved. She has violaceous changes in her feet from dependent edema - mild venous stasis changes.    Adult ECG Report  Rate: 76 ;  Rhythm: atrial fibrillation and Poor R-wave progression suggesting possible anterolateral infarct, age undetermined.;   Narrative Interpretation: Relatively stable EKG.   Other studies Reviewed: Additional studies/ records that were reviewed today include:  Recent Labs:  Labs mostly followed by PCP Lab Results  Component Value Date   CREATININE 0.55 07/28/2015   No results found for: CHOL, HDL, LDLCALC, LDLDIRECT, TRIG, CHOLHDL   ASSESSMENT / PLAN: Problem List Items Addressed This Visit    Permanent atrial fibrillation [facility]): CHA2DS2-VASc Score 7 - on Xarelto (Chronic)    Totally asymptomatic. No further tachycardia with combination of carvedilol and diltiazem for rate control. No signs of bradycardia. No signs of bleeding on Xarelto.      Relevant Orders   EKG 12-Lead   Essential hypertension (Chronic)    Despite having stopped the ACE inhibitor, her blood pressure seems well controlled. Doing well. No need to change. No active heart failure symptoms, we don't need any additional afterload reduction.      Relevant Orders   EKG 12-Lead   Chronic diastolic CHF (congestive heart failure) ([place]) - Primary (Chronic)    She has baseline diastolic dysfunction that can be exacerbated to heart failure in the setting of A. fib RVR. Otherwise I don't really see that she has true heart failure. The edema does not seem to be associated with PND or orthopnea. Probably more related to venous stasis. Continue current dose of Lasix with when necessary  dosing. She is on a good dose of beta blocker.      Relevant Orders   EKG 12-Lead   Bilateral lower extremity edema    Still has trace edema. Continue current dose of Lasix. Discussed the possibility using support stockings.         Current medicines are reviewed at length with the patient  today. (+/- concerns) None The following changes have been made:  Patient Instructions  CONTINUE WITH CURRENT DOSE OF LASIX - FUROSEMIDE WITH THE WAY YOU ARE TAKING.   NO OTHER CHANGES    Your physician wants you to follow-up in [place]. - 30 MIN You will receive a reminder letter in the mail two months in advance. If you don't receive a letter, please call our office to schedule the follow-up appointment.   If you need a refill on your cardiac medications before your next appointment, please call your pharmacy.   Studies Ordered:   Orders Placed This Encounter  Procedures  . EKG 12-Lead      Glenetta Hew, M.D., M.S. Interventional Cardiologist   Pager # [phone] Phone # [phone] [address] [place], [place] [address]

## 2016-03-16 NOTE — Patient Instructions (Signed)
CONTINUE WITH CURRENT DOSE OF LASIX - FUROSEMIDE WITH THE WAY YOU ARE TAKING.   NO OTHER CHANGES    Your physician wants you to follow-up in [place]. - 30 MIN You will receive a reminder letter in the mail two months in advance. If you don't receive a letter, please call our office to schedule the follow-up appointment.   If you need a refill on your cardiac medications before your next appointment, please call your pharmacy.

## 2016-03-17 ENCOUNTER — Encounter: Payer: Self-pay | Admitting: Cardiology

## 2016-03-17 NOTE — Assessment & Plan Note (Signed)
She has baseline diastolic dysfunction that can be exacerbated to heart failure in the setting of A. fib RVR. Otherwise I don't really see that she has true heart failure. The edema does not seem to be associated with PND or orthopnea. Probably more related to venous stasis. Continue current dose of Lasix with when necessary dosing. She is on a good dose of beta blocker.

## 2016-03-17 NOTE — Assessment & Plan Note (Signed)
Totally asymptomatic. No further tachycardia with combination of carvedilol and diltiazem for rate control. No signs of bradycardia. No signs of bleeding on Xarelto.

## 2016-03-17 NOTE — Assessment & Plan Note (Signed)
Despite having stopped the ACE inhibitor, her blood pressure seems well controlled. Doing well. No need to change. No active heart failure symptoms, we don't need any additional afterload reduction.

## 2016-03-17 NOTE — Assessment & Plan Note (Signed)
Still has trace edema. Continue current dose of Lasix. Discussed the possibility using support stockings.

## 2016-03-20 DIAGNOSIS — M4316 Spondylolisthesis, lumbar region: Secondary | ICD-10-CM | POA: Diagnosis not present

## 2016-03-25 DIAGNOSIS — M4316 Spondylolisthesis, lumbar region: Secondary | ICD-10-CM | POA: Diagnosis not present

## 2016-03-27 DIAGNOSIS — M4316 Spondylolisthesis, lumbar region: Secondary | ICD-10-CM | POA: Diagnosis not present

## 2016-03-31 DIAGNOSIS — M4316 Spondylolisthesis, lumbar region: Secondary | ICD-10-CM | POA: Diagnosis not present

## 2016-04-02 DIAGNOSIS — Z0000 Encounter for general adult medical examination without abnormal findings: Secondary | ICD-10-CM | POA: Diagnosis not present

## 2016-04-02 DIAGNOSIS — Z681 Body mass index (BMI) 19 or less, adult: Secondary | ICD-10-CM | POA: Diagnosis not present

## 2016-04-02 DIAGNOSIS — Z23 Encounter for immunization: Secondary | ICD-10-CM | POA: Diagnosis not present

## 2016-04-03 DIAGNOSIS — M4316 Spondylolisthesis, lumbar region: Secondary | ICD-10-CM | POA: Diagnosis not present

## 2016-04-07 ENCOUNTER — Other Ambulatory Visit: Payer: Self-pay | Admitting: Neurology

## 2016-04-07 DIAGNOSIS — M4316 Spondylolisthesis, lumbar region: Secondary | ICD-10-CM | POA: Diagnosis not present

## 2016-04-10 DIAGNOSIS — M4316 Spondylolisthesis, lumbar region: Secondary | ICD-10-CM | POA: Diagnosis not present

## 2016-04-21 DIAGNOSIS — J069 Acute upper respiratory infection, unspecified: Secondary | ICD-10-CM | POA: Diagnosis not present

## 2016-04-21 DIAGNOSIS — R05 Cough: Secondary | ICD-10-CM | POA: Diagnosis not present

## 2016-04-21 DIAGNOSIS — Z6820 Body mass index (BMI) 20.0-20.9, adult: Secondary | ICD-10-CM | POA: Diagnosis not present

## 2016-04-27 DIAGNOSIS — M4316 Spondylolisthesis, lumbar region: Secondary | ICD-10-CM | POA: Diagnosis not present

## 2016-05-05 DIAGNOSIS — M4316 Spondylolisthesis, lumbar region: Secondary | ICD-10-CM | POA: Diagnosis not present

## 2016-05-15 ENCOUNTER — Ambulatory Visit (INDEPENDENT_AMBULATORY_CARE_PROVIDER_SITE_OTHER)
Admission: RE | Admit: 2016-05-15 | Discharge: 2016-05-15 | Disposition: A | Discharge status: Home / Self Care | Payer: Medicare Other | Source: Ambulatory Visit | Attending: Pulmonary Disease | Admitting: Pulmonary Disease

## 2016-05-15 DIAGNOSIS — R911 Solitary pulmonary nodule: Secondary | ICD-10-CM | POA: Diagnosis not present

## 2016-05-15 DIAGNOSIS — R918 Other nonspecific abnormal finding of lung field: Secondary | ICD-10-CM | POA: Diagnosis not present

## 2016-05-22 ENCOUNTER — Encounter: Payer: Self-pay | Admitting: Pulmonary Disease

## 2016-05-22 ENCOUNTER — Telehealth: Payer: Self-pay | Admitting: Pulmonary Disease

## 2016-05-22 ENCOUNTER — Other Ambulatory Visit: Payer: Self-pay | Admitting: Neurology

## 2016-05-22 ENCOUNTER — Ambulatory Visit (INDEPENDENT_AMBULATORY_CARE_PROVIDER_SITE_OTHER): Payer: Medicare Other | Admitting: Pulmonary Disease

## 2016-05-22 VITALS — BP 116/72 | HR 73 | Ht 67.0 in | Wt 131.8 lb

## 2016-05-22 DIAGNOSIS — R918 Other nonspecific abnormal finding of lung field: Secondary | ICD-10-CM

## 2016-05-22 DIAGNOSIS — Z8701 Personal history of pneumonia (recurrent): Secondary | ICD-10-CM | POA: Diagnosis not present

## 2016-05-22 MED ORDER — MEMANTINE HCL 10 MG PO TABS: 10.0000 mg | ORAL_TABLET | Freq: Two times a day (BID) | ORAL | 0 refills | Status: DC

## 2016-05-22 NOTE — Patient Instructions (Signed)
   Call me if you have any new breathing problems or questions.  I will be happy to see you back if needed.

## 2016-05-22 NOTE — Progress Notes (Signed)
Subjective:    Patient ID: Megan Munoz, female    DOB: 1932/07/21, 83 y.o.   MRN: [id number]  C.C.:  Follow-up with Bilateral Lung Nodules & Recurrent Pneumonia.   HPI  Bilateral lung nodules: Seen on previous CT imaging. Patient's previous right sided nodule had resolved but now she has 2 upper lobe groundglass nodules with stable left lower lobe nodules.  Recurrent pneumonia: Last hospitalized in May 2017 and treated. No episodes of pneumonia since last appointment.   Review of Systems No chest pain or pressure. No wheezing or dyspnea. Does have intermittent coughing & clears her throat. No fever or chills.   Allergies  Allergen Reactions  . Tetanus Toxoids Other (See Comments)    Told never to take     Current Outpatient Prescriptions on File Prior to Visit  Medication Sig Dispense Refill  . albuterol (ACCUNEB) 0.63 MG/3ML nebulizer solution Take 3 mLs by nebulization every 6 (six) hours as needed for wheezing or shortness of breath. Reported on 08/21/2015  0  . B Complex Vitamins (VITAMIN B COMPLEX PO) Take 1 tablet by mouth daily.     . carvedilol (COREG) 12.5 MG tablet Take 1 tablet (12.5 mg total) by mouth 2 (two) times daily with a meal. 180 tablet 3  . Cholecalciferol (VITAMIN D3 SUPER STRENGTH) 2000 units CAPS Take by mouth.    . diltiazem (DILTIAZEM CD) 120 MG 24 hr capsule Take 3 capsules (360 mg total) by mouth daily. 360 capsule 1  . diphenhydrAMINE (SOMINEX) 25 MG tablet Take 50 mg by mouth at bedtime.     . feeding supplement, ENSURE ENLIVE, (ENSURE ENLIVE) LIQD Take 237 mLs by mouth 3 (three) times daily between meals. 237 mL 12  . ferrous sulfate 325 (65 FE) MG tablet Take 325 mg by mouth daily with breakfast.    . FLUoxetine (PROZAC) 20 MG capsule Take 20 mg by mouth daily.    . furosemide (LASIX) 20 MG tablet Take 1 tablet (20 mg total) by mouth daily as needed for fluid or edema. 30 tablet 6  . loratadine (CLARITIN) 10 MG tablet Take 10 mg by mouth daily.    .  Melatonin 10 MG TABS Take 10 mg by mouth at bedtime.     . memantine (NAMENDA) 10 MG tablet Take 1 tablet (10 mg total) by mouth 2 (two) times daily. 180 tablet 3  . Multiple Vitamin (MULTIVITAMIN) capsule Take 1 capsule by mouth daily.    . primidone (MYSOLINE) 250 MG tablet Take 125 mg by mouth 2 (two) times daily.     . rivaroxaban (XARELTO) 20 MG TABS tablet Take 20 mg by mouth daily with supper.    . simvastatin (ZOCOR) 40 MG tablet Take 40 mg by mouth daily.    . VENTOLIN HFA 108 (90 Base) MCG/ACT inhaler Inhale 2 puffs into the lungs daily as needed. Reported on 08/21/2015  0   No current facility-administered medications on file prior to visit.     Past Medical History:  Diagnosis Date  . Anxiety   . Atherosclerosis of aortic arch [facility]) April 2017   Noted on CT chest, calcified aortic atherosclerosis at the arch.  . Atrial fibrillation, permanent ([place]) 03/2013   Previously on Atenolol for Rate - now on combination diltiazem and carvedilol for rate Control;  & Xarelto for AC. CHADS2VASC2 = 7.  . Cerebral infarction [facility]) 10/2008   MRI Brain 05/2014 for near syncope revealed: No acure infarct.  Moderate to large remote left  frontal lobe infarct with encephalomalacia and dilation of the frontal horn of the left lateral ventricle.   Mild small vessel disease type changes.  No intracranial hemorrhage.  Global atrophy.   . CHF (congestive heart failure) ([place])   . Coronary atherosclerosis due to calcified coronary lesion April 2017   Noted on CT chest  . Hyperlipemia   . Hypertension   . Tinea unguium   . Tremor     Past Surgical History:  Procedure Laterality Date  . BLADDER SURGERY    . CARPAL TUNNEL RELEASE Left   . FOOT SURGERY Right   . HERNIA REPAIR    . NASAL SINUS SURGERY    . TOTAL ABDOMINAL HYSTERECTOMY      Family History  Problem Relation Age of Onset  . Osteoporosis Mother   . Emphysema Father   . Lung disease Father     Black Lung  . Stroke Brother   . Cancer  Maternal Grandmother   . Emphysema Paternal Grandmother   . Asthma Daughter     allergy induced    Social History   Social History  . Marital status: Widowed    Spouse name: N/A  . Number of children: 1  . Years of education: 44   Occupational History  . Retired    Social History Main Topics  . Smoking status: Former Smoker    Packs/day: 1.00    Years: 16.00    Start date: 03/30/1954    Quit date: 03/30/1970  . Smokeless tobacco: Never Used     Comment: smoked 1/2-1 ppd  . Alcohol use No  . Drug use: No  . Sexual activity: Not Asked   Other Topics Concern  . None   Social History Narrative   Widow.  Lives with her daughter & her family.  (Son-in-law & 5 Grandsons).   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   2-4 cups caffeine/day   1-2 glassess of wine/beer  Per week.  Never smoked.      [facility]:   Originally from [place]. She lived in [place], [place], [place], & [place]. 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 No bird exposure. No mold or hot tub exposure.       Objective:   Physical Exam BP 116/72 (BP Location: Left Arm, Patient Position: Sitting, Cuff Size: Normal)   Pulse 73   Ht 5\' 7"  (1.702 m)   Wt 131 lb 12.8 oz (59.8 kg)   SpO2 97%   BMI 20.64 kg/m  Gen.: Elderly female. Comfortable. No distress. Accompanied by daughter today. Integument: Warm and dry. No rash on exposed skin. HEENT: Moist mucous membranes. No scleral icterus. No oral ulcers. Cardiovascular: Regular rate. No edema. No JVD. Lymphatics: No appreciated cervical or supraclavicular lymphadenopathy. Pulmonary: Normal work of breathing on room air. Clear with auscultation bilaterally. Abdomen: Soft. Nondistended. Normal bowel sounds.  IMAGING IMAGING CT CHEST W/O 05/15/16 (personally reviewed by me): Tiny, subcentimeter left lower lobe nodules unchanged with largest measuring 3 mm. Previous right lower lobe groundglass nodule has resolved. 11  mm right upper lobe groundglass nodule and 6 mm left upper lobe groundglass nodule are both new. No pleural effusion or thickening. No pathologic mediastinal adenopathy. No pericardial effusion.  ESOPHAGRAM/BARIUM SWALLOW 12/16/15 (per radiologist): Nonspecific esophageal motility disorder. No evidence of laryngeal penetration or aspiration. No acute findings.  CT CHEST W/O 11/01/15 (previously reviewed by me):  No pleural effusion. Bilateral apical pleural thickening and  parenchymal linear opacity consistent with scar formation. No pathologic mediastinal adenopathy with significant improvement since previously noted. Significant improvement in bilateral opacities with 3 mm left lower lobe nodule, 5 mm groundglass nodule in lingula, & 8 mm groundglass right lower lobe nodule noted. No pericardial effusion.  PORT CXR 07/30/15 (previously reviewed by me): Some improvement in bilateral opacities that appeared to be predominantly apical. Pleural thickening is noted. Blunting of right costophrenic angle with suspected residual pleural effusion and improvement in what appears to be left pleural effusion.  CT CHEST W/ 07/24/15 (previously reviewed by me): Emphysema noted. Patchy bilateral parenchymal opacities especially in the apices with some groundglass. Moderate right and small left pleural effusion. Pleural thickening noted along the apices bilaterally. Area of consolidation versus mass in right apex noted. Mediastinal lymphadenopathy noted with largest measuring 1.2 cm precarinal. No pericardial effusion.    Assessment & Plan:  83 y.o. bilateral lung nodules & recurrent pneumonia. I reviewed the patient's CT imaging with both she and her daughter who was present. We discussed the fact that she would not want any form of treatment for a new or developing cancer. Given the fact that these nodules are predominantly groundglass it's highly probable that this represents inflammation rather than a developing  malignancy. I did explain that this cannot be totally ruled out at this time. As repeat CT imaging will not change the patient's plan of treatment we're deferring further imaging. I instructed the patient to contact my office if she develops any new breathing problems or has questions before her next appointment.  1. Bilateral lung nodules: Holding off on further imaging as per patient's wishes that she would not wish to undergo treatment anyway for potential developing cancer. 2. Recurrent pneumonia: Appropriately immunized. No evidence of recurrence. 3. Health maintenance:  Status post flu and pneumonia vaccines in September 2017.  4. Follow-up: Return to clinic as needed.  Sonia Baller Ashok Cordia, M.D. [facility] Pulmonary & Critical Care Pager:  [phone] After 3pm or if no response, call [phone] 9:34 AM 05/22/16

## 2016-05-22 NOTE — Telephone Encounter (Signed)
IMAGING CT CHEST W/O 05/15/16 (personally reviewed by me): Tiny, subcentimeter left lower lobe nodules unchanged with largest measuring 3 mm. Previous right lower lobe groundglass nodule has resolved. 11 mm right upper lobe groundglass nodule and 6 mm left upper lobe groundglass nodule are both new. No pleural effusion or thickening. No pathologic mediastinal adenopathy. No pericardial effusion.  ESOPHAGRAM/BARIUM SWALLOW 12/16/15 (per radiologist): Nonspecific esophageal motility disorder. No evidence of laryngeal penetration or aspiration. No acute findings.

## 2016-05-22 NOTE — Telephone Encounter (Signed)
Patients daughter Maudie Mercury (listed on DPR) has called office scheduled a fu visit with Dr. Krista Blue for 07/21/16, but is requesting if patient is able to have a 1 time refill for memantine (NAMENDA) 10 MG tablet.  [place].

## 2016-05-22 NOTE — Addendum Note (Signed)
Addended by: Minna Antis on: 05/22/2016 11:49 AM   Modules accepted: Orders

## 2016-06-02 DIAGNOSIS — I10 Essential (primary) hypertension: Secondary | ICD-10-CM | POA: Diagnosis not present

## 2016-06-02 DIAGNOSIS — Z79899 Other long term (current) drug therapy: Secondary | ICD-10-CM | POA: Diagnosis not present

## 2016-06-04 DIAGNOSIS — Z681 Body mass index (BMI) 19 or less, adult: Secondary | ICD-10-CM | POA: Diagnosis not present

## 2016-06-04 DIAGNOSIS — I10 Essential (primary) hypertension: Secondary | ICD-10-CM | POA: Diagnosis not present

## 2016-06-04 DIAGNOSIS — Z23 Encounter for immunization: Secondary | ICD-10-CM | POA: Diagnosis not present

## 2016-06-22 ENCOUNTER — Other Ambulatory Visit: Payer: Self-pay | Admitting: Cardiology

## 2016-07-12 ENCOUNTER — Other Ambulatory Visit: Payer: Self-pay | Admitting: Cardiology

## 2016-07-21 ENCOUNTER — Encounter (INDEPENDENT_AMBULATORY_CARE_PROVIDER_SITE_OTHER): Payer: Self-pay

## 2016-07-21 ENCOUNTER — Ambulatory Visit (INDEPENDENT_AMBULATORY_CARE_PROVIDER_SITE_OTHER): Payer: Medicare Other | Admitting: Neurology

## 2016-07-21 ENCOUNTER — Encounter: Payer: Self-pay | Admitting: Neurology

## 2016-07-21 VITALS — Ht 67.0 in | Wt 133.0 lb

## 2016-07-21 DIAGNOSIS — F0390 Unspecified dementia without behavioral disturbance: Secondary | ICD-10-CM | POA: Insufficient documentation

## 2016-07-21 DIAGNOSIS — Z8673 Personal history of transient ischemic attack (TIA), and cerebral infarction without residual deficits: Secondary | ICD-10-CM

## 2016-07-21 DIAGNOSIS — R269 Unspecified abnormalities of gait and mobility: Secondary | ICD-10-CM | POA: Diagnosis not present

## 2016-07-21 DIAGNOSIS — I482 Chronic atrial fibrillation: Secondary | ICD-10-CM | POA: Diagnosis not present

## 2016-07-21 DIAGNOSIS — I4821 Permanent atrial fibrillation: Secondary | ICD-10-CM

## 2016-07-21 MED ORDER — MEMANTINE HCL 10 MG PO TABS: 10.0000 mg | ORAL_TABLET | Freq: Two times a day (BID) | ORAL | 4 refills | Status: DC

## 2016-07-21 MED ORDER — DONEPEZIL HCL 10 MG PO TABS: 10.0000 mg | ORAL_TABLET | Freq: Every day | ORAL | 11 refills | Status: DC

## 2016-07-21 NOTE — Progress Notes (Signed)
Chief Complaint  Patient presents with  . Cerebrovascular Accident    MMSE 24/30 - 4 animals.  She is here with her daughter, [name], who feels her mother's memory is mildly worse.  . Tremors    She has continued taking Primidone 125mg  BID.  She still has bilateral hand tremors but feels they are tolerable.  . Insomnia    Melatonin 10mg  at bedime has been helpful for her insomnia.      PATIENT: Megan Munoz DOB: Nov 30, 1932  HISTORICAL  Megan Munoz is a 83 yo RH , female, is accompanied by her daughter [name], referred by her primary care physician Dr Ernie Hew for continued neurological care for her history of stroke, essential tremor,  She had a history of hypertension, hyperlipidemia, diagnosis of atrial fibrillation since 2015, is taking Xarelto  She suffered left frontal, basal ganglion stroke in 2010, presented with mild confusion, slow processing time, initially was treated with aspirin, since her diagnosis of atrial fibrillation in 2015, she was started on anticoagulation Xarelto  She moved in with her daughter recently, no longer driving, but independent in her daily activity, doing laundry, ambulate without assistance, mild memory trouble today's Mini-Mental Status Examination is 26 out of 30  She had long-standing history of gradual onset bilateral hands tremor, was diagnosed with essential tremor, acute worsening since her stroke in 2010, they have tried variable dose of primidone up to 750 mg daily, currently taking 250 mg twice a day, which works out best for her, she still has posturing tremor of her bilateral hands  UPDATE Dec 11 2014: She is with her daughter [name] at today's clinical visit, continue has mild memory trouble, unsteady gait,  I have reviewed MRI report from outside hospital February 2016 1.  No MR evidence of an acute intracranial abnormality. 2.  Sequela of previous left frontal infarct (with resultant cystic encephalomalacia with surrounding  gliosis). 3.  Age-appropriate global cerebral volume loss and background microvascular disease. 4.  No MR evidence of abnormal intracranial enhancement. 5.  Bilateral maxillary sinus disease with small fluid levels suggesting an acute sinusitis.  Her mother developed dementia at age 54s,  She continue has essential tremor, lateral hands shaking, previously tried primidone up to 250 mg 3 times a day without eliminating her left hand tremor, she is using her left hand for utensil, mild shaking, bothers her family more than herself  UPDATE July 21 2016: She was diagnosed with dementia, likely central nervous system degenerative disorder with vascular component,  We personally reviewed MRI of the brain 2016, evidence of profound atrophy, left frontal encephalomalacia, atrophy of left mesial temporal lobe,  She had 15 years of education retired as a Network engineer for a [employer], she is now living with her mother. She is no longer cooking, increase mild unsteady gait,  REVIEW OF SYSTEMS: Full 14 system review of systems performed and notable only for back pain, memory loss.  ALLERGIES: Allergies  Allergen Reactions  . Tetanus Toxoids Other (See Comments)    Told never to take     HOME MEDICATIONS: Current Outpatient Prescriptions  Medication Sig Dispense Refill  . alendronate (FOSAMAX) 70 MG tablet Take 70 mg by mouth once a week. Take with a full glass of water on an empty stomach.    Marland Kitchen atenolol (TENORMIN) 25 MG tablet Take by mouth 2 (two) times daily.     . B Complex Vitamins (VITAMIN B COMPLEX PO) Take by mouth daily.    . Calcium Carb-Cholecalciferol (CALCIUM +  D3) 600-200 MG-UNIT TABS Take by mouth daily.    . diphenhydrAMINE (SOMINEX) 25 MG tablet Take 50 mg by mouth at bedtime as needed for sleep.     . ferrous sulfate 325 (65 FE) MG tablet Take 325 mg by mouth daily with breakfast.    . Flaxseed, Linseed, (EQL FLAX SEED OIL) 1000 MG CAPS Take by mouth daily.    Marland Kitchen  FLUoxetine (PROZAC) 20 MG capsule Take 20 mg by mouth daily.    Marland Kitchen lisinopril (PRINIVIL,ZESTRIL) 20 MG tablet Take 20 mg by mouth. 0.5 tab daily    . loratadine (CLARITIN) 10 MG tablet Take 10 mg by mouth daily.    . Melatonin 10 MG TABS Take by mouth daily.    . Multiple Vitamin (MULTIVITAMIN) capsule Take 1 capsule by mouth daily.    . primidone (MYSOLINE) 250 MG tablet Take 250 mg by mouth 2 (two) times daily as needed.     . rivaroxaban (XARELTO) 20 MG TABS tablet Take 20 mg by mouth daily with supper.    . simvastatin (ZOCOR) 40 MG tablet Take 40 mg by mouth daily.       PAST MEDICAL HISTORY: Past Medical History:  Diagnosis Date  . Anxiety   . Atherosclerosis of aortic arch [facility]) April 2017   Noted on CT chest, calcified aortic atherosclerosis at the arch.  . Atrial fibrillation, permanent ([place]) 03/2013   Previously on Atenolol for Rate - now on combination diltiazem and carvedilol for rate Control;  & Xarelto for AC. CHADS2VASC2 = 7.  . Cerebral infarction [facility]) 10/2008   MRI Brain 05/2014 for near syncope revealed: No acure infarct.  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   Mild small vessel disease type changes.  No intracranial hemorrhage.  Global atrophy.   . CHF (congestive heart failure) ([place])   . Coronary atherosclerosis due to calcified coronary lesion April 2017   Noted on CT chest  . Hyperlipemia   . Hypertension   . Tinea unguium   . Tremor     PAST SURGICAL HISTORY: Past Surgical History  Procedure Laterality Date  . Carpal tunnel release Left   . Total abdominal hysterectomy    . Foot surgery Right   . Bladder surgery    . Hernia repair    . Nasal sinus surgery      FAMILY HISTORY: Family History  Problem Relation Age of Onset  . Osteoporosis Mother   . Emphysema Father   . Lung disease Father     Black Lung  . Stroke Brother   . Cancer Maternal  Grandmother   . Emphysema Paternal Grandmother   . Asthma Daughter     allergy induced    SOCIAL HISTORY:  Social History   Social History  . Marital status: Widowed    Spouse name: N/A  . Number of children: 1  . Years of education: 40   Occupational History  . Retired    Social History Main Topics  . Smoking status: Former Smoker    Packs/day: 1.00    Years: 16.00    Start date: 03/30/1954    Quit date: 03/30/1970  . Smokeless tobacco: Never Used     Comment: smoked 1/2-1 ppd  . Alcohol use No  . Drug use: No  . Sexual activity: Not on file   Other Topics Concern  . Not on file   Social History Narrative  Widow.  Lives with her daughter & her family.  (Son-in-law & 5 Grandsons).   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   2-4 cups caffeine/day   1-2 glassess of wine/beer  Per week.  Never smoked.      [facility]:   Originally from [place]. She lived in [place], [place], [place], & [place]. 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 No bird exposure. No mold or hot tub exposure.      PHYSICAL EXAM   Vitals:   07/21/16 0910  Weight: 133 lb (60.3 kg)  Height: 5\' 7"  (1.702 m)    Not recorded      Body mass index is 20.83 kg/m.  PHYSICAL EXAMNIATION:  Gen: NAD, conversant, well nourised, obese, well groomed                     Cardiovascular: Regular rate rhythm, no peripheral edema, warm, nontender. Eyes: Conjunctivae clear without exudates or hemorrhage Neck: Supple, no carotid bruise. Pulmonary: Clear to auscultation bilaterally   NEUROLOGICAL EXAM:  MMSE - Mini Mental State Exam 07/21/2016 12/11/2014 05/25/2014  Orientation to time 4 4 5   Orientation to Place 5 4 4   Registration 3 3 3   Attention/ Calculation 3 5 5   Recall 0 0 0  Language- name 2 objects 2 2 2   Language- repeat 1 1 1   Language- follow 3 step command 3 3 3   Language- read & follow direction 1 1 1   Write a sentence 1 1 1   Copy design  1 1 1   Total score 24 25 26   Animal Naming  4  CRANIAL NERVES: CN II: Visual fields are full to confrontation. Fundoscopic exam is normal with sharp discs and no vascular changes. Pupils were equal round reactive to light CN III, IV, VI: extraocular movement are normal. No ptosis. CN V: Facial sensation is intact to pinprick in all 3 divisions bilaterally. Corneal responses are intact.  CN VII: Face is symmetric with normal eye closure and smile. CN VIII: Hearing is normal to rubbing fingers CN IX, X: Palate elevates symmetrically. Phonation is normal. CN XI: Head turning and shoulder shrug are intact CN XII: Tongue is midline with normal movements and no atrophy.  MOTOR: Mild posturing tremor, no significant weakness, rigidity or bradykinesia,  REFLEXES: Reflexes are 2+ and symmetric at the biceps, triceps, knees, and ankles. Plantar responses are flexor.  SENSORY: Mildly length dependent decreased light touch, pinprick to ankle level, decreased vibratory sensation to ankle.  COORDINATION: Rapid alternating movements and fine finger movements are intact. There is no dysmetria on finger-to-nose and heel-knee-shin. There are no abnormal or extraneous movements.   GAIT/STANCE: She tends to guard her lumbar paraspinal muscles, needs to push up to get up from seated position, wide based, cautious, Romberg is absent.   DIAGNOSTIC DATA (LABS, IMAGING, TESTING) - I reviewed patient records, labs, notes, testing and imaging myself where available   ASSESSMENT AND PLAN  Saman Giddens is a 83 y.o. female  with vascular risk factor of hypertension, hyperlipidemia, atrial fibrillation, on Xarelto treatment,   Dementia without behavioral issues  Mini-Mental Status Examination is 24 out of 30  Central nervous system degenerative disorder, vascular component  Continue Namenda 10 mg twice a day, Aricept 10 mg daily  Essential tremor  Will try to lower the dose of primidone even try to stop  it, to decrease the potential side effect  Insomnia  Low-dose of melatonin  Decrease daily benadryl use  Gait difficulty:  Multifactorial, aging, deconditioning periventricular small vessel disease  Encourage her keep moderate exercise,   Refer her to physical therapy Stroke  Left frontal encephalomalacia, history of atrial fibrillation, on Xarelto treatment,  She is at high risk for developing partial seizure,  I educate daughter about potential symptoms of partial seizure   Marcial Pacas, M.D. Ph.D.  [facility] [address] [place], [place] [address] Ph: [phone] Fax: [phone]

## 2016-07-21 NOTE — Patient Instructions (Signed)
Epilepsy Foundation, partial seizure  https://www.epilepsy.com/learn/types-seizures/focal-onset-aware-seizures-aka-simple-partial-seizures  https://www.epilepsy.com/learn/diagnosis/medical-history  Telling what happened The medical history is the foundation of the diagnosis of epilepsy. The doctor needs ALL the information about what happened before, during, and after your seizures. If you cannot give enough information, then others who have seen the seizures happen should contribute what they know. If some of the details are vague, the doctor needs to know that too.  What will the doctor ask? To get a full picture of what the spell was like and what might have caused it, the doctor probably will ask about things that happened before and after it as well as during the incident itself.  Before The Spell: Did you experience lack of sleep or unusual stress? Were you sick recently? Did you take any kind of medication, including over-the-counter medicines, alcohol, or illegal drugs? What were you doing immediately before the event (for instance, lying down, sitting, standing, getting up from a lying position, heavy exercise)? During The Spell: What time of day was it? Were you just waking up or falling asleep? How did it begin? Was there a warning? Did your eyes, mouth, face, head, arms, or legs move abnormally? Were you able to talk and respond appropriately? Did you lose control of your bladder or bowels? Did you bite your tongue or the inside of your cheeks? After The Spell: Were you confused or tired? Could you speak normally? Did you have a headache? Did your muscles ache? How can my family and friends help? One of the most valuable pieces of information a doctor can get is an accurate eyewitness description of a typical event. It is worthwhile for people who have seen your seizures to visit the doctor's office with you. If that is not practical, perhaps the doctor or nurse can speak  with them by telephone.  Ask them to write down a detailed description of what they saw soon after the event, because memories fade with time. Even after they talk to your doctor, save these notes, because they may be helpful to another doctor later. If the episodes occur often, it would be very helpful for the doctor if someone could film one or more of them on a cell phone or home video. Your parents or other family members also may be able to help with some important questions about your medical history:  Was your birth difficult? Did you have any seizures with high fevers when you were a baby or small child? Did you ever have a head injury? If so, did you lose consciousness after the injury? How long were you unconscious? Were you taken to a hospital? Did you ever have meningitis (an infection of the membranes around the brain and spinal cord) or encephalitis (a serious viral infection of the brain)? Has anyone else in the family had epilepsy, any other neurological disorder, or a disorder associated with loss of consciousness? What else should I tell the doctor about? If the incidents occur repeatedly, try to identify associated factors. For example, some women with epilepsy have more frequent episodes at certain times in their menstrual cycle, so you may want to track this kind of relationship on a calendar. Some people try to link their seizures with environmental factors such as stress, using an antibiotic, or eating too much sugar. Often these associations are just coincidences. Keep careful records of when the possible factor occurs in relation to the time and frequency of your seizures. And remember that even if the factor and your  seizures turn out to be associated, that doesn't necessarily mean that the factor is causing the seizures.

## 2016-07-22 DIAGNOSIS — I69398 Other sequelae of cerebral infarction: Secondary | ICD-10-CM | POA: Diagnosis not present

## 2016-07-22 DIAGNOSIS — R2689 Other abnormalities of gait and mobility: Secondary | ICD-10-CM | POA: Diagnosis not present

## 2016-07-24 DIAGNOSIS — I69398 Other sequelae of cerebral infarction: Secondary | ICD-10-CM | POA: Diagnosis not present

## 2016-07-24 DIAGNOSIS — R2689 Other abnormalities of gait and mobility: Secondary | ICD-10-CM | POA: Diagnosis not present

## 2016-07-29 DIAGNOSIS — I69398 Other sequelae of cerebral infarction: Secondary | ICD-10-CM | POA: Diagnosis not present

## 2016-07-29 DIAGNOSIS — R2689 Other abnormalities of gait and mobility: Secondary | ICD-10-CM | POA: Diagnosis not present

## 2016-07-31 DIAGNOSIS — I69398 Other sequelae of cerebral infarction: Secondary | ICD-10-CM | POA: Diagnosis not present

## 2016-07-31 DIAGNOSIS — R2689 Other abnormalities of gait and mobility: Secondary | ICD-10-CM | POA: Diagnosis not present

## 2016-08-05 DIAGNOSIS — R2689 Other abnormalities of gait and mobility: Secondary | ICD-10-CM | POA: Diagnosis not present

## 2016-08-05 DIAGNOSIS — I69398 Other sequelae of cerebral infarction: Secondary | ICD-10-CM | POA: Diagnosis not present

## 2016-08-12 DIAGNOSIS — R2689 Other abnormalities of gait and mobility: Secondary | ICD-10-CM | POA: Diagnosis not present

## 2016-08-12 DIAGNOSIS — I69398 Other sequelae of cerebral infarction: Secondary | ICD-10-CM | POA: Diagnosis not present

## 2016-08-27 ENCOUNTER — Other Ambulatory Visit: Payer: Self-pay | Admitting: Neurology

## 2016-09-04 ENCOUNTER — Encounter (INDEPENDENT_AMBULATORY_CARE_PROVIDER_SITE_OTHER): Payer: Self-pay

## 2016-09-04 ENCOUNTER — Encounter: Payer: Self-pay | Admitting: Cardiology

## 2016-09-04 ENCOUNTER — Ambulatory Visit (INDEPENDENT_AMBULATORY_CARE_PROVIDER_SITE_OTHER): Payer: Medicare Other | Admitting: Cardiology

## 2016-09-04 VITALS — BP 132/60 | HR 67 | Ht 67.0 in | Wt 132.0 lb

## 2016-09-04 DIAGNOSIS — R600 Localized edema: Secondary | ICD-10-CM

## 2016-09-04 DIAGNOSIS — I5032 Chronic diastolic (congestive) heart failure: Secondary | ICD-10-CM

## 2016-09-04 DIAGNOSIS — I482 Chronic atrial fibrillation: Secondary | ICD-10-CM | POA: Diagnosis not present

## 2016-09-04 DIAGNOSIS — I10 Essential (primary) hypertension: Secondary | ICD-10-CM

## 2016-09-04 DIAGNOSIS — I4821 Permanent atrial fibrillation: Secondary | ICD-10-CM

## 2016-09-04 NOTE — Patient Instructions (Signed)
Medication   -- take LASIX ( FUROSEMIDE) 3 DAYS A WEEK -- MAY TRY Monday - Wednesday -Friday FOR 2 WEEKS THEN RETURN TO TWICE A WEEK    Your physician wants you to follow-up in 12 MONTHS WITH DR HARDING. You will receive a reminder letter in the mail two months in advance. If you don't receive a letter, please call our office to schedule the follow-up appointment.   If you need a refill on your cardiac medications before your next appointment, please call your pharmacy.

## 2016-09-04 NOTE — Progress Notes (Signed)
PCP: Fanny Bien, MD  Clinic Note: Chief Complaint  Patient presents with  . Edema    pt states in both feet, more in the right   . Follow-up    Atrial fibrillation    HPI: Megan Munoz is a 83 y.o. female with a PMH below who presents today for Follow-up for atrial fibrillation.  Megan Munoz was last seen in Dec 2017 - she is doing relatively well. No real symptoms of A. fib. She was reluctant to take any additional doses of Lasix although she has had some edema. Remains oblivious of being in A. fib.  Recent Hospitalizations: None --she and her daughter happy to report that she has not had any hospitalizations or illnesses throughout the entire winter and spring.  Studies Personally Reviewed - (if available, images/films reviewed: From Epic Chart or Care Everywhere)  None  Interval History: Megan Munoz presents today again with her daughter overall doing very well. Again she is totally asymptomatic for the A. fib. She never notes any rapid heartbeats. She has not had any lightheadedness, dizziness or wooziness. No syncope or near syncope. No chest tightness or pressure with rest or exertion. No exertional dyspnea. No PND or orthopnea, but she does have bilateral (R>L LE) edema. She has not been taking Lasix anymore than twice a week on Monday and Thursday. She has not been using when necessary dosing.   No TIA/amaurosis fugax symptoms. No melena, hematochezia, hematuria, or epstaxis. No easy bruising on Xarelto No claudication.  ROS: A comprehensive was performed. Review of Systems  Constitutional: Negative for malaise/fatigue.  Respiratory: Negative for cough, shortness of breath and wheezing.   Musculoskeletal: Negative for myalgias.  Skin: Negative.   Neurological: Positive for tremors.  Psychiatric/Behavioral: Positive for memory loss. The patient is not nervous/anxious.   All other systems reviewed and are negative.   I have reviewed and (if needed) personally updated the  patient's problem list, medications, allergies, past medical and surgical history, social and family history.   Past Medical History:  Diagnosis Date  . Anxiety   . Atherosclerosis of aortic arch [facility]) April 2017   Noted on CT chest, calcified aortic atherosclerosis at the arch.  . Atrial fibrillation, permanent ([place]) 03/2013   Previously on Atenolol for Rate - now on combination diltiazem and carvedilol for rate Control;  & Xarelto for AC. CHADS2VASC2 = 7.  . Cerebral infarction [facility]) 10/2008   MRI Brain 05/2014 for near syncope revealed: No acure infarct.  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   Mild small vessel disease type changes.  No intracranial hemorrhage.  Global atrophy.   . CHF (congestive heart failure) ([place])   . Coronary atherosclerosis due to calcified coronary lesion April 2017   Noted on CT chest  . Hyperlipemia   . Hypertension   . Tinea unguium   . Tremor     Past Surgical History:  Procedure Laterality Date  . BLADDER SURGERY    . CARPAL TUNNEL RELEASE Left   . FOOT SURGERY Right   . HERNIA REPAIR    . NASAL SINUS SURGERY    . TOTAL ABDOMINAL HYSTERECTOMY      No outpatient prescriptions have been marked as taking for the 09/04/16 encounter (Office Visit) with Megan Man, MD.    Allergies  Allergen Reactions  . Tetanus Toxoids Other (See Comments)    Told never to take  Social History   Social History  . Marital status: Widowed    Spouse name: N/A  . Number of children: 1  . Years of education: 75   Occupational History  . Retired    Social History Main Topics  . Smoking status: Former Smoker    Packs/day: 1.00    Years: 16.00    Start date: 03/30/1954    Quit date: 03/30/1970  . Smokeless tobacco: Never Used     Comment: smoked 1/2-1 ppd  . Alcohol use No  . Drug use: No  . Sexual activity: Not Asked   Other Topics Concern  . None    Social History Narrative   Widow.  Lives with her daughter & her family.  (Son-in-law & 5 Grandsons).   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   2-4 cups caffeine/day   1-2 glassess of wine/beer  Per week.  Never smoked.      [facility]:   Originally from [place]. She lived in [place], [place], [place], & [place]. 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 No bird exposure. No mold or hot tub exposure.     family history includes Asthma in her daughter; Cancer in her maternal grandmother; Emphysema in her father and paternal grandmother; Lung disease in her father; Osteoporosis in her mother; Stroke in her brother.  Wt Readings from Last 3 Encounters:  09/04/16 132 lb (59.9 kg)  07/21/16 133 lb (60.3 kg)  05/22/16 131 lb 12.8 oz (59.8 kg)    PHYSICAL EXAM BP 132/60   Pulse 67   Ht 5\' 7"  (1.702 m)   Wt 132 lb (59.9 kg)   BMI 20.67 kg/m  General appearance: alert, cooperative, appears stated age, no distress. Well-nourished, well-groomed HEENT: [place]/AT, EOMI, MMM, anicteric sclera Neck: no adenopathy, no carotid bruit and no JVD Lungs: clear to auscultation bilaterally, normal percussion bilaterally and non-labored Heart: Irregularly irregular rhythm with normal rate. S1 &S2 normal, no murmur, click, rub or gallop; nondisplaced PMI Abdomen: soft, non-tender; bowel sounds normal; no masses,  no organomegaly; no HJR Extremities: extremities normal, atraumatic, no cyanosis, and 2+ R> 1+ L LE edema mild V stasis changes (R leg has old wound on the shin) Pulses: 2+ and symmetric;  Skin: mobility and turgor normal and BLE - V stasis changes, no singinficant Varicose Vs.  Neurologic: Mental status: Alert & oriented x 3, thought content appropriate; non-focal exam.  Pleasant mood & affect. She does have a notable tremor on the right arm/hand.    Adult ECG Report  Rate: 67 ;  Rhythm: atrial fibrillation and Controlled ventricular rate. Left  axis deviation (847). Anteroseptal MI, age undetermined. Nonspecific ST and T-wave changes  Consider inferior ischemia.- ; otherwise normal axis, intervals and durations.  Narrative Interpretation: Stable EKG   Other studies Reviewed: Additional studies/ records that were reviewed today include:  Recent Labs:  No results found for: CHOL, HDL, LDLCALC, LDLDIRECT, TRIG, CHOLHDL - followed by PCP   ASSESSMENT / PLAN: Problem List Items Addressed This Visit    Bilateral lower extremity edema    Edema is probably more related to venous stasis. Recommended increasing frequency of Lasix dosing. Also recommended support stockings.      Chronic diastolic CHF (congestive heart failure) (HCC) (Chronic)    Baseline diastolic dysfunction but no actual heart failure symptoms. I think the edema is more related to venous stasis than diastolic dysfunction. She is on good dose of carvedilol. We  have stopped her ARB in the past. She is on when necessary Lasix only which will increase to 3 days a week for the next few weeks. I then instructed them to have her take her Lasix at least twice a week, but if swelling is getting worse, she should take at least another day or so in the course of a week.      Relevant Orders   EKG 12-Lead   Essential hypertension (Chronic)    Well-controlled despite being off ACE inhibitor. No active heart failure symptoms.      Permanent atrial fibrillation ([place]): CHA2DS2-VASc Score 7 - on Xarelto - Primary (Chronic)    Permanent persistent atrial fibrillation. Totally asymptomatic. No suggestion of rapid rate. She is on moderate dose beta blocker with no bradycardia symptoms. Anticoagulated with Xarelto. No bleeding       Relevant Orders   EKG 12-Lead      Current medicines are reviewed at length with the patient today. (+/- concerns) n/a The following changes have been made: Ok to increase Lasix dose frequency.  Patient Instructions  Medication   -- take LASIX (  FUROSEMIDE) 3 DAYS A WEEK -- MAY TRY Monday - Wednesday -Friday FOR 2 WEEKS THEN RETURN TO TWICE A WEEK    Your physician wants you to follow-up in 12 MONTHS WITH DR Adeana Grilliot. You will receive a reminder letter in the mail two months in advance. If you don't receive a letter, please call our office to schedule the follow-up appointment.   If you need a refill on your cardiac medications before your next appointment, please call your pharmacy.    Studies Ordered:   Orders Placed This Encounter  Procedures  . EKG 12-Lead      Megan Munoz, M.D., M.S. Interventional Cardiologist   Pager # [phone] Phone # [phone] [address] [place], [place] [address]

## 2016-09-06 ENCOUNTER — Encounter: Payer: Self-pay | Admitting: Cardiology

## 2016-09-06 NOTE — Assessment & Plan Note (Signed)
Well-controlled despite being off ACE inhibitor. No active heart failure symptoms.

## 2016-09-06 NOTE — Assessment & Plan Note (Signed)
Edema is probably more related to venous stasis. Recommended increasing frequency of Lasix dosing. Also recommended support stockings.

## 2016-09-06 NOTE — Assessment & Plan Note (Signed)
Baseline diastolic dysfunction but no actual heart failure symptoms. I think the edema is more related to venous stasis than diastolic dysfunction. She is on good dose of carvedilol. We have stopped her ARB in the past. She is on when necessary Lasix only which will increase to 3 days a week for the next few weeks. I then instructed them to have her take her Lasix at least twice a week, but if swelling is getting worse, she should take at least another day or so in the course of a week.

## 2016-09-06 NOTE — Assessment & Plan Note (Signed)
Permanent persistent atrial fibrillation. Totally asymptomatic. No suggestion of rapid rate. She is on moderate dose beta blocker with no bradycardia symptoms. Anticoagulated with Xarelto. No bleeding

## 2016-09-22 ENCOUNTER — Telehealth: Payer: Self-pay | Admitting: Cardiology

## 2016-09-22 NOTE — Telephone Encounter (Signed)
We will need her most recent BMET from her PCP to verify the dose, once we get that we can refill and put Dr. Allison Quarry name on it

## 2016-09-22 NOTE — Telephone Encounter (Signed)
Patient's daughter called in to ask if this practice could take over for the refills of Xarelto which is currently followed by Dr. Ival Bible office. She thinks it would be beneficial to have the cardiologist office follow since she is taking it for atrial fibrillation. Will route to pharmd.

## 2016-09-22 NOTE — Telephone Encounter (Signed)
°  New Prob   Daughter has some questions regarding pts medications and what is being sent over to [facility]. Please call.

## 2016-09-22 NOTE — Telephone Encounter (Signed)
Patient's daughter stated that her mother has an appointment on July 9th and may get labs done then. She will have them sent to [place] then.

## 2016-10-05 DIAGNOSIS — Z23 Encounter for immunization: Secondary | ICD-10-CM | POA: Diagnosis not present

## 2016-10-11 ENCOUNTER — Other Ambulatory Visit: Payer: Self-pay | Admitting: Cardiology

## 2016-12-09 DIAGNOSIS — I10 Essential (primary) hypertension: Secondary | ICD-10-CM | POA: Diagnosis not present

## 2016-12-09 DIAGNOSIS — E782 Mixed hyperlipidemia: Secondary | ICD-10-CM | POA: Diagnosis not present

## 2016-12-09 DIAGNOSIS — I4891 Unspecified atrial fibrillation: Secondary | ICD-10-CM | POA: Diagnosis not present

## 2016-12-09 DIAGNOSIS — Z7901 Long term (current) use of anticoagulants: Secondary | ICD-10-CM | POA: Diagnosis not present

## 2016-12-16 DIAGNOSIS — Z6820 Body mass index (BMI) 20.0-20.9, adult: Secondary | ICD-10-CM | POA: Diagnosis not present

## 2016-12-16 DIAGNOSIS — I10 Essential (primary) hypertension: Secondary | ICD-10-CM | POA: Diagnosis not present

## 2016-12-16 DIAGNOSIS — I4891 Unspecified atrial fibrillation: Secondary | ICD-10-CM | POA: Diagnosis not present

## 2016-12-16 DIAGNOSIS — E782 Mixed hyperlipidemia: Secondary | ICD-10-CM | POA: Diagnosis not present

## 2016-12-16 DIAGNOSIS — Z23 Encounter for immunization: Secondary | ICD-10-CM | POA: Diagnosis not present

## 2016-12-18 DIAGNOSIS — H43813 Vitreous degeneration, bilateral: Secondary | ICD-10-CM | POA: Diagnosis not present

## 2017-01-09 ENCOUNTER — Other Ambulatory Visit: Payer: Self-pay | Admitting: Cardiology

## 2017-03-01 ENCOUNTER — Ambulatory Visit
Admission: RE | Admit: 2017-03-01 | Discharge: 2017-03-01 | Disposition: A | Discharge status: Home / Self Care | Payer: Medicare Other | Source: Ambulatory Visit | Attending: Family Medicine | Admitting: Family Medicine

## 2017-03-01 ENCOUNTER — Other Ambulatory Visit: Payer: Self-pay | Admitting: Family Medicine

## 2017-03-01 DIAGNOSIS — R05 Cough: Secondary | ICD-10-CM

## 2017-03-01 DIAGNOSIS — J189 Pneumonia, unspecified organism: Secondary | ICD-10-CM | POA: Diagnosis not present

## 2017-03-01 DIAGNOSIS — R0600 Dyspnea, unspecified: Secondary | ICD-10-CM

## 2017-03-01 DIAGNOSIS — R059 Cough, unspecified: Secondary | ICD-10-CM

## 2017-03-01 DIAGNOSIS — J069 Acute upper respiratory infection, unspecified: Secondary | ICD-10-CM | POA: Diagnosis not present

## 2017-03-01 DIAGNOSIS — K625 Hemorrhage of anus and rectum: Secondary | ICD-10-CM | POA: Diagnosis not present

## 2017-03-05 DIAGNOSIS — J189 Pneumonia, unspecified organism: Secondary | ICD-10-CM | POA: Diagnosis not present

## 2017-03-05 DIAGNOSIS — R0602 Shortness of breath: Secondary | ICD-10-CM | POA: Diagnosis not present

## 2017-04-01 ENCOUNTER — Other Ambulatory Visit: Payer: Self-pay | Admitting: Family Medicine

## 2017-04-01 ENCOUNTER — Ambulatory Visit
Admission: RE | Admit: 2017-04-01 | Discharge: 2017-04-01 | Disposition: A | Discharge status: Home / Self Care | Payer: Medicare Other | Source: Ambulatory Visit | Attending: Family Medicine | Admitting: Family Medicine

## 2017-04-01 DIAGNOSIS — Z09 Encounter for follow-up examination after completed treatment for conditions other than malignant neoplasm: Secondary | ICD-10-CM

## 2017-04-01 DIAGNOSIS — J189 Pneumonia, unspecified organism: Secondary | ICD-10-CM | POA: Diagnosis not present

## 2017-04-05 DIAGNOSIS — Z0000 Encounter for general adult medical examination without abnormal findings: Secondary | ICD-10-CM | POA: Diagnosis not present

## 2017-04-05 DIAGNOSIS — I509 Heart failure, unspecified: Secondary | ICD-10-CM | POA: Diagnosis not present

## 2017-04-05 DIAGNOSIS — Z1211 Encounter for screening for malignant neoplasm of colon: Secondary | ICD-10-CM | POA: Diagnosis not present

## 2017-04-05 DIAGNOSIS — Z6820 Body mass index (BMI) 20.0-20.9, adult: Secondary | ICD-10-CM | POA: Diagnosis not present

## 2017-04-05 DIAGNOSIS — T7840XA Allergy, unspecified, initial encounter: Secondary | ICD-10-CM | POA: Diagnosis not present

## 2017-04-05 DIAGNOSIS — F0390 Unspecified dementia without behavioral disturbance: Secondary | ICD-10-CM | POA: Diagnosis not present

## 2017-04-05 DIAGNOSIS — F331 Major depressive disorder, recurrent, moderate: Secondary | ICD-10-CM | POA: Diagnosis not present

## 2017-04-05 DIAGNOSIS — J189 Pneumonia, unspecified organism: Secondary | ICD-10-CM | POA: Diagnosis not present

## 2017-04-20 DIAGNOSIS — Z6820 Body mass index (BMI) 20.0-20.9, adult: Secondary | ICD-10-CM | POA: Diagnosis not present

## 2017-04-20 DIAGNOSIS — J069 Acute upper respiratory infection, unspecified: Secondary | ICD-10-CM | POA: Diagnosis not present

## 2017-04-20 DIAGNOSIS — J9801 Acute bronchospasm: Secondary | ICD-10-CM | POA: Diagnosis not present

## 2017-04-23 ENCOUNTER — Ambulatory Visit
Admission: RE | Admit: 2017-04-23 | Discharge: 2017-04-23 | Disposition: A | Discharge status: Home / Self Care | Payer: Medicare Other | Source: Ambulatory Visit | Attending: Family Medicine | Admitting: Family Medicine

## 2017-04-23 ENCOUNTER — Other Ambulatory Visit: Payer: Self-pay | Admitting: Family Medicine

## 2017-04-23 DIAGNOSIS — R05 Cough: Secondary | ICD-10-CM

## 2017-04-23 DIAGNOSIS — R509 Fever, unspecified: Principal | ICD-10-CM

## 2017-04-27 DIAGNOSIS — J9801 Acute bronchospasm: Secondary | ICD-10-CM | POA: Diagnosis not present

## 2017-04-27 DIAGNOSIS — J189 Pneumonia, unspecified organism: Secondary | ICD-10-CM | POA: Diagnosis not present

## 2017-04-27 DIAGNOSIS — Z6820 Body mass index (BMI) 20.0-20.9, adult: Secondary | ICD-10-CM | POA: Diagnosis not present

## 2017-04-27 DIAGNOSIS — R0602 Shortness of breath: Secondary | ICD-10-CM | POA: Diagnosis not present

## 2017-04-29 DIAGNOSIS — J9801 Acute bronchospasm: Secondary | ICD-10-CM | POA: Diagnosis not present

## 2017-04-29 DIAGNOSIS — J189 Pneumonia, unspecified organism: Secondary | ICD-10-CM | POA: Diagnosis not present

## 2017-04-29 DIAGNOSIS — J209 Acute bronchitis, unspecified: Secondary | ICD-10-CM | POA: Diagnosis not present

## 2017-04-29 DIAGNOSIS — R05 Cough: Secondary | ICD-10-CM | POA: Diagnosis not present

## 2017-04-29 DIAGNOSIS — R0603 Acute respiratory distress: Secondary | ICD-10-CM | POA: Diagnosis not present

## 2017-04-30 DIAGNOSIS — R0902 Hypoxemia: Secondary | ICD-10-CM | POA: Diagnosis not present

## 2017-04-30 DIAGNOSIS — J189 Pneumonia, unspecified organism: Secondary | ICD-10-CM | POA: Diagnosis not present

## 2017-04-30 DIAGNOSIS — J9801 Acute bronchospasm: Secondary | ICD-10-CM | POA: Diagnosis not present

## 2017-04-30 DIAGNOSIS — R0603 Acute respiratory distress: Secondary | ICD-10-CM | POA: Diagnosis not present

## 2017-05-04 DIAGNOSIS — E440 Moderate protein-calorie malnutrition: Secondary | ICD-10-CM | POA: Diagnosis not present

## 2017-05-04 DIAGNOSIS — Z6820 Body mass index (BMI) 20.0-20.9, adult: Secondary | ICD-10-CM | POA: Diagnosis not present

## 2017-05-04 DIAGNOSIS — R0602 Shortness of breath: Secondary | ICD-10-CM | POA: Diagnosis not present

## 2017-05-04 DIAGNOSIS — J189 Pneumonia, unspecified organism: Secondary | ICD-10-CM | POA: Diagnosis not present

## 2017-06-19 ENCOUNTER — Other Ambulatory Visit: Payer: Self-pay | Admitting: Cardiology

## 2017-07-20 NOTE — Progress Notes (Signed)
[facility]  PATIENT: Megan Munoz DOB: 10-25-1932   REASON FOR VISIT: follow up for dementia without behavior issues, essential tremor insomnia gait difficulty and history of stroke. HISTORY FROM: Patient and daughter    HISTORY OF PRESENT ILLNESS: Megan Munoz is a 84 yo RH , female, is accompanied by her daughter Megan Munoz, referred by her primary care physician Dr Ernie Hew for continued neurological care for her history of stroke, essential tremor,  She had a history of hypertension, hyperlipidemia, diagnosis of atrial fibrillation since 2015, is taking Xarelto  She suffered left frontal, basal ganglion stroke in 2010, presented with mild confusion, slow processing time, initially was treated with aspirin, since her diagnosis of atrial fibrillation in 2015, she was started on anticoagulation Xarelto  She moved in with her daughter recently, no longer driving, but independent in her daily activity, doing laundry, ambulate without assistance, mild memory trouble today's Mini-Mental Status Examination is 26 out of 30  She had long-standing history of gradual onset bilateral hands tremor, was diagnosed with essential tremor, acute worsening since her stroke in 2010, they have tried variable dose of primidone up to 750 mg daily, currently taking 250 mg twice a day, which works out best for her, she still has posturing tremor of her bilateral hands  UPDATE Dec 11 2014: She is with her daughter Megan Munoz at today's clinical visit, continue has mild memory trouble, unsteady gait,  I have reviewed MRI report from outside hospital February 2016 1.  No MR evidence of an acute intracranial abnormality. 2.  Sequela of previous left frontal infarct (with resultant cystic encephalomalacia with surrounding gliosis). 3.  Age-appropriate global cerebral volume loss and background microvascular disease. 4.  No MR evidence of abnormal intracranial enhancement. 5.  Bilateral maxillary  sinus disease with small fluid levels suggesting an acute sinusitis.  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 She is no longer cooking, increase mild unsteady gait, UPDATE 4/24/2019CM  Megan Munoz, 84 year old female returns for follow-up with history of dementia likely central nervous system degenerative disorder.  She also has a history of essential tremor gait abnormality and insomnia.  She also has a history of stress with minimal bruising and no bleeding.  Since last seen she received some physical therapy for her gait however she is not doing a home exercise program.  She denies any falls.  Her primidone was stopped after her last visit and her tremor is very intermittent.  It is not bothersome to her usually only bothers her during eating.  She takes melatonin for her insomnia with good results.  She continues to be very active does her own laundry, play solitaire and other  stimulating activities.  She returns for reevaluation   REVIEW OF SYSTEMS: Full 14 system review of systems performed and notable only for those listed, all others are neg:  Constitutional: neg  Cardiovascular: neg Ear/Nose/Throat: neg  Skin: neg Eyes: neg Respiratory: neg Gastroitestinal: neg  Hematology/Lymphatic: Easy bruising Endocrine: Intolerance to  cold Musculoskeletal:neg Allergy/Immunology: Environmental allergies. Neurological: Memory loss Psychiatric: neg Sleep : neg   ALLERGIES: Allergies  Allergen Reactions  . Tetanus Toxoids Other (See Comments)    Told never to take     HOME MEDICATIONS: Outpatient Medications Prior to Visit  Medication Sig Dispense Refill  . carvedilol (COREG) 12.5 MG tablet TAKE 1 TABLET TWICE A DAY WITH MEALS (DUE FOR FOLLOW UP APPOINTMENT IN JUNE) 180 tablet 2  . Cholecalciferol (VITAMIN D3 SUPER STRENGTH) 2000 units CAPS Take by mouth.    . diltiazem (CARDIZEM CD) 120 MG 24 hr capsule TAKE 3 CAPSULES DAILY 360 capsule 1  . diphenhydrAMINE (SOMINEX) 25 MG tablet Take 50 mg by mouth at bedtime.     . donepezil (ARICEPT) 10 MG tablet Take 1 tablet (10 mg total) by mouth at bedtime. 30 tablet 11  . feeding supplement, ENSURE ENLIVE, (ENSURE ENLIVE) LIQD Take 237 mLs by mouth 3 (three) times daily between meals. 237 mL 12  . ferrous sulfate 325 (65 FE) MG tablet Take 325 mg by mouth daily with breakfast.    . FLUoxetine (PROZAC) 20 MG capsule Take 20 mg by mouth daily.    . furosemide (LASIX) 20 MG tablet Take 1 tablet (20 mg total) by mouth daily as needed for fluid or edema. 30 tablet 6  . loratadine (CLARITIN) 10 MG tablet Take 10 mg by mouth daily.    . Melatonin 10 MG TABS Take 10 mg by mouth at bedtime.     . memantine (NAMENDA) 10 MG tablet Take 1 tablet (10 mg total) by mouth 2 (two) times daily. 180 tablet 4  . Multiple Vitamin (MULTIVITAMIN) capsule Take 1 capsule by mouth daily.    . rivaroxaban (XARELTO) 20 MG TABS tablet Take 20 mg by mouth daily with supper.    . simvastatin (ZOCOR) 40 MG tablet Take 40 mg by mouth daily.    Marland Kitchen albuterol (ACCUNEB) 0.63 MG/3ML nebulizer solution Take 3 mLs by nebulization every 6 (six) hours as needed for wheezing or shortness of breath. Reported on 08/21/2015  0  . B Complex Vitamins (VITAMIN B COMPLEX PO) Take 1 tablet by mouth daily.     . VENTOLIN HFA  108 (90 Base) MCG/ACT inhaler Inhale 2 puffs into the lungs daily as needed. Reported on 08/21/2015  0   No facility-administered medications prior to visit.     PAST MEDICAL HISTORY: Past Medical History:  Diagnosis Date  . Anxiety   . Atherosclerosis of aortic arch [facility]) April 2017   Noted on CT chest, calcified aortic atherosclerosis at the arch.  . Atrial fibrillation, permanent ([place]) 03/2013   Previously on Atenolol for Rate - now on combination diltiazem and carvedilol for rate Control;  & Xarelto for AC. CHADS2VASC2 = 7.  . Cerebral infarction [facility]) 10/2008   MRI Brain 05/2014 for near syncope revealed: No acure infarct.  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   Mild small vessel disease type changes.  No intracranial hemorrhage.  Global atrophy.   . CHF (congestive heart failure) ([place])   . Coronary atherosclerosis due to calcified coronary lesion April 2017   Noted on CT chest  . Hyperlipemia   . Hypertension   . Tinea unguium   . Tremor     PAST SURGICAL HISTORY: Past Surgical History:  Procedure Laterality Date  . BLADDER SURGERY    . CARPAL TUNNEL RELEASE Left   . FOOT SURGERY Right   . HERNIA REPAIR    . NASAL SINUS SURGERY    . TOTAL ABDOMINAL HYSTERECTOMY      FAMILY HISTORY: Family History  Problem Relation Age of Onset  . Osteoporosis Mother   . Emphysema Father   . Lung disease Father        Black Lung  . Stroke Brother   . Cancer Maternal Grandmother   . Emphysema Paternal Grandmother   . Asthma Daughter        allergy induced    SOCIAL HISTORY: Social History   Socioeconomic History  . Marital status: Widowed    Spouse name: Not on file  . Number of children: 1  . Years of education: 80  . Highest education level: Not on file  Occupational History  . Occupation: Retired  Scientific laboratory technician  . Financial resource strain: Not on file  . Food insecurity:     Worry: Not on file    Inability: Not on file  . Transportation needs:    Medical: Not on file    Non-medical: Not on file  Tobacco Use  . Smoking status: Former Smoker    Packs/day: 1.00    Years: 16.00    Pack years: 16.00    Start date: 03/30/1954    Last attempt to quit: 03/30/1970    Years since quitting: 47.3  . Smokeless tobacco: Never Used  . Tobacco comment: smoked 1/2-1 ppd  Substance and Sexual Activity  . Alcohol use: No    Alcohol/week: 0.0 oz  . Drug use: No  . Sexual activity: Not on file  Lifestyle  . Physical activity:    Days per week: Not on file    Minutes per session: Not on file  . Stress: Not on file  Relationships  . Social connections:    Talks on phone: Not on file    Gets together: Not on file    Attends religious service: Not on file    Active member of club or organization: Not on file    Attends meetings of clubs or organizations: Not on file    Relationship status: Not on file  . Intimate partner violence:    Fear of current or ex partner: Not on file    Emotionally abused: Not on file    Physically abused: Not on file    Forced sexual activity: Not on file  Other Topics Concern  . Not on file  Social History Narrative   Widow.  Lives with her daughter & her family.  (Son-in-law & 5 Grandsons).   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   2-4 cups caffeine/day   1-2 glassess of wine/beer  Per week.  Never smoked.      [facility]:   Originally from [place]. She lived in [place], [place], [place], & [place]. 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 No bird exposure. No mold or hot tub exposure.      PHYSICAL EXAM  Vitals:   07/21/17 0855  BP: 112/63  Pulse: (!) 56  Weight: 133 lb 6.4 oz (60.5 kg)  Height: 5\' 7"  (1.702 m)   Body mass index is 20.89 kg/m.  Generalized: Well developed, in no acute distress  Head: normocephalic and atraumatic,. Oropharynx benign  Neck: Supple, no  carotid bruits  Cardiac: Regular rate rhythm, no  murmur  Musculoskeletal: No deformity   Neurological examination   Mentation: Alert AFT 5 Clock drawing 4/4 MMSE - Mini Mental State Exam 07/21/2017 07/21/2016 12/11/2014  Orientation to time 5 4 4   Orientation to Place 3 5 4   Registration 3 3 3   Attention/ Calculation 5 3 5   Recall 1 0 0  Language- name 2 objects 2 2 2   Language- repeat 1 1 1   Language- follow 3 step command 3 3 3   Language- read & follow direction 1 1 1   Write a sentence 1 1 1   Copy design 1 1 1   Total score 26 24 25     Follows all commands speech and language fluent.   Cranial nerve II-XII: Pupils were equal round reactive to light extraocular movements were full, visual field were full on confrontational test. Facial sensation and strength were normal. hearing was intact to finger rubbing bilaterally. Uvula tongue midline. head turning and shoulder shrug were normal and symmetric.Tongue protrusion into cheek strength was normal. Motor: Mild posturing tremor on the right no significant weakness rigidity or bradykinesia  Sensory: Length dependent decreased light touch pinprick to ankle level decreased vibratory sensation to ankle   Coordination: finger-nose-finger, heel-to-shin bilaterally, no dysmetria Reflexes: Brachioradialis 2/2, biceps 2/2, triceps 2/2, patellar 2/2, Achilles 2/2, plantar responses were flexor bilaterally. Gait and Station: Rising up from seated position with push off, wide-based cautious gait unable to tandem. DIAGNOSTIC DATA (LABS, IMAGING, TESTING) - I reviewed patient records, labs, notes, testing and imaging myself where available.  Lab Results  Component Value Date   WBC 5.0 07/26/2015   HGB 9.5 (L) 07/26/2015   HCT 29.0 (L) 07/26/2015   MCV 97.0 07/26/2015   PLT 364 07/26/2015      Component Value Date/Time   NA 133 (L) 07/28/2015 0647   K 3.6 07/28/2015 0647   CL 91 (L) 07/28/2015 0647   CO2 34 (H) 07/28/2015 0647   GLUCOSE 108  (H) 07/28/2015 0647   BUN 16 07/28/2015 0647   CREATININE 0.55 07/28/2015 0647   CALCIUM 8.9 07/28/2015 0647   PROT 5.6 (L) 07/27/2015 0839   ALBUMIN 2.5 (L) 07/27/2015 0839   AST 29 07/27/2015 0839   ALT 20 07/27/2015 0839   ALKPHOS 67 07/27/2015 0839   BILITOT 0.6 07/27/2015 0839   GFRNONAA >60 07/28/2015 0647   GFRAA >60 07/28/2015 0647    Lab Results  Component Value Date   VITAMINB12 1,110 (H) 07/26/2015   Lab Results  Component Value Date   TSH 0.937 07/24/2015      ASSESSMENT AND PLAN  Megan Munoz is a 84 y.o. 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  MMSE 26 out of 30.  Mild essential tremor, insomnia using low-dose melatonin, and gait difficulty which is multif factorial .Left frontal encephalomalacia, history of atrial fibrillation, on Xarelto treatment, She is at high risk for developing partial seizure,The patient is a current patient of Dr. Krista Blue  who is out of the office today . This note is sent to the work in doctor.                PLAN: Continue Aricept and Namenda for memory we will refill, memory score is stable at 26/30. . Off primidone, try weighted utensils for feeding,  Home exercise program daily for gait abnormality Continue low-dose melatonin for insomnia Follow-up yearly and  as needed I spent 30 minutes in total face to face time with the patient more than 50% of which was spent counseling and coordination of care, reviewing test results reviewing medications and discussing and reviewing the diagnosis of dementia, gait disorder insomnia and essential tremor and answered questions Megan Munoz, [facility], [facility], APRN  [facility] [address] [place], [place] [address] [phone]

## 2017-07-21 ENCOUNTER — Encounter: Payer: Self-pay | Admitting: Nurse Practitioner

## 2017-07-21 ENCOUNTER — Ambulatory Visit (INDEPENDENT_AMBULATORY_CARE_PROVIDER_SITE_OTHER): Payer: Medicare Other | Admitting: Nurse Practitioner

## 2017-07-21 VITALS — BP 112/63 | HR 56 | Ht 67.0 in | Wt 133.4 lb

## 2017-07-21 DIAGNOSIS — R269 Unspecified abnormalities of gait and mobility: Secondary | ICD-10-CM

## 2017-07-21 DIAGNOSIS — F0390 Unspecified dementia without behavioral disturbance: Secondary | ICD-10-CM | POA: Diagnosis not present

## 2017-07-21 DIAGNOSIS — G4700 Insomnia, unspecified: Secondary | ICD-10-CM | POA: Diagnosis not present

## 2017-07-21 DIAGNOSIS — R251 Tremor, unspecified: Secondary | ICD-10-CM | POA: Insufficient documentation

## 2017-07-21 MED ORDER — DONEPEZIL HCL 10 MG PO TABS: 10.0000 mg | ORAL_TABLET | Freq: Every day | ORAL | 3 refills | Status: DC

## 2017-07-21 MED ORDER — MEMANTINE HCL 10 MG PO TABS: 10.0000 mg | ORAL_TABLET | Freq: Two times a day (BID) | ORAL | 3 refills | Status: DC

## 2017-07-21 NOTE — Patient Instructions (Signed)
Continue Aricept and Namenda for memory we will refill, memory score is stable. Off primidone, try weighted utensils for feeding Home exercise program daily Continue high-dose melatonin for insomnia Follow-up yearly and as needed

## 2017-07-23 NOTE — Progress Notes (Signed)
I agree with the above plan 

## 2017-08-03 DIAGNOSIS — E782 Mixed hyperlipidemia: Secondary | ICD-10-CM | POA: Diagnosis not present

## 2017-08-03 DIAGNOSIS — I509 Heart failure, unspecified: Secondary | ICD-10-CM | POA: Diagnosis not present

## 2017-08-03 DIAGNOSIS — Z79899 Other long term (current) drug therapy: Secondary | ICD-10-CM | POA: Diagnosis not present

## 2017-08-03 DIAGNOSIS — I4891 Unspecified atrial fibrillation: Secondary | ICD-10-CM | POA: Diagnosis not present

## 2017-08-06 DIAGNOSIS — I639 Cerebral infarction, unspecified: Secondary | ICD-10-CM | POA: Diagnosis not present

## 2017-08-06 DIAGNOSIS — I509 Heart failure, unspecified: Secondary | ICD-10-CM | POA: Diagnosis not present

## 2017-08-06 DIAGNOSIS — I4891 Unspecified atrial fibrillation: Secondary | ICD-10-CM | POA: Diagnosis not present

## 2017-08-06 DIAGNOSIS — I10 Essential (primary) hypertension: Secondary | ICD-10-CM | POA: Diagnosis not present

## 2017-08-06 DIAGNOSIS — K649 Unspecified hemorrhoids: Secondary | ICD-10-CM | POA: Diagnosis not present

## 2017-08-06 DIAGNOSIS — E785 Hyperlipidemia, unspecified: Secondary | ICD-10-CM | POA: Diagnosis not present

## 2017-08-06 DIAGNOSIS — K625 Hemorrhage of anus and rectum: Secondary | ICD-10-CM | POA: Diagnosis not present

## 2017-08-09 ENCOUNTER — Telehealth: Payer: Self-pay | Admitting: *Deleted

## 2017-08-09 NOTE — Telephone Encounter (Signed)
   [facility] Pre-operative Risk Assessment    Request for surgical clearance:  1. What type of surgery is being performed? COLONOSCOPY   2. When is this surgery scheduled?  08/30/2017   3. What type of clearance is required (medical clearance vs. Pharmacy clearance to hold med vs. Both)? MEDICATION   4. Are there any medications that need to be held prior to surgery and how long?XARELTO  3 DAYS   5. Practice name and name of physician performing surgery? [facility]   6. What is your office phone number [phone]    7.   What is your office fax number  [phone]   8.   Anesthesia type (None, local, MAC,  general) ?

## 2017-08-10 NOTE — Telephone Encounter (Signed)
Pt takes Xarelto for afib with CHADS2VASc of 8 (age x2, sex, HTN, CHF, CAD, stroke). Renal function is normal. Recommend only holding Xarelto for 24 hours prior to colonoscopy due to elevated cardiac risk including history of stroke.

## 2017-08-13 DIAGNOSIS — I4891 Unspecified atrial fibrillation: Secondary | ICD-10-CM | POA: Diagnosis not present

## 2017-08-13 DIAGNOSIS — Z7901 Long term (current) use of anticoagulants: Secondary | ICD-10-CM | POA: Diagnosis not present

## 2017-08-13 DIAGNOSIS — K625 Hemorrhage of anus and rectum: Secondary | ICD-10-CM | POA: Diagnosis not present

## 2017-08-13 DIAGNOSIS — Z681 Body mass index (BMI) 19 or less, adult: Secondary | ICD-10-CM | POA: Diagnosis not present

## 2017-08-19 DIAGNOSIS — Z681 Body mass index (BMI) 19 or less, adult: Secondary | ICD-10-CM | POA: Diagnosis not present

## 2017-08-19 DIAGNOSIS — K5900 Constipation, unspecified: Secondary | ICD-10-CM | POA: Diagnosis not present

## 2017-08-19 DIAGNOSIS — K625 Hemorrhage of anus and rectum: Secondary | ICD-10-CM | POA: Diagnosis not present

## 2017-08-19 DIAGNOSIS — K648 Other hemorrhoids: Secondary | ICD-10-CM | POA: Diagnosis not present

## 2017-09-27 ENCOUNTER — Encounter: Payer: Self-pay | Admitting: Cardiology

## 2017-09-27 ENCOUNTER — Ambulatory Visit (INDEPENDENT_AMBULATORY_CARE_PROVIDER_SITE_OTHER): Payer: Medicare Other | Admitting: Cardiology

## 2017-09-27 VITALS — BP 131/68 | HR 68 | Ht 67.0 in | Wt 129.2 lb

## 2017-09-27 DIAGNOSIS — I4821 Permanent atrial fibrillation: Secondary | ICD-10-CM

## 2017-09-27 DIAGNOSIS — I5032 Chronic diastolic (congestive) heart failure: Secondary | ICD-10-CM

## 2017-09-27 DIAGNOSIS — E782 Mixed hyperlipidemia: Secondary | ICD-10-CM | POA: Diagnosis not present

## 2017-09-27 DIAGNOSIS — I10 Essential (primary) hypertension: Secondary | ICD-10-CM

## 2017-09-27 DIAGNOSIS — R600 Localized edema: Secondary | ICD-10-CM

## 2017-09-27 DIAGNOSIS — I482 Chronic atrial fibrillation: Secondary | ICD-10-CM

## 2017-09-27 DIAGNOSIS — E440 Moderate protein-calorie malnutrition: Secondary | ICD-10-CM

## 2017-09-27 NOTE — Progress Notes (Signed)
PCP: Fanny Bien, MD  Clinic Note: Chief Complaint  Patient presents with  . Follow-up    No complaints.  Daughter concerned about poor appetite and weight loss  . Atrial Fibrillation    HPI: Megan Munoz is a 84 y.o. female with a PMH below who presents today for annual follow-up for atrial fibrillation.[name] was last seen on September 04, 2016.  Doing well.  Asymptomatic of A. fib.  No recurrent hospitalizations.  Mild lower extremity edema.  Recent Hospitalizations:   None  Studies Personally Reviewed - (if available, images/films reviewed: From Epic Chart or Care Everywhere)  None  Interval History: Megan Munoz returns today doing very well.  She is again accompanied by her daughter who helps her with her memory.  She actually is doing a pretty good job with being able to discuss her symptoms today.  She says that she does have some shortness of breath with exertion but that usually is when she is in a hurry try to get things done or is carrying an extra load of laundry around the house.  She tries to stay on the go and sometimes overdoes it without resting. She denies any chest tightness or pressure associated with it.  She has no sensation whatsoever of irregular heartbeats or palpitations.  She has no heart failure symptoms of PND, orthopnea or edema.  No syncope near syncope, TIA or amaurosis fugax symptoms. She herself has not noticed any melena, epistaxis or hematuria, but apparently there has been some blood in her stool (not sure if this is just guaiac positive versus noticing when wiping).  Currently she has an appointment to see GI medicine to consider colonoscopy.  Besides this, the biggest thing the daughter is concerned about is that her appetite is notably worsened and she is starting to lose weight.   No claudication.  ROS: A comprehensive was performed. Review of Systems  Constitutional: Positive for weight loss (Poor appetite). Negative for malaise/fatigue.    HENT: Negative for nosebleeds.   Respiratory: Negative for cough and shortness of breath.   Gastrointestinal: Negative for abdominal pain, constipation and melena.  Genitourinary: Negative for hematuria.  Musculoskeletal: Positive for joint pain (Mild joint pains but nothing limiting).  Neurological: Negative for dizziness and focal weakness.  Psychiatric/Behavioral: Positive for memory loss. Negative for depression. The patient is not nervous/anxious and does not have insomnia.   All other systems reviewed and are negative.   I have reviewed and (if needed) personally updated the patient's problem list, medications, allergies, past medical and surgical history, social and family history.   Past Medical History:  Diagnosis Date  . Anxiety   . Atherosclerosis of aortic arch [facility]) April 2017   Noted on CT chest, calcified aortic atherosclerosis at the arch.  . Atrial fibrillation, permanent ([place]) 03/2013   Previously on Atenolol for Rate - now on combination diltiazem and carvedilol for rate Control;  & Xarelto for AC. CHADS2VASC2 = 7.  . Cerebral infarction [facility]) 10/2008   MRI Brain 05/2014 for near syncope revealed: No acure infarct.  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   Mild small vessel disease type changes.  No intracranial hemorrhage.  Global atrophy.   . CHF (congestive heart failure) ([place])   . Coronary atherosclerosis due to calcified coronary lesion April 2017   Noted on CT chest  . Hyperlipemia   . Hypertension   .  Tinea unguium   . Tremor     Past Surgical History:  Procedure Laterality Date  . BLADDER SURGERY    . CARPAL TUNNEL RELEASE Left   . FOOT SURGERY Right   . HERNIA REPAIR    . NASAL SINUS SURGERY    . TOTAL ABDOMINAL HYSTERECTOMY      Current Meds  Medication Sig  . B Complex Vitamins (VITAMIN B COMPLEX PO) Take 1 tablet by mouth daily.   . carvedilol  (COREG) 12.5 MG tablet TAKE 1 TABLET TWICE A DAY WITH MEALS (DUE FOR FOLLOW UP APPOINTMENT IN [place])  . Cholecalciferol (VITAMIN D3 SUPER STRENGTH) 2000 units CAPS Take by mouth.  . diltiazem (CARDIZEM CD) 120 MG 24 hr capsule TAKE 3 CAPSULES DAILY  . diphenhydrAMINE (SOMINEX) 25 MG tablet Take 50 mg by mouth at bedtime.   . donepezil (ARICEPT) 10 MG tablet Take 1 tablet (10 mg total) by mouth at bedtime.  . feeding supplement, ENSURE ENLIVE, (ENSURE ENLIVE) LIQD Take 237 mLs by mouth 3 (three) times daily between meals.  . ferrous sulfate 325 (65 FE) MG tablet Take 325 mg by mouth daily with breakfast.  . FLUoxetine (PROZAC) 20 MG capsule Take 20 mg by mouth daily.  . furosemide (LASIX) 20 MG tablet Take 1 tablet (20 mg total) by mouth daily as needed for fluid or edema.  Marland Kitchen loratadine (CLARITIN) 10 MG tablet Take 10 mg by mouth daily.  . Melatonin 10 MG TABS Take 10 mg by mouth at bedtime.   . memantine (NAMENDA) 10 MG tablet Take 1 tablet (10 mg total) by mouth 2 (two) times daily.  . Multiple Vitamin (MULTIVITAMIN) capsule Take 1 capsule by mouth daily.  . rivaroxaban (XARELTO) 20 MG TABS tablet Take 20 mg by mouth daily with supper.  . simvastatin (ZOCOR) 40 MG tablet Take 40 mg by mouth daily.    Allergies  Allergen Reactions  . Tetanus Toxoids Other (See Comments)    Told never to take     Social History   Tobacco Use  . Smoking status: Former Smoker    Packs/day: 1.00    Years: 16.00    Pack years: 16.00    Start date: 03/30/1954    Last attempt to quit: 03/30/1970    Years since quitting: 47.5  . Smokeless tobacco: Never Used  . Tobacco comment: smoked 1/2-1 ppd  Substance Use Topics  . Alcohol use: No    Alcohol/week: 0.0 oz  . Drug use: No   Social History   Social History Narrative   Widow.  Lives with her daughter & her family.  (Son-in-law & 5 Grandsons).   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   2-4 cups caffeine/day   1-2  glassess of wine/beer  Per week.  Never smoked.      [facility]:   Originally from [place]. She lived in [place], [place], [place], & [place]. 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 No bird exposure. No mold or hot tub exposure.     family history includes Asthma in her daughter; Cancer in her maternal grandmother; Emphysema in her father and paternal grandmother; Lung disease in her father; Osteoporosis in her mother; Stroke in her brother.  Wt Readings from Last 3 Encounters:  09/27/17 129 lb 3.2 oz (58.6 kg)  07/21/17 133 lb 6.4 oz (60.5 kg)  09/04/16 132 lb (59.9 kg)    PHYSICAL EXAM BP 131/68   Pulse 68  Ht 5\' 7"  (1.702 m)   Wt 129 lb 3.2 oz (58.6 kg)   BMI 20.24 kg/m  Physical Exam  Constitutional: She is oriented to person, place, and time. She appears well-developed. No distress.  Somewhat thin, frail woman.  Well-groomed.  Answers questions better today than last time  HENT:  Head: Normocephalic and atraumatic.  Mild temporal wasting  Eyes: EOM are normal.  Neck: Normal range of motion. Neck supple. No hepatojugular reflux and no JVD present. Carotid bruit is not present. No thyromegaly present.  Cardiovascular: Normal rate, normal heart sounds and intact distal pulses. An irregularly irregular rhythm present. PMI is not displaced. Exam reveals no gallop and no friction rub.  No murmur heard. Pulmonary/Chest: Effort normal and breath sounds normal. No respiratory distress. She has no wheezes. She has no rales.  Abdominal: Soft. Bowel sounds are normal. She exhibits no distension. There is no tenderness. There is no rebound.  Increased bowel sounds.  No HSM.  Musculoskeletal: Normal range of motion. She exhibits no edema (Trivial).  Lymphadenopathy:    She has no cervical adenopathy.  Neurological: She is alert and oriented to person, place, and time.  Psychiatric: She has a normal mood and affect. Her behavior is normal. Thought content normal.    Relatively poor historian.  Poor memory.  She is able to provide some answers however.  Vitals reviewed.    Adult ECG Report  Rate: 83 ;  Rhythm: atrial fibrillation and Left Axis Deviation (-42). AS MI age undetermined; normal intervals & durations.  Narrative Interpretation: stable EKG   Other studies Reviewed: Additional studies/ records that were reviewed today include:  Recent Labs: None available   ASSESSMENT / PLAN: Problem List Items Addressed This Visit    Permanent atrial fibrillation [facility]): CHA2DS2-VASc Score 7 - on Xarelto - Primary (Chronic)    Permanent, persistent A. fib.  Totally asymptomatic.  Rate controlled. Continue current dose of beta-blocker and calcium channel blocker (carvedilol and diltiazem)..  No signs of bradycardia.  No bleeding issues with Xarelto.      Relevant Orders   EKG 12-Lead (Completed)   Malnutrition of moderate degree    Poor appetite with weight loss.  This is a gradual process, but somewhat concerning.  I think she is fine eating whatever she wants to eat.  I recommended that they supplement her diet with protein shakes or Carnation Instant Breakfast or even milkshakes.  I want her to eat whatever she wants to eat.      Hyperlipidemia (Chronic)    She remains on simvastatin.  I would consider stopping simvastatin because of her memory issues and considering another option.  Thinking quality of life versus quantity of life.      Essential hypertension (Chronic)    Blood pressure looks great.  I am happy with this range.  Doing fine off ACE inhibitor.      Chronic diastolic CHF (congestive heart failure) (H[facility]) (Chronic)    Euvolemic on exam.  She is taking Lasix 4 days a week and has controlled her edema quite well.  She is reluctant to take it any further than that because she may get little dehydrated.      Relevant Orders   EKG 12-Lead (Completed)   Bilateral lower extremity edema (Chronic)    Well-controlled with current dose  of Lasix.  She is also awaiting her feet and wearing compression stockings.         Current medicines are reviewed at length with the  patient today.  (+/- concerns) no medication changes The following changes have been made:  Eat.  Supplement diet with protein shakes, Carnation Instant Breakfast, ice cream/milkshakes.  Patient Instructions  NO CHANGES WITH MEDICATIONS   Your physician wants you to follow-up in July 2020 [place]. You will receive a reminder letter in the mail two months in advance. If you don't receive a letter, please call our office to schedule the follow-up appointment.   If you need a refill on your cardiac medications before your next appointment, please call your pharmacy.    Studies Ordered:   Orders Placed This Encounter  Procedures  . EKG 12-Lead      Glenetta Hew, M.D., M.S. Interventional Cardiologist   Pager # [phone] Phone # [phone] [address]. [place], [place] [address]   Thank you for choosing [facility] at [facility]!!

## 2017-09-27 NOTE — Patient Instructions (Addendum)
NO CHANGES WITH MEDICATIONS   Your physician wants you to follow-up in July 2020 [place]. You will receive a reminder letter in the mail two months in advance. If you don't receive a letter, please call our office to schedule the follow-up appointment.   If you need a refill on your cardiac medications before your next appointment, please call your pharmacy.

## 2017-09-28 ENCOUNTER — Encounter: Payer: Self-pay | Admitting: Cardiology

## 2017-09-28 NOTE — Assessment & Plan Note (Signed)
Well-controlled with current dose of Lasix.  She is also awaiting her feet and wearing compression stockings.

## 2017-09-28 NOTE — Assessment & Plan Note (Signed)
Permanent, persistent A. fib.  Totally asymptomatic.  Rate controlled. Continue current dose of beta-blocker and calcium channel blocker (carvedilol and diltiazem)..  No signs of bradycardia.  No bleeding issues with Xarelto.

## 2017-09-28 NOTE — Assessment & Plan Note (Signed)
Euvolemic on exam.  She is taking Lasix 4 days a week and has controlled her edema quite well.  She is reluctant to take it any further than that because she may get little dehydrated.

## 2017-09-28 NOTE — Assessment & Plan Note (Signed)
Blood pressure looks great.  I am happy with this range.  Doing fine off ACE inhibitor.

## 2017-09-28 NOTE — Assessment & Plan Note (Signed)
Poor appetite with weight loss.  This is a gradual process, but somewhat concerning.  I think she is fine eating whatever she wants to eat.  I recommended that they supplement her diet with protein shakes or Carnation Instant Breakfast or even milkshakes.  I want her to eat whatever she wants to eat.

## 2017-09-28 NOTE — Assessment & Plan Note (Signed)
She remains on simvastatin.  I would consider stopping simvastatin because of her memory issues and considering another option.  Thinking quality of life versus quantity of life.

## 2017-10-18 DIAGNOSIS — E559 Vitamin D deficiency, unspecified: Secondary | ICD-10-CM | POA: Diagnosis not present

## 2017-10-18 DIAGNOSIS — E785 Hyperlipidemia, unspecified: Secondary | ICD-10-CM | POA: Diagnosis not present

## 2017-10-18 DIAGNOSIS — E611 Iron deficiency: Secondary | ICD-10-CM | POA: Diagnosis not present

## 2017-10-18 DIAGNOSIS — E782 Mixed hyperlipidemia: Secondary | ICD-10-CM | POA: Diagnosis not present

## 2017-10-18 DIAGNOSIS — I10 Essential (primary) hypertension: Secondary | ICD-10-CM | POA: Diagnosis not present

## 2017-10-18 DIAGNOSIS — Z79899 Other long term (current) drug therapy: Secondary | ICD-10-CM | POA: Diagnosis not present

## 2017-10-18 DIAGNOSIS — I509 Heart failure, unspecified: Secondary | ICD-10-CM | POA: Diagnosis not present

## 2017-10-20 DIAGNOSIS — Z6820 Body mass index (BMI) 20.0-20.9, adult: Secondary | ICD-10-CM | POA: Diagnosis not present

## 2017-10-20 DIAGNOSIS — E559 Vitamin D deficiency, unspecified: Secondary | ICD-10-CM | POA: Diagnosis not present

## 2017-10-20 DIAGNOSIS — K625 Hemorrhage of anus and rectum: Secondary | ICD-10-CM | POA: Diagnosis not present

## 2017-10-20 DIAGNOSIS — K648 Other hemorrhoids: Secondary | ICD-10-CM | POA: Diagnosis not present

## 2017-10-20 DIAGNOSIS — K5900 Constipation, unspecified: Secondary | ICD-10-CM | POA: Diagnosis not present

## 2017-11-09 DIAGNOSIS — K649 Unspecified hemorrhoids: Secondary | ICD-10-CM | POA: Diagnosis not present

## 2017-11-09 DIAGNOSIS — Z681 Body mass index (BMI) 19 or less, adult: Secondary | ICD-10-CM | POA: Diagnosis not present

## 2017-11-09 DIAGNOSIS — G4700 Insomnia, unspecified: Secondary | ICD-10-CM | POA: Diagnosis not present

## 2017-11-09 DIAGNOSIS — F0390 Unspecified dementia without behavioral disturbance: Secondary | ICD-10-CM | POA: Diagnosis not present

## 2017-11-28 ENCOUNTER — Other Ambulatory Visit: Payer: Self-pay | Admitting: Cardiology

## 2017-12-01 ENCOUNTER — Other Ambulatory Visit: Payer: Self-pay

## 2017-12-01 MED ORDER — CARVEDILOL 12.5 MG PO TABS: ORAL_TABLET | ORAL | 2 refills | Status: DC

## 2017-12-18 ENCOUNTER — Other Ambulatory Visit: Payer: Self-pay | Admitting: Cardiology

## 2017-12-20 DIAGNOSIS — Z7901 Long term (current) use of anticoagulants: Secondary | ICD-10-CM | POA: Diagnosis not present

## 2017-12-22 DIAGNOSIS — G4700 Insomnia, unspecified: Secondary | ICD-10-CM | POA: Diagnosis not present

## 2017-12-22 DIAGNOSIS — I10 Essential (primary) hypertension: Secondary | ICD-10-CM | POA: Diagnosis not present

## 2017-12-22 DIAGNOSIS — Z23 Encounter for immunization: Secondary | ICD-10-CM | POA: Diagnosis not present

## 2017-12-22 DIAGNOSIS — Z6820 Body mass index (BMI) 20.0-20.9, adult: Secondary | ICD-10-CM | POA: Diagnosis not present

## 2017-12-22 DIAGNOSIS — K649 Unspecified hemorrhoids: Secondary | ICD-10-CM | POA: Diagnosis not present

## 2017-12-22 DIAGNOSIS — R5383 Other fatigue: Secondary | ICD-10-CM | POA: Diagnosis not present

## 2017-12-22 DIAGNOSIS — F0390 Unspecified dementia without behavioral disturbance: Secondary | ICD-10-CM | POA: Diagnosis not present

## 2017-12-22 DIAGNOSIS — I639 Cerebral infarction, unspecified: Secondary | ICD-10-CM | POA: Diagnosis not present

## 2017-12-22 DIAGNOSIS — K5900 Constipation, unspecified: Secondary | ICD-10-CM | POA: Diagnosis not present

## 2018-01-19 DIAGNOSIS — I4891 Unspecified atrial fibrillation: Secondary | ICD-10-CM | POA: Diagnosis not present

## 2018-01-19 DIAGNOSIS — R6889 Other general symptoms and signs: Secondary | ICD-10-CM | POA: Diagnosis not present

## 2018-01-19 DIAGNOSIS — R5383 Other fatigue: Secondary | ICD-10-CM | POA: Diagnosis not present

## 2018-01-19 DIAGNOSIS — E559 Vitamin D deficiency, unspecified: Secondary | ICD-10-CM | POA: Diagnosis not present

## 2018-01-19 DIAGNOSIS — I10 Essential (primary) hypertension: Secondary | ICD-10-CM | POA: Diagnosis not present

## 2018-01-19 DIAGNOSIS — I639 Cerebral infarction, unspecified: Secondary | ICD-10-CM | POA: Diagnosis not present

## 2018-01-19 DIAGNOSIS — D72818 Other decreased white blood cell count: Secondary | ICD-10-CM | POA: Diagnosis not present

## 2018-01-25 DIAGNOSIS — F0390 Unspecified dementia without behavioral disturbance: Secondary | ICD-10-CM | POA: Diagnosis not present

## 2018-01-25 DIAGNOSIS — I509 Heart failure, unspecified: Secondary | ICD-10-CM | POA: Diagnosis not present

## 2018-01-25 DIAGNOSIS — I4891 Unspecified atrial fibrillation: Secondary | ICD-10-CM | POA: Diagnosis not present

## 2018-01-25 DIAGNOSIS — Z7901 Long term (current) use of anticoagulants: Secondary | ICD-10-CM | POA: Diagnosis not present

## 2018-01-25 DIAGNOSIS — I110 Hypertensive heart disease with heart failure: Secondary | ICD-10-CM | POA: Diagnosis not present

## 2018-01-25 DIAGNOSIS — I69398 Other sequelae of cerebral infarction: Secondary | ICD-10-CM | POA: Diagnosis not present

## 2018-01-27 DIAGNOSIS — I4891 Unspecified atrial fibrillation: Secondary | ICD-10-CM | POA: Diagnosis not present

## 2018-01-27 DIAGNOSIS — F0390 Unspecified dementia without behavioral disturbance: Secondary | ICD-10-CM | POA: Diagnosis not present

## 2018-01-27 DIAGNOSIS — I509 Heart failure, unspecified: Secondary | ICD-10-CM | POA: Diagnosis not present

## 2018-01-27 DIAGNOSIS — I69398 Other sequelae of cerebral infarction: Secondary | ICD-10-CM | POA: Diagnosis not present

## 2018-01-27 DIAGNOSIS — Z7901 Long term (current) use of anticoagulants: Secondary | ICD-10-CM | POA: Diagnosis not present

## 2018-01-27 DIAGNOSIS — I110 Hypertensive heart disease with heart failure: Secondary | ICD-10-CM | POA: Diagnosis not present

## 2018-02-02 ENCOUNTER — Inpatient Hospital Stay (HOSPITAL_COMMUNITY)
Admission: EM | Admit: 2018-02-02 | Discharge: 2018-02-07 | DRG: 190 | Disposition: A | Discharge status: Home Health | Payer: Medicare Other | Attending: Family Medicine | Admitting: Family Medicine

## 2018-02-02 ENCOUNTER — Encounter (HOSPITAL_COMMUNITY): Payer: Self-pay | Admitting: Emergency Medicine

## 2018-02-02 ENCOUNTER — Other Ambulatory Visit: Payer: Self-pay

## 2018-02-02 ENCOUNTER — Emergency Department (HOSPITAL_COMMUNITY): Payer: Medicare Other

## 2018-02-02 DIAGNOSIS — I472 Ventricular tachycardia: Secondary | ICD-10-CM | POA: Diagnosis not present

## 2018-02-02 DIAGNOSIS — I361 Nonrheumatic tricuspid (valve) insufficiency: Secondary | ICD-10-CM | POA: Diagnosis not present

## 2018-02-02 DIAGNOSIS — J441 Chronic obstructive pulmonary disease with (acute) exacerbation: Secondary | ICD-10-CM | POA: Diagnosis present

## 2018-02-02 DIAGNOSIS — R0603 Acute respiratory distress: Secondary | ICD-10-CM

## 2018-02-02 DIAGNOSIS — E876 Hypokalemia: Secondary | ICD-10-CM | POA: Diagnosis not present

## 2018-02-02 DIAGNOSIS — Z825 Family history of asthma and other chronic lower respiratory diseases: Secondary | ICD-10-CM

## 2018-02-02 DIAGNOSIS — J439 Emphysema, unspecified: Secondary | ICD-10-CM | POA: Diagnosis present

## 2018-02-02 DIAGNOSIS — J432 Centrilobular emphysema: Secondary | ICD-10-CM | POA: Diagnosis not present

## 2018-02-02 DIAGNOSIS — F0390 Unspecified dementia without behavioral disturbance: Secondary | ICD-10-CM | POA: Diagnosis present

## 2018-02-02 DIAGNOSIS — D649 Anemia, unspecified: Secondary | ICD-10-CM | POA: Diagnosis not present

## 2018-02-02 DIAGNOSIS — Z8551 Personal history of malignant neoplasm of bladder: Secondary | ICD-10-CM

## 2018-02-02 DIAGNOSIS — E860 Dehydration: Secondary | ICD-10-CM | POA: Diagnosis present

## 2018-02-02 DIAGNOSIS — Z887 Allergy status to serum and vaccine status: Secondary | ICD-10-CM

## 2018-02-02 DIAGNOSIS — K920 Hematemesis: Secondary | ICD-10-CM | POA: Diagnosis not present

## 2018-02-02 DIAGNOSIS — G9389 Other specified disorders of brain: Secondary | ICD-10-CM

## 2018-02-02 DIAGNOSIS — K922 Gastrointestinal hemorrhage, unspecified: Secondary | ICD-10-CM | POA: Diagnosis not present

## 2018-02-02 DIAGNOSIS — Z823 Family history of stroke: Secondary | ICD-10-CM

## 2018-02-02 DIAGNOSIS — F329 Major depressive disorder, single episode, unspecified: Secondary | ICD-10-CM | POA: Diagnosis present

## 2018-02-02 DIAGNOSIS — J069 Acute upper respiratory infection, unspecified: Secondary | ICD-10-CM | POA: Diagnosis not present

## 2018-02-02 DIAGNOSIS — J440 Chronic obstructive pulmonary disease with acute lower respiratory infection: Principal | ICD-10-CM | POA: Diagnosis present

## 2018-02-02 DIAGNOSIS — D509 Iron deficiency anemia, unspecified: Secondary | ICD-10-CM | POA: Diagnosis present

## 2018-02-02 DIAGNOSIS — I340 Nonrheumatic mitral (valve) insufficiency: Secondary | ICD-10-CM | POA: Diagnosis present

## 2018-02-02 DIAGNOSIS — Z8673 Personal history of transient ischemic attack (TIA), and cerebral infarction without residual deficits: Secondary | ICD-10-CM

## 2018-02-02 DIAGNOSIS — I4821 Permanent atrial fibrillation: Secondary | ICD-10-CM | POA: Diagnosis present

## 2018-02-02 DIAGNOSIS — J9601 Acute respiratory failure with hypoxia: Secondary | ICD-10-CM | POA: Diagnosis present

## 2018-02-02 DIAGNOSIS — K3189 Other diseases of stomach and duodenum: Secondary | ICD-10-CM | POA: Diagnosis not present

## 2018-02-02 DIAGNOSIS — J189 Pneumonia, unspecified organism: Secondary | ICD-10-CM | POA: Diagnosis present

## 2018-02-02 DIAGNOSIS — X58XXXA Exposure to other specified factors, initial encounter: Secondary | ICD-10-CM | POA: Diagnosis present

## 2018-02-02 DIAGNOSIS — Z90710 Acquired absence of both cervix and uterus: Secondary | ICD-10-CM

## 2018-02-02 DIAGNOSIS — K7689 Other specified diseases of liver: Secondary | ICD-10-CM | POA: Diagnosis not present

## 2018-02-02 DIAGNOSIS — Z87891 Personal history of nicotine dependence: Secondary | ICD-10-CM

## 2018-02-02 DIAGNOSIS — R05 Cough: Secondary | ICD-10-CM | POA: Diagnosis not present

## 2018-02-02 DIAGNOSIS — K2970 Gastritis, unspecified, without bleeding: Secondary | ICD-10-CM | POA: Diagnosis present

## 2018-02-02 DIAGNOSIS — I509 Heart failure, unspecified: Secondary | ICD-10-CM | POA: Diagnosis not present

## 2018-02-02 DIAGNOSIS — I482 Chronic atrial fibrillation, unspecified: Secondary | ICD-10-CM | POA: Diagnosis not present

## 2018-02-02 DIAGNOSIS — T18198A Other foreign object in esophagus causing other injury, initial encounter: Secondary | ICD-10-CM | POA: Diagnosis present

## 2018-02-02 DIAGNOSIS — E785 Hyperlipidemia, unspecified: Secondary | ICD-10-CM | POA: Diagnosis present

## 2018-02-02 DIAGNOSIS — Z7901 Long term (current) use of anticoagulants: Secondary | ICD-10-CM

## 2018-02-02 DIAGNOSIS — I2584 Coronary atherosclerosis due to calcified coronary lesion: Secondary | ICD-10-CM | POA: Diagnosis present

## 2018-02-02 DIAGNOSIS — I110 Hypertensive heart disease with heart failure: Secondary | ICD-10-CM | POA: Diagnosis present

## 2018-02-02 DIAGNOSIS — I272 Pulmonary hypertension, unspecified: Secondary | ICD-10-CM | POA: Diagnosis not present

## 2018-02-02 DIAGNOSIS — Z8701 Personal history of pneumonia (recurrent): Secondary | ICD-10-CM

## 2018-02-02 DIAGNOSIS — D638 Anemia in other chronic diseases classified elsewhere: Secondary | ICD-10-CM | POA: Diagnosis present

## 2018-02-02 DIAGNOSIS — Z66 Do not resuscitate: Secondary | ICD-10-CM | POA: Diagnosis present

## 2018-02-02 DIAGNOSIS — K219 Gastro-esophageal reflux disease without esophagitis: Secondary | ICD-10-CM | POA: Diagnosis present

## 2018-02-02 DIAGNOSIS — I700 Atherosclerosis of aorta: Secondary | ICD-10-CM | POA: Diagnosis present

## 2018-02-02 DIAGNOSIS — Z6820 Body mass index (BMI) 20.0-20.9, adult: Secondary | ICD-10-CM | POA: Diagnosis not present

## 2018-02-02 DIAGNOSIS — I2510 Atherosclerotic heart disease of native coronary artery without angina pectoris: Secondary | ICD-10-CM | POA: Diagnosis present

## 2018-02-02 DIAGNOSIS — K921 Melena: Secondary | ICD-10-CM | POA: Diagnosis present

## 2018-02-02 DIAGNOSIS — Z79899 Other long term (current) drug therapy: Secondary | ICD-10-CM

## 2018-02-02 DIAGNOSIS — R251 Tremor, unspecified: Secondary | ICD-10-CM | POA: Diagnosis present

## 2018-02-02 DIAGNOSIS — R000 Tachycardia, unspecified: Secondary | ICD-10-CM | POA: Diagnosis not present

## 2018-02-02 DIAGNOSIS — R0602 Shortness of breath: Secondary | ICD-10-CM | POA: Diagnosis not present

## 2018-02-02 DIAGNOSIS — B351 Tinea unguium: Secondary | ICD-10-CM | POA: Diagnosis present

## 2018-02-02 DIAGNOSIS — I5041 Acute combined systolic (congestive) and diastolic (congestive) heart failure: Secondary | ICD-10-CM | POA: Diagnosis not present

## 2018-02-02 DIAGNOSIS — I5033 Acute on chronic diastolic (congestive) heart failure: Secondary | ICD-10-CM | POA: Clinically undetermined

## 2018-02-02 DIAGNOSIS — I4891 Unspecified atrial fibrillation: Secondary | ICD-10-CM | POA: Diagnosis not present

## 2018-02-02 DIAGNOSIS — I5043 Acute on chronic combined systolic (congestive) and diastolic (congestive) heart failure: Secondary | ICD-10-CM | POA: Diagnosis not present

## 2018-02-02 DIAGNOSIS — K2971 Gastritis, unspecified, with bleeding: Secondary | ICD-10-CM | POA: Diagnosis not present

## 2018-02-02 LAB — I-STAT CG4 LACTIC ACID, ED: Lactic Acid, Venous: 2.14 mmol/L (ref 0.5–1.9)

## 2018-02-02 LAB — I-STAT VENOUS BLOOD GAS, ED
Acid-base deficit: 1 mmol/L (ref 0.0–2.0)
Bicarbonate: 24.8 mmol/L (ref 20.0–28.0)
O2 Saturation: 52 %
TCO2: 26 mmol/L (ref 22–32)
pCO2, Ven: 44 mmHg (ref 44.0–60.0)
pH, Ven: 7.359 (ref 7.250–7.430)
pO2, Ven: 29 mmHg — CL (ref 32.0–45.0)

## 2018-02-02 LAB — CBC
HEMATOCRIT: 24.3 % — AB (ref 36.0–46.0)
HEMOGLOBIN: 7.2 g/dL — AB (ref 12.0–15.0)
MCH: 29.5 pg (ref 26.0–34.0)
MCHC: 29.6 g/dL — AB (ref 30.0–36.0)
MCV: 99.6 fL (ref 80.0–100.0)
Platelets: 241 10*3/uL (ref 150–400)
RBC: 2.44 MIL/uL — ABNORMAL LOW (ref 3.87–5.11)
RDW: 13.2 % (ref 11.5–15.5)
WBC: 8.9 10*3/uL (ref 4.0–10.5)
nRBC: 0 % (ref 0.0–0.2)

## 2018-02-02 LAB — BASIC METABOLIC PANEL
Anion gap: 9 (ref 5–15)
BUN: 15 mg/dL (ref 8–23)
CHLORIDE: 102 mmol/L (ref 98–111)
CO2: 25 mmol/L (ref 22–32)
Calcium: 8.8 mg/dL — ABNORMAL LOW (ref 8.9–10.3)
Creatinine, Ser: 0.68 mg/dL (ref 0.44–1.00)
GFR calc Af Amer: >60 mL/min (ref >60)
GFR calc non Af Amer: >60 mL/min (ref >60)
GLUCOSE: 206 mg/dL — AB (ref 70–99)
POTASSIUM: 3.7 mmol/L (ref 3.5–5.1)
Sodium: 136 mmol/L (ref 135–145)

## 2018-02-02 LAB — RETICULOCYTES
IMMATURE RETIC FRACT: 18 % — AB (ref 2.3–15.9)
RBC.: 2.22 MIL/uL — ABNORMAL LOW (ref 3.87–5.11)
RETIC COUNT ABSOLUTE: 86.2 10*3/uL (ref 19.0–186.0)
Retic Ct Pct: 3.8 % — ABNORMAL HIGH (ref 0.4–3.1)

## 2018-02-02 LAB — VITAMIN B12: Vitamin B-12: 1939 pg/mL — ABNORMAL HIGH (ref 180–914)

## 2018-02-02 LAB — I-STAT TROPONIN, ED: Troponin i, poc: 0 ng/mL (ref 0.00–0.08)

## 2018-02-02 LAB — PREPARE RBC (CROSSMATCH)

## 2018-02-02 LAB — IRON AND TIBC
IRON: 9 ug/dL — AB (ref 28–170)
Saturation Ratios: 2 % — ABNORMAL LOW (ref 10.4–31.8)
TIBC: 372 ug/dL (ref 250–450)
UIBC: 363 ug/dL

## 2018-02-02 LAB — FERRITIN: Ferritin: 35 ng/mL (ref 11–307)

## 2018-02-02 LAB — BRAIN NATRIURETIC PEPTIDE: B Natriuretic Peptide: 516.8 pg/mL — ABNORMAL HIGH (ref 0.0–100.0)

## 2018-02-02 LAB — POC OCCULT BLOOD, ED: Fecal Occult Bld: POSITIVE — AB

## 2018-02-02 LAB — ABO/RH: ABO/RH(D): A NEG

## 2018-02-02 LAB — FOLATE: Folate: 44.1 ng/mL (ref >5.9)

## 2018-02-02 LAB — SAVE SMEAR (SSMR)

## 2018-02-02 MED ORDER — ATORVASTATIN CALCIUM 20 MG PO TABS
20.0000 mg | ORAL_TABLET | Freq: Every day | ORAL | Status: DC
  Administered 2018-02-03 – 2018-02-06 (×4): 20 mg via ORAL
  Filled 2018-02-02 (×4): qty 1

## 2018-02-02 MED ORDER — DOCUSATE SODIUM 100 MG PO CAPS
200.0000 mg | ORAL_CAPSULE | Freq: Two times a day (BID) | ORAL | Status: DC
  Administered 2018-02-02 – 2018-02-06 (×9): 200 mg via ORAL
  Filled 2018-02-02 (×9): qty 2

## 2018-02-02 MED ORDER — CALCITONIN (SALMON) 200 UNIT/ACT NA SOLN
1.0000 | Freq: Every day | NASAL | Status: DC
  Administered 2018-02-03 – 2018-02-07 (×3): 1 via NASAL
  Filled 2018-02-02: qty 3.7

## 2018-02-02 MED ORDER — SODIUM CHLORIDE 0.9 % IV SOLN
10.0000 mL/h | Freq: Once | INTRAVENOUS | Status: AC
  Administered 2018-02-02: 10 mL/h via INTRAVENOUS

## 2018-02-02 MED ORDER — SIMVASTATIN 40 MG PO TABS: 40.0000 mg | ORAL_TABLET | Freq: Every day | ORAL | Status: DC

## 2018-02-02 MED ORDER — BOOST / RESOURCE BREEZE PO LIQD CUSTOM
1.0000 | Freq: Three times a day (TID) | ORAL | Status: DC
  Administered 2018-02-03 – 2018-02-05 (×6): 1 via ORAL

## 2018-02-02 MED ORDER — FLUOXETINE HCL 20 MG PO CAPS
20.0000 mg | ORAL_CAPSULE | Freq: Every day | ORAL | Status: DC
  Administered 2018-02-03 – 2018-02-07 (×5): 20 mg via ORAL
  Filled 2018-02-02 (×5): qty 1

## 2018-02-02 MED ORDER — PANTOPRAZOLE SODIUM 40 MG PO TBEC
40.0000 mg | DELAYED_RELEASE_TABLET | Freq: Two times a day (BID) | ORAL | Status: DC
  Administered 2018-02-02 – 2018-02-05 (×7): 40 mg via ORAL
  Filled 2018-02-02 (×7): qty 1

## 2018-02-02 MED ORDER — SODIUM CHLORIDE 0.9 % IV BOLUS
500.0000 mL | Freq: Once | INTRAVENOUS | Status: AC
  Administered 2018-02-02: 500 mL via INTRAVENOUS

## 2018-02-02 MED ORDER — SODIUM CHLORIDE 0.9 % IV SOLN
500.0000 mg | INTRAVENOUS | Status: DC
  Administered 2018-02-02 – 2018-02-03 (×2): 500 mg via INTRAVENOUS
  Filled 2018-02-02 (×3): qty 500

## 2018-02-02 MED ORDER — DILTIAZEM HCL ER COATED BEADS 240 MG PO CP24
240.0000 mg | ORAL_CAPSULE | Freq: Every day | ORAL | Status: DC
  Administered 2018-02-03 – 2018-02-07 (×5): 240 mg via ORAL
  Filled 2018-02-02 (×5): qty 1

## 2018-02-02 MED ORDER — CARVEDILOL 12.5 MG PO TABS
12.5000 mg | ORAL_TABLET | Freq: Two times a day (BID) | ORAL | Status: DC
  Administered 2018-02-03: 12.5 mg via ORAL
  Filled 2018-02-02: qty 1

## 2018-02-02 MED ORDER — SODIUM CHLORIDE 0.9 % IV SOLN
2.0000 g | INTRAVENOUS | Status: DC
  Administered 2018-02-02 – 2018-02-03 (×2): 2 g via INTRAVENOUS
  Filled 2018-02-02 (×3): qty 20

## 2018-02-02 NOTE — ED Provider Notes (Signed)
Emergency Department Provider Note   I have reviewed the triage vital signs and the nursing notes.   HISTORY  Chief Complaint Shortness of Breath   HPI Megan Munoz is a 84 y.o. female with PMH of A-fib on Xarelto, CVA, HTN, HLD presents to the emergency department for evaluation of progressively worsening generalized weakness and shortness of breath.  Patient denies any fevers or chills.  Family at bedside states that she has had maroon-colored stool chronically but no history of GI bleeding.  She has not seen any bright red blood or black in the bowel movements.  The active chest pain or pressure.  No heart palpitations.  She went to her PCP office today and was referred to the emergency department with worsening anemia and weakness.  She does not require oxygen at home.   Past Medical History:  Diagnosis Date  . Anxiety   . Atherosclerosis of aortic arch [facility]) April 2017   Noted on CT chest, calcified aortic atherosclerosis at the arch.  . Atrial fibrillation, permanent 03/2013   Previously on Atenolol for Rate - now on combination diltiazem and carvedilol for rate Control;  & Xarelto for AC. CHADS2VASC2 = 7.  . Cerebral infarction [facility]) 10/2008   MRI Brain 05/2014 for near syncope revealed: No acure infarct.  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   Mild small vessel disease type changes.  No intracranial hemorrhage.  Global atrophy.   . CHF (congestive heart failure) ([place])   . Coronary atherosclerosis due to calcified coronary lesion April 2017   Noted on CT chest  . Hyperlipemia   . Hypertension   . Tinea unguium   . Tremor     Patient Active Problem List   Diagnosis Date Noted  . CAP (community acquired pneumonia) 02/02/2018  . Tremor 07/21/2017  . Insomnia 07/21/2017  . Dementia without behavioral disturbance ([place]) 07/21/2016  . Gait abnormality 07/21/2016  . Recurrent  pneumonia 12/06/2015  . Multiple lung nodules on CT 11/01/2015  . Chronic diastolic CHF (congestive heart failure) ([place]) 10/30/2015  . Bilateral lower extremity edema 08/05/2015  . Lobar pneumonia due to unspecified organism 07/30/2015  . Malnutrition of moderate degree 07/29/2015  . Hyponatremia 07/25/2015  . Symptomatic anemia 07/25/2015  . Acute respiratory failure with hypoxia ([place]) 07/24/2015  . Essential hypertension 07/22/2014  . Permanent atrial fibrillation ([place]): CHA2DS2-VASc Score 7 - on Xarelto 07/07/2014  . Hyperlipidemia 07/07/2014  . H/O: stroke 10/29/2008    Past Surgical History:  Procedure Laterality Date  . BLADDER SURGERY    . CARPAL TUNNEL RELEASE Left   . FOOT SURGERY Right   . HERNIA REPAIR    . NASAL SINUS SURGERY    . TOTAL ABDOMINAL HYSTERECTOMY      Allergies Tetanus toxoids  Family History  Problem Relation Age of Onset  . Osteoporosis Mother   . Emphysema Father   . Lung disease Father        Black Lung  . Stroke Brother   . Cancer Maternal Grandmother   . Emphysema Paternal Grandmother   . Asthma Daughter        allergy induced    Social History Social History   Tobacco Use  . Smoking status: Former Smoker    Packs/day: 1.00    Years: 16.00    Pack years: 16.00    Start date: 03/30/1954    Last attempt to quit: 03/30/1970  Years since quitting: 47.8  . Smokeless tobacco: Never Used  . Tobacco comment: smoked 1/2-1 ppd  Substance Use Topics  . Alcohol use: No    Alcohol/week: 0.0 standard drinks  . Drug use: No    Review of Systems  Constitutional: No fever/chills. Positive generalized weakness.  Eyes: No visual changes. ENT: No sore throat. Cardiovascular: Denies chest pain. Respiratory: Positive shortness of breath. Gastrointestinal: No abdominal pain.  No nausea, no vomiting.  No diarrhea.  No constipation. Genitourinary: Negative for dysuria. Musculoskeletal: Negative for back pain. Skin: Negative for  rash. Neurological: Negative for headaches, focal weakness or numbness.  10-point ROS otherwise negative.  ____________________________________________   PHYSICAL EXAM:  VITAL SIGNS: ED Triage Vitals  Enc Vitals Group     BP 02/02/18 1300 (!) 127/106     Pulse --      Resp 02/02/18 1300 (!) 33     Temp 02/02/18 1301 98.6 F (37 C)     Temp Source 02/02/18 1301 Oral     SpO2 02/02/18 1245 (!) 84 %     Weight 02/02/18 1245 132 lb (59.9 kg)     Height 02/02/18 1245 5\' 7"  (1.702 m)     Pain Score 02/02/18 1245 0    Constitutional: Alert and oriented. Thin, pale, and with increased WOB with minimal movement.  Eyes: Conjunctivae are pale.  Head: Atraumatic. Nose: No congestion/rhinnorhea. Mouth/Throat: Mucous membranes are moist.  Neck: No stridor.   Cardiovascular: Normal rate, regular rhythm. Good peripheral circulation. Grossly normal heart sounds.   Respiratory: Normal respiratory effort.  No retractions. Lungs CTAB. Gastrointestinal: Soft and nontender. No distention. Rectal exam with brown stool. No gross blood or melena.  Musculoskeletal: No lower extremity tenderness nor edema. No gross deformities of extremities. Neurologic:  Normal speech and language. No gross focal neurologic deficits are appreciated.  Skin:  Skin is warm, dry and intact. No rash noted.  ____________________________________________   LABS (all labs ordered are listed, but only abnormal results are displayed)  Labs Reviewed  BASIC METABOLIC PANEL - Abnormal; Notable for the following components:      Result Value   Glucose, Bld 206 (*)    Calcium 8.8 (*)    All other components within normal limits  CBC - Abnormal; Notable for the following components:   RBC 2.44 (*)    Hemoglobin 7.2 (*)    HCT 24.3 (*)    MCHC 29.6 (*)    All other components within normal limits  BRAIN NATRIURETIC PEPTIDE - Abnormal; Notable for the following components:   B Natriuretic Peptide 516.8 (*)    All other  components within normal limits  VITAMIN B12 - Abnormal; Notable for the following components:   Vitamin B-12 1,939 (*)    All other components within normal limits  IRON AND TIBC - Abnormal; Notable for the following components:   Iron 9 (*)    Saturation Ratios 2 (*)    All other components within normal limits  RETICULOCYTES - Abnormal; Notable for the following components:   Retic Ct Pct 3.8 (*)    RBC. 2.22 (*)    Immature Retic Fract 18.0 (*)    All other components within normal limits  I-STAT VENOUS BLOOD GAS, ED - Abnormal; Notable for the following components:   pO2, Ven 29.0 (*)    All other components within normal limits  POC OCCULT BLOOD, ED - Abnormal; Notable for the following components:   Fecal Occult Bld POSITIVE (*)  All other components within normal limits  I-STAT CG4 LACTIC ACID, ED - Abnormal; Notable for the following components:   Lactic Acid, Venous 2.14 (*)    All other components within normal limits  CULTURE, BLOOD (ROUTINE X 2)  CULTURE, BLOOD (ROUTINE X 2)  FOLATE  FERRITIN  SAVE SMEAR (SSMR)  LACTIC ACID, PLASMA  LACTIC ACID, PLASMA  I-STAT TROPONIN, ED  I-STAT CG4 LACTIC ACID, ED  TYPE AND SCREEN  PREPARE RBC (CROSSMATCH)  ABO/RH   ____________________________________________  EKG   EKG Interpretation  Date/Time:  Wednesday February 02 2018 12:59:20 EST Ventricular Rate:  81 PR Interval:    QRS Duration: 81 QT Interval:  387 QTC Calculation: 452 R Axis:   33 Text Interpretation:  Atrial fibrillation Probable anteroseptal infarct, old Nonspecific repol abnormality, diffuse leads No STEMI.  Confirmed by Nanda Quinton [phone]) on 02/02/2018 1:38:50 PM       ____________________________________________  RADIOLOGY  Dg Chest 2 View  Result Date: 02/02/2018 CLINICAL DATA:  Cough and shortness of breath and intermittent fever for 5 days. Shortness of breath is worsening. History of CHF, previous CVA, former smoker. EXAM: CHEST - 2  VIEW COMPARISON:  Chest x-ray of March 23, 2018 FINDINGS: The lungs are mildly hyperinflated. There is confluent airspace opacity in the left upper lobe a. There is patchy increased density in the right perihilar and suprahilar regions. The heart is top-normal in size. The pulmonary vascularity is not engorged. There is no pleural effusion. The bony thorax exhibits no acute abnormality. IMPRESSION: Left upper lobe pneumonia. Probable developing right upper lobe pneumonia. Followup PA and lateral chest X-ray is recommended in 3-4 weeks following trial of antibiotic therapy to ensure resolution and exclude underlying malignancy. Top-normal cardiac size without evidence of CHF. Thoracic aortic atherosclerosis. Electronically Signed   By: David  Martinique M.D.   On: 02/02/2018 13:37    ____________________________________________   PROCEDURES  Procedure(s) performed:   Procedures  CRITICAL CARE Performed by: Margette Fast Total critical care time: 35 minutes Critical care time was exclusive of separately billable procedures and treating other patients. Critical care was necessary to treat or prevent imminent or life-threatening deterioration. Critical care was time spent personally by me on the following activities: development of treatment plan with patient and/or surrogate as well as nursing, discussions with consultants, evaluation of patient's response to treatment, examination of patient, obtaining history from patient or surrogate, ordering and performing treatments and interventions, ordering and review of laboratory studies, ordering and review of radiographic studies, pulse oximetry and re-evaluation of patient's condition.  Nanda Quinton, MD Emergency Medicine  ____________________________________________   INITIAL IMPRESSION / ASSESSMENT AND PLAN / ED COURSE  Pertinent labs & imaging results that were available during my care of the patient were reviewed by me and considered in my  medical decision making (see chart for details).  Patient presents to the emergency department for evaluation of shortness of breath and generalized weakness.  There is some report of maroon-colored stools.  Patient is on Xarelto.  Initial CBC shows worsening, normocytic anemia with hemoglobin of 7.2. Hemoccult positive, brown stool. No gross blood or melena. EKG with some nonspecific ST changes, likely anemia related. No STEMI.  The patient does have hypoxemia on arrival to the emergency department is requiring nasal cannula oxygen.  CXR showing PNA. Some report of fever at home. Patient on 2L O2. Starting abx and will order PRBC for transfusion.   Discussed patient's case with Family Medicine to request admission. Patient  and family (if present) updated with plan. Care transferred to [facility] Medicine service.  I reviewed all nursing notes, vitals, pertinent old records, EKGs, labs, imaging (as available).  ____________________________________________  FINAL CLINICAL IMPRESSION(S) / ED DIAGNOSES  Final diagnoses:  Community acquired pneumonia, unspecified laterality  Symptomatic anemia     MEDICATIONS GIVEN DURING THIS VISIT:  Medications  cefTRIAXone (ROCEPHIN) 2 g in sodium chloride 0.9 % 100 mL IVPB (0 g Intravenous Stopped 02/02/18 1523)  azithromycin (ZITHROMAX) 500 mg in sodium chloride 0.9 % 250 mL IVPB (0 mg Intravenous Stopped 02/02/18 1736)  pantoprazole (PROTONIX) EC tablet 40 mg (has no administration in time range)  feeding supplement (BOOST / RESOURCE BREEZE) liquid 1 Container (has no administration in time range)  sodium chloride 0.9 % bolus 500 mL (500 mLs Intravenous New Bag/Given 02/02/18 1754)  0.9 %  sodium chloride infusion (10 mL/hr Intravenous New Bag/Given 02/02/18 1453)     Note:  This document was prepared using Dragon voice recognition software and may include unintentional dictation errors.  Nanda Quinton, MD Emergency Medicine    Filbert Craze, [facility],  MD 02/02/18 1800

## 2018-02-02 NOTE — ED Triage Notes (Signed)
Pt come in complaining of SOBx5 days.  When she lays down at night she coughs.  Pt could not walk because she was tired.  She went to her Dr. Gabriel Carina and her SPO2 was 84% after standing.  AOx4 NAD noted at this time.

## 2018-02-02 NOTE — Progress Notes (Signed)
FPTS Interim Progress Note  S: Went to check on patient at the request of the day team. Patient reports that her breathing is feeling better than earlier. She hopes that it is even further improved tomorrow. She has no other complaints or requests at this time  O: BP 126/64   Pulse 88   Temp 99.1 F (37.3 C) (Oral)   Resp (!) 22   Ht 5\' 7"  (1.702 m)   Wt 59.9 kg   SpO2 97%   BMI 20.67 kg/m    PE Patient lying on her right side in bed. Nasal flaring, accessory muscle use with bilateral anterior shoulder movement with each inhale.   Lactate 2.14   A/P: Patient's O2 sats continue to remain improved. Currently on 2L [place]. Vitals are stable and there are no concerns for acute worsening at this time. Will continue to monitor sats and RR overnight. Patient received 500cc bolus earlier today for elevated lactic acid 2.14. Called 2W nurse's station for IStat Lactic acid ordered at 1519. Unable to be collected due to transfusion. Patient is currently being infused and her second bag is just starting.   Ordered strict I/Os.  Spoke to patient's nurse, asked to be paged with any acute worsening in setting of large volume input.   Wilber Oliphant, MD 02/02/2018, 10:25 PM PGY-1, [place] Medicine Service pager [phone]

## 2018-02-02 NOTE — ED Notes (Signed)
Placed on 2L of O2

## 2018-02-02 NOTE — H&P (Addendum)
[facility] Admission History and Physical Service Pager: [phone]  Patient name: Megan Munoz Medical record number: [id number] Date of birth: [birth year] Age: 84 y.o. Gender: female  Primary Care Provider: Fanny Bien, MD Consultants: None Code Status: DNR  Chief Complaint: This of breath  Assessment and Plan: [name] is a 84 y.o. female presenting with shortness of breath x5 days. PMH is significant for A.fib on anticoagulation, hx of PNA, lung nodules, CVA, HTN, HLD, resting tremor, dementia, and melena.   Shortness of breath: Patient complaining of some shortness of breath x5 days as well as cough when lying down at night. Patient's daughter reports fever of 100.7 F at home but patient is afebrile at 98.6 F in hospital without recent use of anti-pyretic. She has reduced ability to walk because she has been tired, but this also coincides with recent activity with physical therapy. Chest x-ray is suggestive of left upper lobe pneumonia and probable right upper lobe pneumonia currently developing.  Differential diagnosis includes pneumonia as most likely contributing factor to shortness of breath given findings of chest x-ray, with contributory component of symptomatic anemia as below.  Lactic acid elevated at 2.14- likely dehydration 2/2 poor PO as well as subacute bleeding. Another possible source includes an acute CHF exacerbation given cough and difficulty breathing with lying down, as well as BNP on admission is elevated to 500s, although no fluid on CXR.  Patient is on Xarelto, making pulmonary embolism less likely cause.  -Admit to stepdown 2/2 WOB, attending Dr. Gwendlyn Deutscher -Vitals per protocol -IV azithromycin and ceftriaxone -Continuous cardiac and pulse ox monitoring -Oxygen therapy to maintain O2 saturation greater than 92% -Up with assistance -Recheck lactic acid - 500cc bolus  HFpEF: last echo April 2017 shows EF 55-60% with mild inferolateral  hypokinesis, mild dilatation of atria. BNP elevated to 516 this admission, but elevation may be multifactorial. Takes Lasix 20mg  daily.  - Consider repeat echo  Atrial fibrillation: patient in A.fib on examination but rate controlled at 80bpm. She takes Diltiazem 120mg  TID, Carvedilol 12.5mg  BID, and Xarelto 20mg  daily with dinner. - Continue diltiazem and carvedilol - Hold Xarelto  Anemia: Patient has a history of GI bleed and presented with positive FOBT, as well as hemoglobin of 7.2.  Patient is on Xarelto.  She currently denies dark, tarry stools, as well as bright red blood per rectum. This is likely contributing to the patient's shortness of breath.  Lactic acid is also elevated at 2.14. -Transfuse 2 units PRBCs -Posttransfusion H&H -Hold Xarelto -SCDs -Monitor vitals -recheck lactic acid -GI consulted for 11/7 am, call if clinical status changes, otherwise anticipate scope 11/8; recommended protonix BID  Hypertension: Patient has a history of blood pressure, which was 128/60 on admission.  Patient is taking diltiazem and carvedilol. -continue meds as above  Hyperlipidemia: patient has a history of hyperlipidemia and is on simvastatin 40mg  daily. - Continue statin - Repeat Lipid Panel  Dementia: patient with history of dementia as well as a stroke. Family denies acute changes to patient's cognition and level of function. She is alert, oriented, and cooperative on exam. She takes memantine 10mg  BID, as well as Prozac 20 mg.. -hold aricept and namenda  Depression: stable at present.  -Continue Prozac  GERD: patient with history of GERD.  No complaints on interview in emergency department. -Protonix  FEN/GI: Clear liquid diet, Colace Prophylaxis: SCDs  Disposition: Admit to med-surg  History of Present Illness:  Megan Munoz is a 84 y.o. female  presenting with 5 days of worsening shortness of breath, including cough while lying down. The majority of history is provided by the  patient's daughter.  The patient lives with the daughter and her husband, who is currently sick and has been in contact with the patient. The daughter took a temperature at home and was found to be 100.7 F.  She did not provide the patient with any fever reducer.  The patient also reports a cough in the evening with occasional sputum production, as well as a stuffy nose.  The patient has had at least one episode of pneumonia in the past, as well as one episode of CHF exacerbation.  She denies recent weight gain and travel.   She also reports the patient has recently started working with physical therapy again in order to improve her strength and mobility.  She performs all of her ADLs independently, but is unable to perform her own IADLs.  The patient's daughter is more concerned the patient is having maroon or burgundy colored stools and that her hemoglobin has dropped from 12.9 in September to 7.2 today on admission.  The patient thought her PCP was going to send her to a gastroenterologist for a colonoscopy, but she is unsure.  Review Of Systems: Per HPI with the following additions:   Review of Systems  Constitutional: Positive for fever and malaise/fatigue.  HENT: Positive for congestion.   Respiratory: Positive for cough, sputum production and shortness of breath.   Cardiovascular: Positive for leg swelling. Negative for chest pain.  Gastrointestinal: Positive for blood in stool. Negative for abdominal pain, diarrhea, nausea and vomiting.  Genitourinary: Negative for dysuria.  Neurological: Positive for tremors (chronic).  Psychiatric/Behavioral: Positive for memory loss.   Patient Active Problem List   Diagnosis Date Noted  . CAP (community acquired pneumonia) 02/02/2018  . Tremor 07/21/2017  . Insomnia 07/21/2017  . Dementia without behavioral disturbance ([place]) 07/21/2016  . Gait abnormality 07/21/2016  . Recurrent pneumonia 12/06/2015  . Multiple lung nodules on CT 11/01/2015  .  Chronic diastolic CHF (congestive heart failure) ([place]) 10/30/2015  . Bilateral lower extremity edema 08/05/2015  . Lobar pneumonia due to unspecified organism 07/30/2015  . Malnutrition of moderate degree 07/29/2015  . Hyponatremia 07/25/2015  . Anemia of chronic disease 07/25/2015  . Essential hypertension 07/22/2014  . Permanent atrial fibrillation ([place]): CHA2DS2-VASc Score 7 - on Xarelto 07/07/2014  . Hyperlipidemia 07/07/2014  . H/O: stroke 10/29/2008    Past Medical History: Past Medical History:  Diagnosis Date  . Anxiety   . Atherosclerosis of aortic arch [facility]) April 2017   Noted on CT chest, calcified aortic atherosclerosis at the arch.  . Atrial fibrillation, permanent 03/2013   Previously on Atenolol for Rate - now on combination diltiazem and carvedilol for rate Control;  & Xarelto for AC. CHADS2VASC2 = 7.  . Cerebral infarction [facility]) 10/2008   MRI Brain 05/2014 for near syncope revealed: No acure infarct.  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   Mild small vessel disease type changes.  No intracranial hemorrhage.  Global atrophy.   . CHF (congestive heart failure) ([place])   . Coronary atherosclerosis due to calcified coronary lesion April 2017   Noted on CT chest  . Hyperlipemia   . Hypertension   . Tinea unguium   . Tremor     Past Surgical History: Past Surgical History:  Procedure Laterality Date  .  BLADDER SURGERY    . CARPAL TUNNEL RELEASE Left   . FOOT SURGERY Right   . HERNIA REPAIR    . NASAL SINUS SURGERY    . TOTAL ABDOMINAL HYSTERECTOMY      Social History: Social History   Tobacco Use  . Smoking status: Former Smoker    Packs/day: 1.00    Years: 16.00    Pack years: 16.00    Start date: 03/30/1954    Last attempt to quit: 03/30/1970    Years since quitting: 47.8  . Smokeless tobacco: Never Used  . Tobacco comment: smoked 1/2-1 ppd  Substance Use  Topics  . Alcohol use: No    Alcohol/week: 0.0 standard drinks  . Drug use: No   Additional social history:   Please also refer to relevant sections of EMR.  Family History: Family History  Problem Relation Age of Onset  . Osteoporosis Mother   . Emphysema Father   . Lung disease Father        Black Lung  . Stroke Brother   . Cancer Maternal Grandmother   . Emphysema Paternal Grandmother   . Asthma Daughter        allergy induced   Allergies and Medications: Allergies  Allergen Reactions  . Tetanus Toxoids Other (See Comments)    Told never to take    No current facility-administered medications on file prior to encounter.    Current Outpatient Medications on File Prior to Encounter  Medication Sig Dispense Refill  . ANUCORT-HC 25 MG suppository UNW AND I 1 SUP REC 1 TIME PER DAY FOR 1 WK  0  . B Complex Vitamins (VITAMIN B COMPLEX PO) Take 1 tablet by mouth daily.     . carvedilol (COREG) 12.5 MG tablet TAKE 1 TABLET TWICE A DAY WITH MEALS (DUE FOR FOLLOW UP APPOINTMENT IN JUNE) 180 tablet 4  . carvedilol (COREG) 12.5 MG tablet TAKE 1 TABLET TWICE A DAY WITH MEALS (DUE FOR FOLLOW UP APPOINTMENT IN JUNE) 180 tablet 2  . Cholecalciferol (VITAMIN D3 SUPER STRENGTH) 2000 units CAPS Take by mouth.    . diltiazem (CARDIZEM CD) 120 MG 24 hr capsule TAKE 3 CAPSULES DAILY (PLEASE SCHEDULE AN APPOINTMENT FOR FURTHER REFILLS) 360 capsule 4  . diphenhydrAMINE (SOMINEX) 25 MG tablet Take 50 mg by mouth at bedtime.     . donepezil (ARICEPT) 10 MG tablet Take 1 tablet (10 mg total) by mouth at bedtime. 90 tablet 3  . feeding supplement, ENSURE ENLIVE, (ENSURE ENLIVE) LIQD Take 237 mLs by mouth 3 (three) times daily between meals. 237 mL 12  . ferrous sulfate 325 (65 FE) MG tablet Take 325 mg by mouth daily with breakfast.    . FLUoxetine (PROZAC) 20 MG capsule Take 20 mg by mouth daily.    . furosemide (LASIX) 20 MG tablet Take 1 tablet (20 mg total) by mouth daily as needed for fluid or  edema. 30 tablet 6  . loratadine (CLARITIN) 10 MG tablet Take 10 mg by mouth daily.    . Melatonin 10 MG TABS Take 10 mg by mouth at bedtime.     . memantine (NAMENDA) 10 MG tablet Take 1 tablet (10 mg total) by mouth 2 (two) times daily. 180 tablet 3  . Multiple Vitamin (MULTIVITAMIN) capsule Take 1 capsule by mouth daily.    . rivaroxaban (XARELTO) 20 MG TABS tablet Take 20 mg by mouth daily with supper.    . simvastatin (ZOCOR) 40 MG tablet Take 40 mg  by mouth daily.      Objective: BP (!) 111/53   Pulse 76   Temp 98.6 F (37 C) (Oral)   Resp (!) 23   Ht 5\' 7"  (1.702 m)   Wt 59.9 kg   SpO2 98%   BMI 20.67 kg/m   Physical Exam  Constitutional: She is oriented to person, place, and time. She appears well-developed. She appears ill.  Frail, cachectic  HENT:  Head: Normocephalic.  Mouth/Throat: Oropharynx is clear and moist. No oropharyngeal exudate or posterior oropharyngeal edema.  Eyes: EOM are normal.  Neck: Normal range of motion.  Cardiovascular: Normal rate, normal heart sounds and intact distal pulses.  No murmur heard. Irregular rhythm  Pulmonary/Chest:  Increased work of breathing, wheezes throughout, coarse breath sounds in bilateral upper lobes  Abdominal: Soft. Bowel sounds are normal. She exhibits no distension. There is no tenderness.  Musculoskeletal:       Right lower leg: Normal.       Left lower leg: Normal.  Lymphadenopathy:    She has no cervical adenopathy.  Neurological: She is alert and oriented to person, place, and time.  Skin: Skin is warm and dry. Capillary refill takes less than 2 seconds.  Psychiatric: She has a normal mood and affect.   Labs and Imaging: CBC BMET  Recent Labs  Lab 02/02/18 1252  WBC 8.9  HGB 7.2*  HCT 24.3*  PLT 241   Recent Labs  Lab 02/02/18 1252  NA 136  K 3.7  CL 102  CO2 25  BUN 15  CREATININE 0.68  GLUCOSE 206*  CALCIUM 8.8*     Lactic acid: 2.14 FOBT: Positive Reticulocyte count: 3.8  elevated Ferritin: 35 normal Iron: 9 decreased BNP: 516.8 Troponin negative x1 Venous blood: Venous O2 29 decreased Vitamin B12: Elevated at 1939 Folate: 44.1 normal  Dg Chest 2 View  Result Date: 02/02/2018 CLINICAL DATA:  Cough and shortness of breath and intermittent fever for 5 days. Shortness of breath is worsening. History of CHF, previous CVA, former smoker. EXAM: CHEST - 2 VIEW COMPARISON:  Chest x-ray of March 23, 2018 FINDINGS: The lungs are mildly hyperinflated. There is confluent airspace opacity in the left upper lobe a. There is patchy increased density in the right perihilar and suprahilar regions. The heart is top-normal in size. The pulmonary vascularity is not engorged. There is no pleural effusion. The bony thorax exhibits no acute abnormality. IMPRESSION: Left upper lobe pneumonia. Probable developing right upper lobe pneumonia. Followup PA and lateral chest X-ray is recommended in 3-4 weeks following trial of antibiotic therapy to ensure resolution and exclude underlying malignancy. Top-normal cardiac size without evidence of CHF. Thoracic aortic atherosclerosis. Electronically Signed   By: David  Martinique M.D.   On: 02/02/2018 13:37    Daisy Floro, DO 02/02/2018, 3:34 PM PGY-1, [place] Intern pager: [phone], text pages welcome  FPTS Upper-Level Resident Addendum   I have independently interviewed and examined the patient. I have discussed the above with the original author and agree with their documentation. My edits for correction/addition/clarification are in blue. Please see also any attending notes.    Ralene Ok, MD PGY-3, [place] Service pager: [phone] (text pages welcome through [facility])

## 2018-02-03 ENCOUNTER — Inpatient Hospital Stay (HOSPITAL_COMMUNITY): Payer: Medicare Other

## 2018-02-03 DIAGNOSIS — G9389 Other specified disorders of brain: Secondary | ICD-10-CM

## 2018-02-03 DIAGNOSIS — I482 Chronic atrial fibrillation, unspecified: Secondary | ICD-10-CM

## 2018-02-03 DIAGNOSIS — D649 Anemia, unspecified: Secondary | ICD-10-CM

## 2018-02-03 DIAGNOSIS — I5043 Acute on chronic combined systolic (congestive) and diastolic (congestive) heart failure: Secondary | ICD-10-CM

## 2018-02-03 DIAGNOSIS — J432 Centrilobular emphysema: Secondary | ICD-10-CM

## 2018-02-03 DIAGNOSIS — J439 Emphysema, unspecified: Secondary | ICD-10-CM | POA: Diagnosis present

## 2018-02-03 DIAGNOSIS — J9601 Acute respiratory failure with hypoxia: Secondary | ICD-10-CM

## 2018-02-03 DIAGNOSIS — K922 Gastrointestinal hemorrhage, unspecified: Secondary | ICD-10-CM

## 2018-02-03 LAB — BASIC METABOLIC PANEL
ANION GAP: 4 — AB (ref 5–15)
BUN: 14 mg/dL (ref 8–23)
CO2: 28 mmol/L (ref 22–32)
Calcium: 8.6 mg/dL — ABNORMAL LOW (ref 8.9–10.3)
Chloride: 108 mmol/L (ref 98–111)
Creatinine, Ser: 0.58 mg/dL (ref 0.44–1.00)
GLUCOSE: 124 mg/dL — AB (ref 70–99)
POTASSIUM: 3.7 mmol/L (ref 3.5–5.1)
Sodium: 140 mmol/L (ref 135–145)

## 2018-02-03 LAB — BPAM RBC
Blood Product Expiration Date: 201911302359
Blood Product Expiration Date: 201911302359
ISSUE DATE / TIME: 201911061835
ISSUE DATE / TIME: 201911062231
UNIT TYPE AND RH: 600
Unit Type and Rh: 600

## 2018-02-03 LAB — CBC
HCT: 31.1 % — ABNORMAL LOW (ref 36.0–46.0)
HEMATOCRIT: 30.6 % — AB (ref 36.0–46.0)
HEMOGLOBIN: 9.5 g/dL — AB (ref 12.0–15.0)
Hemoglobin: 9.6 g/dL — ABNORMAL LOW (ref 12.0–15.0)
MCH: 29 pg (ref 26.0–34.0)
MCH: 29.4 pg (ref 26.0–34.0)
MCHC: 30.9 g/dL (ref 30.0–36.0)
MCHC: 31 g/dL (ref 30.0–36.0)
MCV: 94 fL (ref 80.0–100.0)
MCV: 94.7 fL (ref 80.0–100.0)
NRBC: 0.5 % — AB (ref 0.0–0.2)
PLATELETS: 208 10*3/uL (ref 150–400)
Platelets: 212 10*3/uL (ref 150–400)
RBC: 3.31 MIL/uL — AB (ref 3.87–5.11)
RBC: 3.23 MIL/uL — AB (ref 3.87–5.11)
RDW: 14.5 % (ref 11.5–15.5)
RDW: 14.9 % (ref 11.5–15.5)
WBC: 8.7 10*3/uL (ref 4.0–10.5)
WBC: 8.2 10*3/uL (ref 4.0–10.5)
nRBC: 0.4 % — ABNORMAL HIGH (ref 0.0–0.2)

## 2018-02-03 LAB — TYPE AND SCREEN
ABO/RH(D): A NEG
Antibody Screen: NEGATIVE
UNIT DIVISION: 0
Unit division: 0

## 2018-02-03 LAB — LACTIC ACID, PLASMA
Lactic Acid, Venous: 0.9 mmol/L (ref 0.5–1.9)
Lactic Acid, Venous: 0.9 mmol/L (ref 0.5–1.9)
Lactic Acid, Venous: 2 mmol/L (ref 0.5–1.9)

## 2018-02-03 LAB — HIV ANTIBODY (ROUTINE TESTING W REFLEX): HIV Screen 4th Generation wRfx: NONREACTIVE

## 2018-02-03 MED ORDER — METOPROLOL TARTRATE 50 MG PO TABS
50.0000 mg | ORAL_TABLET | Freq: Two times a day (BID) | ORAL | Status: DC
  Administered 2018-02-03 – 2018-02-07 (×7): 50 mg via ORAL
  Filled 2018-02-03 (×8): qty 1

## 2018-02-03 MED ORDER — PREDNISONE 20 MG PO TABS: 40.0000 mg | ORAL_TABLET | Freq: Every day | ORAL | Status: DC

## 2018-02-03 MED ORDER — FUROSEMIDE 20 MG PO TABS
20.0000 mg | ORAL_TABLET | Freq: Every day | ORAL | Status: DC
  Administered 2018-02-03: 20 mg via ORAL
  Filled 2018-02-03: qty 1

## 2018-02-03 MED ORDER — PREDNISONE 20 MG PO TABS
40.0000 mg | ORAL_TABLET | Freq: Every day | ORAL | Status: DC
  Administered 2018-02-03 – 2018-02-07 (×4): 40 mg via ORAL
  Filled 2018-02-03 (×5): qty 2

## 2018-02-03 MED ORDER — IPRATROPIUM-ALBUTEROL 0.5-2.5 (3) MG/3ML IN SOLN
3.0000 mL | Freq: Four times a day (QID) | RESPIRATORY_TRACT | Status: DC
  Administered 2018-02-03 – 2018-02-04 (×5): 3 mL via RESPIRATORY_TRACT
  Filled 2018-02-03 (×6): qty 3

## 2018-02-03 MED ORDER — FUROSEMIDE 10 MG/ML IJ SOLN
20.0000 mg | Freq: Every day | INTRAMUSCULAR | Status: DC
  Administered 2018-02-04 – 2018-02-06 (×3): 20 mg via INTRAVENOUS
  Filled 2018-02-03 (×3): qty 2

## 2018-02-03 MED ORDER — FUROSEMIDE 10 MG/ML IJ SOLN
20.0000 mg | Freq: Once | INTRAMUSCULAR | Status: AC
  Administered 2018-02-03: 20 mg via INTRAVENOUS
  Filled 2018-02-03: qty 2

## 2018-02-03 NOTE — Discharge Summary (Signed)
[facility] Discharge Summary  Patient name: Megan Munoz Medical record number: [id number] Date of birth: 06/12/1932 Age: 84 y.o. Gender: female Date of Admission: 02/02/2018  Date of Discharge: 02/07/18 Admitting Physician: Kinnie Feil, MD  Primary Care Provider: Fanny Bien, MD Consultants: GI  Indication for Hospitalization: Shortness of breath  Discharge Diagnoses/Problem List:  Atrial fibrillation on anticoagulation Lung nodules CVA Hypertension Hyperlipidemia Resting tremor Dementia  Disposition: Discharge home  Discharge Condition: Stable  Discharge Exam:  Physical Exam:  Gen: NAD, alert, non-toxic, well-appearing, sitting comfortably eating breakfast Skin: Warm and dry. No obvious rashes, lesions, or trauma. HEENT: NCAT No conjunctival pallor or injection. No scleral icterus or injection.  MMM.  CV: RRR.  Normal S1-S2. RP & DPs 2+ bilaterally. No BLEE. Resp: CTAB.  No wheezing, rales, abnormal lung sounds.    Patient continues to shrug shoulders with inhalalation. Abd: NTND on palpation to all 4 quadrants.  Positive bowel sounds. Psych: Cooperative with exam. Pleasant. Makes eye contact. Speech normal. Extremities: Moves all extremities spontaneously. Resting tremors in both hands.  Neuro: CN II-XII grossly intact. No FNDs.   Brief Hospital Course:  Megan Munoz (pronounced "Vicente Masson") is an 84 year old female admitted for 5 days of worsening shortness of breath.  Chest x-ray performed on admission was suggestive of left upper lobe pneumonia with probable right upper lobe pneumonia.  She was started on IV azithromycin and ceftriaxone, as well as duo nebs.   On admission the patient was found to be anemic with hemoglobin 7.2. BNP was noted to be 516. The patient was bolused 500 cc normal saline and transfused 2 units PRBCs and her hemoglobin responded well to 9.6.  Anemia studies performed after the transfusion revealed iron deficiency  anemia, but these findings were not treated due to recent transfusion, as well as current pneumonia.  After the transfusion, the patient developed mild crackles.  Cardiology was consulted and recommended Lasix and repeat echocardiogram. While on Lasix and antibiotics, the patient's shortness of breath improved. She was also given steroids to further improve breathing.  Her antibiotics were switched to p.o. Cefdinir and azithromycin.  Echo revealed only slightly worse ejection fraction at 50 to 55% with diffuse hypokinesis.  An EGD was performed to explore causes of the patient's anemia, but only found localized mildly erythematous mucosa without bleeding in the gastric antrum, a previously swallowed piece of paper in the patient's esophagus.  She was medically cleared by gastroenterology to restart her Xarelto.  Per chart review, patient's previous vitals were suspicious for underlying COPD.  No maintenance medication was started while she was inpatient. Other hospital findings included a lytic lesion on S3.  An SPEP was ordered and should be followed up outpatient. At discharge, patient was instructed to continue her home dosing of Lasix 20 mg every other day as needed for fluid or edema as her volume status was within normal limits.  Patient was also switched to atorvastatin 20 mg.  Issues for Follow Up:  1. Follow up blood levels regarding anemia. Patient may benefit from oral iron supplementation outpatient, 325 mg iron every other day. 2. Symptoms of fluid overload 3. Breathing status 4. SPEP result for lytic lesion of sacrum on S3 5. Starting maintenance medication for COPD  Significant Procedures:  EGD - 11/8  Significant Labs and Imaging:  Recent Labs  Lab 02/02/18 1252 02/03/18 0355  WBC 8.9 8.7  HGB 7.2* 9.6*  HCT 24.3* 31.1*  PLT 241 208   Recent  Labs  Lab 02/02/18 1252 02/03/18 0355  NA 136 140  K 3.7 3.7  CL 102 108  CO2 25 28  GLUCOSE 206* 124*  BUN 15 14  CREATININE  0.68 0.58  CALCIUM 8.8* 8.6*     Results/Tests Pending at Time of Discharge:  SPEP PENDING  Discharge Medications:  Allergies as of 02/07/2018      Reactions   Tetanus Toxoids Other (See Comments)   Told never to take       Medication List    STOP taking these medications   diphenhydrAMINE 25 MG tablet Commonly known as:  SOMINEX   simvastatin 40 MG tablet Commonly known as:  ZOCOR     TAKE these medications   atorvastatin 20 MG tablet Commonly known as:  LIPITOR Take 1 tablet (20 mg total) by mouth daily at 6 PM.   azelastine 0.1 % nasal spray Commonly known as:  ASTELIN Place 2 sprays into both nostrils 2 (two) times daily. Use in each nostril as directed   carvedilol 12.5 MG tablet Commonly known as:  COREG TAKE 1 TABLET TWICE A DAY WITH MEALS (DUE FOR FOLLOW UP APPOINTMENT IN [place]) What changed:    See the new instructions.  Another medication with the same name was removed. Continue taking this medication, and follow the directions you see here.   cefdinir 300 MG capsule Commonly known as:  OMNICEF Take 1 capsule (300 mg total) by mouth every 12 (twelve) hours for 1 day.   diltiazem 120 MG 24 hr capsule Commonly known as:  CARDIZEM CD TAKE 3 CAPSULES DAILY (PLEASE SCHEDULE AN APPOINTMENT FOR FURTHER REFILLS) What changed:  See the new instructions.   docusate sodium 100 MG capsule Commonly known as:  COLACE Take 200 mg by mouth 2 (two) times daily.   donepezil 10 MG tablet Commonly known as:  ARICEPT Take 1 tablet (10 mg total) by mouth at bedtime.   feeding supplement (ENSURE ENLIVE) Liqd Take 237 mLs by mouth 3 (three) times daily between meals.   ferrous sulfate 325 (65 FE) MG tablet Take 1 tablet (325 mg total) by mouth every other day. What changed:  when to take this   FLUoxetine 20 MG capsule Commonly known as:  PROZAC Take 20 mg by mouth daily.   furosemide 20 MG tablet Commonly known as:  LASIX Take 1 tablet (20 mg total) by mouth  daily as needed for fluid or edema. What changed:  when to take this   loratadine 10 MG tablet Commonly known as:  CLARITIN Take 10 mg by mouth daily.   Melatonin 10 MG Tabs Take 10 mg by mouth at bedtime.   memantine 10 MG tablet Commonly known as:  NAMENDA Take 1 tablet (10 mg total) by mouth 2 (two) times daily.   MIACALCIN 200 UNIT/ACT nasal spray Generic drug:  calcitonin (salmon) Place 1 spray into alternate nostrils daily.   multivitamin capsule Take 1 capsule by mouth daily.   predniSONE 20 MG tablet Commonly known as:  DELTASONE Take 2 tablets (40 mg total) by mouth daily before breakfast. Start taking on:  02/08/2018   Rivaroxaban 15 MG Tabs tablet Commonly known as:  XARELTO Take 1 tablet (15 mg total) by mouth daily with supper. What changed:    medication strength  how much to take   VITAMIN B COMPLEX PO Take 1 tablet by mouth daily.   VITAMIN D3 SUPER STRENGTH 50 MCG (2000 UT) Caps Generic drug:  Cholecalciferol Take 2,000 Units  by mouth daily.       Discharge Instructions: Please refer to Patient Instructions section of EMR for full details.  Patient was counseled important signs and symptoms that should prompt return to medical care, changes in medications, dietary instructions, activity restrictions, and follow up appointments.   Follow-Up Appointments: Patient was instructed to make a follow-up appointment with her PCP  Wilber Oliphant, MD 02/07/2018, 2:08 PM PGY-1, [place]

## 2018-02-03 NOTE — H&P (View-Only) (Signed)
[facility] Consult  Referring Provider: Family Medicine Teaching service/Kehinde Eniola,MD Primary Care Physician:  Fanny Bien, MD Primary Gastroenterologist: Althia Forts  Reason for Consultation:  Anemia,FOBT positive stools  HPI: Megan Munoz is a 84 y.o. female was admitted yesterday with progressively worsening shortness of breath for 5 days along with low-grade fever of 100.7, weakness and findings of left upper lobe pneumonia on chest x-ray. We were consulted as patient presented with a hemoglobin of 7.2,likely contributing to exertional dyspnea, FOBT positive stool. Patient denies prior GI workup, no prior EGD or colonoscopy. For the last 2 months she reports dark green/black stools, several episodes on a daily basis but denies bright red blood per rectum. She denies unintentional weight loss but complains of decreased appetite. Denies difficulty in swallowing or pain on swallowing. Denies abdominal pain. She is on Xarelto for atrial fibrillation,last dose was yesterday evening. Denies use of NSAIDs or aspirin. She has been on ferrous sulfate several months for ongoing anemia, last hemoglobin in 2017 was 9.8. She has received 2 units of PRBC transfusion yesterday.  Past Medical History:  Diagnosis Date  . Anxiety   . Atherosclerosis of aortic arch [facility]) April 2017   Noted on CT chest, calcified aortic atherosclerosis at the arch.  . Atrial fibrillation, permanent 03/2013   Previously on Atenolol for Rate - now on combination diltiazem and carvedilol for rate Control;  & Xarelto for AC. CHADS2VASC2 = 7.  . Cerebral infarction [facility]) 10/2008   MRI Brain 05/2014 for near syncope revealed: No acure infarct.  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   Mild small vessel disease type changes.  No intracranial hemorrhage.  Global atrophy.   . CHF (congestive heart failure) ([place])   .  Coronary atherosclerosis due to calcified coronary lesion April 2017   Noted on CT chest  . Hyperlipemia   . Hypertension   . Tinea unguium   . Tremor     Past Surgical History:  Procedure Laterality Date  . BLADDER SURGERY    . CARPAL TUNNEL RELEASE Left   . FOOT SURGERY Right   . HERNIA REPAIR    . NASAL SINUS SURGERY    . TOTAL ABDOMINAL HYSTERECTOMY      Prior to Admission medications   Medication Sig Start Date End Date Taking? Authorizing Provider  azelastine (ASTELIN) 0.1 % nasal spray Place 2 sprays into both nostrils 2 (two) times daily. Use in each nostril as directed   Yes [provider]  B Complex Vitamins (VITAMIN B COMPLEX PO) Take 1 tablet by mouth daily.    Yes [provider]  calcitonin, salmon, (MIACALCIN) 200 UNIT/ACT nasal spray Place 1 spray into alternate nostrils daily.   Yes [provider]  carvedilol (COREG) 12.5 MG tablet TAKE 1 TABLET TWICE A DAY WITH MEALS (DUE FOR FOLLOW UP APPOINTMENT IN [place]) Patient taking differently: Take 12.5 mg by mouth 2 (two) times daily with a meal.  12/01/17  Yes Leonie Man, MD  Cholecalciferol (VITAMIN D3 SUPER STRENGTH) 2000 units CAPS Take 2,000 Units by mouth daily.    Yes [provider]  diltiazem (CARDIZEM CD) 120 MG 24 hr capsule TAKE 3 CAPSULES DAILY (PLEASE SCHEDULE AN APPOINTMENT FOR FURTHER REFILLS) Patient taking differently: Take 240 mg by mouth daily.  12/20/17  Yes Leonie Man, MD  diphenhydrAMINE (SOMINEX) 25 MG tablet Take 50 mg by mouth at bedtime.  Yes [provider]  docusate sodium (COLACE) 100 MG capsule Take 200 mg by mouth 2 (two) times daily.   Yes [provider]  donepezil (ARICEPT) 10 MG tablet Take 1 tablet (10 mg total) by mouth at bedtime. 07/21/17  Yes Dennie Bible, NP  feeding supplement, ENSURE ENLIVE, (ENSURE ENLIVE) LIQD Take 237 mLs by mouth 3 (three) times daily between meals. 07/30/15  Yes Dhungel, Nishant, MD   ferrous sulfate 325 (65 FE) MG tablet Take 325 mg by mouth daily with breakfast.   Yes [provider]  FLUoxetine (PROZAC) 20 MG capsule Take 20 mg by mouth daily.   Yes [provider]  furosemide (LASIX) 20 MG tablet Take 1 tablet (20 mg total) by mouth daily as needed for fluid or edema. Patient taking differently: Take 20 mg by mouth every other day.  08/05/15  Yes Leonie Man, MD  loratadine (CLARITIN) 10 MG tablet Take 10 mg by mouth daily.   Yes [provider]  Melatonin 10 MG TABS Take 10 mg by mouth at bedtime.    Yes [provider]  memantine (NAMENDA) 10 MG tablet Take 1 tablet (10 mg total) by mouth 2 (two) times daily. 07/21/17  Yes Dennie Bible, NP  Multiple Vitamin (MULTIVITAMIN) capsule Take 1 capsule by mouth daily.   Yes [provider]  rivaroxaban (XARELTO) 20 MG TABS tablet Take 20 mg by mouth daily with supper.   Yes [provider]  simvastatin (ZOCOR) 40 MG tablet Take 40 mg by mouth daily at 6 PM.    Yes [provider]  carvedilol (COREG) 12.5 MG tablet TAKE 1 TABLET TWICE A DAY WITH MEALS (DUE FOR FOLLOW UP APPOINTMENT IN [place]) Patient not taking: Reported on 02/02/2018 12/01/17   Leonie Man, MD    Current Facility-Administered Medications  Medication Dose Route Frequency Provider Last Rate Last Dose  . atorvastatin (LIPITOR) tablet 20 mg  20 mg Oral q1800 Long, Wonda Olds, MD      . azithromycin (ZITHROMAX) 500 mg in sodium chloride 0.9 % 250 mL IVPB  500 mg Intravenous Q24H Sela Hilding, MD   Stopped at 02/02/18 1736  . calcitonin (salmon) (MIACALCIN/FORTICAL) nasal spray 1 spray  1 spray Alternating Nares Daily Sela Hilding, MD   1 spray at 02/03/18 [phone]  . carvedilol (COREG) tablet 12.5 mg  12.5 mg Oral BID WC Sela Hilding, MD   12.5 mg at 02/03/18 0837  . cefTRIAXone (ROCEPHIN) 2 g in sodium chloride 0.9 % 100 mL IVPB  2 g Intravenous Q24H Sela Hilding, MD    Stopped at 02/02/18 1523  . diltiazem (CARDIZEM CD) 24 hr capsule 240 mg  240 mg Oral Daily Sela Hilding, MD   240 mg at 02/03/18 0837  . docusate sodium (COLACE) capsule 200 mg  200 mg Oral BID Sela Hilding, MD   200 mg at 02/03/18 0837  . feeding supplement (BOOST / RESOURCE BREEZE) liquid 1 Container  1 Container Oral TID BM Sela Hilding, MD   1 Container at 02/03/18 [phone]  . FLUoxetine (PROZAC) capsule 20 mg  20 mg Oral Daily Sela Hilding, MD   20 mg at 02/03/18 0837  . pantoprazole (PROTONIX) EC tablet 40 mg  40 mg Oral BID Sela Hilding, MD   40 mg at 02/03/18 2633    Allergies as of 02/02/2018 - Review Complete 02/02/2018  Allergen Reaction Noted  . Tetanus toxoids Other (See Comments) 05/25/2014    Family History  Problem Relation Age of Onset  . Osteoporosis Mother   . Emphysema Father   . Lung disease Father        Black Lung  . Stroke Brother   . Cancer Maternal Grandmother   . Emphysema Paternal Grandmother   . Asthma Daughter        allergy induced    Social History   Socioeconomic History  . Marital status: Widowed    Spouse name: Not on file  . Number of children: 1  . Years of education: 66  . Highest education level: Not on file  Occupational History  . Occupation: Retired  Scientific laboratory technician  . Financial resource strain: Not on file  . Food insecurity:    Worry: Not on file    Inability: Not on file  . Transportation needs:    Medical: Not on file    Non-medical: Not on file  Tobacco Use  . Smoking status: Former Smoker    Packs/day: 1.00    Years: 16.00    Pack years: 16.00    Start date: 03/30/1954    Last attempt to quit: 03/30/1970    Years since quitting: 47.8  . Smokeless tobacco: Never Used  . Tobacco comment: smoked 1/2-1 ppd  Substance and Sexual Activity  . Alcohol use: No    Alcohol/week: 0.0 standard drinks  . Drug use: No  . Sexual activity: Not on file  Lifestyle  . Physical activity:    Days per week:  Not on file    Minutes per session: Not on file  . Stress: Not on file  Relationships  . Social connections:    Talks on phone: Not on file    Gets together: Not on file    Attends religious service: Not on file    Active member of club or organization: Not on file    Attends meetings of clubs or organizations: Not on file    Relationship status: Not on file  . Intimate partner violence:    Fear of current or ex partner: Not on file    Emotionally abused: Not on file    Physically abused: Not on file    Forced sexual activity: Not on file  Other Topics Concern  . Not on file  Social History Narrative   Widow.  Lives with her daughter & her family.  (Son-in-law & 5 Grandsons).   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   2-4 cups caffeine/day   1-2 glassess of wine/beer  Per week.  Never smoked.      [facility]:   Originally from [place]. She lived in [place], [place], [place], & [place]. 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 No bird exposure. No mold or hot tub exposure.     Review of Systems: Positive for: GI: Described in detail in HPI.    Gen: anorexia, fatigue, weakness, malaise, Denies any fever, chills, rigors, night sweats, involuntary weight loss, and sleep disorder CV: Denies chest pain, angina, palpitations, syncope, orthopnea, PND, peripheral edema, and claudication. Resp: dyspnea, wheezing, Denies cough, sputum, coughing up blood. GU : Denies urinary burning, blood in urine, urinary frequency, urinary hesitancy, nocturnal urination, and urinary incontinence. MS: Denies joint pain or swelling.  Denies muscle weakness, cramps, atrophy.  Derm: Denies rash, itching, oral ulcerations, hives, unhealing ulcers.  Psych: Denies depression, anxiety, memory loss, suicidal ideation, hallucinations,  and confusion. Heme: Denies bruising and enlarged lymph nodes. Neuro:  Denies any  headaches, dizziness, paresthesias. Endo:  Denies  any problems with DM, thyroid, adrenal function.  Physical Exam: Vital signs in last 24 hours: Temp:  [98.1 F (36.7 C)-99.8 F (37.7 C)] 98.1 F (36.7 C) (11/07 0846) Pulse Rate:  [30-138] 97 (11/07 0846) Resp:  [20-35] 20 (11/07 0846) BP: (111-150)/(51-106) 130/70 (11/07 0846) SpO2:  [84 %-100 %] 93 % (11/07 0846) Weight:  [59.9 kg] 59.9 kg (11/06 1245) Last BM Date: 02/01/18  General:   Elderly, thinly built, Frail,not able to speak in full sentences, on oxygen via nasal cannula at 3 L/m, using accessory muscles of respiration Head:  Normocephalic and atraumatic. Eyes:  Sclera clear, no icterus.   Obvious pallor. Ears:  Normal auditory acuity. Nose:  No deformity, discharge,  or lesions. Mouth:  No deformity or lesions.  Oropharynx pink & moist. Neck:  Supple; no masses or thyromegaly. Lungs:  Decreased breath sound in the left upper lung field along with wheezing, normal breath sounds in right lung field Heart:  Irregular rate and rhythm Extremities:  Mild bipedal pitting edema Neurologic:  Alert and  oriented x4;  grossly normal neurologically. Skin:  Intact without significant lesions or rashes. Psych:  Alert and cooperative. Normal mood and affect. Abdomen:  Soft, nontender and nondistended. No masses, hepatosplenomegaly or hernias noted. Normal bowel sounds, without guarding, and without rebound.         Lab Results: Recent Labs    02/02/18 1252 02/03/18 0355  WBC 8.9 8.7  HGB 7.2* 9.6*  HCT 24.3* 31.1*  PLT 241 208   BMET Recent Labs    02/02/18 1252 02/03/18 0355  NA 136 140  K 3.7 3.7  CL 102 108  CO2 25 28  GLUCOSE 206* 124*  BUN 15 14  CREATININE 0.68 0.58  CALCIUM 8.8* 8.6*   LFT No results for input(s): PROT, ALBUMIN, AST, ALT, ALKPHOS, BILITOT, BILIDIR, IBILI in the last 72 hours. PT/INR No results for input(s): LABPROT, INR in the last 72 hours.  Studies/Results: Dg Chest 2 View  Result Date: 02/02/2018 CLINICAL DATA:  Cough and  shortness of breath and intermittent fever for 5 days. Shortness of breath is worsening. History of CHF, previous CVA, former smoker. EXAM: CHEST - 2 VIEW COMPARISON:  Chest x-ray of March 23, 2018 FINDINGS: The lungs are mildly hyperinflated. There is confluent airspace opacity in the left upper lobe a. There is patchy increased density in the right perihilar and suprahilar regions. The heart is top-normal in size. The pulmonary vascularity is not engorged. There is no pleural effusion. The bony thorax exhibits no acute abnormality. IMPRESSION: Left upper lobe pneumonia. Probable developing right upper lobe pneumonia. Followup PA and lateral chest X-ray is recommended in 3-4 weeks following trial of antibiotic therapy to ensure resolution and exclude underlying malignancy. Top-normal cardiac size without evidence of CHF. Thoracic aortic atherosclerosis. Electronically Signed   By: David  Martinique M.D.   On: 02/02/2018 13:37    Impression: Chronic anemia,symptomatic anemia, normocytic, FOBT positive stool, history of dark stools for over 2 months,normal BUN/creatinine ratio, iron saturation only 2%, ferritin 35, elevated TIBC of 372   Multiple comorbidities-currently being treated for pneumonia,atrial fibrillation and history of stroke  Plan: Continue clear liquid diet, if there is improvement in respiratory status plan endoscopy with propofol tomorrow in a.m.. If clearly Xarelto needs to be on hold for at least 3 days, if respiratory status has not improved by tomorrow morning, we will need to postpone the EGD for Saturday. Meanwhile continue  pantoprazole 40 mg twice a day.   LOS: 1 day   Ronnette Juniper, M.D.  02/03/2018, 9:54 AM  Pager [phone] If no answer or after 5 PM call [phone]

## 2018-02-03 NOTE — Progress Notes (Addendum)
Family Medicine Teaching Service Daily Progress Note Intern Pager: [phone]  Patient name: Megan Munoz Medical record number: [id number] Date of birth: October 04, 1932 Age: 84 y.o. Gender: female  Primary Care Provider: Fanny Bien, MD Consultants: GI Code Status: DNR  Chief Complaint: This of breath  Assessment and Plan: Megan Munoz is a 84 y.o. female presenting with shortness of breath x5 days. PMH is significant for A.fib on anticoagulation, hx of PNA, lung nodules, CVA, HTN, HLD, resting tremor, dementia, and melena.   Shortness of breath: Patient complaining of some shortness of breath x5 days as well as cough when lying down at night. CXR suggestive of left upper lobe pneumonia and probable right upper lobe pneumonia currently developing. Anemia contributing cto SOB. Lactic acid 2.14 > 0.9 (likely dehydration 2/2 poor PO as well as subacute bleeding).BNP 516 w/o other CXR findings. RR 22-35, satting 92% on 2-3L [place]. S/p 500cc bolus -Vitals per protocol -IV azithromycin and ceftriaxone -Continuous cardiac and pulse ox monitoring -Oxygen therapy to maintain O2 saturation greater than 92% -Up with assistance -Add DuoNeb due to wheezing, consider adding prednisone  HFpEF: last echo April 2017 shows EF 55-60% with mild inferolateral hypokinesis, mild dilatation of atria. BNP elevated to 516 this admission, but elevation may be multifactorial. Takes Lasix 20mg  daily.  - Consider repeat echo - Lasix 20mg  Daily  Atrial fibrillation: patient in A.fib on examination but rate controlled at 80bpm. She takes Diltiazem 120mg  TID, Carvedilol 12.5mg  BID, and Xarelto 20mg  daily with dinner. Previously on atenolol. - Continue diltiazem, d/c Carvedilol d/t wheezes/bronchospasm - Add Lopressor 50 - Hold Xarelto  Anemia: history of GI bleed, positive FOBT, hemoglobin of 7.2>9.6 s/p transfusion 2 U PRBCs.  Patient is on Xarelto. Lactic acid is also elevated at 2.14>0.9>2.0. Iron is decreased to  9 and TIBC is normal at 372.  Ferritin is normal at 35 and reticulocytes are responding appropriately at 3.8%. - Hold Xarelto - SCDs - Monitor vitals - GI consulted for 11/7 am, call if clinical status changes, otherwise anticipate scope 11/8; recommended protonix BID - Evening CBC 11/7  Hypertension: history of blood pressure, which was 128/60 on admission, 142/74 11/7. Patient is taking diltiazem 240 qDaily and carvedilol 12.5 BID. - continue meds as above  Hyperlipidemia: patient has a history of hyperlipidemia and is on simvastatin 40mg  daily.  - Continue statin - Consider repeat Lipid Panel  Dementia: patient with history of dementia and CVA. Family denies acute changes to patient's cognition and level of function. She is alert, oriented, and cooperative on exam. She takes memantine 10mg  BID, as well as Prozac 20 mg. -hold aricept and namenda  Depression: stable at present.  -Continue Prozac  GERD: patient with history of GERD.  No complaints on interview in emergency department. -Protonix  FEN/GI: Clear liquid diet, Colace Prophylaxis: SCDs  Disposition: Admit to med-surg  Subjective:  Patient seen this morning sitting upright in bed. She remains short of breath with worsening work of breathing. She denies cough this morning, as well as chest pain and shortness of breath. She states she just feels tired.   Objective: Temp:  [98.2 F (36.8 C)-99.8 F (37.7 C)] 98.2 F (36.8 C) (11/07 0344) Pulse Rate:  [30-138] 84 (11/07 0344) Resp:  [22-35] 25 (11/07 0344) BP: (111-150)/(51-106) 142/74 (11/07 0344) SpO2:  [84 %-100 %] 92 % (11/07 0344) Weight:  [59.9 kg] 59.9 kg (11/06 1245)  Physical Exam  Constitutional: She is oriented to person, place, and time. She appears well-developed. She  appears ill.  Frail, cachectic  HENT:  Head: Normocephalic.  Mouth/Throat: Oropharynx is clear and moist. No oropharyngeal exudate or posterior oropharyngeal edema.  Eyes: EOM  are normal.  Neck: Normal range of motion.  Cardiovascular: Normal rate, normal heart sounds and intact distal pulses.  No murmur heard. Irregular rhythm  Pulmonary/Chest:  Increased work of breathing, wheezes throughout, coarse breath sounds in bilateral upper lobes. Crackles in lower lobes bilaterally new on exam 11/7.  Abdominal: Soft. Bowel sounds are normal. She exhibits no distension. There is no tenderness.  Musculoskeletal:       Right lower leg: Normal.       Left lower leg: Normal.  Lymphadenopathy:    She has no cervical adenopathy.  Neurological: She is alert and oriented to person, place, and time.  Skin: Skin is warm and dry. Capillary refill takes less than 2 seconds.  Psychiatric: She has a normal mood and affect.   Laboratory: Recent Labs  Lab 02/02/18 1252 02/03/18 0355  WBC 8.9 8.7  HGB 7.2* 9.6*  HCT 24.3* 31.1*  PLT 241 208   Recent Labs  Lab 02/02/18 1252 02/03/18 0355  NA 136 140  K 3.7 3.7  CL 102 108  CO2 25 28  BUN 15 14  CREATININE 0.68 0.58  CALCIUM 8.8* 8.6*  GLUCOSE 206* 124*   Lactic acid: 2.14 FOBT: Positive Reticulocyte count: 3.8 elevated Ferritin: 35 normal Iron: 9 decreased BNP: 516.8 Troponin negative x1 Venous blood: Venous O2 29 decreased Vitamin B12: Elevated at 1939 Folate: 44.1 normal  Imaging/Diagnostic Tests: Dg Chest 2 View  Result Date: 02/02/2018 CLINICAL DATA:  Cough and shortness of breath and intermittent fever for 5 days. Shortness of breath is worsening. History of CHF, previous CVA, former smoker. EXAM: CHEST - 2 VIEW COMPARISON:  Chest x-ray of March 23, 2018 FINDINGS: The lungs are mildly hyperinflated. There is confluent airspace opacity in the left upper lobe a. There is patchy increased density in the right perihilar and suprahilar regions. The heart is top-normal in size. The pulmonary vascularity is not engorged. There is no pleural effusion. The bony thorax exhibits no acute abnormality. IMPRESSION:  Left upper lobe pneumonia. Probable developing right upper lobe pneumonia. Followup PA and lateral chest X-ray is recommended in 3-4 weeks following trial of antibiotic therapy to ensure resolution and exclude underlying malignancy. Top-normal cardiac size without evidence of CHF. Thoracic aortic atherosclerosis. Electronically Signed   By: David  Martinique M.D.   On: 02/02/2018 13:37    Daisy Floro, DO 02/03/2018, 7:41 AM PGY-1, [facility] Intern pager: [phone], text pages welcome

## 2018-02-03 NOTE — Consult Note (Addendum)
Cardiology Consultation:   Patient ID: Megan Munoz MRN: [id number]; DOB: 12-26-1932  Admit date: 02/02/2018 Date of Consult: 02/03/2018  Primary Care Provider: Fanny Bien, MD Primary Cardiologist: Dr. Ellyn Hack  Primary Electrophysiologist:  N/A  Patient Profile:   Megan Munoz is a 84 y.o. female with a hx of permanent atrial fibrillation managed with rate control therapy, h/o CVA, chronic anticoagulation w/ Xarelto, chronic diastolic HF, mild MR, coronary calcifications noted on CT, pulmonary nodules, HTN, HLD and prior h/o bladder cancer treated w/ surgery who is being seen today for the evaluation of dyspnea, in the setting of bilateral PNA, anemia from suspected GIB and CHF at the request of Dr. Ouida Sills, [facility].   History of Present Illness:   Megan Munoz is followed by Dr. Ellyn Hack and has a h/o permanent atrial fibrillation managed with rate control therapy, h/o CVA, chronic anticoagulation w/ Xarelto, chronic diastolic HF, coronary calcifications noted on CT, pulmonary nodules, HTN, HLD and prior h/o bladder cancer treated w/ surgery. CODE status is DNR. Her last echo in 2017 showed normal LVEF and mild MR.   She presented to the [facility] ED on 02/02/18 with CC of SOB. Symptoms present x 5 days. BNP was elevated at 516. Troponin negative. Her CXR showed normal cardiac size w/o evidence of CHF but did showed LUL PNA and probable developing RUL PNA. Repeat CXR today also showed increased bilateral upper lobe infiltrate c/w PNA. Initial labs also confirmed anemia w/ Hgb at 7.2 on admit (prior baseline 2 yrs ago was ~9-10). MCV 99 but no folate nor B12 deficiency. Fe low at 9. Ferritin 35.  FOBT +. Xarelto held. Pt was given blood transfusion. Hgb improved from 7.2>.9.6. GI has evaluated pt and will plan EGD tomorrow.   She has chronic afib, but rates have been controlled in the 80s. Primary team has discontinued Coreg however due to wheezing and dyspnea.    She denies CP. Her breathing  appears labored. She complains of fatigue.   Past Medical History:  Diagnosis Date  . Anxiety   . Atherosclerosis of aortic arch [facility]) April 2017   Noted on CT chest, calcified aortic atherosclerosis at the arch.  . Atrial fibrillation, permanent 03/2013   Previously on Atenolol for Rate - now on combination diltiazem and carvedilol for rate Control;  & Xarelto for AC. CHADS2VASC2 = 7.  . Cerebral infarction [facility]) 10/2008   MRI Brain 05/2014 for near syncope revealed: No acure infarct.  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   Mild small vessel disease type changes.  No intracranial hemorrhage.  Global atrophy.   . CHF (congestive heart failure) ([place])   . Coronary atherosclerosis due to calcified coronary lesion April 2017   Noted on CT chest  . Hyperlipemia   . Hypertension   . Tinea unguium   . Tremor     Past Surgical History:  Procedure Laterality Date  . BLADDER SURGERY    . CARPAL TUNNEL RELEASE Left   . FOOT SURGERY Right   . HERNIA REPAIR    . NASAL SINUS SURGERY    . TOTAL ABDOMINAL HYSTERECTOMY       Home Medications:  Prior to Admission medications   Medication Sig Start Date End Date Taking? Authorizing Provider  azelastine (ASTELIN) 0.1 % nasal spray Place 2 sprays into both nostrils 2 (two) times daily. Use in each nostril as directed   Yes [provider]  B Complex Vitamins (VITAMIN B COMPLEX PO) Take 1 tablet by mouth daily.    Yes [provider]  calcitonin, salmon, (MIACALCIN) 200 UNIT/ACT nasal spray Place 1 spray into alternate nostrils daily.   Yes [provider]  carvedilol (COREG) 12.5 MG tablet TAKE 1 TABLET TWICE A DAY WITH MEALS (DUE FOR FOLLOW UP APPOINTMENT IN [place]) Patient taking differently: Take 12.5 mg by mouth 2 (two) times daily with a meal.  12/01/17  Yes Leonie Man, MD  Cholecalciferol (VITAMIN D3 SUPER STRENGTH)  2000 units CAPS Take 2,000 Units by mouth daily.    Yes [provider]  diltiazem (CARDIZEM CD) 120 MG 24 hr capsule TAKE 3 CAPSULES DAILY (PLEASE SCHEDULE AN APPOINTMENT FOR FURTHER REFILLS) Patient taking differently: Take 240 mg by mouth daily.  12/20/17  Yes Leonie Man, MD  diphenhydrAMINE (SOMINEX) 25 MG tablet Take 50 mg by mouth at bedtime.    Yes [provider]  docusate sodium (COLACE) 100 MG capsule Take 200 mg by mouth 2 (two) times daily.   Yes [provider]  donepezil (ARICEPT) 10 MG tablet Take 1 tablet (10 mg total) by mouth at bedtime. 07/21/17  Yes Dennie Bible, NP  feeding supplement, ENSURE ENLIVE, (ENSURE ENLIVE) LIQD Take 237 mLs by mouth 3 (three) times daily between meals. 07/30/15  Yes Dhungel, Nishant, MD  ferrous sulfate 325 (65 FE) MG tablet Take 325 mg by mouth daily with breakfast.   Yes [provider]  FLUoxetine (PROZAC) 20 MG capsule Take 20 mg by mouth daily.   Yes [provider]  furosemide (LASIX) 20 MG tablet Take 1 tablet (20 mg total) by mouth daily as needed for fluid or edema. Patient taking differently: Take 20 mg by mouth every other day.  08/05/15  Yes Leonie Man, MD  loratadine (CLARITIN) 10 MG tablet Take 10 mg by mouth daily.   Yes [provider]  Melatonin 10 MG TABS Take 10 mg by mouth at bedtime.    Yes [provider]  memantine (NAMENDA) 10 MG tablet Take 1 tablet (10 mg total) by mouth 2 (two) times daily. 07/21/17  Yes Dennie Bible, NP  Multiple Vitamin (MULTIVITAMIN) capsule Take 1 capsule by mouth daily.   Yes [provider]  rivaroxaban (XARELTO) 20 MG TABS tablet Take 20 mg by mouth daily with supper.   Yes [provider]  simvastatin (ZOCOR) 40 MG tablet Take 40 mg by mouth daily at 6 PM.    Yes [provider]  carvedilol (COREG) 12.5 MG tablet TAKE 1 TABLET TWICE A DAY WITH MEALS (DUE FOR FOLLOW UP APPOINTMENT IN  [place]) Patient not taking: Reported on 02/02/2018 12/01/17   Leonie Man, MD    Inpatient Medications: Scheduled Meds: . atorvastatin  20 mg Oral q1800  . calcitonin (salmon)  1 spray Alternating Nares Daily  . diltiazem  240 mg Oral Daily  . docusate sodium  200 mg Oral BID  . feeding supplement  1 Container Oral TID BM  . FLUoxetine  20 mg Oral Daily  . furosemide  20 mg Oral Daily  . ipratropium-albuterol  3 mL Nebulization Q6H  . metoprolol tartrate  50 mg Oral BID  . pantoprazole  40 mg Oral BID   Continuous Infusions: . azithromycin Stopped (02/02/18 1736)  . cefTRIAXone (ROCEPHIN)  IV 2 g (02/03/18 1457)   PRN Meds:   Allergies:    Allergies  Allergen Reactions  .  Tetanus Toxoids Other (See Comments)    Told never to take     Social History:   Social History   Socioeconomic History  . Marital status: Widowed    Spouse name: Not on file  . Number of children: 1  . Years of education: 32  . Highest education level: Not on file  Occupational History  . Occupation: Retired  Scientific laboratory technician  . Financial resource strain: Not on file  . Food insecurity:    Worry: Not on file    Inability: Not on file  . Transportation needs:    Medical: Not on file    Non-medical: Not on file  Tobacco Use  . Smoking status: Former Smoker    Packs/day: 1.00    Years: 16.00    Pack years: 16.00    Start date: 03/30/1954    Last attempt to quit: 03/30/1970    Years since quitting: 47.8  . Smokeless tobacco: Never Used  . Tobacco comment: smoked 1/2-1 ppd  Substance and Sexual Activity  . Alcohol use: No    Alcohol/week: 0.0 standard drinks  . Drug use: No  . Sexual activity: Not on file  Lifestyle  . Physical activity:    Days per week: Not on file    Minutes per session: Not on file  . Stress: Not on file  Relationships  . Social connections:    Talks on phone: Not on file    Gets together: Not on file    Attends religious service: Not on file    Active member of  club or organization: Not on file    Attends meetings of clubs or organizations: Not on file    Relationship status: Not on file  . Intimate partner violence:    Fear of current or ex partner: Not on file    Emotionally abused: Not on file    Physically abused: Not on file    Forced sexual activity: Not on file  Other Topics Concern  . Not on file  Social History Narrative   Widow.  Lives with her daughter & her family.  (Son-in-law & 5 Grandsons).   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   2-4 cups caffeine/day   1-2 glassess of wine/beer  Per week.  Never smoked.      [facility]:   Originally from [place]. She lived in [place], [place], [place], & [place]. 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 No bird exposure. No mold or hot tub exposure.     Family History:    Family History  Problem Relation Age of Onset  . Osteoporosis Mother   . Emphysema Father   . Lung disease Father        Black Lung  . Stroke Brother   . Cancer Maternal Grandmother   . Emphysema Paternal Grandmother   . Asthma Daughter        allergy induced     ROS:  Please see the history of present illness.   All other ROS reviewed and negative.     Physical Exam/Data:   Vitals:   02/03/18 0215 02/03/18 0344 02/03/18 0846 02/03/18 1423  BP: (!) 150/75 (!) 142/74 130/70   Pulse: (!) 30 84 97   Resp: (!) 30 (!) 25 20   Temp:  98.2 F (36.8 C) 98.1 F (36.7 C)   TempSrc:  Oral    SpO2: 91% 92% 93% 98%  Weight:  Height:        Intake/Output Summary (Last 24 hours) at 02/03/2018 1503 Last data filed at 02/03/2018 0215 Gross per 24 hour  Intake 994.59 ml  Output 150 ml  Net 844.59 ml   Filed Weights   02/02/18 1245  Weight: 59.9 kg   Body mass index is 20.67 kg/m.  General:  Elderly WF, breathing labored. HEENT: normal Lymph: no adenopathy Neck: no JVD Endocrine:  No thryomegaly Vascular: No carotid bruits; FA pulses 2+ bilaterally  without bruits  Cardiac:  irregularly irregular rhythm, regular rate  Lungs:  Decreased BS bilaterally with diffuse expiratory wheezing Abd: soft, nontender, no hepatomegaly  Ext: no edema Musculoskeletal:  No deformities, BUE and BLE strength normal and equal Skin: warm and dry  Neuro:  CNs 2-12 intact, no focal abnormalities noted Psych:  Normal affect   EKG:  The EKG was personally reviewed and demonstrates:  afib 81 bpm Telemetry:  Telemetry was personally reviewed and demonstrates:  afib CVR 4 beat run of NSVT, and isolated PVCs  Relevant CV Studies: 2D echo 07/26/15  Study Conclusions  - Left ventricle: The cavity size was normal. Wall thickness was   normal. Systolic function was normal. The estimated ejection   fraction was in the range of 55% to 60%. Possible mild   hypokinesis of the basal-midinferolateral myocardium. - Mitral valve: Mild thickening, consistent with myxomatous   proliferation. Systolic bowing without prolapse. There was mild   regurgitation. - Left atrium: The atrium was mildly dilated. - Right atrium: The atrium was mildly dilated. - Pulmonary arteries: Systolic pressure was mildly increased. PA   peak pressure: 45 mm Hg (S). - Pericardium, extracardiac: There was a left pleural effusion.   2D Echo 02/02/18 - pending   Laboratory Data:  Chemistry Recent Labs  Lab 02/02/18 1252 02/03/18 0355  NA 136 140  K 3.7 3.7  CL 102 108  CO2 25 28  GLUCOSE 206* 124*  BUN 15 14  CREATININE 0.68 0.58  CALCIUM 8.8* 8.6*  GFRNONAA >60 >60  GFRAA >60 >60  ANIONGAP 9 4*    No results for input(s): PROT, ALBUMIN, AST, ALT, ALKPHOS, BILITOT in the last 168 hours. Hematology Recent Labs  Lab 02/02/18 1252 02/02/18 1405 02/03/18 0355  WBC 8.9  --  8.7  RBC 2.44* 2.22* 3.31*  HGB 7.2*  --  9.6*  HCT 24.3*  --  31.1*  MCV 99.6  --  94.0  MCH 29.5  --  29.0  MCHC 29.6*  --  30.9  RDW 13.2  --  14.5  PLT 241  --  208   Cardiac EnzymesNo results  for input(s): TROPONINI in the last 168 hours.  Recent Labs  Lab 02/02/18 1300  TROPIPOC 0.00    BNP Recent Labs  Lab 02/02/18 1253  BNP 516.8*    DDimer No results for input(s): DDIMER in the last 168 hours.  Radiology/Studies:  Dg Chest 2 View  Result Date: 02/03/2018 CLINICAL DATA:  Respiratory distress, pneumonia, shortness of breath, history CHF, hypertension, atrial fibrillation EXAM: CHEST - 2 VIEW COMPARISON:  02/02/2018 FINDINGS: Enlargement of cardiac silhouette. Atherosclerotic calcification aorta. BILATERAL upper lobe infiltrates consistent with pneumonia, increased since prior exam. Small bibasilar pleural effusions and atelectasis, greater on RIGHT, increased. No pneumothorax. Bones unremarkable. IMPRESSION: Increased BILATERAL upper lobe infiltrates consistent with pneumonia. Slight increase in small bibasilar pleural effusions and atelectasis. Electronically Signed   By: Lavonia Dana M.D.   On: 02/03/2018 13:48   Dg  Chest 2 View  Result Date: 02/02/2018 CLINICAL DATA:  Cough and shortness of breath and intermittent fever for 5 days. Shortness of breath is worsening. History of CHF, previous CVA, former smoker. EXAM: CHEST - 2 VIEW COMPARISON:  Chest x-ray of March 23, 2018 FINDINGS: The lungs are mildly hyperinflated. There is confluent airspace opacity in the left upper lobe a. There is patchy increased density in the right perihilar and suprahilar regions. The heart is top-normal in size. The pulmonary vascularity is not engorged. There is no pleural effusion. The bony thorax exhibits no acute abnormality. IMPRESSION: Left upper lobe pneumonia. Probable developing right upper lobe pneumonia. Followup PA and lateral chest X-ray is recommended in 3-4 weeks following trial of antibiotic therapy to ensure resolution and exclude underlying malignancy. Top-normal cardiac size without evidence of CHF. Thoracic aortic atherosclerosis. Electronically Signed   By: David  Martinique M.D.    On: 02/02/2018 13:37    Assessment and Plan:   Harlowe Dowler is a 84 y.o. female with a hx of permanent atrial fibrillation managed with rate control therapy, h/o CVA, chronic anticoagulation w/ Xarelto, chronic diastolic HF, mild MR, coronary calcifications noted on CT, pulmonary nodules, HTN, HLD and prior h/o bladder cancer treated w/ surgery who is being seen today for the evaluation of dyspnea, in the setting of bilateral PNA, anemia from suspected GIB and CHF at the request of Dr. Ouida Sills, [facility].   1. Dyspnea: likely multifactorial, 2/2 bilateral PNA, anemia/ GIB and mild CHF. She does have known diastolic dysfunction and BNP was mildly elevated a 500, but CXR showed no edema. She also has chronic afib, but her rates have been well controlled, thus doubt her afib is contributing to symptoms. Her breathing does appear a bit labored and diffuse wheezing is noted on exam. She does not appear grossly volume overloaded but her I/Os are +800cc and she did not get any IV Lasix post blood transfusion. I think her PNA is the primary contributor for her dyspnea but can try a dose of IV Lasix to see if any improvement. Her renal function, lytes and BP are ok. Will give 20 mg IV now. Monitor urinary response w/ Lasix. Continue strict I/Os and daily weights. Will get repeat BNP tomorrow morning. Continue further management of her PNA and anemia.   2. Chronic Afib: rate is controlled with Cardizem. Her coreg was discontinued due to dyspnea and wheezing. Xarelto is on hold due to anemia and suspected GIB. GI w/u pending and she is scheduled to get an EGD tomorrow, if respiratory status is improved. Decision to resume anticoagulation will be determined based on EGD findings and recs from gastroenterology.   3. Chronic Diastolic HF: see problem #1. Can try dose of IV Lasix to see if any improvement.  20 mg IV Lasix now. Repeat BNP tomorrow morning. Will also get repeat echo to reassess LVF.  4. Bilateral  PNA: antibiotics per primary team. She is on azithromycin and ceftriaxone.   5. Anemia/ Suspected GIB:  Hgb at 7.2 on admit (prior baseline 2 yrs ago was ~9-10). MCV 99 but no folate nor B12 deficiency. Fe low at 9. Ferritin 35.  FOBT +. Xarelto held. Pt was given blood transfusion. Hgb improved from 7.2>.9.6. GI has evaluated pt and will plan EGD tomorrow if respiratory status improves. On PPI therapy with BID Protonix.   6. HTN: 130/79 on last vitals check. Continue to monitor.   7. NSVT: 4 beat run noted on tele. Coreg has been  discontinued due to pulmonary status/ wheezing. Continue Cardizem. Will get 2D echo.   8. Mitral Regurgitation: last echo was in 2017. Mild MR was noted at the time. Given her dyspnea, will get repeat echo.     For questions or updates, please contact [place] Please consult [email or web address] for contact info under     Signed, Lyda Jester, PA-C  02/03/2018 3:03 PM  Patient examined chart reviewed Discussed care with daughter and PA.  Frail elderly female with pursed lip breathing Desaturation with shallow breathing Soft SEM abdomen soft no real edema. Dyspnea significantly from pneumonia and anemia requiring transfusion. Possible component of diastolic dysfunction Neck veins are elevated. Change laisx to iv daily will recheck TTE to make sure EF still normal and MR not worse. . Afib rate is well controlled Anticoagulation held due to symptomatic anemia and need for EGD. She appears tenuous from pulmonary standpoint She is DNR  Jenkins Rouge

## 2018-02-03 NOTE — Consult Note (Signed)
[facility] Consult  Referring Provider: Family Medicine Teaching service/Kehinde Eniola,MD Primary Care Physician:  Fanny Bien, MD Primary Gastroenterologist: Althia Forts  Reason for Consultation:  Anemia,FOBT positive stools  HPI: Megan Munoz is a 84 y.o. female was admitted yesterday with progressively worsening shortness of breath for 5 days along with low-grade fever of 100.7, weakness and findings of left upper lobe pneumonia on chest x-ray. We were consulted as patient presented with a hemoglobin of 7.2,likely contributing to exertional dyspnea, FOBT positive stool. Patient denies prior GI workup, no prior EGD or colonoscopy. For the last 2 months she reports dark green/black stools, several episodes on a daily basis but denies bright red blood per rectum. She denies unintentional weight loss but complains of decreased appetite. Denies difficulty in swallowing or pain on swallowing. Denies abdominal pain. She is on Xarelto for atrial fibrillation,last dose was yesterday evening. Denies use of NSAIDs or aspirin. She has been on ferrous sulfate several months for ongoing anemia, last hemoglobin in 2017 was 9.8. She has received 2 units of PRBC transfusion yesterday.  Past Medical History:  Diagnosis Date  . Anxiety   . Atherosclerosis of aortic arch [facility]) April 2017   Noted on CT chest, calcified aortic atherosclerosis at the arch.  . Atrial fibrillation, permanent 03/2013   Previously on Atenolol for Rate - now on combination diltiazem and carvedilol for rate Control;  & Xarelto for AC. CHADS2VASC2 = 7.  . Cerebral infarction [facility]) 10/2008   MRI Brain 05/2014 for near syncope revealed: No acure infarct.  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   Mild small vessel disease type changes.  No intracranial hemorrhage.  Global atrophy.   . CHF (congestive heart failure) ([place])   .  Coronary atherosclerosis due to calcified coronary lesion April 2017   Noted on CT chest  . Hyperlipemia   . Hypertension   . Tinea unguium   . Tremor     Past Surgical History:  Procedure Laterality Date  . BLADDER SURGERY    . CARPAL TUNNEL RELEASE Left   . FOOT SURGERY Right   . HERNIA REPAIR    . NASAL SINUS SURGERY    . TOTAL ABDOMINAL HYSTERECTOMY      Prior to Admission medications   Medication Sig Start Date End Date Taking? Authorizing Provider  azelastine (ASTELIN) 0.1 % nasal spray Place 2 sprays into both nostrils 2 (two) times daily. Use in each nostril as directed   Yes [provider]  B Complex Vitamins (VITAMIN B COMPLEX PO) Take 1 tablet by mouth daily.    Yes [provider]  calcitonin, salmon, (MIACALCIN) 200 UNIT/ACT nasal spray Place 1 spray into alternate nostrils daily.   Yes [provider]  carvedilol (COREG) 12.5 MG tablet TAKE 1 TABLET TWICE A DAY WITH MEALS (DUE FOR FOLLOW UP APPOINTMENT IN [place]) Patient taking differently: Take 12.5 mg by mouth 2 (two) times daily with a meal.  12/01/17  Yes Leonie Man, MD  Cholecalciferol (VITAMIN D3 SUPER STRENGTH) 2000 units CAPS Take 2,000 Units by mouth daily.    Yes [provider]  diltiazem (CARDIZEM CD) 120 MG 24 hr capsule TAKE 3 CAPSULES DAILY (PLEASE SCHEDULE AN APPOINTMENT FOR FURTHER REFILLS) Patient taking differently: Take 240 mg by mouth daily.  12/20/17  Yes Leonie Man, MD  diphenhydrAMINE (SOMINEX) 25 MG tablet Take 50 mg by mouth at bedtime.  Yes [provider]  docusate sodium (COLACE) 100 MG capsule Take 200 mg by mouth 2 (two) times daily.   Yes [provider]  donepezil (ARICEPT) 10 MG tablet Take 1 tablet (10 mg total) by mouth at bedtime. 07/21/17  Yes Dennie Bible, NP  feeding supplement, ENSURE ENLIVE, (ENSURE ENLIVE) LIQD Take 237 mLs by mouth 3 (three) times daily between meals. 07/30/15  Yes Dhungel, Nishant, MD   ferrous sulfate 325 (65 FE) MG tablet Take 325 mg by mouth daily with breakfast.   Yes [provider]  FLUoxetine (PROZAC) 20 MG capsule Take 20 mg by mouth daily.   Yes [provider]  furosemide (LASIX) 20 MG tablet Take 1 tablet (20 mg total) by mouth daily as needed for fluid or edema. Patient taking differently: Take 20 mg by mouth every other day.  08/05/15  Yes Leonie Man, MD  loratadine (CLARITIN) 10 MG tablet Take 10 mg by mouth daily.   Yes [provider]  Melatonin 10 MG TABS Take 10 mg by mouth at bedtime.    Yes [provider]  memantine (NAMENDA) 10 MG tablet Take 1 tablet (10 mg total) by mouth 2 (two) times daily. 07/21/17  Yes Dennie Bible, NP  Multiple Vitamin (MULTIVITAMIN) capsule Take 1 capsule by mouth daily.   Yes [provider]  rivaroxaban (XARELTO) 20 MG TABS tablet Take 20 mg by mouth daily with supper.   Yes [provider]  simvastatin (ZOCOR) 40 MG tablet Take 40 mg by mouth daily at 6 PM.    Yes [provider]  carvedilol (COREG) 12.5 MG tablet TAKE 1 TABLET TWICE A DAY WITH MEALS (DUE FOR FOLLOW UP APPOINTMENT IN [place]) Patient not taking: Reported on 02/02/2018 12/01/17   Leonie Man, MD    Current Facility-Administered Medications  Medication Dose Route Frequency Provider Last Rate Last Dose  . atorvastatin (LIPITOR) tablet 20 mg  20 mg Oral q1800 Long, Wonda Olds, MD      . azithromycin (ZITHROMAX) 500 mg in sodium chloride 0.9 % 250 mL IVPB  500 mg Intravenous Q24H Sela Hilding, MD   Stopped at 02/02/18 1736  . calcitonin (salmon) (MIACALCIN/FORTICAL) nasal spray 1 spray  1 spray Alternating Nares Daily Sela Hilding, MD   1 spray at 02/03/18 [phone]  . carvedilol (COREG) tablet 12.5 mg  12.5 mg Oral BID WC Sela Hilding, MD   12.5 mg at 02/03/18 0837  . cefTRIAXone (ROCEPHIN) 2 g in sodium chloride 0.9 % 100 mL IVPB  2 g Intravenous Q24H Sela Hilding, MD    Stopped at 02/02/18 1523  . diltiazem (CARDIZEM CD) 24 hr capsule 240 mg  240 mg Oral Daily Sela Hilding, MD   240 mg at 02/03/18 0837  . docusate sodium (COLACE) capsule 200 mg  200 mg Oral BID Sela Hilding, MD   200 mg at 02/03/18 0837  . feeding supplement (BOOST / RESOURCE BREEZE) liquid 1 Container  1 Container Oral TID BM Sela Hilding, MD   1 Container at 02/03/18 [phone]  . FLUoxetine (PROZAC) capsule 20 mg  20 mg Oral Daily Sela Hilding, MD   20 mg at 02/03/18 0837  . pantoprazole (PROTONIX) EC tablet 40 mg  40 mg Oral BID Sela Hilding, MD   40 mg at 02/03/18 2563    Allergies as of 02/02/2018 - Review Complete 02/02/2018  Allergen Reaction Noted  . Tetanus toxoids Other (See Comments) 05/25/2014    Family History  Problem Relation Age of Onset  . Osteoporosis Mother   . Emphysema Father   . Lung disease Father        Black Lung  . Stroke Brother   . Cancer Maternal Grandmother   . Emphysema Paternal Grandmother   . Asthma Daughter        allergy induced    Social History   Socioeconomic History  . Marital status: Widowed    Spouse name: Not on file  . Number of children: 1  . Years of education: 21  . Highest education level: Not on file  Occupational History  . Occupation: Retired  Scientific laboratory technician  . Financial resource strain: Not on file  . Food insecurity:    Worry: Not on file    Inability: Not on file  . Transportation needs:    Medical: Not on file    Non-medical: Not on file  Tobacco Use  . Smoking status: Former Smoker    Packs/day: 1.00    Years: 16.00    Pack years: 16.00    Start date: 03/30/1954    Last attempt to quit: 03/30/1970    Years since quitting: 47.8  . Smokeless tobacco: Never Used  . Tobacco comment: smoked 1/2-1 ppd  Substance and Sexual Activity  . Alcohol use: No    Alcohol/week: 0.0 standard drinks  . Drug use: No  . Sexual activity: Not on file  Lifestyle  . Physical activity:    Days per week:  Not on file    Minutes per session: Not on file  . Stress: Not on file  Relationships  . Social connections:    Talks on phone: Not on file    Gets together: Not on file    Attends religious service: Not on file    Active member of club or organization: Not on file    Attends meetings of clubs or organizations: Not on file    Relationship status: Not on file  . Intimate partner violence:    Fear of current or ex partner: Not on file    Emotionally abused: Not on file    Physically abused: Not on file    Forced sexual activity: Not on file  Other Topics Concern  . Not on file  Social History Narrative   Widow.  Lives with her daughter & her family.  (Son-in-law & 5 Grandsons).   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   2-4 cups caffeine/day   1-2 glassess of wine/beer  Per week.  Never smoked.      [facility]:   Originally from [place]. She lived in [place], [place] & [place]. 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 No bird exposure. No mold or hot tub exposure.     Review of Systems: Positive for: GI: Described in detail in HPI.    Gen: anorexia, fatigue, weakness, malaise, Denies any fever, chills, rigors, night sweats, involuntary weight loss, and sleep disorder CV: Denies chest pain, angina, palpitations, syncope, orthopnea, PND, peripheral edema, and claudication. Resp: dyspnea, wheezing, Denies cough, sputum, coughing up blood. GU : Denies urinary burning, blood in urine, urinary frequency, urinary hesitancy, nocturnal urination, and urinary incontinence. MS: Denies joint pain or swelling.  Denies muscle weakness, cramps, atrophy.  Derm: Denies rash, itching, oral ulcerations, hives, unhealing ulcers.  Psych: Denies depression, anxiety, memory loss, suicidal ideation, hallucinations,  and confusion. Heme: Denies bruising and enlarged lymph nodes. Neuro:  Denies any  headaches, dizziness, paresthesias. Endo:  Denies  any problems with DM, thyroid, adrenal function.  Physical Exam: Vital signs in last 24 hours: Temp:  [98.1 F (36.7 C)-99.8 F (37.7 C)] 98.1 F (36.7 C) (11/07 0846) Pulse Rate:  [30-138] 97 (11/07 0846) Resp:  [20-35] 20 (11/07 0846) BP: (111-150)/(51-106) 130/70 (11/07 0846) SpO2:  [84 %-100 %] 93 % (11/07 0846) Weight:  [59.9 kg] 59.9 kg (11/06 1245) Last BM Date: 02/01/18  General:   Elderly, thinly built, Frail,not able to speak in full sentences, on oxygen via nasal cannula at 3 L/m, using accessory muscles of respiration Head:  Normocephalic and atraumatic. Eyes:  Sclera clear, no icterus.   Obvious pallor. Ears:  Normal auditory acuity. Nose:  No deformity, discharge,  or lesions. Mouth:  No deformity or lesions.  Oropharynx pink & moist. Neck:  Supple; no masses or thyromegaly. Lungs:  Decreased breath sound in the left upper lung field along with wheezing, normal breath sounds in right lung field Heart:  Irregular rate and rhythm Extremities:  Mild bipedal pitting edema Neurologic:  Alert and  oriented x4;  grossly normal neurologically. Skin:  Intact without significant lesions or rashes. Psych:  Alert and cooperative. Normal mood and affect. Abdomen:  Soft, nontender and nondistended. No masses, hepatosplenomegaly or hernias noted. Normal bowel sounds, without guarding, and without rebound.         Lab Results: Recent Labs    02/02/18 1252 02/03/18 0355  WBC 8.9 8.7  HGB 7.2* 9.6*  HCT 24.3* 31.1*  PLT 241 208   BMET Recent Labs    02/02/18 1252 02/03/18 0355  NA 136 140  K 3.7 3.7  CL 102 108  CO2 25 28  GLUCOSE 206* 124*  BUN 15 14  CREATININE 0.68 0.58  CALCIUM 8.8* 8.6*   LFT No results for input(s): PROT, ALBUMIN, AST, ALT, ALKPHOS, BILITOT, BILIDIR, IBILI in the last 72 hours. PT/INR No results for input(s): LABPROT, INR in the last 72 hours.  Studies/Results: Dg Chest 2 View  Result Date: 02/02/2018 CLINICAL DATA:  Cough and  shortness of breath and intermittent fever for 5 days. Shortness of breath is worsening. History of CHF, previous CVA, former smoker. EXAM: CHEST - 2 VIEW COMPARISON:  Chest x-ray of March 23, 2018 FINDINGS: The lungs are mildly hyperinflated. There is confluent airspace opacity in the left upper lobe a. There is patchy increased density in the right perihilar and suprahilar regions. The heart is top-normal in size. The pulmonary vascularity is not engorged. There is no pleural effusion. The bony thorax exhibits no acute abnormality. IMPRESSION: Left upper lobe pneumonia. Probable developing right upper lobe pneumonia. Followup PA and lateral chest X-ray is recommended in 3-4 weeks following trial of antibiotic therapy to ensure resolution and exclude underlying malignancy. Top-normal cardiac size without evidence of CHF. Thoracic aortic atherosclerosis. Electronically Signed   By: David  Martinique M.D.   On: 02/02/2018 13:37    Impression: Chronic anemia,symptomatic anemia, normocytic, FOBT positive stool, history of dark stools for over 2 months,normal BUN/creatinine ratio, iron saturation only 2%, ferritin 35, elevated TIBC of 372   Multiple comorbidities-currently being treated for pneumonia,atrial fibrillation and history of stroke  Plan: Continue clear liquid diet, if there is improvement in respiratory status plan endoscopy with propofol tomorrow in a.m.. If clearly Xarelto needs to be on hold for at least 3 days, if respiratory status has not improved by tomorrow morning, we will need to postpone the EGD for Saturday. Meanwhile continue  pantoprazole 40 mg twice a day.   LOS: 1 day   Ronnette Juniper, M.D.  02/03/2018, 9:54 AM  Pager [phone] If no answer or after 5 PM call [phone]

## 2018-02-04 ENCOUNTER — Encounter (HOSPITAL_COMMUNITY)
Admission: EM | Disposition: A | Discharge status: Home Health | Payer: Self-pay | Source: Home / Self Care | Attending: Family Medicine

## 2018-02-04 ENCOUNTER — Encounter (HOSPITAL_COMMUNITY): Payer: Self-pay | Admitting: Certified Registered Nurse Anesthetist

## 2018-02-04 ENCOUNTER — Inpatient Hospital Stay (HOSPITAL_COMMUNITY): Payer: Medicare Other | Admitting: Certified Registered Nurse Anesthetist

## 2018-02-04 ENCOUNTER — Inpatient Hospital Stay (HOSPITAL_COMMUNITY): Payer: Medicare Other

## 2018-02-04 DIAGNOSIS — I361 Nonrheumatic tricuspid (valve) insufficiency: Secondary | ICD-10-CM

## 2018-02-04 DIAGNOSIS — I340 Nonrheumatic mitral (valve) insufficiency: Secondary | ICD-10-CM

## 2018-02-04 DIAGNOSIS — I5041 Acute combined systolic (congestive) and diastolic (congestive) heart failure: Secondary | ICD-10-CM

## 2018-02-04 HISTORY — PX: ESOPHAGOGASTRODUODENOSCOPY (EGD) WITH PROPOFOL: SHX5813

## 2018-02-04 LAB — ECHOCARDIOGRAM COMPLETE
Height: 67 in
WEIGHTICAEL: 2123.47 [oz_av]

## 2018-02-04 LAB — HEMOGLOBIN AND HEMATOCRIT, BLOOD
HCT: 31.7 % — ABNORMAL LOW (ref 36.0–46.0)
HEMOGLOBIN: 9.8 g/dL — AB (ref 12.0–15.0)

## 2018-02-04 LAB — BRAIN NATRIURETIC PEPTIDE: B NATRIURETIC PEPTIDE 5: 301.6 pg/mL — AB (ref 0.0–100.0)

## 2018-02-04 SURGERY — ESOPHAGOGASTRODUODENOSCOPY (EGD) WITH PROPOFOL
Anesthesia: Monitor Anesthesia Care

## 2018-02-04 MED ORDER — LIDOCAINE 2% (20 MG/ML) 5 ML SYRINGE
INTRAMUSCULAR | Status: DC | PRN
  Administered 2018-02-04: 60 mg via INTRAVENOUS

## 2018-02-04 MED ORDER — SODIUM CHLORIDE 0.9 % IV SOLN: INTRAVENOUS | Status: DC

## 2018-02-04 MED ORDER — PROPOFOL 10 MG/ML IV BOLUS
INTRAVENOUS | Status: DC | PRN
  Administered 2018-02-04: 20 mg via INTRAVENOUS
  Administered 2018-02-04: 10 mg via INTRAVENOUS

## 2018-02-04 MED ORDER — PROPOFOL 500 MG/50ML IV EMUL
INTRAVENOUS | Status: DC | PRN
  Administered 2018-02-04: 100 ug/kg/min via INTRAVENOUS

## 2018-02-04 MED ORDER — ALBUTEROL SULFATE (2.5 MG/3ML) 0.083% IN NEBU
2.5000 mg | INHALATION_SOLUTION | RESPIRATORY_TRACT | Status: DC | PRN
  Administered 2018-02-06: 2.5 mg via RESPIRATORY_TRACT
  Filled 2018-02-04: qty 3

## 2018-02-04 MED ORDER — AZITHROMYCIN 250 MG PO TABS
250.0000 mg | ORAL_TABLET | Freq: Every day | ORAL | Status: AC
  Administered 2018-02-04 – 2018-02-06 (×3): 250 mg via ORAL
  Filled 2018-02-04 (×3): qty 1

## 2018-02-04 MED ORDER — LACTATED RINGERS IV SOLN
INTRAVENOUS | Status: DC | PRN
  Administered 2018-02-04: 12:00:00 via INTRAVENOUS

## 2018-02-04 MED ORDER — CEFDINIR 300 MG PO CAPS
300.0000 mg | ORAL_CAPSULE | Freq: Two times a day (BID) | ORAL | Status: AC
  Administered 2018-02-04 – 2018-02-06 (×5): 300 mg via ORAL
  Filled 2018-02-04 (×5): qty 1

## 2018-02-04 MED ORDER — ONDANSETRON HCL 4 MG/2ML IJ SOLN
INTRAMUSCULAR | Status: DC | PRN
  Administered 2018-02-04: 4 mg via INTRAVENOUS

## 2018-02-04 MED ORDER — IPRATROPIUM-ALBUTEROL 0.5-2.5 (3) MG/3ML IN SOLN
3.0000 mL | Freq: Three times a day (TID) | RESPIRATORY_TRACT | Status: DC
  Administered 2018-02-05 (×2): 3 mL via RESPIRATORY_TRACT
  Filled 2018-02-04 (×3): qty 3

## 2018-02-04 MED ORDER — POLYETHYLENE GLYCOL 3350 17 G PO PACK
17.0000 g | PACK | Freq: Two times a day (BID) | ORAL | Status: DC
  Administered 2018-02-04 – 2018-02-05 (×4): 17 g via ORAL
  Filled 2018-02-04 (×3): qty 1

## 2018-02-04 SURGICAL SUPPLY — 15 items

## 2018-02-04 NOTE — Transfer of Care (Signed)
Immediate Anesthesia Transfer of Care Note  Patient: Megan Munoz  Procedure(s) Performed: ESOPHAGOGASTRODUODENOSCOPY (EGD) WITH PROPOFOL (N/A )  Patient Location: Endoscopy Unit  Anesthesia Type:MAC  Level of Consciousness: drowsy and responds to stimulation  Airway & Oxygen Therapy: Patient Spontanous Breathing and Patient connected to nasal cannula oxygen  Post-op Assessment: Report given to RN and Post -op Vital signs reviewed and stable  Post vital signs: Reviewed and stable  Last Vitals:  Vitals Value Taken Time  BP    Temp    Pulse    Resp    SpO2      Last Pain:  Vitals:   02/04/18 1019  TempSrc: Oral  PainSc: 0-No pain         Complications: No apparent anesthesia complications

## 2018-02-04 NOTE — Progress Notes (Signed)
Pt code status did not match RN handoff report and Epic order.  Pt and family member asked for clarification if patient was to be full code or DNR.  Patient and daughter (who is HCPOA) both agreed that patient wishes to be a full code at this time. RN spoke with Dr. Maudie Mercury (night intern with [employer]) by phone and relayed family request.

## 2018-02-04 NOTE — Progress Notes (Signed)
Progress Note  Patient Name: Maribella Kuna Date of Encounter: 02/04/2018  Primary Cardiologist: Ellyn Hack  Subjective   Less dyspnea   Inpatient Medications    Scheduled Meds: . atorvastatin  20 mg Oral q1800  . calcitonin (salmon)  1 spray Alternating Nares Daily  . diltiazem  240 mg Oral Daily  . docusate sodium  200 mg Oral BID  . feeding supplement  1 Container Oral TID BM  . FLUoxetine  20 mg Oral Daily  . furosemide  20 mg Intravenous Daily  . ipratropium-albuterol  3 mL Nebulization Q6H  . metoprolol tartrate  50 mg Oral BID  . pantoprazole  40 mg Oral BID  . predniSONE  40 mg Oral QAC breakfast   Continuous Infusions: . azithromycin 500 mg (02/03/18 1545)  . cefTRIAXone (ROCEPHIN)  IV 2 g (02/03/18 1457)   PRN Meds:    Vital Signs    Vitals:   02/04/18 0406 02/04/18 0600 02/04/18 0804 02/04/18 0836  BP:    122/65  Pulse: 84   79  Resp: 19     Temp: 98.2 F (36.8 C)   98.2 F (36.8 C)  TempSrc: Oral   Oral  SpO2: 94%  96% 98%  Weight:  60.2 kg    Height:        Intake/Output Summary (Last 24 hours) at 02/04/2018 0853 Last data filed at 02/04/2018 3016 Gross per 24 hour  Intake 1270 ml  Output 1600 ml  Net -330 ml   Filed Weights   02/02/18 1245 02/04/18 0600  Weight: 59.9 kg 60.2 kg    Telemetry    afib rate controlled  - Personally Reviewed  ECG    afib rate 81 nonspecific ST changes  - Personally Reviewed  Physical Exam  Frail elderly white female  GEN: No acute distress.   Neck: No JVD Cardiac: RRR, no murmurs, rubs, or gallops.  Respiratory: diffuse rhonchi  GI: Soft, nontender, non-distended  MS: No edema; No deformity. Neuro:  Nonfocal  Psych: Normal affect   Labs    Chemistry Recent Labs  Lab 02/02/18 1252 02/03/18 0355  NA 136 140  K 3.7 3.7  CL 102 108  CO2 25 28  GLUCOSE 206* 124*  BUN 15 14  CREATININE 0.68 0.58  CALCIUM 8.8* 8.6*  GFRNONAA >60 >60  GFRAA >60 >60  ANIONGAP 9 4*     Hematology Recent  Labs  Lab 02/02/18 1252 02/02/18 1405 02/03/18 0355 02/03/18 1610 02/04/18 0241  WBC 8.9  --  8.7 8.2  --   RBC 2.44* 2.22* 3.31* 3.23*  --   HGB 7.2*  --  9.6* 9.5* 9.8*  HCT 24.3*  --  31.1* 30.6* 31.7*  MCV 99.6  --  94.0 94.7  --   MCH 29.5  --  29.0 29.4  --   MCHC 29.6*  --  30.9 31.0  --   RDW 13.2  --  14.5 14.9  --   PLT 241  --  208 212  --     Cardiac EnzymesNo results for input(s): TROPONINI in the last 168 hours.  Recent Labs  Lab 02/02/18 1300  TROPIPOC 0.00     BNP Recent Labs  Lab 02/02/18 1253 02/04/18 0241  BNP 516.8* 301.6*     DDimer No results for input(s): DDIMER in the last 168 hours.   Radiology    Dg Chest 2 View  Result Date: 02/03/2018 CLINICAL DATA:  Respiratory distress, pneumonia, shortness of breath, history  CHF, hypertension, atrial fibrillation EXAM: CHEST - 2 VIEW COMPARISON:  02/02/2018 FINDINGS: Enlargement of cardiac silhouette. Atherosclerotic calcification aorta. BILATERAL upper lobe infiltrates consistent with pneumonia, increased since prior exam. Small bibasilar pleural effusions and atelectasis, greater on RIGHT, increased. No pneumothorax. Bones unremarkable. IMPRESSION: Increased BILATERAL upper lobe infiltrates consistent with pneumonia. Slight increase in small bibasilar pleural effusions and atelectasis. Electronically Signed   By: Lavonia Dana M.D.   On: 02/03/2018 13:48   Dg Chest 2 View  Result Date: 02/02/2018 CLINICAL DATA:  Cough and shortness of breath and intermittent fever for 5 days. Shortness of breath is worsening. History of CHF, previous CVA, former smoker. EXAM: CHEST - 2 VIEW COMPARISON:  Chest x-ray of March 23, 2018 FINDINGS: The lungs are mildly hyperinflated. There is confluent airspace opacity in the left upper lobe a. There is patchy increased density in the right perihilar and suprahilar regions. The heart is top-normal in size. The pulmonary vascularity is not engorged. There is no pleural effusion.  The bony thorax exhibits no acute abnormality. IMPRESSION: Left upper lobe pneumonia. Probable developing right upper lobe pneumonia. Followup PA and lateral chest X-ray is recommended in 3-4 weeks following trial of antibiotic therapy to ensure resolution and exclude underlying malignancy. Top-normal cardiac size without evidence of CHF. Thoracic aortic atherosclerosis. Electronically Signed   By: David  Martinique M.D.   On: 02/02/2018 13:37    Cardiac Studies   TTE pending   Patient Profile     Aftan Vint is a 84 y.o. female with a hx of permanent atrial fibrillation managed with rate control therapy, h/o CVA, chronic anticoagulation w/ Xarelto, chronic diastolic HF, mild MR, coronary calcifications noted on CT, pulmonary nodules, HTN, HLD and prior h/o bladder cancer treated w/ surgery who is being seen today for the evaluation of dyspnea, in the setting of bilateral PNA, anemia from suspected GIB and CHF at the request of Dr. Ouida Sills, [facility].   Assessment & Plan    1. Dyspnea: primarily related to pneumonia and anemia. Mild Diastolic dysfunction possible with elevated BNP continue iv daily lasix   2. Chronic Afib: rate is controlled  Off anticoagulation due to anemia and need for EGD this am   3. Bilateral PNA: antibiotics per primary team. She is on azithromycin and ceftriaxone. Oxygenating well should be Ok for EGD this am   5. Anemia/ Suspected GIB:  Hgb at 7.2 on admit (prior baseline 2 yrs ago was ~9-10). MCV 99 but no folate nor B12 deficiency. Fe low at 9. Ferritin 35.  FOBT +. Xarelto held. Pt was given blood transfusion. Hgb improved from 7.2>.9.6. GI has evaluated pt and will plan EGD today if respiratory status improves. On PPI therapy with BID Protonix.   6. HTN: 130/79 on last vitals check. Continue to monitor.   7. NSVT: 4 beat run noted on tele. Coreg has been discontinued due to pulmonary status/ wheezing. Continue Cardizem. Will get 2D echo.   8. Mitral  Regurgitation: last echo was in 2017. Mild MR was noted at the time. Given her dyspnea, will get repeat echo.      For questions or updates, please contact [place] Please consult [email or web address] for contact info under        Signed, Jenkins Rouge, MD  02/04/2018, 8:53 AM

## 2018-02-04 NOTE — Progress Notes (Signed)
  Echocardiogram 2D Echocardiogram was attempted but patient was in Endoscopy.   Jennette Dubin 02/04/2018, 1:02 PM

## 2018-02-04 NOTE — Op Note (Signed)
EGD showed normal esophagus, irregular Z line at 40 cm from incisors. It seems that the patient had swallowed a strand of paper tissue which is noted in the esophagus but easily pushed into the gastric cavity. Erythematous mucosa noted in the antrum.  The cardia and fundus were normal on retroflexion.  Normal duodenal bulb and rest of the duodenum.  Rectal exam was performed at the conclusion of the procedure. Hard, small, pellet-like dark brown stools were noted, no obvious mass noted on digital rectal exam, no evidence of recent or active bleeding.  Recommendations: Regular diet. Okay to resume Xarelto from GI standpoint. Patient not in a clinical condition to undergo prep for colonoscopy. Recommend CAT scan of the abdomen and pelvis with by mouth and IV contrast to rule out colonic pathology.  Ronnette Juniper, M.D.

## 2018-02-04 NOTE — Brief Op Note (Signed)
02/02/2018 - 02/04/2018  12:27 PM  PATIENT:  Megan Munoz  84 y.o. female  PRE-OPERATIVE DIAGNOSIS:  anemia,melena  POST-OPERATIVE DIAGNOSIS:  mild gastritis tissue paper   PROCEDURE:  Procedure(s): ESOPHAGOGASTRODUODENOSCOPY (EGD) WITH PROPOFOL (N/A)  SURGEON:  Surgeon(s) and Role:    Ronnette Juniper, MD - Primary  PHYSICIAN ASSISTANT:   ASSISTANTS:Patricia Ford, RN, Janie Billups, Tech  ANESTHESIA:   MAC  EBL:  0 mL   BLOOD ADMINISTERED:none  DRAINS: none   LOCAL MEDICATIONS USED:  NONE  SPECIMEN:  No Specimen  DISPOSITION OF SPECIMEN:  N/A  COUNTS:  YES  TOURNIQUET:  * No tourniquets in log *  DICTATION: .Dragon Dictation  PLAN OF CARE: Admit to inpatient   PATIENT DISPOSITION:  PACU - hemodynamically stable.   Delay start of Pharmacological VTE agent (>24hrs) due to surgical blood loss or risk of bleeding: no

## 2018-02-04 NOTE — Anesthesia Postprocedure Evaluation (Signed)
Anesthesia Post Note  Patient: Megan Munoz  Procedure(s) Performed: ESOPHAGOGASTRODUODENOSCOPY (EGD) WITH PROPOFOL (N/A )     Patient location during evaluation: PACU Anesthesia Type: MAC Level of consciousness: awake and alert Pain management: pain level controlled Vital Signs Assessment: post-procedure vital signs reviewed and stable Respiratory status: spontaneous breathing, nonlabored ventilation, respiratory function stable and patient connected to nasal cannula oxygen Cardiovascular status: stable and blood pressure returned to baseline Postop Assessment: no apparent nausea or vomiting Anesthetic complications: no    Last Vitals:  Vitals:   02/04/18 1019 02/04/18 1225  BP: (!) 138/41 123/60  Pulse: 85 81  Resp: (!) 22 (!) 22  Temp: 36.9 C 36.6 C  SpO2: 93% 97%    Last Pain:  Vitals:   02/04/18 1225  TempSrc: Oral  PainSc: 0-No pain                 Cailah Reach

## 2018-02-04 NOTE — Anesthesia Preprocedure Evaluation (Signed)
Anesthesia Evaluation  Patient identified by MRN, date of birth, ID band Patient awake    Reviewed: Allergy & Precautions, H&P , NPO status , Patient's Chart, lab work & pertinent test results, reviewed documented beta blocker date and time   Airway Mallampati: II  TM Distance: >3 FB Neck ROM: full    Dental no notable dental hx.    Pulmonary neg pulmonary ROS, former smoker,    Pulmonary exam normal breath sounds clear to auscultation       Cardiovascular Exercise Tolerance: Good hypertension, negative cardio ROS   Rhythm:regular Rate:Normal     Neuro/Psych negative neurological ROS  negative psych ROS   GI/Hepatic negative GI ROS, Neg liver ROS,   Endo/Other  negative endocrine ROS  Renal/GU negative Renal ROS  negative genitourinary   Musculoskeletal   Abdominal   Peds  Hematology negative hematology ROS (+) Blood dyscrasia, anemia ,   Anesthesia Other Findings   Reproductive/Obstetrics negative OB ROS                             Anesthesia Physical Anesthesia Plan  ASA: III  Anesthesia Plan: MAC   Post-op Pain Management:    Induction: Intravenous  PONV Risk Score and Plan:   Airway Management Planned: Mask and Natural Airway  Additional Equipment:   Intra-op Plan:   Post-operative Plan:   Informed Consent: I have reviewed the patients History and Physical, chart, labs and discussed the procedure including the risks, benefits and alternatives for the proposed anesthesia with the patient or authorized representative who has indicated his/her understanding and acceptance.   Dental Advisory Given  Plan Discussed with: Anesthesiologist, Surgeon and CRNA  Anesthesia Plan Comments:         Anesthesia Quick Evaluation

## 2018-02-04 NOTE — Interval H&P Note (Signed)
History and Physical Interval Note: 85/female with anemia, dark stools, FOBT positive, needs xarelto for A fib(last dose on 02/01/18 ) for an EGD today.  02/04/2018 11:03 AM  Megan Munoz  has presented today for EGD, with the diagnosis of anemia,melena  The various methods of treatment have been discussed with the patient and family. After consideration of risks, benefits and other options for treatment, the patient has consented to  Procedure(s): ESOPHAGOGASTRODUODENOSCOPY (EGD) WITH PROPOFOL (N/A) as a surgical intervention .  The patient's history has been reviewed, patient examined, no change in status, stable for surgery.  I have reviewed the patient's chart and labs.  Questions were answered to the patient's satisfaction.     Ronnette Juniper

## 2018-02-04 NOTE — Consult Note (Signed)
            [facility] CM Primary Care Navigator  02/04/2018  Megan Munoz 03-06-1933 [id number]   Went to see patient at the bedside to identify possible discharge needs butstaff reports that she is off the unit in Endo for a procedure at this time(EGD- Esophagogastroduodenoscopy).  Will attempt tofollow-up andsee patientforfurther [facility]-CM needs when available in the room.     Addendum (02/07/18):   Went back to see patient at the bedside to identify possible discharge needs and her daughter Megan Munoz) came in during this visit.  Daughter reports that patient was having weakness, easily getting tired, coughing and labored breathing that had led to this admission. (bilateral upper lobe pneumonia with superimposed COPD exacerbation, iron deficiency anemia, HF exacerbation)  PatientendorsesDr.Elizabeth Dewey with [name], MD office as the primary care provider.   Patientisusing[facility], [facility]  and [facility] [address] [facility] without difficulty.   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  Patient's daughter has been providing transportationto herdoctors'appointments.  Patientstates that she has lives with her daughter (stay at home mom) and family. Daughter is the primary caregiver for her at home.   Anticipated plan for discharge is homewith home health services per therapy recommendation.  Patient and daughtervoicedunderstanding to call primary care provider's office after discharge for a post discharge follow-up appointment within 1- 2 weeksor sooner if needs arise. Patient letter(with PCP's contact number) was provided asa reminder.  Explained to patient and daughter regarding[facility] CM services available for health managementand resources at home. Daughter reports that patient's health issues  are well maintained and managed with her assistance, however, would verbally agree and opt forEMMI Pneumonia calls tofollow-up with patient'srecovery at home.  Referralmade forEMMI Pneumonia Calls after discharge.  Patient's daughterverbalizedunderstanding to seekreferral to [facility] care management from primary care provider ifdeemed necessary andappropriateforfurtherservices in thenear future.  [facility] care management information provided for future needs thatpatient may have.  RN notified of daughter's discharge concerns at this time.   For additional questions please contact:  Edwena Felty A. Noelene Gang, BSN, RN-BC [facility] PRIMARY CARE Navigator Cell: [phone]

## 2018-02-04 NOTE — Progress Notes (Signed)
Patient maintaining O2 sats 89-93% on room air at rest, while awake. Noted that she drops 88% and below when she dozes off. When awakened, she again regains her previous baseline. Daughter Maudie Mercury is questioning whether she needs nighttime O2 or CPAP. Would like to know if she can be tested for apnea. Will mention to provider on progression rounds.

## 2018-02-04 NOTE — Progress Notes (Signed)
  Echocardiogram 2D Echocardiogram has been performed.  Jennette Dubin 02/04/2018, 3:14 PM

## 2018-02-04 NOTE — Anesthesia Procedure Notes (Signed)
Procedure Name: MAC Date/Time: 02/04/2018 12:12 PM Performed by: White, Amedeo Plenty, CRNA Pre-anesthesia Checklist: Emergency Drugs available, Patient identified, Suction available and Patient being monitored Patient Re-evaluated:Patient Re-evaluated prior to induction Oxygen Delivery Method: Nasal cannula

## 2018-02-04 NOTE — Op Note (Signed)
[facility] Patient Name: Megan Munoz Procedure Date : 02/04/2018 MRN: [id number] Attending MD: Ronnette Juniper , MD Date of Birth: [birth year] CSN: [id number] Age: 84 Admit Type: Inpatient Procedure:                Upper GI endoscopy Indications:              Unexplained iron deficiency anemia, dark stools Providers:                Ronnette Juniper, MD, Carmie End, RN, Cherylynn Ridges,                            Technician, Wyatt Haste Referring MD:              Medicines:                Monitored Anesthesia Care Complications:            No immediate complications. Estimated blood loss:                            None. Estimated Blood Loss:     Estimated blood loss: none. Procedure:                Pre-Anesthesia Assessment:                           - Prior to the procedure, a History and Physical                            was performed, and patient medications and                            allergies were reviewed. The patient's tolerance of                            previous anesthesia was also reviewed. The risks                            and benefits of the procedure and the sedation                            options and risks were discussed with the patient.                            All questions were answered, and informed consent                            was obtained. Prior Anticoagulants: The patient has                            taken Xarelto (rivaroxaban), last dose was 3 days                            prior to procedure. ASA Grade Assessment: III - A  patient with severe systemic disease. After                            reviewing the risks and benefits, the patient was                            deemed in satisfactory condition to undergo the                            procedure.                           After obtaining informed consent, the endoscope was                            passed under direct vision. Throughout the                             procedure, the patient's blood pressure, pulse, and                            oxygen saturations were monitored continuously. The                            GIF-H190 ([id number]) Olympus adult EGD was introduced                            through the mouth, and advanced to the second part                            of duodenum. The upper GI endoscopy was                            accomplished without difficulty. The patient                            tolerated the procedure well. Scope In: Scope Out: Findings:      The examined esophagus was normal.      The Z-line was regular and was found 40 cm from the incisors.      A long strand of paper tissue was noted in the esophagus and was pushed       into the gastric cavity.      Localized mildly erythematous mucosa without bleeding was found in the       gastric antrum.      The cardia and gastric fundus were normal on retroflexion.      The examined duodenum was normal. Impression:               - Normal esophagus. Paper tissue noted in                            esophagus, pushed in to the gastric cavity.                           - Z-line regular, 40 cm from  the incisors.                           - Erythematous mucosa in the antrum.                           - Normal examined duodenum.                           - No specimens collected. Moderate Sedation:      Patient did not receive moderate sedation for this procedure, but       instead received monitored anesthesia care. Recommendation:           - Resume regular diet.                           - Rectal exam showed hard,pellet like dark brown                            stools, no mass felt on DRE and no obvious bleeding                            noted.                           - Use Protonix (pantoprazole) 40 mg PO daily.                           - Resume Xarelto (rivaroxaban) at prior dose today.                            Refer to referring  physician for further adjustment                            of therapy.                           - Patient is not in a state to drink the prep or                            undergo colonoscopy. Recommend CT of the abdomen                            and pelvis to evaluate the colon. Procedure Code(s):        --- Professional ---                           [phone], Esophagogastroduodenoscopy, flexible,                            transoral; diagnostic, including collection of                            specimen(s) by brushing or washing, when performed                            (  separate procedure) Diagnosis Code(s):        --- Professional ---                           K31.89, Other diseases of stomach and duodenum                           D50.9, Iron deficiency anemia, unspecified CPT copyright 2018 American Medical Association. All rights reserved. The codes documented in this report are preliminary and upon coder review may  be revised to meet current compliance requirements. Ronnette Juniper, MD 02/04/2018 12:27:04 PM This report has been signed electronically. Number of Addenda: 0

## 2018-02-04 NOTE — Care Management Important Message (Signed)
Important Message  Patient Details  Name: Megan Munoz MRN: [id number] Date of Birth: April 25, 1932   Medicare Important Message Given:  Yes    [name] 02/04/2018, 3:30 PM

## 2018-02-04 NOTE — Progress Notes (Addendum)
Family Medicine Teaching Service Daily Progress Note Intern Pager: [phone]  Patient name: Megan Munoz Medical record number: [id number] Date of birth: 03/03/1933 Age: 84 y.o. Gender: female  Primary Care Provider: Fanny Bien, MD Consultants:GI Code Status:DNR  Chief Complaint:Shortness of breath  Assessment and Plan: Megan Munoz a 84 y.o.femalepresenting with shortness of breath x5 days. PMH is significant forA.fib on anticoagulation, hx of PNA, lung nodules, CVA, HTN, HLD, resting tremor, dementia, and melena.   Shortnessof breath, improving: due to Upper Lobe pneumonia with superimposed CHF exacerbation. Since starting lasix per cards SOB has improved and O2 sats have increased with less O2 supplementation. CXR suggestive of left upper lobe pneumonia and probable right upper lobe pneumonia currently developing. -Vitals per protocol -IV azithromycin and ceftriaxone -Continuous cardiac and pulse ox monitoring -Oxygen therapy to maintain O2 saturation greater than 92% -Up with assistance -Add DuoNeb due to wheezing, consider adding prednisone - Potentially d/c home 11/8  HFpEF: last echo April 2017 shows EF 55-60% with mild inferolateral hypokinesis, mild dilatation of atria.BNP elevated to 516 > 301 11/7, but elevation may be multifactorial. Takes Lasix 20mg  daily at home. - Repeat echo ordered for 11/8 - Lasix 20mg  Daily IV  Atrial fibrillation:patient in A.fib on examination but rate controlled at 80bpm. She takes Diltiazem 120mg  TID, Carvedilol 12.5mg  BID, and Xarelto 20mg  daily with dinner. Previously on atenolol. - Continuediltiazem, d/c Carvedilol d/t wheezes/bronchospasm - Add Lopressor 50 - Hold Xarelto  Anemia:history of GI bleed, positive FOBT, hemoglobin of 7.2>9.6>9.8 11/8 s/p transfusion 2 U PRBCs. Patient is on Xarelto at home. Lactic acid is also elevated at 2.14>0.9>2.0. Iron is decreased to 9 and TIBC is normal at 372.  Ferritin is normal  at 35 and reticulocytes are responding appropriately at 3.8%. - Hold Xarelto - SCDs - Monitor vitals - GI consulted, call if clinical status changes, otherwise anticipate scope 11/8; recommended protonix BID - Will wait to start treatment for iron deficiency anemia when outpatient  Hypertension, improved, stable:history of blood pressure, which was 128/60 on admission, 117/61 11/8. Notably decreased since administering lasix. Patient is taking diltiazem 240 qDaily and carvedilol 12.5 BID. - continue meds as above  Hyperlipidemia:patient has a history of hyperlipidemia and is on simvastatin 40mg  daily.  - Continue statin  Dementia:patient with history of dementia and CVA. Family denies acute changes to patient's cognition and level of function. She is alert, oriented, and cooperative on exam. She takes memantine 10mg  BID,as well as Prozac 20 mg. -hold aricept and namenda  Depression: stable at present. -Continue Prozac  GERD:patient with history of GERD. No complaints on interview in emergency department. -Protonix  FEN/GI:Clear liquid diet, Colace Prophylaxis:SCDs  Disposition:Discharge home on PO antibiotics pending medical clearance  Subjective:  Patient is seen this morning sitting upright in bed, she is breathing easier and reports feeling much better.  She is satting 98% on 3 L nasal cannula.  She denies cough, chest pain, nausea, vomiting, and leg swelling.  She reports she has been well on Lasix.  Objective: Temp:  [98.1 F (36.7 C)-98.6 F (37 C)] 98.2 F (36.8 C) (11/08 0406) Pulse Rate:  [79-103] 84 (11/08 0406) Resp:  [19-22] 19 (11/08 0406) BP: (117-130)/(55-83) 117/61 (11/07 2355) SpO2:  [93 %-99 %] 94 % (11/08 0406) Weight:  [60.2 kg] 60.2 kg (11/08 0600)  Physical Exam  Constitutional: She is oriented to person, place, and time. She appears well-developed. She appears ill.  Frail, cachectic  HENT:  Head: Normocephalic.  Mouth/Throat:  Oropharynx  is clear and moist. No oropharyngeal exudate or posterior oropharyngeal edema.  Eyes: EOM are normal.  Neck: Normal range of motion.  Cardiovascular: Normal rate, normal heart sounds and intact distal pulses.  No murmur heard. Irregular rhythm  Pulmonary/Chest:  Improved work of breathing, wheezes throughout, coarse breath sounds in bilateral upper lobes are improving. Crackles improved.  Abdominal: Soft. Bowel sounds are normal. She exhibits no distension. There is no tenderness.  Musculoskeletal:       Right lower leg: Normal.       Left lower leg: Normal.  Lymphadenopathy:    She has no cervical adenopathy.  Neurological: She is alert and oriented to person, place, and time.  Skin: Skin is warm and dry. Capillary refill takes less than 2 seconds.  Psychiatric: She has a normal mood and affect.   Laboratory: Recent Labs  Lab 02/02/18 1252 02/03/18 0355 02/03/18 1610 02/04/18 0241  WBC 8.9 8.7 8.2  --   HGB 7.2* 9.6* 9.5* 9.8*  HCT 24.3* 31.1* 30.6* 31.7*  PLT 241 208 212  --    Recent Labs  Lab 02/02/18 1252 02/03/18 0355  NA 136 140  K 3.7 3.7  CL 102 108  CO2 25 28  BUN 15 14  CREATININE 0.68 0.58  CALCIUM 8.8* 8.6*  GLUCOSE 206* 124*   Lactic acid: 2.14 FOBT: Positive Reticulocyte count: 3.8 elevated Ferritin: 35 normal Iron: 9 decreased BNP: 516.8 Troponin negative x1 Venous blood: Venous O2 29 decreased Vitamin B12: Elevated at 1939 Folate: 44.1 normal  Imaging/Diagnostic Tests: Dg Chest 2 View  Result Date: 02/03/2018 CLINICAL DATA:  Respiratory distress, pneumonia, shortness of breath, history CHF, hypertension, atrial fibrillation EXAM: CHEST - 2 VIEW COMPARISON:  02/02/2018 FINDINGS: Enlargement of cardiac silhouette. Atherosclerotic calcification aorta. BILATERAL upper lobe infiltrates consistent with pneumonia, increased since prior exam. Small bibasilar pleural effusions and atelectasis, greater on RIGHT, increased. No pneumothorax.  Bones unremarkable. IMPRESSION: Increased BILATERAL upper lobe infiltrates consistent with pneumonia. Slight increase in small bibasilar pleural effusions and atelectasis. Electronically Signed   By: Lavonia Dana M.D.   On: 02/03/2018 13:48   Dg Chest 2 View  Result Date: 02/02/2018 CLINICAL DATA:  Cough and shortness of breath and intermittent fever for 5 days. Shortness of breath is worsening. History of CHF, previous CVA, former smoker. EXAM: CHEST - 2 VIEW COMPARISON:  Chest x-ray of March 23, 2018 FINDINGS: The lungs are mildly hyperinflated. There is confluent airspace opacity in the left upper lobe a. There is patchy increased density in the right perihilar and suprahilar regions. The heart is top-normal in size. The pulmonary vascularity is not engorged. There is no pleural effusion. The bony thorax exhibits no acute abnormality. IMPRESSION: Left upper lobe pneumonia. Probable developing right upper lobe pneumonia. Followup PA and lateral chest X-ray is recommended in 3-4 weeks following trial of antibiotic therapy to ensure resolution and exclude underlying malignancy. Top-normal cardiac size without evidence of CHF. Thoracic aortic atherosclerosis. Electronically Signed   By: David  Martinique M.D.   On: 02/02/2018 13:37     Daisy Floro, DO 02/04/2018, 7:28 AM PGY-1, [place] Intern pager: [phone], text pages welcome

## 2018-02-05 ENCOUNTER — Inpatient Hospital Stay (HOSPITAL_COMMUNITY): Payer: Medicare Other

## 2018-02-05 ENCOUNTER — Encounter (HOSPITAL_COMMUNITY): Payer: Self-pay | Admitting: Radiology

## 2018-02-05 DIAGNOSIS — R0603 Acute respiratory distress: Secondary | ICD-10-CM

## 2018-02-05 DIAGNOSIS — K2971 Gastritis, unspecified, with bleeding: Secondary | ICD-10-CM

## 2018-02-05 LAB — CBC
HEMATOCRIT: 30.1 % — AB (ref 36.0–46.0)
Hemoglobin: 9.3 g/dL — ABNORMAL LOW (ref 12.0–15.0)
MCH: 28.9 pg (ref 26.0–34.0)
MCHC: 30.9 g/dL (ref 30.0–36.0)
MCV: 93.5 fL (ref 80.0–100.0)
Platelets: 228 10*3/uL (ref 150–400)
RBC: 3.22 MIL/uL — ABNORMAL LOW (ref 3.87–5.11)
RDW: 13.9 % (ref 11.5–15.5)
WBC: 8.3 10*3/uL (ref 4.0–10.5)
nRBC: 0.4 % — ABNORMAL HIGH (ref 0.0–0.2)

## 2018-02-05 LAB — BASIC METABOLIC PANEL
Anion gap: 6 (ref 5–15)
BUN: 20 mg/dL (ref 8–23)
CO2: 30 mmol/L (ref 22–32)
Calcium: 8 mg/dL — ABNORMAL LOW (ref 8.9–10.3)
Chloride: 101 mmol/L (ref 98–111)
Creatinine, Ser: 0.61 mg/dL (ref 0.44–1.00)
GFR calc Af Amer: >60 mL/min (ref >60)
GFR calc non Af Amer: >60 mL/min (ref >60)
GLUCOSE: 116 mg/dL — AB (ref 70–99)
Potassium: 3.1 mmol/L — ABNORMAL LOW (ref 3.5–5.1)
Sodium: 137 mmol/L (ref 135–145)

## 2018-02-05 MED ORDER — POTASSIUM CHLORIDE 20 MEQ PO PACK
40.0000 meq | PACK | Freq: Two times a day (BID) | ORAL | Status: DC
  Administered 2018-02-05 (×2): 40 meq via ORAL
  Filled 2018-02-05 (×3): qty 2

## 2018-02-05 MED ORDER — ENSURE ENLIVE PO LIQD
237.0000 mL | Freq: Two times a day (BID) | ORAL | Status: DC
  Administered 2018-02-05: 237 mL via ORAL

## 2018-02-05 MED ORDER — POTASSIUM CHLORIDE CRYS ER 20 MEQ PO TBCR: 40.0000 meq | EXTENDED_RELEASE_TABLET | Freq: Two times a day (BID) | ORAL | Status: DC

## 2018-02-05 MED ORDER — IOHEXOL 300 MG/ML  SOLN
100.0000 mL | Freq: Once | INTRAMUSCULAR | Status: AC | PRN
  Administered 2018-02-05: 100 mL via INTRAVENOUS

## 2018-02-05 MED ORDER — ENSURE ENLIVE PO LIQD
237.0000 mL | Freq: Two times a day (BID) | ORAL | Status: DC
  Administered 2018-02-06 – 2018-02-07 (×3): 237 mL via ORAL

## 2018-02-05 MED ORDER — ADULT MULTIVITAMIN W/MINERALS CH
1.0000 | ORAL_TABLET | Freq: Every day | ORAL | Status: DC
  Administered 2018-02-05 – 2018-02-07 (×3): 1 via ORAL
  Filled 2018-02-05 (×3): qty 1

## 2018-02-05 NOTE — Progress Notes (Addendum)
Family Medicine Teaching Service Daily Progress Note Intern Pager: [phone]  Patient name: Megan Munoz Medical record number: [id number] Date of birth: 1932/12/26 Age: 84 y.o. Gender: female  Primary Care Provider: Fanny Bien, MD Consultants:GI Code Status:DNR  Chief Complaint:Shortness of breath  Assessment and Plan: Megan Munoz a 84 y.o.femalepresenting with shortness of breath x5 days. PMH is significant forA.fib on anticoagulation, hx of PNA, lung nodules, CVA, HTN, HLD, resting tremor, dementia, and melena.   Shortnessof breath, improving: due to Upper Lobe pneumonia with superimposed CHF exacerbation. Since starting lasix per cards SOB has improved and O2 sats have increased with less O2 supplementation. CXRsuggestive of left upper lobe pneumonia and probable right upper lobe pneumonia currently developing. -Vitals per protocol -PO azithromycin and Cefdinir -Continuous cardiac and pulse ox monitoring -Oxygen therapy as needed to maintain O2 saturation greater than 92% -Up with assistance -Add DuoNeb due to wheezing, consider adding prednisone -Potentially d/c home 11/9 -PT/OT  HFpEF: last echo April 2017 shows EF 55-60% with mild inferolateral hypokinesis, mild dilatation of atria.Echo from 11/8 showed EF 50-55% with diffuse hypokinesis and atrial dilatation. BNP elevated to 516 > 301 11/7, but elevation may be multifactorial. Takes Lasix 20mg  daily at home. - Continue Lasix 20mg  Daily IV  Atrial fibrillation:patient in A.fib on examination but rate controlled at 80bpm. She takes Diltiazem 120mg  TID, Carvedilol 12.5mg  BID, and Xarelto 20mg  daily with dinner.Previously on atenolol. EGD 11/8 showed EF 50-55% w/ diffuse hypokinesis and atrial dilatation.  - Continuediltiazem, d/c Carvedilol d/t wheezes/bronchospasm - Add Lopressor 50 - Hold Xarelto  Anemia:history of GI bleed,positive FOBT, hemoglobin of 7.2>9.6>9.8 > 9.3 11/9 s/p transfusion 2 U  PRBCs. Patient is on Xarelto at home. Lactic acid is also elevated at 2.14>0.9>2.0.Iron is decreasedto 9 and TIBC is normal at 372. Ferritin is normal at 35 and reticulocytes are responding appropriately at 3.8%. EGD from 11/8 found paper tissue in patient's esophagus and erythema of gastric antrum. Patient had too much stool burden and was not a candidate for colonoscopy at that time, so CT abdomen/pelvis was performed. CT abdomen showed no primary bowel pathology explaining iron deficiency anemia. CT also showed bilateral pleural effusions R > L with, an old L2 fracture, and single lytic lesion to S3.  - Hold Xarelto - SCDs - Monitor vitals - GI consulted -  Recommended protonix BID - Will wait to start treatment for iron deficiency anemia when outpatient d/t current infection  Hypokalemia: patient's K was 3.1 on 11/9. - 40 mEq Kdur BID - Monitor  Lytic Lesion of Sacrum: CT abdomen/pelvis showed single lytic lesion to S3. This is not presumed to be malignant as it is an isolated finding. Patient's Ca++ is low at 8.0 and renal function is normal. Patient is however anemic (believed to be due to GI source).  - SPEP 11/9  Hypertension, improved, stable:history of blood pressure, which was 128/60 on admission, 116/43 11/9. Notably decreased since administering lasix. Patient is taking diltiazem 240 qDailyand carvedilol12.5 BID. - continue diltiazem and Lopressor  Hyperlipidemia:patient has a history of hyperlipidemia and is on simvastatin 40mg  daily.  - Continue statin  Dementia:patient with history of dementiaand CVA. Family denies acute changes to patient's cognition and level of function. She is alert, oriented, and cooperative on exam. She takes memantine 10mg  BID,as well as Prozac 20 mg. -hold aricept and namenda  Depression: stable at present. -Continue Prozac  GERD:patient with history of GERD. No complaints on interview in emergency  department. -Protonix  FEN/GI:Clear liquid diet,  Colace Prophylaxis:SCDs  Disposition:Discharge home on PO antibiotics and Protonix  Subjective:  The patient was seen this morning sitting up right in bed sleeping. She awoke and remained sleepy, stated she was feeling and breathing better. She states she is peeing a lot. She was informed of the results of her EGD, echo and abdominal/pelvic CT. She knows about the paper in her esophagus, the slightly reduced ejection fraction with hypokinesis, and the pleural effusions, old L2 fracture, and finding of the lytic lesion in her sacrum. The reports from these tests were printed and given to her to keep. She has no other concerns.   Objective: Temp:  [97.9 F (36.6 C)-99.8 F (37.7 C)] 98.5 F (36.9 C) (11/09 0823) Pulse Rate:  [65-85] 83 (11/09 0823) Resp:  [20-22] 20 (11/09 0015) BP: (116-127)/(41-69) 127/69 (11/09 0823) SpO2:  [94 %-97 %] 96 % (11/09 0823)  Physical Exam  Constitutional: She appears ill.  Frail, cachectic  Cardiovascular: Normal rate, normal heart sounds and intact distal pulses.  No murmur heard. Irregular rhythm, stable  Pulmonary/Chest:  Wheezes throughout, coarse breath sounds in bilateral upper lobes are improving. Crackles improved - unchanged from previous exam  Abdominal: Soft. Bowel sounds are normal.  Musculoskeletal:  1+ pitting edema bilaterally   Laboratory: Recent Labs  Lab 02/03/18 0355 02/03/18 1610 02/04/18 0241 02/05/18 0630  WBC 8.7 8.2  --  8.3  HGB 9.6* 9.5* 9.8* 9.3*  HCT 31.1* 30.6* 31.7* 30.1*  PLT 208 212  --  228   Recent Labs  Lab 02/02/18 1252 02/03/18 0355 02/05/18 0630  NA 136 140 137  K 3.7 3.7 3.1*  CL 102 108 101  CO2 25 28 30   BUN 15 14 20   CREATININE 0.68 0.58 0.61  CALCIUM 8.8* 8.6* 8.0*  GLUCOSE 206* 124* 116*   Imaging/Diagnostic Tests: Dg Chest 2 View  Result Date: 02/03/2018 CLINICAL DATA:  Respiratory distress, pneumonia, shortness of breath,  history CHF, hypertension, atrial fibrillation EXAM: CHEST - 2 VIEW COMPARISON:  02/02/2018 FINDINGS: Enlargement of cardiac silhouette. Atherosclerotic calcification aorta. BILATERAL upper lobe infiltrates consistent with pneumonia, increased since prior exam. Small bibasilar pleural effusions and atelectasis, greater on RIGHT, increased. No pneumothorax. Bones unremarkable. IMPRESSION: Increased BILATERAL upper lobe infiltrates consistent with pneumonia. Slight increase in small bibasilar pleural effusions and atelectasis. Electronically Signed   By: Lavonia Dana M.D.   On: 02/03/2018 13:48   Dg Chest 2 View  Result Date: 02/02/2018 CLINICAL DATA:  Cough and shortness of breath and intermittent fever for 5 days. Shortness of breath is worsening. History of CHF, previous CVA, former smoker. EXAM: CHEST - 2 VIEW COMPARISON:  Chest x-ray of March 23, 2018 FINDINGS: The lungs are mildly hyperinflated. There is confluent airspace opacity in the left upper lobe a. There is patchy increased density in the right perihilar and suprahilar regions. The heart is top-normal in size. The pulmonary vascularity is not engorged. There is no pleural effusion. The bony thorax exhibits no acute abnormality. IMPRESSION: Left upper lobe pneumonia. Probable developing right upper lobe pneumonia. Followup PA and lateral chest X-ray is recommended in 3-4 weeks following trial of antibiotic therapy to ensure resolution and exclude underlying malignancy. Top-normal cardiac size without evidence of CHF. Thoracic aortic atherosclerosis. Electronically Signed   By: David  Martinique M.D.   On: 02/02/2018 13:37   Ct Abdomen Pelvis W Contrast  Result Date: 02/05/2018 CLINICAL DATA:  Iron deficiency anemia EXAM: CT ABDOMEN AND PELVIS WITH CONTRAST TECHNIQUE: Multidetector CT imaging of the  abdomen and pelvis was performed using the standard protocol following bolus administration of intravenous contrast. CONTRAST:  116mL OMNIPAQUE IOHEXOL  300 MG/ML  SOLN COMPARISON:  CT chest 05/15/2016 FINDINGS: Lower chest: Bilateral pleural effusions layering dependently, larger on the right than the left, with dependent atelectasis. No significant parenchymal finding in the aerated lung. The heart is mildly enlarged. Hepatobiliary: 4 mm cyst at the caudal tip of the right lobe of the liver. No other liver parenchymal lesion is seen. There is mild periportal edema. This is nonspecific finding. No calcified gallstones are seen. No sign of gallbladder inflammation. Pancreas: Normal Spleen: Normal Adrenals/Urinary Tract: Adrenal glands are normal. Kidneys are normal. The right kidney is incompletely rotated in superficial due to the patient's small size. No intrinsic pathology. The bladder appears normal. Stomach/Bowel: No bowel pathology is evident by CT. Evidence of ileus, obstruction or mass lesion. No evidence of enteritis. No visible diverticulosis. Vascular/Lymphatic: Aortic atherosclerosis. No aneurysm. IVC is normal. No retroperitoneal adenopathy. Reproductive: Previous hysterectomy.  No pelvic mass. Other: No free fluid or air. Musculoskeletal: Old appearing compression fracture at L2 with loss of height anteriorly of 50%. Advanced lower lumbar degenerative disc disease and degenerative facet disease with 1 cm of anterolisthesis at L4-5 and 5 mm at L5-S1. There are degenerative cyst within the endplates at P2-9. There is a single 1 cm relatively lytic focus within the S3 segment with absence of the dorsal cortex. As an isolated finding, this is of doubtful significance. If the patient did turn out to have some malignancy, this could represent a metastasis, but as an isolated finding is more likely benign. I cannot see any definite correlate on a lumbar MRI from August of 2017. 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 IMPRESSION: Bilateral pleural effusions layering dependently larger on the right than the left  with dependent atelectasis. Aortic atherosclerosis. No primary bowel pathology identified to explain iron deficiency anemia. Old fracture at L2. Advanced degenerative changes in the lower lumbar spine. See above discussion for focal finding of the S3 segment of the sacrum, which as an isolated finding is favored to be benign. Electronically Signed   By: Nelson Chimes M.D.   On: 02/05/2018 06:44    Daisy Floro, DO 02/05/2018, 11:10 AM PGY-1, [facility] Intern pager: [phone], text pages welcome

## 2018-02-05 NOTE — Progress Notes (Signed)
Subjective: Patient seen and examined at bedside. Refuses to take breakfast, continues to be short of breath.  Objective: Vital signs in last 24 hours: Temp:  [98.5 F (36.9 C)-99.8 F (37.7 C)] 98.5 F (36.9 C) (11/09 0823) Pulse Rate:  [65-85] 83 (11/09 0823) Resp:  [20] 20 (11/09 0015) BP: (116-127)/(41-69) 127/69 (11/09 0823) SpO2:  [94 %-96 %] 96 % (11/09 0823) Weight change:  Last BM Date: 02/04/18  PE:. Thinly built,appears sick, frail appearing, in mild respiratory distress GENERAL:mild pallor ABDOMEN:nondistended EXTREMITIES:no deformity  Lab Results: Results for orders placed or performed during the hospital encounter of 02/02/18 (from the past 48 hour(s))  Lactic acid, plasma     Status: Abnormal   Collection Time: 02/03/18  1:06 PM  Result Value Ref Range   Lactic Acid, Venous 2.0 (HH) 0.5 - 1.9 mmol/L    Comment: CRITICAL RESULT CALLED TO, READ BACK BY AND VERIFIED WITH: B SIMPSON RN 1440 02/03/2018 BY A BENNETT Performed at [facility], [place] [address], [place], [place] [address]   CBC     Status: Abnormal   Collection Time: 02/03/18  4:10 PM  Result Value Ref Range   WBC 8.2 4.0 - 10.5 K/uL   RBC 3.23 (L) 3.87 - 5.11 MIL/uL   Hemoglobin 9.5 (L) 12.0 - 15.0 g/dL   HCT 30.6 (L) 36.0 - 46.0 %   MCV 94.7 80.0 - 100.0 fL   MCH 29.4 26.0 - 34.0 pg   MCHC 31.0 30.0 - 36.0 g/dL   RDW 14.9 11.5 - 15.5 %   Platelets 212 150 - 400 K/uL   nRBC 0.4 (H) 0.0 - 0.2 %    Comment: Performed at [facility] [address], [place], [place] [address]  Brain natriuretic peptide     Status: Abnormal   Collection Time: 02/04/18  2:41 AM  Result Value Ref Range   B Natriuretic Peptide 301.6 (H) 0.0 - 100.0 pg/mL    Comment: Performed at [facility] [address], [place], [place] [address]  Hemoglobin and hematocrit, blood     Status: Abnormal   Collection Time: 02/04/18  2:41 AM  Result Value Ref Range   Hemoglobin 9.8 (L) 12.0 - 15.0 g/dL   HCT  31.7 (L) 36.0 - 46.0 %    Comment: Performed at [facility], [place] [address], [place], [place] [address]  CBC     Status: Abnormal   Collection Time: 02/05/18  6:30 AM  Result Value Ref Range   WBC 8.3 4.0 - 10.5 K/uL   RBC 3.22 (L) 3.87 - 5.11 MIL/uL   Hemoglobin 9.3 (L) 12.0 - 15.0 g/dL   HCT 30.1 (L) 36.0 - 46.0 %   MCV 93.5 80.0 - 100.0 fL   MCH 28.9 26.0 - 34.0 pg   MCHC 30.9 30.0 - 36.0 g/dL   RDW 13.9 11.5 - 15.5 %   Platelets 228 150 - 400 K/uL   nRBC 0.4 (H) 0.0 - 0.2 %    Comment: Performed at [facility], [place] [address], [place], [place] [address]  Basic metabolic panel     Status: Abnormal   Collection Time: 02/05/18  6:30 AM  Result Value Ref Range   Sodium 137 135 - 145 mmol/L   Potassium 3.1 (L) 3.5 - 5.1 mmol/L   Chloride 101 98 - 111 mmol/L   CO2 30 22 - 32 mmol/L   Glucose, Bld 116 (H) 70 - 99  mg/dL   BUN 20 8 - 23 mg/dL   Creatinine, Ser 0.61 0.44 - 1.00 mg/dL   Calcium 8.0 (L) 8.9 - 10.3 mg/dL   GFR calc non Af Amer >60 >60 mL/min   GFR calc Af Amer >60 >60 mL/min    Comment: (NOTE) The eGFR has been calculated using the CKD EPI equation. This calculation has not been validated in all clinical situations. eGFR's persistently <60 mL/min signify possible Chronic Kidney Disease.    Anion gap 6 5 - 15    Comment: Performed at [facility] [address], [place], [place] [address]    Studies/Results: Dg Chest 2 View  Result Date: 02/03/2018 CLINICAL DATA:  Respiratory distress, pneumonia, shortness of breath, history CHF, hypertension, atrial fibrillation EXAM: CHEST - 2 VIEW COMPARISON:  02/02/2018 FINDINGS: Enlargement of cardiac silhouette. Atherosclerotic calcification aorta. BILATERAL upper lobe infiltrates consistent with pneumonia, increased since prior exam. Small bibasilar pleural effusions and atelectasis, greater on RIGHT, increased. No pneumothorax. Bones unremarkable. IMPRESSION: Increased BILATERAL upper lobe infiltrates  consistent with pneumonia. Slight increase in small bibasilar pleural effusions and atelectasis. Electronically Signed   By: Lavonia Dana M.D.   On: 02/03/2018 13:48   Ct Abdomen Pelvis W Contrast  Result Date: 02/05/2018 CLINICAL DATA:  Iron deficiency anemia EXAM: CT ABDOMEN AND PELVIS WITH CONTRAST TECHNIQUE: Multidetector CT imaging of the abdomen and pelvis was performed using the standard protocol following bolus administration of intravenous contrast. CONTRAST:  126m OMNIPAQUE IOHEXOL 300 MG/ML  SOLN COMPARISON:  CT chest 05/15/2016 FINDINGS: Lower chest: Bilateral pleural effusions layering dependently, larger on the right than the left, with dependent atelectasis. No significant parenchymal finding in the aerated lung. The heart is mildly enlarged. Hepatobiliary: 4 mm cyst at the caudal tip of the right lobe of the liver. No other liver parenchymal lesion is seen. There is mild periportal edema. This is nonspecific finding. No calcified gallstones are seen. No sign of gallbladder inflammation. Pancreas: Normal Spleen: Normal Adrenals/Urinary Tract: Adrenal glands are normal. Kidneys are normal. The right kidney is incompletely rotated in superficial due to the patient's small size. No intrinsic pathology. The bladder appears normal. Stomach/Bowel: No bowel pathology is evident by CT. Evidence of ileus, obstruction or mass lesion. No evidence of enteritis. No visible diverticulosis. Vascular/Lymphatic: Aortic atherosclerosis. No aneurysm. IVC is normal. No retroperitoneal adenopathy. Reproductive: Previous hysterectomy.  No pelvic mass. Other: No free fluid or air. Musculoskeletal: Old appearing compression fracture at L2 with loss of height anteriorly of 50%. Advanced lower lumbar degenerative disc disease and degenerative facet disease with 1 cm of anterolisthesis at L4-5 and 5 mm at L5-S1. There are degenerative cyst within the endplates at LL9-3 There is a single 1 cm relatively lytic focus within  the S3 segment with absence of the dorsal cortex. As an isolated finding, this is of doubtful significance. If the patient did turn out to have some malignancy, this could represent a metastasis, but as an isolated finding is more likely benign. I cannot see any definite correlate on a lumbar MRI from August of 2017. 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 IMPRESSION: Bilateral pleural effusions layering dependently larger on the right than the left with dependent atelectasis. Aortic atherosclerosis. No primary bowel pathology identified to explain iron deficiency anemia. Old fracture at L2. Advanced degenerative changes in the lower lumbar spine. See above discussion for focal finding of the S3 segment of the sacrum, which as an isolated  finding is favored to be benign. Electronically Signed   By: Nelson Chimes M.D.   On: 02/05/2018 06:44    Medications: I have reviewed the patient's current medications.  Assessment: Anemia-unremarkable EGD, CAT scan shows no bowel pathology( no ileus/obstruction/mass/enteritis/diverticulosis)  Upper lobe pneumonia CHF exacerbation Atrial fibrillation Lytic lesion on the sacrum Dementia  Plan: Hemoglobin has remained stable 9.6/9.5/9.8/9.3 Rectal exam showed brown stools and CAT scan was unremarkable for bowel pathology and EGD was normal Okay to resume Xarelto from GI standpoint We will sign off, please recall if needed   Ronnette Juniper 02/05/2018, 12:35 PM   Pager [phone] If no answer or after 5 PM call [phone]

## 2018-02-05 NOTE — Progress Notes (Signed)
Initial Nutrition Assessment  DOCUMENTATION CODES:   Non-severe (moderate) malnutrition in context of chronic illness  INTERVENTION:    Ensure Enlive po BID, each supplement provides 350 kcal and 20 grams of protein  Multivitamin daily  NUTRITION DIAGNOSIS:   Moderate Malnutrition related to chronic illness(CHF) as evidenced by moderate muscle depletion, mild fat depletion, moderate fat depletion, severe muscle depletion.  GOAL:   Patient will meet greater than or equal to 90% of their needs  MONITOR:   PO intake, Supplement acceptance  REASON FOR ASSESSMENT:   Malnutrition Screening Tool    ASSESSMENT:   84 yo female with PMH of A fib, HTN, HLD, tremor, atherosclerosis, lung nodules, CHF who was admitted on 11/6 with SOB.   Patient resting with eyes closed while receiving breathing treatment during RD visit. She is unable to provide any nutrition hx at this time.  Per review of progress notes, patient refused breakfast today. Suspect breathing difficulties have been limiting oral intake. Per flow sheets, she consumed 10% of supper on 11/7.  Labs reviewed. Potassium 3.1 (L), Hgb 9.3 (L) Medications reviewed and include colace, lasix, potassium chloride, prednisone.  From review of weight encounters, weight has been stable for the past two years.   NUTRITION - FOCUSED PHYSICAL EXAM:    Most Recent Value  Orbital Region  Moderate depletion  Upper Arm Region  Mild depletion  Thoracic and Lumbar Region  Unable to assess  Buccal Region  Mild depletion  Temple Region  Moderate depletion  Clavicle Bone Region  Severe depletion  Clavicle and Acromion Bone Region  Severe depletion  Scapular Bone Region  Unable to assess  Dorsal Hand  Severe depletion  Patellar Region  No depletion  Anterior Thigh Region  No depletion  Posterior Calf Region  No depletion  Edema (RD Assessment)  Mild  Hair  Reviewed  Eyes  Unable to assess  Mouth  Unable to assess  Skin  Reviewed   Nails  Reviewed       Diet Order:   Diet Order            Diet regular Room service appropriate? Yes; Fluid consistency: Thin  Diet effective now              EDUCATION NEEDS:   No education needs have been identified at this time  Skin:  Skin Assessment: Reviewed RN Assessment  Last BM:  unknown  Height:   Ht Readings from Last 1 Encounters:  02/02/18 5\' 7"  (1.702 m)    Weight:   Wt Readings from Last 1 Encounters:  02/04/18 60.2 kg    Ideal Body Weight:  61.4 kg  BMI:  Body mass index is 20.79 kg/m.  Estimated Nutritional Needs:   Kcal:  1550-1750  Protein:  80-90 gm  Fluid:  1.5-1.7 L    Molli Barrows, RD, LDN, CNSC Pager [phone] After Hours Pager [phone]

## 2018-02-05 NOTE — Evaluation (Signed)
Physical Therapy Evaluation Patient Details Name: Megan Munoz MRN: [id number] DOB: 1932/07/25 Today's Date: 02/05/2018   History of Present Illness  Pt is a 84 y.o. F with significant PMH of a.fib on anticoagulation, hx of PNA, lung nodules, CVA, HTN, HLD, resting tremor, dementia, and melena. Found to have upper lobe pneumonia with superimposed CHF exacerbation. Pt also has lytic lesion of sacrum. EGD unremarkable.  Clinical Impression  Pt admitted with above diagnosis. Pt currently with functional limitations due to the deficits listed below (see PT Problem List). Prior to admission, patient lives with her daughter and states she ambulates independently. On PT evaluation, patient performing functional mobility at a min assist level.  Ambulating in room with handheld assist and mild unsteadiness and slow cadence. SpO2 90% on RA.  Pt will benefit from skilled PT to increase their independence and safety with mobility to allow discharge to the venue listed below.       Follow Up Recommendations Home health PT;Supervision/Assistance - 24 hour    Equipment Recommendations  None recommended by PT    Recommendations for Other Services       Precautions / Restrictions Precautions Precautions: Fall Precaution Comments: watch O2 Restrictions Weight Bearing Restrictions: No      Mobility  Bed Mobility Overal bed mobility: Needs Assistance Bed Mobility: Supine to Sit     Supine to sit: Min assist     General bed mobility comments: light min assist to elevate trunk  Transfers Overall transfer level: Needs assistance Equipment used: 1 person hand held assist Transfers: Sit to/from Stand Sit to Stand: Min assist         General transfer comment: light min assist to stand and steady at edge of bed.   Ambulation/Gait Ambulation/Gait assistance: Min guard Gait Distance (Feet): 15 Feet Assistive device: 1 person hand held assist Gait Pattern/deviations: Step-through  pattern;Decreased stride length Gait velocity: decreased   General Gait Details: Patient ambulating in room with handheld assist with mild unsteadiness noted  Stairs            Wheelchair Mobility    Modified Rankin (Stroke Patients Only)       Balance Overall balance assessment: Mild deficits observed, not formally tested                                           Pertinent Vitals/Pain Pain Assessment: No/denies pain    Home Living Family/patient expects to be discharged to:: Private residence Living Arrangements: Children(daughter) Available Help at Discharge: Family Type of Home: House Home Access: Stairs to enter   Technical brewer of Steps: 2 Home Layout: Two level Home Equipment: Environmental consultant - 2 wheels Additional Comments: Bedroom on 2nd floor. Unsure of daughter's availability to assist    Prior Function Level of Independence: Independent               Hand Dominance        Extremity/Trunk Assessment   Upper Extremity Assessment Upper Extremity Assessment: Overall WFL for tasks assessed    Lower Extremity Assessment Lower Extremity Assessment: Overall WFL for tasks assessed    Cervical / Trunk Assessment Cervical / Trunk Assessment: Normal  Communication   Communication: No difficulties  Cognition Arousal/Alertness: Awake/alert Behavior During Therapy: WFL for tasks assessed/performed Overall Cognitive Status: History of cognitive impairments - at baseline  General Comments: baseline dementia      General Comments      Exercises     Assessment/Plan    PT Assessment Patient needs continued PT services  PT Problem List Decreased strength;Decreased activity tolerance;Decreased balance;Decreased mobility;Decreased cognition       PT Treatment Interventions DME instruction;Gait training;Stair training;Functional mobility training;Therapeutic activities;Balance  training;Therapeutic exercise;Patient/family education    PT Goals (Current goals can be found in the Care Plan section)  Acute Rehab PT Goals Patient Stated Goal: go upstairs to her bedroom PT Goal Formulation: With patient Time For Goal Achievement: 02/19/18 Potential to Achieve Goals: Good    Frequency Min 3X/week   Barriers to discharge        Co-evaluation               AM-PAC PT "6 Clicks" Daily Activity  Outcome Measure Difficulty turning over in bed (including adjusting bedclothes, sheets and blankets)?: None Difficulty moving from lying on back to sitting on the side of the bed? : Unable Difficulty sitting down on and standing up from a chair with arms (e.g., wheelchair, bedside commode, etc,.)?: Unable Help needed moving to and from a bed to chair (including a wheelchair)?: A Little Help needed walking in hospital room?: A Little Help needed climbing 3-5 steps with a railing? : A Little 6 Click Score: 15    End of Session   Activity Tolerance: Patient tolerated treatment well Patient left: in chair;with call bell/phone within reach;with nursing/sitter in room Nurse Communication: Mobility status PT Visit Diagnosis: Unsteadiness on feet (R26.81);Difficulty in walking, not elsewhere classified (R26.2)    Time: 7062-3762 PT Time Calculation (min) (ACUTE ONLY): 22 min   Charges:   PT Evaluation $PT Eval Moderate Complexity: 1 Mod          Ellamae Sia, [place], DPT Acute Rehabilitation Services Pager [phone] Office [phone]   Willy Eddy 02/05/2018, 4:44 PM

## 2018-02-05 NOTE — Progress Notes (Signed)
Family Medicine Teaching Service Daily Progress Note Intern Pager: [phone]  Patient name: Megan Munoz Medical record number: [id number] Date of birth: [birth year] Age: 84 y.o. Gender: female  Primary Care Provider: Fanny Bien, MD Consultants: GI (signed off), cardiology Code Status: Full  Pt Overview and Major Events to Date:  11/6 admitted for PNA and CHF exacerbation 11/8 EGD   Assessment and Plan: Jaice Lague a 84 y.o.female whopresented with SOB 2/2 PNA and CHF exacerbation. PMH is significant forA.fib on anticoagulation, hx of PNA, lung nodules, CVA, HTN, HLD, resting tremor, dementia, and melena.   PNA with superimposed COPD exacerbation, improving: Bilateral upper lobe PNA with COPD and HFpEF exacerbation as seen on CXR. Now on 1L via [place] and afebrile. - IV cefdinir/azithromycin (11/6-11/7) now on PO cefdinir/azithromycin (11/8-) - prednisone (11/7-), continue for total 5 day burst - duonebs TID sch, albuterol nebs q4prn - Weaning O2 as tolerated  HFpEF exacerbation, improving:  Repeat echo EF 50-55% with diffuse hypokinesis and atrial dilatation. Still with bilateral pleural effusions as seen on CT abd 02/05/18.  700 cc out in last 24h, net -300cc this hospital stay, not accurate per nursing as most recent UOP mixed with stool and was unable to be measured. Weight today 129lbs up from dry weight of 123lbs - continue IV Lasix 20mg  qd - monitor I/Os, daily weights - appreciate cardiology recommendations  Iron deficiency anemia, improved and stable: Hgb 7.2>2U RBC>9.6>10.0 stable this am.Iron 9, TIBC 372, Ferritin 35. No concern for GI bleed as EGD from 11/8 without active GI bleed and only showing erythema of gastric antrum. Patient not a candidate colonoscopy, so CT abdomen/pelvis was performed showing no primary bowel pathology.  - OK to restart xarelto per GI, signed off  - monitor Hgb - Monitor vitals - Will wait to start treatment for iron deficiency anemia when  outpatient d/t current infection  Atrial fibrillation, stable:Rate controlled. HR in 80s. At home on diltiazem and coreg. Coreg was discontinued due to pulmonary status/wheezing - Continue homediltiazem - continue lopressor - restart Xarelto   Hypokalemia, resolved: K 4.3 this am - Monitor while diuresing  Lytic Lesion of Sacrum: CT abdomen/pelvis showed single lytic lesion to S3. This is not presumed to be malignant as it is an isolated finding. Patient's Ca++ is low at 8.0 and renal function is normal. Patient is however anemic (believed to be due to GI source).  - SPEP pending  Hypertension, stable:BP 109/75 - continue diltiazem and Lopressor - monitor BP  Hyperlipidemia - Continue atorvastatin  Dementia, stable. -holding home aricept and namenda while admitted  Depression: stable -Continue Prozac   GERD: - continue Protonix  FEN/GI:regular, colace/miralax, protonix Prophylaxis:xarelto  Disposition:pending medical improvement with diuresis  Subjective:  Patient states that she feels short of breath this morning even laying in bed but worse with getting up. No chest pain.  Objective: Temp:  [97.6 F (36.4 C)-98.5 F (36.9 C)] 97.9 F (36.6 C) (11/10 0005) Pulse Rate:  [74-95] 74 (11/10 0005) Resp:  [19] 19 (11/10 0005) BP: (102-127)/(61-75) 109/75 (11/10 0005) SpO2:  [94 %-98 %] 94 % (11/10 0005) Physical Exam: General: Sitting up in bed, NAD Cardiovascular: irregularly irregular, no murmurs. No JVD Respiratory: mildly increased work of breathing with tachypnea. No pursed lips or wheezing or rhonchi. Crackles in lung bases Abdomen: soft, nontender, nondistended, + bowel sounds Extremities: warm and well perfused. No cyanosis or edema  Laboratory: Recent Labs  Lab 02/03/18 1610 02/04/18 0241 02/05/18 0630 02/06/18 0234  WBC  8.2  --  8.3 7.9  HGB 9.5* 9.8* 9.3* 10.0*  HCT 30.6* 31.7* 30.1* 31.8*  PLT 212  --  228 237   Recent Labs  Lab  02/03/18 0355 02/05/18 0630 02/06/18 0234  NA 140 137 141  K 3.7 3.1* 4.3  CL 108 101 105  CO2 28 30 31   BUN 14 20 21   CREATININE 0.58 0.61 0.61  CALCIUM 8.6* 8.0* 8.7*  GLUCOSE 124* 116* 106*   BNP (last 3 results) Recent Labs    02/02/18 1253 02/04/18 0241  BNP 516.8* 301.6*    Iron/TIBC/Ferritin/ %Sat    Component Value Date/Time   IRON 9 (L) 02/02/2018 1405   TIBC 372 02/02/2018 1405   FERRITIN 35 02/02/2018 1405   IRONPCTSAT 2 (L) 02/02/2018 1405    Imaging/Diagnostic Tests: Transthoracic Echocardiography Study Date: 02/04/2018 Study Conclusions - Left ventricle: The cavity size was normal. Systolic function was normal. The estimated ejection fraction was in the range of 50% to 55%. Diffuse hypokinesis. There was no evidence of elevated ventricular filling pressure by Doppler parameters. - Mitral valve: Mildly thickened leaflets . Mild prolapse, involving the anterior leaflet. There was moderate regurgitation directed posteriorly. - Left atrium: The atrium was moderately dilated. Volume/bsa, ES, (1-plane Simpson&'s, A2C): 42.2 ml/m^2. - Right ventricle: The cavity size was moderately dilated. Wall thickness was normal. - Right atrium: The atrium was severely dilated. - Tricuspid valve: There was moderate regurgitation. - Pulmonary arteries: Systolic pressure was mildly to moderately increased. PA peak pressure: 49 mm Hg (S).  Upper GI endoscopy Procedure Date : 02/04/2018 Findings:      The examined esophagus was normal.      The Z-line was regular and was found 40 cm from the incisors.      A long strand of paper tissue was noted in the esophagus and was pushed into the gastric cavity.      Localized mildly erythematous mucosa without bleeding was found in the gastric antrum.      The cardia and gastric fundus were normal on retroflexion.      The examined duodenum was normal. Impression:                - Normal esophagus. Paper tissue noted in esophagus,  pushed in to the gastric cavity. - Z-line regular, 40 cm from the incisors. - Erythematous mucosa in the antrum. - Normal examined duodenum. - No specimens collected.  No results found. Bufford Lope, DO 02/06/2018, 6:23 AM PGY-3, [place] Intern pager: [phone], text pages welcome

## 2018-02-06 DIAGNOSIS — I272 Pulmonary hypertension, unspecified: Secondary | ICD-10-CM

## 2018-02-06 DIAGNOSIS — R0603 Acute respiratory distress: Secondary | ICD-10-CM

## 2018-02-06 LAB — CBC
HCT: 31.8 % — ABNORMAL LOW (ref 36.0–46.0)
Hemoglobin: 10 g/dL — ABNORMAL LOW (ref 12.0–15.0)
MCH: 29.5 pg (ref 26.0–34.0)
MCHC: 31.4 g/dL (ref 30.0–36.0)
MCV: 93.8 fL (ref 80.0–100.0)
PLATELETS: 237 10*3/uL (ref 150–400)
RBC: 3.39 MIL/uL — ABNORMAL LOW (ref 3.87–5.11)
RDW: 13.5 % (ref 11.5–15.5)
WBC: 7.9 10*3/uL (ref 4.0–10.5)
nRBC: 0 % (ref 0.0–0.2)

## 2018-02-06 LAB — BASIC METABOLIC PANEL
Anion gap: 5 (ref 5–15)
BUN: 21 mg/dL (ref 8–23)
CALCIUM: 8.7 mg/dL — AB (ref 8.9–10.3)
CHLORIDE: 105 mmol/L (ref 98–111)
CO2: 31 mmol/L (ref 22–32)
CREATININE: 0.61 mg/dL (ref 0.44–1.00)
GFR calc Af Amer: >60 mL/min (ref >60)
GFR calc non Af Amer: >60 mL/min (ref >60)
Glucose, Bld: 106 mg/dL — ABNORMAL HIGH (ref 70–99)
Potassium: 4.3 mmol/L (ref 3.5–5.1)
Sodium: 141 mmol/L (ref 135–145)

## 2018-02-06 MED ORDER — POLYETHYLENE GLYCOL 3350 17 G PO PACK
17.0000 g | PACK | Freq: Two times a day (BID) | ORAL | Status: DC
  Administered 2018-02-06: 17 g via ORAL
  Filled 2018-02-06: qty 1

## 2018-02-06 MED ORDER — RIVAROXABAN 15 MG PO TABS
15.0000 mg | ORAL_TABLET | Freq: Every day | ORAL | Status: DC
  Administered 2018-02-06: 15 mg via ORAL
  Filled 2018-02-06: qty 1

## 2018-02-06 MED ORDER — PANTOPRAZOLE SODIUM 40 MG PO TBEC
40.0000 mg | DELAYED_RELEASE_TABLET | Freq: Every day | ORAL | Status: DC
  Administered 2018-02-06 – 2018-02-07 (×2): 40 mg via ORAL
  Filled 2018-02-06 (×2): qty 1

## 2018-02-06 MED ORDER — FUROSEMIDE 40 MG PO TABS
40.0000 mg | ORAL_TABLET | Freq: Every day | ORAL | Status: DC
  Administered 2018-02-07: 40 mg via ORAL
  Filled 2018-02-06: qty 1

## 2018-02-06 MED ORDER — RIVAROXABAN 20 MG PO TABS: 20.0000 mg | ORAL_TABLET | Freq: Every day | ORAL | Status: DC

## 2018-02-06 MED ORDER — POTASSIUM CHLORIDE CRYS ER 20 MEQ PO TBCR: 40.0000 meq | EXTENDED_RELEASE_TABLET | Freq: Two times a day (BID) | ORAL | Status: DC

## 2018-02-06 NOTE — Evaluation (Signed)
Occupational Therapy Evaluation Patient Details Name: Megan Munoz MRN: [id number] DOB: [birth year] Today's Date: 02/06/2018    History of Present Illness Pt is a 84 y.o. F with significant PMH of a.fib on anticoagulation, hx of PNA, lung nodules, CVA, HTN, HLD, resting tremor, dementia, and melena. Found to have upper lobe pneumonia with superimposed CHF exacerbation. Pt also has lytic lesion of sacrum. EGD unremarkable.   Clinical Impression   PTA, pt was living with her daughter and family and was independent with ADLs and simple IADLs. Currently, pt requires Supervision-Min Guard A for ADLs and functional mobility using RW. Pt family with several questions about HH therapy and rehabs. Answered all pt and family questions. Pt presenting with decreased strength and activity tolerance. SpO2 staying in 90s on RA throughout session. Pt would benefit from further acute OT to facilitate safe dc and provide education on energy conservation. Recommend dc to home with HHOT for further OT to optimize safety, independence with ADLs, and return to PLOF.      Follow Up Recommendations  Home health OT;Supervision/Assistance - 24 hour    Equipment Recommendations  None recommended by OT    Recommendations for Other Services PT consult     Precautions / Restrictions Precautions Precautions: Fall Precaution Comments: watch O2 Restrictions Weight Bearing Restrictions: No      Mobility Bed Mobility Overal bed mobility: Modified Independent Bed Mobility: Sit to Supine;Supine to Sit     Supine to sit: Modified independent (Device/Increase time) Sit to supine: Modified independent (Device/Increase time)   General bed mobility comments: Increased time  Transfers Overall transfer level: Needs assistance Equipment used: 1 person hand held assist Transfers: Sit to/from Stand Sit to Stand: Min guard         General transfer comment: Min Guard A for safety.     Balance Overall balance  assessment: Mild deficits observed, not formally tested                                         ADL either performed or assessed with clinical judgement   ADL Overall ADL's : Needs assistance/impaired Eating/Feeding: Independent;Sitting   Grooming: Supervision/safety;Set up;Wash/dry face;Wash/dry hands;Standing;Brushing hair Grooming Details (indicate cue type and reason): Pt presenting with tremors in BUEs impacting her cooridnation and grasp. Motivated to perform tasks and requiring supervision for safety Upper Body Bathing: Supervision/ safety;Set up;Sitting   Lower Body Bathing: Min guard;Sit to/from stand   Upper Body Dressing : Supervision/safety;Set up;Sitting   Lower Body Dressing: Min guard;Sit to/from stand   Toilet Transfer: Min guard;Stand-pivot;BSC   Toileting- Water quality scientist and Hygiene: Supervision/safety;Set up;Sitting/lateral lean       Functional mobility during ADLs: Min guard;Minimal assistance General ADL Comments: Pt presenting with decreased strength and activity tolerance. Pt SpO2 remained in 90s throughout session on RA.     Vision         Perception     Praxis      Pertinent Vitals/Pain Pain Assessment: No/denies pain     Hand Dominance Right   Extremity/Trunk Assessment Upper Extremity Assessment Upper Extremity Assessment: Overall WFL for tasks assessed(Bilateral tremors)   Lower Extremity Assessment Lower Extremity Assessment: Defer to PT evaluation   Cervical / Trunk Assessment Cervical / Trunk Assessment: Normal   Communication Communication Communication: No difficulties   Cognition Arousal/Alertness: Awake/alert Behavior During Therapy: WFL for tasks assessed/performed Overall Cognitive Status: History of cognitive impairments -  at baseline                                 General Comments: baseline dementia   General Comments  Daughter and son in law arriving at end of session.  Several questions about HH services, possible rehab, energy conservation, RW vs cane, anemia, SpO2, etc.     Exercises     Shoulder Instructions      Home Living Family/patient expects to be discharged to:: Private residence Living Arrangements: Children(Daughter and her family) Available Help at Discharge: Family Type of Home: House Home Access: Stairs to enter Technical brewer of Steps: 2   Home Layout: Two level Alternate Level Stairs-Number of Steps: 16 Alternate Level Stairs-Rails: Right Bathroom Shower/Tub: Tub/shower unit;Door   [employer] Toilet: Standard     Home Equipment: Environmental consultant - 2 wheels;Shower seat;Cane - single point   Additional Comments: Bedroom on 2nd floor.       Prior Functioning/Environment Level of Independence: Independent                 OT Problem List: Decreased activity tolerance;Impaired balance (sitting and/or standing);Decreased knowledge of use of DME or AE;Decreased knowledge of precautions;Cardiopulmonary status limiting activity      OT Treatment/Interventions: Self-care/ADL training;Therapeutic exercise;Energy conservation;DME and/or AE instruction;Therapeutic activities;Patient/family education    OT Goals(Current goals can be found in the care plan section) Acute Rehab OT Goals Patient Stated Goal: go upstairs to her bedroom OT Goal Formulation: With patient Time For Goal Achievement: 02/20/18 Potential to Achieve Goals: Good  OT Frequency: Min 2X/week   Barriers to D/C:            Co-evaluation              AM-PAC PT "6 Clicks" Daily Activity     Outcome Measure Help from another person eating meals?: None Help from another person taking care of personal grooming?: A Little Help from another person toileting, which includes using toliet, bedpan, or urinal?: A Little Help from another person bathing (including washing, rinsing, drying)?: A Little Help from another person to put on and taking off regular upper  body clothing?: None Help from another person to put on and taking off regular lower body clothing?: A Little 6 Click Score: 20   End of Session Nurse Communication: Mobility status  Activity Tolerance: Patient tolerated treatment well Patient left: in bed;with call bell/phone within reach;with bed alarm set;with family/visitor present  OT Visit Diagnosis: Unsteadiness on feet (R26.81);Muscle weakness (generalized) (M62.81)                Time: 4010-2725 OT Time Calculation (min): 41 min Charges:  OT General Charges $OT Visit: 1 Visit OT Evaluation $OT Eval Moderate Complexity: 1 Mod OT Treatments $Self Care/Home Management : 23-37 mins  [place], OTR/L Acute Rehab Pager: [phone] Office: [place] 02/06/2018, 5:01 PM

## 2018-02-06 NOTE — Progress Notes (Signed)
Progress Note  Patient Name: Megan Munoz Date of Encounter: 02/06/2018  Primary Cardiologist:   No primary care provider on file.   Subjective   Her breathing is OK in the afternoon but not great in the morning per her report.  No pain.    Inpatient Medications    Scheduled Meds: . atorvastatin  20 mg Oral q1800  . calcitonin (salmon)  1 spray Alternating Nares Daily  . cefdinir  300 mg Oral Q12H  . diltiazem  240 mg Oral Daily  . docusate sodium  200 mg Oral BID  . feeding supplement (ENSURE ENLIVE)  237 mL Oral BID BM  . FLUoxetine  20 mg Oral Daily  . furosemide  20 mg Intravenous Daily  . metoprolol tartrate  50 mg Oral BID  . multivitamin with minerals  1 tablet Oral Daily  . pantoprazole  40 mg Oral Daily  . polyethylene glycol  17 g Oral BID  . predniSONE  40 mg Oral QAC breakfast  . rivaroxaban  15 mg Oral Q supper   Continuous Infusions:  PRN Meds: albuterol   Vital Signs    Vitals:   02/06/18 0005 02/06/18 0700 02/06/18 0730 02/06/18 0807  BP: 109/75   (!) 89/70  Pulse: 74   89  Resp: 19   18  Temp: 97.9 F (36.6 C)   97.6 F (36.4 C)  TempSrc: Oral   Oral  SpO2: 94%  95% 98%  Weight:  58.7 kg    Height:        Intake/Output Summary (Last 24 hours) at 02/06/2018 1229 Last data filed at 02/05/2018 1630 Gross per 24 hour  Intake 360 ml  Output 550 ml  Net -190 ml   Filed Weights   02/02/18 1245 02/04/18 0600 02/06/18 0700  Weight: 59.9 kg 60.2 kg 58.7 kg    Telemetry    NA - Personally Reviewed  ECG     - Personally Reviewed  Physical Exam   GEN: No acute distress.   Neck: No  JVD Cardiac: Irregular RR, no murmurs, rubs, or gallops.  Respiratory:  decreased breath sounds with few course crackles bilaterally. GI: Soft, nontender, non-distended  MS: No  edema; No deformity. Neuro:  Nonfocal  Psych: Normal affect   Labs    Chemistry Recent Labs  Lab 02/03/18 0355 02/05/18 0630 02/06/18 0234  NA 140 137 141  K 3.7 3.1*  4.3  CL 108 101 105  CO2 28 30 31   GLUCOSE 124* 116* 106*  BUN 14 20 21   CREATININE 0.58 0.61 0.61  CALCIUM 8.6* 8.0* 8.7*  GFRNONAA >60 >60 >60  GFRAA >60 >60 >60  ANIONGAP 4* 6 5     Hematology Recent Labs  Lab 02/03/18 1610 02/04/18 0241 02/05/18 0630 02/06/18 0234  WBC 8.2  --  8.3 7.9  RBC 3.23*  --  3.22* 3.39*  HGB 9.5* 9.8* 9.3* 10.0*  HCT 30.6* 31.7* 30.1* 31.8*  MCV 94.7  --  93.5 93.8  MCH 29.4  --  28.9 29.5  MCHC 31.0  --  30.9 31.4  RDW 14.9  --  13.9 13.5  PLT 212  --  228 237    Cardiac EnzymesNo results for input(s): TROPONINI in the last 168 hours.  Recent Labs  Lab 02/02/18 1300  TROPIPOC 0.00     BNP Recent Labs  Lab 02/02/18 1253 02/04/18 0241  BNP 516.8* 301.6*     DDimer No results for input(s): DDIMER in the last  168 hours.   Radiology    Ct Abdomen Pelvis W Contrast  Result Date: 02/05/2018 CLINICAL DATA:  Iron deficiency anemia EXAM: CT ABDOMEN AND PELVIS WITH CONTRAST TECHNIQUE: Multidetector CT imaging of the abdomen and pelvis was performed using the standard protocol following bolus administration of intravenous contrast. CONTRAST:  150mL OMNIPAQUE IOHEXOL 300 MG/ML  SOLN COMPARISON:  CT chest 05/15/2016 FINDINGS: Lower chest: Bilateral pleural effusions layering dependently, larger on the right than the left, with dependent atelectasis. No significant parenchymal finding in the aerated lung. The heart is mildly enlarged. Hepatobiliary: 4 mm cyst at the caudal tip of the right lobe of the liver. No other liver parenchymal lesion is seen. There is mild periportal edema. This is nonspecific finding. No calcified gallstones are seen. No sign of gallbladder inflammation. Pancreas: Normal Spleen: Normal Adrenals/Urinary Tract: Adrenal glands are normal. Kidneys are normal. The right kidney is incompletely rotated in superficial due to the patient's small size. No intrinsic pathology. The bladder appears normal. Stomach/Bowel: No bowel  pathology is evident by CT. Evidence of ileus, obstruction or mass lesion. No evidence of enteritis. No visible diverticulosis. Vascular/Lymphatic: Aortic atherosclerosis. No aneurysm. IVC is normal. No retroperitoneal adenopathy. Reproductive: Previous hysterectomy.  No pelvic mass. Other: No free fluid or air. Musculoskeletal: Old appearing compression fracture at L2 with loss of height anteriorly of 50%. Advanced lower lumbar degenerative disc disease and degenerative facet disease with 1 cm of anterolisthesis at L4-5 and 5 mm at L5-S1. There are degenerative cyst within the endplates at Q0-0. There is a single 1 cm relatively lytic focus within the S3 segment with absence of the dorsal cortex. As an isolated finding, this is of doubtful significance. If the patient did turn out to have some malignancy, this could represent a metastasis, but as an isolated finding is more likely benign. I cannot see any definite correlate on a lumbar MRI from August of 2017. 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 IMPRESSION: Bilateral pleural effusions layering dependently larger on the right than the left with dependent atelectasis. Aortic atherosclerosis. No primary bowel pathology identified to explain iron deficiency anemia. Old fracture at L2. Advanced degenerative changes in the lower lumbar spine. See above discussion for focal finding of the S3 segment of the sacrum, which as an isolated finding is favored to be benign. Electronically Signed   By: Nelson Chimes M.D.   On: 02/05/2018 06:44    Cardiac Studies   Echo  Study Conclusions  - Left ventricle: The cavity size was normal. Systolic function was   normal. The estimated ejection fraction was in the range of 50%   to 55%. Diffuse hypokinesis. There was no evidence of elevated   ventricular filling pressure by Doppler parameters. - Mitral valve: Mildly thickened leaflets . Mild prolapse,   involving the  anterior leaflet. There was moderate regurgitation   directed posteriorly. - Left atrium: The atrium was moderately dilated. Volume/bsa, ES,   (1-plane Simpson&'s, A2C): 42.2 ml/m^2. - Right ventricle: The cavity size was moderately dilated. Wall   thickness was normal. - Right atrium: The atrium was severely dilated. - Tricuspid valve: There was moderate regurgitation. - Pulmonary arteries: Systolic pressure was mildly to moderately   increased. PA peak pressure: 49 mm Hg (S).   Patient Profile     84 y.o. female with a hx of permanent atrial fibrillation managed with rate control therapy, h/o CVA, chronic anticoagulation w/ Xarelto, chronic diastolic HF,mild MR,coronary calcifications  noted on CT, pulmonary nodules, HTN, HLD and prior h/o bladder cancer treated w/ surgery who is being seen for the evaluation ofdyspnea, in the setting of bilateral PNA,anemia from suspected GIB and CHFat the request ofDr. Ouida Sills, [facility].  Assessment & Plan    DYSPNEA:  Related to pneumonia.  Some element of acute on chronic diastolic dysfunction.  Negative 301 cc.  Change to PO Lasix today.       ATRIAL FIB:   Xarelto restarted.   Rate controlled.     NSVT:  Not on tele.    MR: Moderate.  Will follow clinically.     For questions or updates, please contact [place] Please consult [email or web address] for contact info under Cardiology/STEMI.   Signed, Minus Breeding, MD  02/06/2018, 12:29 PM

## 2018-02-07 DIAGNOSIS — I5033 Acute on chronic diastolic (congestive) heart failure: Secondary | ICD-10-CM

## 2018-02-07 DIAGNOSIS — I272 Pulmonary hypertension, unspecified: Secondary | ICD-10-CM | POA: Diagnosis present

## 2018-02-07 LAB — CBC
HCT: 33.5 % — ABNORMAL LOW (ref 36.0–46.0)
Hemoglobin: 10.4 g/dL — ABNORMAL LOW (ref 12.0–15.0)
MCH: 29 pg (ref 26.0–34.0)
MCHC: 31 g/dL (ref 30.0–36.0)
MCV: 93.3 fL (ref 80.0–100.0)
PLATELETS: 262 10*3/uL (ref 150–400)
RBC: 3.59 MIL/uL — AB (ref 3.87–5.11)
RDW: 13.4 % (ref 11.5–15.5)
WBC: 8.6 10*3/uL (ref 4.0–10.5)
nRBC: 0 % (ref 0.0–0.2)

## 2018-02-07 LAB — BASIC METABOLIC PANEL
ANION GAP: 7 (ref 5–15)
BUN: 16 mg/dL (ref 8–23)
CALCIUM: 8.5 mg/dL — AB (ref 8.9–10.3)
CO2: 30 mmol/L (ref 22–32)
Chloride: 102 mmol/L (ref 98–111)
Creatinine, Ser: 0.46 mg/dL (ref 0.44–1.00)
GFR calc Af Amer: >60 mL/min (ref >60)
GFR calc non Af Amer: >60 mL/min (ref >60)
Glucose, Bld: 103 mg/dL — ABNORMAL HIGH (ref 70–99)
Potassium: 3.5 mmol/L (ref 3.5–5.1)
SODIUM: 139 mmol/L (ref 135–145)

## 2018-02-07 LAB — CULTURE, BLOOD (ROUTINE X 2)
Culture: NO GROWTH
Culture: NO GROWTH
SPECIAL REQUESTS: ADEQUATE
Special Requests: ADEQUATE

## 2018-02-07 MED ORDER — CEFDINIR 300 MG PO CAPS: 300.0000 mg | ORAL_CAPSULE | Freq: Two times a day (BID) | ORAL | 0 refills | Status: AC

## 2018-02-07 MED ORDER — PREDNISONE 20 MG PO TABS: 40.0000 mg | ORAL_TABLET | Freq: Every day | ORAL | 0 refills | Status: DC

## 2018-02-07 MED ORDER — FERROUS SULFATE 325 (65 FE) MG PO TABS: 325.0000 mg | ORAL_TABLET | ORAL | 0 refills | Status: DC

## 2018-02-07 MED ORDER — ATORVASTATIN CALCIUM 20 MG PO TABS: 20.0000 mg | ORAL_TABLET | Freq: Every day | ORAL | 0 refills | Status: DC

## 2018-02-07 MED ORDER — RIVAROXABAN 15 MG PO TABS: 15.0000 mg | ORAL_TABLET | Freq: Every day | ORAL | 0 refills | Status: DC

## 2018-02-07 NOTE — Progress Notes (Signed)
Progress Note  Patient Name: Megan Munoz Date of Encounter: 02/07/2018  Primary Cardiologist: Glenetta Hew, MD   Subjective    Changes.  No chest pain.  Shortness of breath seems to be at baseline.  Inpatient Medications    Scheduled Meds: . atorvastatin  20 mg Oral q1800  . calcitonin (salmon)  1 spray Alternating Nares Daily  . diltiazem  240 mg Oral Daily  . docusate sodium  200 mg Oral BID  . feeding supplement (ENSURE ENLIVE)  237 mL Oral BID BM  . FLUoxetine  20 mg Oral Daily  . furosemide  40 mg Oral Daily  . metoprolol tartrate  50 mg Oral BID  . multivitamin with minerals  1 tablet Oral Daily  . pantoprazole  40 mg Oral Daily  . polyethylene glycol  17 g Oral BID  . predniSONE  40 mg Oral QAC breakfast  . rivaroxaban  15 mg Oral Q supper   Continuous Infusions:  PRN Meds: albuterol   Vital Signs    Vitals:   02/06/18 1719 02/07/18 0002 02/07/18 0500 02/07/18 0828  BP: (!) 141/74 (!) 144/77  (!) 146/73  Pulse: (!) 101 93  (!) 112  Resp: 16 15    Temp: 97.8 F (36.6 C) 98.1 F (36.7 C)  97.8 F (36.6 C)  TempSrc: Oral Oral    SpO2: 96% 93%  96%  Weight:   57.5 kg   Height:        Intake/Output Summary (Last 24 hours) at 02/07/2018 1350 Last data filed at 02/07/2018 0800 Gross per 24 hour  Intake 780 ml  Output 300 ml  Net 480 ml   Filed Weights   02/04/18 0600 02/06/18 0700 02/07/18 0500  Weight: 60.2 kg 58.7 kg 57.5 kg    Telemetry    Atrial fibrillation, rate controlled- Personally Reviewed  ECG    No new EKG- Personally Reviewed  Physical Exam   GEN: No acute distress.  Elderly Neck: No JVD Cardiac:  Irregularly irregular, no murmurs, rubs, or gallops.  Respiratory: Clear to auscultation bilaterally. GI: Soft, nontender, non-distended  MS: No edema; No deformity. Neuro:  Nonfocal  Psych: Normal affect   Labs    Chemistry Recent Labs  Lab 02/05/18 0630 02/06/18 0234 02/07/18 0233  NA 137 141 139  K 3.1* 4.3 3.5    CL 101 105 102  CO2 30 31 30   GLUCOSE 116* 106* 103*  BUN 20 21 16   CREATININE 0.61 0.61 0.46  CALCIUM 8.0* 8.7* 8.5*  GFRNONAA >60 >60 >60  GFRAA >60 >60 >60  ANIONGAP 6 5 7      Hematology Recent Labs  Lab 02/05/18 0630 02/06/18 0234 02/07/18 0233  WBC 8.3 7.9 8.6  RBC 3.22* 3.39* 3.59*  HGB 9.3* 10.0* 10.4*  HCT 30.1* 31.8* 33.5*  MCV 93.5 93.8 93.3  MCH 28.9 29.5 29.0  MCHC 30.9 31.4 31.0  RDW 13.9 13.5 13.4  PLT 228 237 262    Cardiac EnzymesNo results for input(s): TROPONINI in the last 168 hours.  Recent Labs  Lab 02/02/18 1300  TROPIPOC 0.00     BNP Recent Labs  Lab 02/02/18 1253 02/04/18 0241  BNP 516.8* 301.6*     DDimer No results for input(s): DDIMER in the last 168 hours.   Radiology    No results found.  Cardiac Studies   - Left ventricle: The cavity size was normal. Systolic function was   normal. The estimated ejection fraction was in the range of  50%   to 55%. Diffuse hypokinesis. There was no evidence of elevated   ventricular filling pressure by Doppler parameters. - Mitral valve: Mildly thickened leaflets . Mild prolapse,   involving the anterior leaflet. There was moderate regurgitation   directed posteriorly. - Left atrium: The atrium was moderately dilated. Volume/bsa, ES,   (1-plane Simpson&'s, A2C): 42.2 ml/m^2. - Right ventricle: The cavity size was moderately dilated. Wall   thickness was normal. - Right atrium: The atrium was severely dilated. - Tricuspid valve: There was moderate regurgitation. - Pulmonary arteries: Systolic pressure was mildly to moderately   increased. PA peak pressure: 49 mm Hg (S).  Patient Profile     84 y.o. female with permanent atrial fibrillation, history of stroke, chronic anticoagulations with Xarelto, chronic diastolic heart failure, coronary calcification noted on CT seen for evaluation of dyspnea in the setting of bilateral pneumonia GI bleed suspected.  Assessment & Plan     Dyspnea - Agree that this was likely related to pneumonia with a degree of diastolic heart dysfunction and anemia.  Now on p.o. Lasix.  Atrial fibrillation permanent - Xarelto.  Rate controlled.  Watch for any evidence of GI bleeding.  Mitral regurgitation -Moderate.  Follow clinically.  NSVT -Previous 4 beat run noted on telemetry earlier in this admission.  Carvedilol was discontinued due to pulmonary wheezing.  On Cardizem.  Echocardiogram with reassuring EF.  Anemia -Hemoglobin was 7.2 on admission ferritin was low at 35.  Xarelto was held initially, transfusion was given initially.  GI evaluation took place.  I am okay with her discharge from a cardiology perspective.  [facility] will sign off.   Medication Recommendations: Is probably a good idea for her to go out on 40 mg of Lasix daily for the time being.  Please make sure she has a basic metabolic profile in 1 to 2 weeks.   Other recommendations (labs, testing, etc): None Follow up as an outpatient: As previously scheduled  For questions or updates, please contact [place] Please consult [email or web address] for contact info under        Signed, Candee Furbish, MD  02/07/2018, 1:50 PM

## 2018-02-07 NOTE — Discharge Instructions (Addendum)
Dear [name],   Thank you for letting us participate in your care! In this section, you will find a brief hospital admission summary of why you were admitted to the hospital, what happened during your admission, your diagnosis/diagnoses, and recommended follow up. Please see medications section of this packet for any medication changes. Please let PCP/Specialists know of any changes that were made.    You were admitted because you were experiencing shortness of breath.   Your initial work up was significant for upper lobe pneumonias on both sides, CHF exacerbation, and significant anemia.   You were treated with antibiotics for the pneumonia is.  For the CHF exacerbation, we have decreased her fluid load with IV Lasix.  For your anemia, we transfused 2 units of blood and we recommend following up with your primary care provider to take iron every other day.  Your shortness of breath improved and you were discharged from the hospital for meeting this goal.    POST-HOSPITAL/FOLLOW-UP CARE INSTRUCTIONS Home instructions:  1. Monitor breathing status.  If you have any difficulties breathing, please call the office or if in emergency, please go to the closest ED. 2. Take your oral Lasix every other day as needed for fluid overload.  3. We recommend home health physical therapy which will be arranged prior to her discharge 4. Please speak to your primary care provider about starting COPD maintenance medications. 5. Please look at the end of this packet for any medication changes.  Doctors appointments & follow up:  Future Appointments  Date Time Provider [place]  07/26/2018 10:15 AM Dennie Bible, NP [facility] None    Thank you for choosing [facility]! Take care and be well!  [facility]  [place], [place] [address] [phone]     Information on my medicine -  XARELTO (Rivaroxaban)  Why was Xarelto prescribed for you? Xarelto was prescribed for you to reduce the risk of a blood clot forming that can cause a stroke if you have a medical condition called atrial fibrillation (a type of irregular heartbeat).  What do you need to know about xarelto ? Take your Xarelto ONCE DAILY at the same time every day with your evening meal. If you have difficulty swallowing the tablet whole, you may crush it and mix in applesauce just prior to taking your dose.  Take Xarelto exactly as prescribed by your doctor and DO NOT stop taking Xarelto without talking to the doctor who prescribed the medication.  Stopping without other stroke prevention medication to take the place of Xarelto may increase your risk of developing a clot that causes a stroke.  Refill your prescription before you run out.  After discharge, you should have regular check-up appointments with your healthcare provider that is prescribing your Xarelto.  In the future your dose may need to be changed if your kidney function or weight changes by a significant amount.  What do you do if you miss a dose? If you are taking Xarelto ONCE DAILY and you miss a dose, take it as soon as you remember on the same day then continue your regularly scheduled once daily regimen the next day. Do not take two doses of Xarelto at the same time or on the same day.   Important Safety Information A possible side effect of Xarelto is bleeding. You should call your healthcare provider right away if you experience any  of the following: ? Bleeding from an injury or your nose that does not stop. ? Unusual colored urine (red or dark brown) or unusual colored stools (red or black). ? Unusual bruising for unknown reasons. ? A serious fall or if you hit your head (even if there is no bleeding).  Some medicines may interact with Xarelto and might increase your risk of bleeding while on Xarelto. To help avoid this,  consult your healthcare provider or pharmacist prior to using any new prescription or non-prescription medications, including herbals, vitamins, non-steroidal anti-inflammatory drugs (NSAIDs) and supplements.  This website has more information on Xarelto: [email or web address]

## 2018-02-07 NOTE — Care Management (Signed)
#    1.  S/W ROSSEY  @  [facility] [employer] # [phone]   RIVAROXABAN: NONE FORMULARY  XARELTO  15 MGF BID COVER- YES CO-PAY- $ 28.00 TIER- 2 DRUG PRIOR APPROVAL- NO  PREFERRED PHARMACY ; YES [facility] AND [facility]

## 2018-02-07 NOTE — Progress Notes (Signed)
..  Patient given all discharge paper work. All pages reviewed and subsequent questions answered. Peripheral IV's discontinued without complications. Patients belongings returned. Patient placed in wheel chair and escorted by staff member to pick up location.

## 2018-02-07 NOTE — Progress Notes (Signed)
OT Cancellation Note  Patient Details Name: Megan Munoz MRN: [id number] DOB: 03-04-1933   Cancelled Treatment:    Reason Eval/Treat Not Completed: Patient declined, no reason specified. Declined participation, as pt would like to take a nap. Provided energy conservation handout for daughter/pt to review.  Will continue to follow and see as able.   Delight Stare, OT Acute Rehabilitation Services Pager [phone] Office [phone]   Delight Stare 02/07/2018, 2:17 PM

## 2018-02-07 NOTE — Progress Notes (Signed)
Physical Therapy Treatment Patient Details Name: Megan Munoz MRN: [id number] DOB: 1932-03-31 Today's Date: 02/07/2018    History of Present Illness Pt is a 84 y.o. F with significant PMH of a.fib on anticoagulation, hx of PNA, lung nodules, CVA, HTN, HLD, resting tremor, dementia, and melena. Found to have upper lobe pneumonia with superimposed CHF exacerbation. Pt also has lytic lesion of sacrum. EGD unremarkable.    PT Comments    Patient progressing this session to hallway ambulation x 2.  Continues with dyspnea on exertion limiting mobility, though SpO2 91% on RA, HR 115.  Unable to attempt stairs this session, but encouraged family to help her walk at least once more today and to assist on stairs at home and allow to use walker initially.  PT to follow acutely and continue to recommend resuming HHPT at d/c.    Follow Up Recommendations  Home health PT;Supervision/Assistance - 24 hour     Equipment Recommendations  None recommended by PT    Recommendations for Other Services       Precautions / Restrictions Precautions Precautions: Fall Precaution Comments: watch O2    Mobility  Bed Mobility Overal bed mobility: Modified Independent       Supine to sit: Modified independent (Device/Increase time) Sit to supine: Modified independent (Device/Increase time)      Transfers Overall transfer level: Needs assistance Equipment used: None;Straight cane Transfers: Sit to/from Stand Sit to Stand: Supervision;Min guard         General transfer comment: initial attempt with posterior LOB sitting back on bed, then each subsequent attempt stood unaided wiyh increased time ans S for safety  Ambulation/Gait Ambulation/Gait assistance: Min assist;Min guard;Supervision Gait Distance (Feet): 100 Feet(x 2) Assistive device: None;Straight cane Gait Pattern/deviations: Step-through pattern;Decreased stride length;Drifts right/left;Scissoring;Trunk flexed     General Gait Details:  initially no device, noted R foot incoordination and occasional scissoring with minguard for safety; with cane initially S, then LOB to R with min A for recovery   Stairs             Wheelchair Mobility    Modified Rankin (Stroke Patients Only)       Balance Overall balance assessment: Needs assistance   Sitting balance-Leahy Scale: Good       Standing balance-Leahy Scale: Fair                              Cognition Arousal/Alertness: Awake/alert Behavior During Therapy: WFL for tasks assessed/performed Overall Cognitive Status: History of cognitive impairments - at baseline                                 General Comments: baseline dementia      Exercises Other Exercises Other Exercises: sit<>stand x 5    General Comments General comments (skin integrity, edema, etc.): HR max 115, SpO2 91%; educated on need for assist with stairs at home and use of walker initially upon going home.       Pertinent Vitals/Pain Pain Assessment: No/denies pain    Home Living                      Prior Function            PT Goals (current goals can now be found in the care plan section) Progress towards PT goals: Progressing toward goals    Frequency  Min 3X/week      PT Plan Current plan remains appropriate    Co-evaluation              AM-PAC PT "6 Clicks" Daily Activity  Outcome Measure  Difficulty turning over in bed (including adjusting bedclothes, sheets and blankets)?: None Difficulty moving from lying on back to sitting on the side of the bed? : None Difficulty sitting down on and standing up from a chair with arms (e.g., wheelchair, bedside commode, etc,.)?: Unable Help needed moving to and from a bed to chair (including a wheelchair)?: A Little Help needed walking in hospital room?: A Little Help needed climbing 3-5 steps with a railing? : A Lot 6 Click Score: 17    End of Session Equipment Utilized  During Treatment: Gait belt Activity Tolerance: Patient limited by fatigue Patient left: in bed;with call bell/phone within reach;with family/visitor present   PT Visit Diagnosis: Other abnormalities of gait and mobility (R26.89);Muscle weakness (generalized) (M62.81)     Time: 4239-5320 PT Time Calculation (min) (ACUTE ONLY): 38 min  Charges:  $Gait Training: 23-37 mins $Self Care/Home Management: [place], [place] [phone] 02/07/2018    Reginia Naas 02/07/2018, 1:23 PM

## 2018-02-08 DIAGNOSIS — F0390 Unspecified dementia without behavioral disturbance: Secondary | ICD-10-CM | POA: Diagnosis not present

## 2018-02-08 DIAGNOSIS — Z7901 Long term (current) use of anticoagulants: Secondary | ICD-10-CM | POA: Diagnosis not present

## 2018-02-08 DIAGNOSIS — I110 Hypertensive heart disease with heart failure: Secondary | ICD-10-CM | POA: Diagnosis not present

## 2018-02-08 DIAGNOSIS — I69398 Other sequelae of cerebral infarction: Secondary | ICD-10-CM | POA: Diagnosis not present

## 2018-02-08 DIAGNOSIS — I4891 Unspecified atrial fibrillation: Secondary | ICD-10-CM | POA: Diagnosis not present

## 2018-02-08 DIAGNOSIS — I509 Heart failure, unspecified: Secondary | ICD-10-CM | POA: Diagnosis not present

## 2018-02-08 LAB — PROTEIN ELECTROPHORESIS, SERUM
A/G Ratio: 1 (ref 0.7–1.7)
Albumin ELP: 2.6 g/dL — ABNORMAL LOW (ref 2.9–4.4)
Alpha-1-Globulin: 0.5 g/dL — ABNORMAL HIGH (ref 0.0–0.4)
Alpha-2-Globulin: 0.8 g/dL (ref 0.4–1.0)
Beta Globulin: 0.8 g/dL (ref 0.7–1.3)
GAMMA GLOBULIN: 0.6 g/dL (ref 0.4–1.8)
Globulin, Total: 2.6 g/dL (ref 2.2–3.9)
Total Protein ELP: 5.2 g/dL — ABNORMAL LOW (ref 6.0–8.5)

## 2018-02-10 DIAGNOSIS — F0390 Unspecified dementia without behavioral disturbance: Secondary | ICD-10-CM | POA: Diagnosis not present

## 2018-02-10 DIAGNOSIS — I4891 Unspecified atrial fibrillation: Secondary | ICD-10-CM | POA: Diagnosis not present

## 2018-02-10 DIAGNOSIS — Z7901 Long term (current) use of anticoagulants: Secondary | ICD-10-CM | POA: Diagnosis not present

## 2018-02-10 DIAGNOSIS — I509 Heart failure, unspecified: Secondary | ICD-10-CM | POA: Diagnosis not present

## 2018-02-10 DIAGNOSIS — I110 Hypertensive heart disease with heart failure: Secondary | ICD-10-CM | POA: Diagnosis not present

## 2018-02-10 DIAGNOSIS — I69398 Other sequelae of cerebral infarction: Secondary | ICD-10-CM | POA: Diagnosis not present

## 2018-02-11 DIAGNOSIS — F0390 Unspecified dementia without behavioral disturbance: Secondary | ICD-10-CM | POA: Diagnosis not present

## 2018-02-11 DIAGNOSIS — I69398 Other sequelae of cerebral infarction: Secondary | ICD-10-CM | POA: Diagnosis not present

## 2018-02-11 DIAGNOSIS — Z7901 Long term (current) use of anticoagulants: Secondary | ICD-10-CM | POA: Diagnosis not present

## 2018-02-11 DIAGNOSIS — I509 Heart failure, unspecified: Secondary | ICD-10-CM | POA: Diagnosis not present

## 2018-02-11 DIAGNOSIS — I4891 Unspecified atrial fibrillation: Secondary | ICD-10-CM | POA: Diagnosis not present

## 2018-02-11 DIAGNOSIS — I110 Hypertensive heart disease with heart failure: Secondary | ICD-10-CM | POA: Diagnosis not present

## 2018-02-14 DIAGNOSIS — I110 Hypertensive heart disease with heart failure: Secondary | ICD-10-CM | POA: Diagnosis not present

## 2018-02-14 DIAGNOSIS — I69398 Other sequelae of cerebral infarction: Secondary | ICD-10-CM | POA: Diagnosis not present

## 2018-02-14 DIAGNOSIS — I509 Heart failure, unspecified: Secondary | ICD-10-CM | POA: Diagnosis not present

## 2018-02-14 DIAGNOSIS — I4891 Unspecified atrial fibrillation: Secondary | ICD-10-CM | POA: Diagnosis not present

## 2018-02-14 DIAGNOSIS — Z681 Body mass index (BMI) 19 or less, adult: Secondary | ICD-10-CM | POA: Diagnosis not present

## 2018-02-14 DIAGNOSIS — F0390 Unspecified dementia without behavioral disturbance: Secondary | ICD-10-CM | POA: Diagnosis not present

## 2018-02-14 DIAGNOSIS — J189 Pneumonia, unspecified organism: Secondary | ICD-10-CM | POA: Diagnosis not present

## 2018-02-14 DIAGNOSIS — Z7901 Long term (current) use of anticoagulants: Secondary | ICD-10-CM | POA: Diagnosis not present

## 2018-02-14 DIAGNOSIS — D649 Anemia, unspecified: Secondary | ICD-10-CM | POA: Diagnosis not present

## 2018-02-14 DIAGNOSIS — K922 Gastrointestinal hemorrhage, unspecified: Secondary | ICD-10-CM | POA: Diagnosis not present

## 2018-02-15 DIAGNOSIS — I110 Hypertensive heart disease with heart failure: Secondary | ICD-10-CM | POA: Diagnosis not present

## 2018-02-15 DIAGNOSIS — I4891 Unspecified atrial fibrillation: Secondary | ICD-10-CM | POA: Diagnosis not present

## 2018-02-15 DIAGNOSIS — I69398 Other sequelae of cerebral infarction: Secondary | ICD-10-CM | POA: Diagnosis not present

## 2018-02-15 DIAGNOSIS — F0390 Unspecified dementia without behavioral disturbance: Secondary | ICD-10-CM | POA: Diagnosis not present

## 2018-02-15 DIAGNOSIS — Z7901 Long term (current) use of anticoagulants: Secondary | ICD-10-CM | POA: Diagnosis not present

## 2018-02-15 DIAGNOSIS — I509 Heart failure, unspecified: Secondary | ICD-10-CM | POA: Diagnosis not present

## 2018-02-16 DIAGNOSIS — I110 Hypertensive heart disease with heart failure: Secondary | ICD-10-CM | POA: Diagnosis not present

## 2018-02-16 DIAGNOSIS — I509 Heart failure, unspecified: Secondary | ICD-10-CM | POA: Diagnosis not present

## 2018-02-16 DIAGNOSIS — I4891 Unspecified atrial fibrillation: Secondary | ICD-10-CM | POA: Diagnosis not present

## 2018-02-16 DIAGNOSIS — Z7901 Long term (current) use of anticoagulants: Secondary | ICD-10-CM | POA: Diagnosis not present

## 2018-02-16 DIAGNOSIS — F0390 Unspecified dementia without behavioral disturbance: Secondary | ICD-10-CM | POA: Diagnosis not present

## 2018-02-16 DIAGNOSIS — I69398 Other sequelae of cerebral infarction: Secondary | ICD-10-CM | POA: Diagnosis not present

## 2018-02-17 DIAGNOSIS — I4891 Unspecified atrial fibrillation: Secondary | ICD-10-CM | POA: Diagnosis not present

## 2018-02-17 DIAGNOSIS — F0390 Unspecified dementia without behavioral disturbance: Secondary | ICD-10-CM | POA: Diagnosis not present

## 2018-02-17 DIAGNOSIS — I110 Hypertensive heart disease with heart failure: Secondary | ICD-10-CM | POA: Diagnosis not present

## 2018-02-17 DIAGNOSIS — Z7901 Long term (current) use of anticoagulants: Secondary | ICD-10-CM | POA: Diagnosis not present

## 2018-02-17 DIAGNOSIS — I509 Heart failure, unspecified: Secondary | ICD-10-CM | POA: Diagnosis not present

## 2018-02-17 DIAGNOSIS — I69398 Other sequelae of cerebral infarction: Secondary | ICD-10-CM | POA: Diagnosis not present

## 2018-02-18 DIAGNOSIS — I4891 Unspecified atrial fibrillation: Secondary | ICD-10-CM | POA: Diagnosis not present

## 2018-02-18 DIAGNOSIS — F0390 Unspecified dementia without behavioral disturbance: Secondary | ICD-10-CM | POA: Diagnosis not present

## 2018-02-18 DIAGNOSIS — I509 Heart failure, unspecified: Secondary | ICD-10-CM | POA: Diagnosis not present

## 2018-02-18 DIAGNOSIS — Z7901 Long term (current) use of anticoagulants: Secondary | ICD-10-CM | POA: Diagnosis not present

## 2018-02-18 DIAGNOSIS — I110 Hypertensive heart disease with heart failure: Secondary | ICD-10-CM | POA: Diagnosis not present

## 2018-02-18 DIAGNOSIS — I69398 Other sequelae of cerebral infarction: Secondary | ICD-10-CM | POA: Diagnosis not present

## 2018-02-21 DIAGNOSIS — I110 Hypertensive heart disease with heart failure: Secondary | ICD-10-CM | POA: Diagnosis not present

## 2018-02-21 DIAGNOSIS — Z7901 Long term (current) use of anticoagulants: Secondary | ICD-10-CM | POA: Diagnosis not present

## 2018-02-21 DIAGNOSIS — I69398 Other sequelae of cerebral infarction: Secondary | ICD-10-CM | POA: Diagnosis not present

## 2018-02-21 DIAGNOSIS — I509 Heart failure, unspecified: Secondary | ICD-10-CM | POA: Diagnosis not present

## 2018-02-21 DIAGNOSIS — I4891 Unspecified atrial fibrillation: Secondary | ICD-10-CM | POA: Diagnosis not present

## 2018-02-21 DIAGNOSIS — F0390 Unspecified dementia without behavioral disturbance: Secondary | ICD-10-CM | POA: Diagnosis not present

## 2018-02-22 DIAGNOSIS — J189 Pneumonia, unspecified organism: Secondary | ICD-10-CM | POA: Diagnosis not present

## 2018-02-22 DIAGNOSIS — F0390 Unspecified dementia without behavioral disturbance: Secondary | ICD-10-CM | POA: Diagnosis not present

## 2018-02-22 DIAGNOSIS — R6889 Other general symptoms and signs: Secondary | ICD-10-CM | POA: Diagnosis not present

## 2018-02-23 DIAGNOSIS — I4891 Unspecified atrial fibrillation: Secondary | ICD-10-CM | POA: Diagnosis not present

## 2018-02-23 DIAGNOSIS — I69398 Other sequelae of cerebral infarction: Secondary | ICD-10-CM | POA: Diagnosis not present

## 2018-02-23 DIAGNOSIS — Z7901 Long term (current) use of anticoagulants: Secondary | ICD-10-CM | POA: Diagnosis not present

## 2018-02-23 DIAGNOSIS — I110 Hypertensive heart disease with heart failure: Secondary | ICD-10-CM | POA: Diagnosis not present

## 2018-02-23 DIAGNOSIS — F0390 Unspecified dementia without behavioral disturbance: Secondary | ICD-10-CM | POA: Diagnosis not present

## 2018-02-23 DIAGNOSIS — I509 Heart failure, unspecified: Secondary | ICD-10-CM | POA: Diagnosis not present

## 2018-02-28 DIAGNOSIS — Z7901 Long term (current) use of anticoagulants: Secondary | ICD-10-CM | POA: Diagnosis not present

## 2018-02-28 DIAGNOSIS — J449 Chronic obstructive pulmonary disease, unspecified: Secondary | ICD-10-CM | POA: Diagnosis not present

## 2018-02-28 DIAGNOSIS — D649 Anemia, unspecified: Secondary | ICD-10-CM | POA: Diagnosis not present

## 2018-02-28 DIAGNOSIS — I639 Cerebral infarction, unspecified: Secondary | ICD-10-CM | POA: Diagnosis not present

## 2018-02-28 DIAGNOSIS — Z681 Body mass index (BMI) 19 or less, adult: Secondary | ICD-10-CM | POA: Diagnosis not present

## 2018-03-01 DIAGNOSIS — I4891 Unspecified atrial fibrillation: Secondary | ICD-10-CM | POA: Diagnosis not present

## 2018-03-01 DIAGNOSIS — I509 Heart failure, unspecified: Secondary | ICD-10-CM | POA: Diagnosis not present

## 2018-03-01 DIAGNOSIS — I110 Hypertensive heart disease with heart failure: Secondary | ICD-10-CM | POA: Diagnosis not present

## 2018-03-01 DIAGNOSIS — Z7901 Long term (current) use of anticoagulants: Secondary | ICD-10-CM | POA: Diagnosis not present

## 2018-03-01 DIAGNOSIS — F0390 Unspecified dementia without behavioral disturbance: Secondary | ICD-10-CM | POA: Diagnosis not present

## 2018-03-01 DIAGNOSIS — I69398 Other sequelae of cerebral infarction: Secondary | ICD-10-CM | POA: Diagnosis not present

## 2018-03-02 DIAGNOSIS — I110 Hypertensive heart disease with heart failure: Secondary | ICD-10-CM | POA: Diagnosis not present

## 2018-03-02 DIAGNOSIS — I4891 Unspecified atrial fibrillation: Secondary | ICD-10-CM | POA: Diagnosis not present

## 2018-03-02 DIAGNOSIS — I69398 Other sequelae of cerebral infarction: Secondary | ICD-10-CM | POA: Diagnosis not present

## 2018-03-02 DIAGNOSIS — I509 Heart failure, unspecified: Secondary | ICD-10-CM | POA: Diagnosis not present

## 2018-03-02 DIAGNOSIS — Z7901 Long term (current) use of anticoagulants: Secondary | ICD-10-CM | POA: Diagnosis not present

## 2018-03-02 DIAGNOSIS — F0390 Unspecified dementia without behavioral disturbance: Secondary | ICD-10-CM | POA: Diagnosis not present

## 2018-03-03 DIAGNOSIS — L918 Other hypertrophic disorders of the skin: Secondary | ICD-10-CM | POA: Diagnosis not present

## 2018-03-03 DIAGNOSIS — Z681 Body mass index (BMI) 19 or less, adult: Secondary | ICD-10-CM | POA: Diagnosis not present

## 2018-03-03 DIAGNOSIS — J449 Chronic obstructive pulmonary disease, unspecified: Secondary | ICD-10-CM | POA: Diagnosis not present

## 2018-03-03 DIAGNOSIS — D649 Anemia, unspecified: Secondary | ICD-10-CM | POA: Diagnosis not present

## 2018-03-03 DIAGNOSIS — I10 Essential (primary) hypertension: Secondary | ICD-10-CM | POA: Diagnosis not present

## 2018-03-03 DIAGNOSIS — I509 Heart failure, unspecified: Secondary | ICD-10-CM | POA: Diagnosis not present

## 2018-03-04 DIAGNOSIS — I509 Heart failure, unspecified: Secondary | ICD-10-CM | POA: Diagnosis not present

## 2018-03-04 DIAGNOSIS — F0390 Unspecified dementia without behavioral disturbance: Secondary | ICD-10-CM | POA: Diagnosis not present

## 2018-03-04 DIAGNOSIS — Z7901 Long term (current) use of anticoagulants: Secondary | ICD-10-CM | POA: Diagnosis not present

## 2018-03-04 DIAGNOSIS — I4891 Unspecified atrial fibrillation: Secondary | ICD-10-CM | POA: Diagnosis not present

## 2018-03-04 DIAGNOSIS — I69398 Other sequelae of cerebral infarction: Secondary | ICD-10-CM | POA: Diagnosis not present

## 2018-03-04 DIAGNOSIS — I110 Hypertensive heart disease with heart failure: Secondary | ICD-10-CM | POA: Diagnosis not present

## 2018-03-07 ENCOUNTER — Other Ambulatory Visit: Payer: Self-pay | Admitting: Family Medicine

## 2018-03-08 ENCOUNTER — Other Ambulatory Visit: Payer: Self-pay | Admitting: Family Medicine

## 2018-03-08 DIAGNOSIS — I110 Hypertensive heart disease with heart failure: Secondary | ICD-10-CM | POA: Diagnosis not present

## 2018-03-08 DIAGNOSIS — I4891 Unspecified atrial fibrillation: Secondary | ICD-10-CM | POA: Diagnosis not present

## 2018-03-08 DIAGNOSIS — I69398 Other sequelae of cerebral infarction: Secondary | ICD-10-CM | POA: Diagnosis not present

## 2018-03-08 DIAGNOSIS — F0390 Unspecified dementia without behavioral disturbance: Secondary | ICD-10-CM | POA: Diagnosis not present

## 2018-03-08 DIAGNOSIS — I509 Heart failure, unspecified: Secondary | ICD-10-CM | POA: Diagnosis not present

## 2018-03-08 DIAGNOSIS — Z7901 Long term (current) use of anticoagulants: Secondary | ICD-10-CM | POA: Diagnosis not present

## 2018-03-09 ENCOUNTER — Other Ambulatory Visit: Payer: Self-pay | Admitting: Family Medicine

## 2018-03-09 DIAGNOSIS — F0390 Unspecified dementia without behavioral disturbance: Secondary | ICD-10-CM | POA: Diagnosis not present

## 2018-03-09 DIAGNOSIS — I69398 Other sequelae of cerebral infarction: Secondary | ICD-10-CM | POA: Diagnosis not present

## 2018-03-09 DIAGNOSIS — M898X5 Other specified disorders of bone, thigh: Secondary | ICD-10-CM

## 2018-03-09 DIAGNOSIS — I4891 Unspecified atrial fibrillation: Secondary | ICD-10-CM | POA: Diagnosis not present

## 2018-03-09 DIAGNOSIS — Z7901 Long term (current) use of anticoagulants: Secondary | ICD-10-CM | POA: Diagnosis not present

## 2018-03-09 DIAGNOSIS — I110 Hypertensive heart disease with heart failure: Secondary | ICD-10-CM | POA: Diagnosis not present

## 2018-03-09 DIAGNOSIS — I509 Heart failure, unspecified: Secondary | ICD-10-CM | POA: Diagnosis not present

## 2018-03-10 DIAGNOSIS — I4891 Unspecified atrial fibrillation: Secondary | ICD-10-CM | POA: Diagnosis not present

## 2018-03-10 DIAGNOSIS — I509 Heart failure, unspecified: Secondary | ICD-10-CM | POA: Diagnosis not present

## 2018-03-10 DIAGNOSIS — Z7901 Long term (current) use of anticoagulants: Secondary | ICD-10-CM | POA: Diagnosis not present

## 2018-03-10 DIAGNOSIS — I110 Hypertensive heart disease with heart failure: Secondary | ICD-10-CM | POA: Diagnosis not present

## 2018-03-10 DIAGNOSIS — F0390 Unspecified dementia without behavioral disturbance: Secondary | ICD-10-CM | POA: Diagnosis not present

## 2018-03-10 DIAGNOSIS — I69398 Other sequelae of cerebral infarction: Secondary | ICD-10-CM | POA: Diagnosis not present

## 2018-03-14 DIAGNOSIS — F0390 Unspecified dementia without behavioral disturbance: Secondary | ICD-10-CM | POA: Diagnosis not present

## 2018-03-14 DIAGNOSIS — I69398 Other sequelae of cerebral infarction: Secondary | ICD-10-CM | POA: Diagnosis not present

## 2018-03-14 DIAGNOSIS — Z7901 Long term (current) use of anticoagulants: Secondary | ICD-10-CM | POA: Diagnosis not present

## 2018-03-14 DIAGNOSIS — I4891 Unspecified atrial fibrillation: Secondary | ICD-10-CM | POA: Diagnosis not present

## 2018-03-14 DIAGNOSIS — I110 Hypertensive heart disease with heart failure: Secondary | ICD-10-CM | POA: Diagnosis not present

## 2018-03-14 DIAGNOSIS — I509 Heart failure, unspecified: Secondary | ICD-10-CM | POA: Diagnosis not present

## 2018-03-16 DIAGNOSIS — Z681 Body mass index (BMI) 19 or less, adult: Secondary | ICD-10-CM | POA: Diagnosis not present

## 2018-03-16 DIAGNOSIS — R6889 Other general symptoms and signs: Secondary | ICD-10-CM | POA: Diagnosis not present

## 2018-03-16 DIAGNOSIS — F0390 Unspecified dementia without behavioral disturbance: Secondary | ICD-10-CM | POA: Diagnosis not present

## 2018-03-16 DIAGNOSIS — L570 Actinic keratosis: Secondary | ICD-10-CM | POA: Diagnosis not present

## 2018-03-18 DIAGNOSIS — I110 Hypertensive heart disease with heart failure: Secondary | ICD-10-CM | POA: Diagnosis not present

## 2018-03-18 DIAGNOSIS — Z7901 Long term (current) use of anticoagulants: Secondary | ICD-10-CM | POA: Diagnosis not present

## 2018-03-18 DIAGNOSIS — I509 Heart failure, unspecified: Secondary | ICD-10-CM | POA: Diagnosis not present

## 2018-03-18 DIAGNOSIS — I4891 Unspecified atrial fibrillation: Secondary | ICD-10-CM | POA: Diagnosis not present

## 2018-03-18 DIAGNOSIS — F0390 Unspecified dementia without behavioral disturbance: Secondary | ICD-10-CM | POA: Diagnosis not present

## 2018-03-18 DIAGNOSIS — I69398 Other sequelae of cerebral infarction: Secondary | ICD-10-CM | POA: Diagnosis not present

## 2018-03-24 DIAGNOSIS — I4891 Unspecified atrial fibrillation: Secondary | ICD-10-CM | POA: Diagnosis not present

## 2018-03-24 DIAGNOSIS — Z7901 Long term (current) use of anticoagulants: Secondary | ICD-10-CM | POA: Diagnosis not present

## 2018-03-24 DIAGNOSIS — I509 Heart failure, unspecified: Secondary | ICD-10-CM | POA: Diagnosis not present

## 2018-03-24 DIAGNOSIS — F0390 Unspecified dementia without behavioral disturbance: Secondary | ICD-10-CM | POA: Diagnosis not present

## 2018-03-24 DIAGNOSIS — I110 Hypertensive heart disease with heart failure: Secondary | ICD-10-CM | POA: Diagnosis not present

## 2018-03-24 DIAGNOSIS — I69398 Other sequelae of cerebral infarction: Secondary | ICD-10-CM | POA: Diagnosis not present

## 2018-04-07 DIAGNOSIS — Z6820 Body mass index (BMI) 20.0-20.9, adult: Secondary | ICD-10-CM | POA: Diagnosis not present

## 2018-04-07 DIAGNOSIS — Z0000 Encounter for general adult medical examination without abnormal findings: Secondary | ICD-10-CM | POA: Diagnosis not present

## 2018-04-07 DIAGNOSIS — Z1211 Encounter for screening for malignant neoplasm of colon: Secondary | ICD-10-CM | POA: Diagnosis not present

## 2018-05-03 ENCOUNTER — Other Ambulatory Visit: Payer: Self-pay | Admitting: Gastroenterology

## 2018-05-03 DIAGNOSIS — K625 Hemorrhage of anus and rectum: Secondary | ICD-10-CM | POA: Diagnosis not present

## 2018-05-27 DIAGNOSIS — Z681 Body mass index (BMI) 19 or less, adult: Secondary | ICD-10-CM | POA: Diagnosis not present

## 2018-05-27 DIAGNOSIS — I4891 Unspecified atrial fibrillation: Secondary | ICD-10-CM | POA: Diagnosis not present

## 2018-05-27 DIAGNOSIS — Z01818 Encounter for other preprocedural examination: Secondary | ICD-10-CM | POA: Diagnosis not present

## 2018-05-27 DIAGNOSIS — I428 Other cardiomyopathies: Secondary | ICD-10-CM | POA: Diagnosis not present

## 2018-05-27 DIAGNOSIS — I639 Cerebral infarction, unspecified: Secondary | ICD-10-CM | POA: Diagnosis not present

## 2018-05-31 ENCOUNTER — Encounter (HOSPITAL_COMMUNITY): Payer: Self-pay | Admitting: *Deleted

## 2018-06-01 ENCOUNTER — Other Ambulatory Visit: Payer: Self-pay

## 2018-06-01 ENCOUNTER — Ambulatory Visit (HOSPITAL_COMMUNITY)
Admission: RE | Admit: 2018-06-01 | Discharge: 2018-06-01 | Disposition: A | Discharge status: Home / Self Care | Payer: Medicare Other | Attending: Gastroenterology | Admitting: Gastroenterology

## 2018-06-01 ENCOUNTER — Ambulatory Visit (HOSPITAL_COMMUNITY): Payer: Medicare Other | Admitting: Anesthesiology

## 2018-06-01 ENCOUNTER — Encounter (HOSPITAL_COMMUNITY): Payer: Self-pay | Admitting: *Deleted

## 2018-06-01 ENCOUNTER — Encounter (HOSPITAL_COMMUNITY)
Admission: RE | Disposition: A | Discharge status: Home / Self Care | Payer: Self-pay | Source: Home / Self Care | Attending: Gastroenterology

## 2018-06-01 DIAGNOSIS — D120 Benign neoplasm of cecum: Secondary | ICD-10-CM | POA: Diagnosis not present

## 2018-06-01 DIAGNOSIS — Z7901 Long term (current) use of anticoagulants: Secondary | ICD-10-CM | POA: Diagnosis not present

## 2018-06-01 DIAGNOSIS — J449 Chronic obstructive pulmonary disease, unspecified: Secondary | ICD-10-CM | POA: Diagnosis not present

## 2018-06-01 DIAGNOSIS — K635 Polyp of colon: Secondary | ICD-10-CM | POA: Insufficient documentation

## 2018-06-01 DIAGNOSIS — Z79899 Other long term (current) drug therapy: Secondary | ICD-10-CM | POA: Insufficient documentation

## 2018-06-01 DIAGNOSIS — M1990 Unspecified osteoarthritis, unspecified site: Secondary | ICD-10-CM | POA: Insufficient documentation

## 2018-06-01 DIAGNOSIS — Z87891 Personal history of nicotine dependence: Secondary | ICD-10-CM | POA: Insufficient documentation

## 2018-06-01 DIAGNOSIS — K5730 Diverticulosis of large intestine without perforation or abscess without bleeding: Secondary | ICD-10-CM | POA: Insufficient documentation

## 2018-06-01 DIAGNOSIS — K921 Melena: Secondary | ICD-10-CM | POA: Insufficient documentation

## 2018-06-01 DIAGNOSIS — F0390 Unspecified dementia without behavioral disturbance: Secondary | ICD-10-CM | POA: Diagnosis not present

## 2018-06-01 DIAGNOSIS — D123 Benign neoplasm of transverse colon: Secondary | ICD-10-CM | POA: Insufficient documentation

## 2018-06-01 DIAGNOSIS — I110 Hypertensive heart disease with heart failure: Secondary | ICD-10-CM | POA: Diagnosis not present

## 2018-06-01 DIAGNOSIS — E7800 Pure hypercholesterolemia, unspecified: Secondary | ICD-10-CM | POA: Insufficient documentation

## 2018-06-01 DIAGNOSIS — I509 Heart failure, unspecified: Secondary | ICD-10-CM | POA: Diagnosis not present

## 2018-06-01 DIAGNOSIS — I4891 Unspecified atrial fibrillation: Secondary | ICD-10-CM | POA: Diagnosis not present

## 2018-06-01 DIAGNOSIS — D128 Benign neoplasm of rectum: Secondary | ICD-10-CM | POA: Insufficient documentation

## 2018-06-01 DIAGNOSIS — D124 Benign neoplasm of descending colon: Secondary | ICD-10-CM | POA: Diagnosis not present

## 2018-06-01 DIAGNOSIS — D122 Benign neoplasm of ascending colon: Secondary | ICD-10-CM | POA: Diagnosis not present

## 2018-06-01 DIAGNOSIS — D490 Neoplasm of unspecified behavior of digestive system: Secondary | ICD-10-CM | POA: Diagnosis not present

## 2018-06-01 DIAGNOSIS — Z8673 Personal history of transient ischemic attack (TIA), and cerebral infarction without residual deficits: Secondary | ICD-10-CM | POA: Diagnosis not present

## 2018-06-01 HISTORY — DX: Nausea with vomiting, unspecified: R11.2

## 2018-06-01 HISTORY — PX: POLYPECTOMY: SHX5525

## 2018-06-01 HISTORY — DX: Cerebral infarction, unspecified: I63.9

## 2018-06-01 HISTORY — DX: Other specified postprocedural states: Z98.890

## 2018-06-01 HISTORY — PX: BIOPSY: SHX5522

## 2018-06-01 HISTORY — DX: Chronic obstructive pulmonary disease, unspecified: J44.9

## 2018-06-01 HISTORY — PX: COLONOSCOPY: SHX5424

## 2018-06-01 HISTORY — DX: Respiratory tuberculosis unspecified: A15.9

## 2018-06-01 SURGERY — COLONOSCOPY
Anesthesia: Monitor Anesthesia Care

## 2018-06-01 MED ORDER — LACTATED RINGERS IV SOLN
INTRAVENOUS | Status: DC
  Administered 2018-06-01: 1000 mL via INTRAVENOUS

## 2018-06-01 MED ORDER — PROPOFOL 10 MG/ML IV BOLUS
INTRAVENOUS | Status: AC
  Filled 2018-06-01: qty 20

## 2018-06-01 MED ORDER — ONDANSETRON HCL 4 MG/2ML IJ SOLN
INTRAMUSCULAR | Status: DC | PRN
  Administered 2018-06-01: 4 mg via INTRAVENOUS

## 2018-06-01 MED ORDER — PROPOFOL 500 MG/50ML IV EMUL
INTRAVENOUS | Status: DC | PRN
  Administered 2018-06-01: 200 ug/kg/min via INTRAVENOUS

## 2018-06-01 MED ORDER — SODIUM CHLORIDE 0.9 % IV SOLN: INTRAVENOUS | Status: DC

## 2018-06-01 MED ORDER — PROPOFOL 10 MG/ML IV BOLUS
INTRAVENOUS | Status: AC
  Filled 2018-06-01: qty 40

## 2018-06-01 NOTE — Op Note (Signed)
[facility] Patient Name: Megan Munoz Procedure Date: 06/01/2018 MRN: [id number] Attending MD: Ronnette Juniper , MD Date of Birth: January 13, 1933 CSN: [id number] Age: 85 Admit Type: Outpatient Procedure:                Colonoscopy Indications:              This is the patient's first colonoscopy,                            Hematochezia Providers:                Ronnette Juniper, MD, Cleda Daub, RN, Cherylynn Ridges,                            Technician, Enrigue Catena, CRNA Referring MD:              Medicines:                Monitored Anesthesia Care Complications:            No immediate complications. Estimated blood loss:                            Minimal. Estimated Blood Loss:     Estimated blood loss was minimal. Procedure:                Pre-Anesthesia Assessment:                           - Prior to the procedure, a History and Physical                            was performed, and patient medications and                            allergies were reviewed. The patient's tolerance of                            previous anesthesia was also reviewed. The risks                            and benefits of the procedure and the sedation                            options and risks were discussed with the patient.                            All questions were answered, and informed consent                            was obtained. Prior Anticoagulants: The patient has                            taken Xarelto (rivaroxaban), last dose was 3 days                            prior to procedure. ASA  Grade Assessment: III - A                            patient with severe systemic disease. After                            reviewing the risks and benefits, the patient was                            deemed in satisfactory condition to undergo the                            procedure.                           After obtaining informed consent, the colonoscope                            was  passed under direct vision. Throughout the                            procedure, the patient's blood pressure, pulse, and                            oxygen saturations were monitored continuously. The                            PCF-PH190L ([id number] Olympus ultra slim endoscope                            was introduced through the anus and advanced to the                            the cecum, identified by appendiceal orifice and                            ileocecal valve. The colonoscopy was performed                            without difficulty. The patient tolerated the                            procedure well. The quality of the bowel                            preparation was adequate to identify polyps 6 mm                            and larger in size. Scope In: 2:39:43 PM Scope Out: 3:28:01 PM Scope Withdrawal Time: 0 hours 32 minutes 55 seconds  Total Procedure Duration: 0 hours 48 minutes 18 seconds  Findings:      The perianal exam findings include firm, nodular, thickened, raised area       felt at the anal verge.      A frond-like/villous,  fungating, infiltrative and sessile       non-obstructing medium-sized mass was found at the anus. The mass was       partially circumferential (involving one-third of the lumen       circumference). No bleeding was present. This was biopsied with a hot       snare for histology. The mass could not be removed endoscopically as it       appeared infiltrative, but piecemeal resection was attempted on all the       raised areas.      Two sessile polyps were found in the transverse colon. The polyps were 6       to 8 mm in size. These polyps were removed with a hot snare. Resection       and retrieval were complete.      A 7 mm polyp was found in the ascending colon. The polyp was sessile.       The polyp was removed with a hot snare. Resection and retrieval were       complete.      A 6 mm polyp was found in the cecum. The polyp was  sessile. The polyp       was removed with a hot snare. Resection and retrieval were complete.      A 4 mm polyp was found in the hepatic flexure. The polyp was sessile.       The polyp was removed with a cold biopsy forceps. Resection and       retrieval were complete.      A 9 mm polyp was found in the descending colon. The polyp was sessile.       The polyp was removed with a hot snare. Resection and retrieval were       complete.      A few small and large-mouthed diverticula were found in the sigmoid       colon and descending colon. Impression:               - Firm, nodular, thickened, raised area felt at the                            anal verge found on perianal exam.                           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 Biopsied.                           - Two 6 to 8 mm polyps in the transverse colon,                            removed with a hot snare. Resected and retrieved.                           - One 7 mm polyp in the ascending colon, removed                            with a hot snare. Resected and retrieved.                           - One 6  mm polyp in the cecum, removed with a hot                            snare. Resected and retrieved.                           - One 4 mm polyp at the hepatic flexure, removed                            with a cold biopsy forceps. Resected and retrieved.                           - One 9 mm polyp in the descending colon, removed                            with a hot snare. Resected and retrieved.                           - Diverticulosis in the sigmoid colon and in the                            descending colon. Moderate Sedation:      Patient did not receive moderate sedation for this procedure, but       instead received monitored anesthesia care. Recommendation:           - Patient has a contact number available for                            emergencies. The signs and symptoms of potential                             delayed complications were discussed with the                            patient. Return to normal activities tomorrow.                            Written discharge instructions were provided to the                            patient.                           - Resume regular diet.                           - Resume Xarelto (rivaroxaban) at prior dose in 3                            days.                           - Await pathology results.                           -  Depending on pathology, may need oncology                            evaluation. Procedure Code(s):        --- Professional ---                           [phone], Colonoscopy, flexible; with removal of                            tumor(s), polyp(s), or other lesion(s) by snare                            technique                           45380, 46, Colonoscopy, flexible; with biopsy,                            single or multiple Diagnosis Code(s):        --- Professional ---                           D49.0, Neoplasm of unspecified behavior of                            digestive system                           D12.3, Benign neoplasm of transverse colon (hepatic                            flexure or splenic flexure)                           D12.2, Benign neoplasm of ascending colon                           D12.0, Benign neoplasm of cecum                           D12.4, Benign neoplasm of descending colon                           K92.1, Melena (includes Hematochezia)                           K57.30, Diverticulosis of large intestine without                            perforation or abscess without bleeding CPT copyright 2018 American [employer] Association. All rights reserved. The codes documented in this report are preliminary and upon coder review may  be revised to meet current compliance requirements. Ronnette Juniper, MD 06/01/2018 3:42:18 PM This report has been signed electronically. Number of Addenda: 0

## 2018-06-01 NOTE — Anesthesia Postprocedure Evaluation (Signed)
Anesthesia Post Note  Patient: Megan Munoz  Procedure(s) Performed: COLONOSCOPY (N/A ) POLYPECTOMY BIOPSY     Patient location during evaluation: PACU Anesthesia Type: MAC Level of consciousness: awake and alert Pain management: pain level controlled Vital Signs Assessment: post-procedure vital signs reviewed and stable Respiratory status: spontaneous breathing, nonlabored ventilation, respiratory function stable and patient connected to nasal cannula oxygen Cardiovascular status: stable and blood pressure returned to baseline Postop Assessment: no apparent nausea or vomiting Anesthetic complications: no    Last Vitals:  Vitals:   06/01/18 1600 06/01/18 1615  BP: (!) 148/67 105/80  Pulse: 87 99  Resp: (!) 22 (!) 23  Temp:    SpO2: 100% 98%    Last Pain:  Vitals:   06/01/18 1615  TempSrc:   PainSc: 0-No pain                 Quint Chestnut

## 2018-06-01 NOTE — Anesthesia Procedure Notes (Addendum)
Procedure Name: MAC Date/Time: 06/01/2018 2:35 PM Performed by: Lissa Morales, CRNA Pre-anesthesia Checklist: Patient identified, Emergency Drugs available, Suction available, Patient being monitored and Timeout performed Patient Re-evaluated:Patient Re-evaluated prior to induction Oxygen Delivery Method: Simple face mask Placement Confirmation: positive ETCO2

## 2018-06-01 NOTE — Brief Op Note (Signed)
06/01/2018  3:30 PM  PATIENT:  Megan Munoz  85 y.o. female  PRE-OPERATIVE DIAGNOSIS:  Rectal bleeding  POST-OPERATIVE DIAGNOSIS:  * No post-op diagnosis entered *  PROCEDURE:  Procedure(s): COLONOSCOPY (N/A)  SURGEON:  Surgeon(s) and Role:    Ronnette Juniper, MD - Primary  PHYSICIAN ASSISTANT:   ASSISTANTS: Cleda Daub, RN, Janie Billups/Gary Marshell Levan , Tech  ANESTHESIA:   MAC  EBL:  5 ml  BLOOD ADMINISTERED:none  DRAINS: none   LOCAL MEDICATIONS USED:  NONE  SPECIMEN:  Biopsy / Limited Resection  DISPOSITION OF SPECIMEN:  PATHOLOGY  COUNTS:  YES  TOURNIQUET:  * No tourniquets in log *  DICTATION: .Dragon Dictation  PLAN OF CARE: Discharge to home after PACU  PATIENT DISPOSITION:  PACU - hemodynamically stable.   Delay start of Pharmacological VTE agent (>24hrs) due to surgical blood loss or risk of bleeding: yes

## 2018-06-01 NOTE — Anesthesia Postprocedure Evaluation (Signed)
Anesthesia Post Note  Patient: Megan Munoz  Procedure(s) Performed: COLONOSCOPY (N/A ) POLYPECTOMY BIOPSY     Anesthesia Type: MAC    Last Vitals:  Vitals:   06/01/18 1600 06/01/18 1615  BP: (!) 148/67 105/80  Pulse: 87 99  Resp: (!) 22 (!) 23  Temp:    SpO2: 100% 98%    Last Pain:  Vitals:   06/01/18 1615  TempSrc:   PainSc: 0-No pain                 Jeanise Durfey

## 2018-06-01 NOTE — H&P (Signed)
History of Present Illness  General:  85/female was seen in 5/19 for rectal bleeding and advised a colonoscopy. It was cancelled when she had resolution of rectal bleeding. She was seen as an inpatient in 11/19 when she presented with anemia and pnemonia, had an EGD which was unremarkable. Due to he respiratory status, she was not deemed stable to undergo colonoscopy at that time. She had a Ct abdomen and pelvis performed, which did not show a colonic pathology or diverticulosis but a lytic S3 lesion? benign. This was not investigated further. She was advised to restart xarelto 15 mg (lower dose than before). She has had few episodes of rectal bleeding. She states she has regular Bms(turds, formed). She has a good appetite and denies abdominal pain, nausea or vomiting. She denies acid reflux, difficulty swallowing or pain on swallowing.   Current Medications  Taking   Azelastine HCl 0.1 % Solution 1 puff in each nostril Nasally Twice a day   B complex 1 tab Oral   Calcitonin ([place]) 200 UNIT/ML Solution 0.5 ml Injection Once a day   Carvedilol 12.5 MG Tablet (Prior Auth: Rx Ref#:[id number]) Oral   Diltiazem HCl ER Coated Beads 120 MG Capsule Extended Release 24 Hour (Prior Auth: Rx Ref#:[id number]) Oral Three times daily   Donepezil HCl 10 MG Tablet 1 tablet at bedtime Orally Once a day   Fluoxetine HCl 40 MG Capsule (Prior Auth: Rx ZOX#:[id number]) Orally Once a day   Furosemide 20 MG Tablet 1 tablet Orally Once a day   Hydrocortisone Acetate 1 % Cream 1 application to affected area Externally Twice a day, Notes: PRN   Iron 325 (65 Fe) MG Tablet 1 tablet Orally Once a day   Loratadine 10 MG Tablet 1 tablet Orally Once a day   Memantine HCl 10 MG Tablet 1 tablet Orally Twice a day   Simvastatin 40 MG Tablet 1 tablet in the evening Orally Once a day   Vitamin D3 2000 UNIT Capsule 1 capsule Orally Once a day   Womens Multivitamin - Tablet as directed Orally   Xarelto 15 mg . Tablet 1  tablet orally once a day   Medication List reviewed and reconciled with the patient    Past Medical History  A-Fib .   CVA.   Hypertension .   High Cholestrol .   Arthritis .   CHF .   Hemorrhoids .   Dementia .   Anemia .    Surgical History  hysterectomy   bladder surgery-sling   hernia   carpal tunnel release-left   sinus surgery   right foot surgery   upper endoscopy    Family History  Father: deceased  Mother: deceased  No Family History of Colon Cancer, Polyps, or Liver Disease.   Social History  General:  no Alcohol.  Tobacco use  cigarettes: Former smoker Tobacco history last updated 05/03/2018 no Recreational drug use.    Allergies  Tetanus Immune Globulin: red/swelling - Allergy   Hospitalization/Major Diagnostic Procedure  Anemia and CHF 01-2018   Review of Systems  GI PROCEDURE:  no Pacemaker/ AICD, no. no Artificial heart valves. no MI/heart attack. Abnormal heart rhythm YES, Atrial Fibrillation. no Angina. CVA YES. Hypertension YES. no Hypotension. no Asthma, COPD. no Sleep apnea. no Seizure disorders. no Artificial joints. Severe DJD YES. no Diabetes. no Significant headaches. no Vertigo. no Depression/anxiety. Abnormal bleeding YES, Taking blood thinners. no Kidney Disease. no Liver disease, no. no Chance of pregnancy. no Blood transfusion.  Vital Signs  Wt 139, Wt change 9.5 lb, Ht 67, BMI 21.77, Temp 97.8, Pulse sitting 83, BP sitting 138/66.   Examination  Gastroenterology:: GENERAL APPEARANCE: Well developed, no active distress, pleasant, elderly, frail.  EYES: Lids and conjunctiva normal. Sclera normal.  ORAL CAVITY: Lips, teeth and gums are normal. Pharynx, tongue, mucosa normal .  SCLERA: anicteric .  CARDIOVASCULAR RRR no murmur, Normal RRR w/o murmers or gallops. No peripheral edema .  RESPIRATORY Breath sounds normal. Respiration even and unlabored .  ABDOMEN No masses palpated. Liver and spleen not palpated, normal.  Bowel sounds normal, Abdomen not distended .  EXTREMITIES: No edema, pulses intact .  NEURO: normal strength, normal gait .  PSYCH: mood/affect normal .     Assessments   1. Rectal bleeding - K62.5 (Primary)   Treatment  1. Rectal bleeding  IMAGING: Colonoscopy    Whitfield,Dia 05/03/2018 11:38:44 AM > called endo scheduling and spoke with Kendall-scheduled for 06/01/2018 at 2:15pm-[facility]-prep instructions reviewed with pt.   Notes: Will need to hold xarelto for at least 3 days prior to diagnostic colonoscopy. Advised to take benefiber 1 tablespoon in 8 oz of water a day, increase intake of fruits, vegetables and whole grains. Due to her comorbidities and gae, this needs to be done in the hospital as an outpatient. DDx: Hemorrhoids, AVms, colonic lesions. The risks and benefits of the procedure were discussed with the patient in details. She understands and verbalizes consent. She will be given written instructions, prescription for preparation and will be scheduled for the same.  Referral To: Reason:Colon-propofol-spoke with [phone]

## 2018-06-01 NOTE — Interval H&P Note (Signed)
History and Physical Interval Note: 85/female with hematochezia for a colonoscopy.  06/01/2018 1:28 PM  [name]  has presented today for colonsocopy, with the diagnosis of Rectal bleeding  The various methods of treatment have been discussed with the patient and family. After consideration of risks, benefits and other options for treatment, the patient has consented to  Procedure(s): COLONOSCOPY (N/A) as a surgical intervention .  The patient's history has been reviewed, patient examined, no change in status, stable for surgery.  I have reviewed the patient's chart and labs.  Questions were answered to the patient's satisfaction.     Ronnette Juniper

## 2018-06-01 NOTE — Discharge Instructions (Signed)

## 2018-06-01 NOTE — Anesthesia Preprocedure Evaluation (Signed)
Anesthesia Evaluation  Patient identified by MRN, date of birth, ID band Patient awake    Reviewed: Allergy & Precautions, H&P , NPO status , Patient's Chart, lab work & pertinent test results, reviewed documented beta blocker date and time   History of Anesthesia Complications (+) PONV  Airway Mallampati: II  TM Distance: >3 FB Neck ROM: full    Dental no notable dental hx.    Pulmonary COPD, former smoker,    Pulmonary exam normal breath sounds clear to auscultation       Cardiovascular Exercise Tolerance: Good hypertension, + CAD and +CHF   Rhythm:regular Rate:Normal     Neuro/Psych PSYCHIATRIC DISORDERS Anxiety Dementia CVA    GI/Hepatic negative GI ROS, Neg liver ROS,   Endo/Other  negative endocrine ROS  Renal/GU negative Renal ROS  negative genitourinary   Musculoskeletal   Abdominal   Peds  Hematology negative hematology ROS (+) Blood dyscrasia, anemia ,   Anesthesia Other Findings   Reproductive/Obstetrics negative OB ROS                             Anesthesia Physical  Anesthesia Plan  ASA: III  Anesthesia Plan: MAC   Post-op Pain Management:    Induction: Intravenous  PONV Risk Score and Plan:   Airway Management Planned: Mask and Natural Airway  Additional Equipment:   Intra-op Plan:   Post-operative Plan:   Informed Consent: I have reviewed the patients History and Physical, chart, labs and discussed the procedure including the risks, benefits and alternatives for the proposed anesthesia with the patient or authorized representative who has indicated his/her understanding and acceptance.     Dental Advisory Given  Plan Discussed with: Anesthesiologist, Surgeon and CRNA  Anesthesia Plan Comments:         Anesthesia Quick Evaluation

## 2018-06-01 NOTE — Transfer of Care (Signed)
Immediate Anesthesia Transfer of Care Note  Patient: Megan Munoz  Procedure(s) Performed: COLONOSCOPY (N/A ) POLYPECTOMY BIOPSY  Patient Location: PACU  Anesthesia Type:MAC  Level of Consciousness: awake and drowsy  Airway & Oxygen Therapy: Patient Spontanous Breathing and Patient connected to face mask oxygen  Post-op Assessment: Report given to RN, Post -op Vital signs reviewed and stable and Patient moving all extremities X 4  Post vital signs: stable  Last Vitals:  Vitals Value Taken Time  BP    Temp    Pulse    Resp    SpO2      Last Pain:  Vitals:   06/01/18 1322  PainSc: 0-No pain         Complications: No apparent anesthesia complications

## 2018-06-03 ENCOUNTER — Encounter (HOSPITAL_COMMUNITY): Payer: Self-pay | Admitting: Gastroenterology

## 2018-06-07 DIAGNOSIS — F411 Generalized anxiety disorder: Secondary | ICD-10-CM | POA: Diagnosis not present

## 2018-06-07 DIAGNOSIS — Z6820 Body mass index (BMI) 20.0-20.9, adult: Secondary | ICD-10-CM | POA: Diagnosis not present

## 2018-06-08 DIAGNOSIS — J069 Acute upper respiratory infection, unspecified: Secondary | ICD-10-CM | POA: Diagnosis not present

## 2018-06-09 DIAGNOSIS — J189 Pneumonia, unspecified organism: Secondary | ICD-10-CM | POA: Diagnosis not present

## 2018-06-09 DIAGNOSIS — J069 Acute upper respiratory infection, unspecified: Secondary | ICD-10-CM | POA: Diagnosis not present

## 2018-06-09 DIAGNOSIS — R0602 Shortness of breath: Secondary | ICD-10-CM | POA: Diagnosis not present

## 2018-06-13 ENCOUNTER — Emergency Department (HOSPITAL_COMMUNITY): Payer: Medicare Other

## 2018-06-13 ENCOUNTER — Other Ambulatory Visit: Payer: Self-pay

## 2018-06-13 ENCOUNTER — Emergency Department (HOSPITAL_COMMUNITY)
Admission: EM | Admit: 2018-06-13 | Discharge: 2018-06-13 | Disposition: A | Discharge status: Home / Self Care | Payer: Medicare Other | Attending: Emergency Medicine | Admitting: Emergency Medicine

## 2018-06-13 ENCOUNTER — Encounter (HOSPITAL_COMMUNITY): Payer: Self-pay | Admitting: Emergency Medicine

## 2018-06-13 DIAGNOSIS — I5032 Chronic diastolic (congestive) heart failure: Secondary | ICD-10-CM | POA: Insufficient documentation

## 2018-06-13 DIAGNOSIS — Z7901 Long term (current) use of anticoagulants: Secondary | ICD-10-CM | POA: Diagnosis not present

## 2018-06-13 DIAGNOSIS — Z8673 Personal history of transient ischemic attack (TIA), and cerebral infarction without residual deficits: Secondary | ICD-10-CM | POA: Diagnosis not present

## 2018-06-13 DIAGNOSIS — R05 Cough: Secondary | ICD-10-CM | POA: Diagnosis not present

## 2018-06-13 DIAGNOSIS — I2510 Atherosclerotic heart disease of native coronary artery without angina pectoris: Secondary | ICD-10-CM | POA: Diagnosis not present

## 2018-06-13 DIAGNOSIS — I4821 Permanent atrial fibrillation: Secondary | ICD-10-CM | POA: Insufficient documentation

## 2018-06-13 DIAGNOSIS — Z87891 Personal history of nicotine dependence: Secondary | ICD-10-CM | POA: Diagnosis not present

## 2018-06-13 DIAGNOSIS — E785 Hyperlipidemia, unspecified: Secondary | ICD-10-CM | POA: Insufficient documentation

## 2018-06-13 DIAGNOSIS — I252 Old myocardial infarction: Secondary | ICD-10-CM | POA: Diagnosis not present

## 2018-06-13 DIAGNOSIS — J441 Chronic obstructive pulmonary disease with (acute) exacerbation: Secondary | ICD-10-CM | POA: Insufficient documentation

## 2018-06-13 DIAGNOSIS — I110 Hypertensive heart disease with heart failure: Secondary | ICD-10-CM | POA: Insufficient documentation

## 2018-06-13 DIAGNOSIS — Z79899 Other long term (current) drug therapy: Secondary | ICD-10-CM | POA: Insufficient documentation

## 2018-06-13 DIAGNOSIS — F0390 Unspecified dementia without behavioral disturbance: Secondary | ICD-10-CM | POA: Insufficient documentation

## 2018-06-13 HISTORY — DX: Pneumonia, unspecified organism: J18.9

## 2018-06-13 LAB — COMPREHENSIVE METABOLIC PANEL
ALT: 27 U/L (ref 0–44)
AST: 34 U/L (ref 15–41)
Albumin: 3.5 g/dL (ref 3.5–5.0)
Alkaline Phosphatase: 76 U/L (ref 38–126)
Anion gap: 8 (ref 5–15)
BUN: 20 mg/dL (ref 8–23)
CO2: 25 mmol/L (ref 22–32)
CREATININE: 0.71 mg/dL (ref 0.44–1.00)
Calcium: 9.2 mg/dL (ref 8.9–10.3)
Chloride: 108 mmol/L (ref 98–111)
GFR calc Af Amer: >60 mL/min (ref >60)
GFR calc non Af Amer: >60 mL/min (ref >60)
Glucose, Bld: 132 mg/dL — ABNORMAL HIGH (ref 70–99)
Potassium: 5.1 mmol/L (ref 3.5–5.1)
Sodium: 141 mmol/L (ref 135–145)
Total Bilirubin: 0.3 mg/dL (ref 0.3–1.2)
Total Protein: 6.5 g/dL (ref 6.5–8.1)

## 2018-06-13 LAB — CBC WITH DIFFERENTIAL/PLATELET
ABS IMMATURE GRANULOCYTES: 0.04 10*3/uL (ref 0.00–0.07)
Basophils Absolute: 0 10*3/uL (ref 0.0–0.1)
Basophils Relative: 0 %
Eosinophils Absolute: 0 10*3/uL (ref 0.0–0.5)
Eosinophils Relative: 0 %
HEMATOCRIT: 40 % (ref 36.0–46.0)
Hemoglobin: 12.2 g/dL (ref 12.0–15.0)
Immature Granulocytes: 1 %
LYMPHS ABS: 1.5 10*3/uL (ref 0.7–4.0)
Lymphocytes Relative: 25 %
MCH: 29 pg (ref 26.0–34.0)
MCHC: 30.5 g/dL (ref 30.0–36.0)
MCV: 95 fL (ref 80.0–100.0)
Monocytes Absolute: 0.4 10*3/uL (ref 0.1–1.0)
Monocytes Relative: 6 %
Neutro Abs: 4.1 10*3/uL (ref 1.7–7.7)
Neutrophils Relative %: 68 %
Platelets: 234 10*3/uL (ref 150–400)
RBC: 4.21 MIL/uL (ref 3.87–5.11)
RDW: 14.2 % (ref 11.5–15.5)
WBC: 6 10*3/uL (ref 4.0–10.5)
nRBC: 0 % (ref 0.0–0.2)

## 2018-06-13 LAB — INFLUENZA PANEL BY PCR (TYPE A & B)
Influenza A By PCR: NEGATIVE
Influenza B By PCR: NEGATIVE

## 2018-06-13 MED ORDER — BENZONATATE 100 MG PO CAPS: 200.0000 mg | ORAL_CAPSULE | Freq: Three times a day (TID) | ORAL | 0 refills | Status: DC | PRN

## 2018-06-13 MED ORDER — ALBUTEROL SULFATE (2.5 MG/3ML) 0.083% IN NEBU: 2.5000 mg | INHALATION_SOLUTION | Freq: Four times a day (QID) | RESPIRATORY_TRACT | 1 refills | Status: DC | PRN

## 2018-06-13 MED ORDER — PREDNISONE 20 MG PO TABS: ORAL_TABLET | ORAL | 0 refills | Status: DC

## 2018-06-13 MED ORDER — LEVOFLOXACIN 500 MG PO TABS: 500.0000 mg | ORAL_TABLET | Freq: Every day | ORAL | 0 refills | Status: DC

## 2018-06-13 MED ORDER — IPRATROPIUM-ALBUTEROL 0.5-2.5 (3) MG/3ML IN SOLN
3.0000 mL | Freq: Once | RESPIRATORY_TRACT | Status: AC
  Administered 2018-06-13: 3 mL via RESPIRATORY_TRACT
  Filled 2018-06-13: qty 3

## 2018-06-13 MED ORDER — METHYLPREDNISOLONE SODIUM SUCC 125 MG IJ SOLR
125.0000 mg | Freq: Once | INTRAMUSCULAR | Status: AC
  Administered 2018-06-13: 125 mg via INTRAVENOUS
  Filled 2018-06-13: qty 2

## 2018-06-13 MED ORDER — ALBUTEROL SULFATE (2.5 MG/3ML) 0.083% IN NEBU
2.5000 mg | INHALATION_SOLUTION | Freq: Once | RESPIRATORY_TRACT | Status: AC
  Administered 2018-06-13: 2.5 mg via RESPIRATORY_TRACT
  Filled 2018-06-13: qty 3

## 2018-06-13 NOTE — ED Notes (Signed)
Discharge instructions and prescriptions discussed with Pt. Pt verbalized understanding. Pt stable and ambulatory.   

## 2018-06-13 NOTE — ED Triage Notes (Signed)
Daughter Megan Munoz has been sick since Thursday, went to Dr. On Friday and given an antibiotic and Phnergan with codeine and its not helping. She is no better even with the Phnergan.

## 2018-06-13 NOTE — ED Notes (Addendum)
Pt. Walked fairly quick pace with walker and didn't seem to have any further SOB, did not have any dizziness or trouble with gait. Pt. Maintained 95-97% consistently throughout ambulation.

## 2018-06-13 NOTE — ED Triage Notes (Signed)
Daughter stated,she tested Negative for Influenza A/B on Friday.

## 2018-06-13 NOTE — Discharge Instructions (Addendum)
And follow up with her doctor in 2-3 days for recheck

## 2018-06-13 NOTE — ED Provider Notes (Signed)
[facility] EMERGENCY DEPARTMENT Provider Note   CSN: [id number] Arrival date & time: 06/13/18  1629    History   Chief Complaint Chief Complaint  Patient presents with  . Cough  . Fever    HPI Megan Munoz is a 85 y.o. female.     Patient complains of cough congestion. She's been on a Z-Pak without improvement  The history is provided by the patient. No language interpreter was used.  Cough  Cough characteristics:  Productive Sputum characteristics:  Nondescript Severity:  Moderate Onset quality:  Sudden Timing:  Constant Progression:  Worsening Chronicity:  New Smoker: no   Context: not animal exposure   Associated symptoms: fever   Associated symptoms: no chest pain, no eye discharge, no headaches and no rash   Fever  Associated symptoms: cough   Associated symptoms: no chest pain, no congestion, no diarrhea, no headaches and no rash     Past Medical History:  Diagnosis Date  . Anxiety   . Atherosclerosis of aortic arch [facility]) April 2017   Noted on CT chest, calcified aortic atherosclerosis at the arch.  . Atrial fibrillation, permanent 03/2013   Previously on Atenolol for Rate - now on combination diltiazem and carvedilol for rate Control;  & Xarelto for AC. CHADS2VASC2 = 7.  . Cerebral infarction [facility]) 10/2008   MRI Brain 05/2014 for near syncope revealed: No acure infarct.  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   Mild small vessel disease type changes.  No intracranial hemorrhage.  Global atrophy.   . CHF (congestive heart failure) ([place])   . COPD (chronic obstructive pulmonary disease) ([place])   . Coronary atherosclerosis due to calcified coronary lesion April 2017   Noted on CT chest  . Hyperlipemia   . Hypertension   . Pneumonia   . PONV (postoperative nausea and vomiting)   . Stroke [facility])    2010- patient has dementia related to stroke   .  Tinea unguium   . Tremor   . Tuberculosis     Patient Active Problem List   Diagnosis Date Noted  . Pulmonary hypertension ([place]) 02/07/2018  . Acute on chronic diastolic heart failure ([place]) 02/07/2018  . Respiratory distress   . Encephalomalacia on imaging study 02/03/2018  . COPD (chronic obstructive pulmonary disease) with emphysema ([place]) 02/03/2018  . Gastrointestinal hemorrhage   . CAP (community acquired pneumonia) 02/02/2018  . Tremor 07/21/2017  . Insomnia 07/21/2017  . Dementia without behavioral disturbance ([place]) 07/21/2016  . Gait abnormality 07/21/2016  . Recurrent pneumonia 12/06/2015  . Multiple lung nodules on CT 11/01/2015  . Chronic diastolic CHF (congestive heart failure) ([place]) 10/30/2015  . Bilateral lower extremity edema 08/05/2015  . Lobar pneumonia due to unspecified organism 07/30/2015  . Malnutrition of moderate degree 07/29/2015  . Hyponatremia 07/25/2015  . Symptomatic anemia 07/25/2015  . Acute respiratory failure with hypoxia ([place]) 07/24/2015  . Essential hypertension 07/22/2014  . Permanent atrial fibrillation ([place]): CHA2DS2-VASc Score 7 - on Xarelto 07/07/2014  . Hyperlipidemia 07/07/2014  . H/O: stroke 10/29/2008    Past Surgical History:  Procedure Laterality Date  . BIOPSY  06/01/2018   Procedure: BIOPSY;  Surgeon: Ronnette Juniper, MD;  Location: [facility];  Service: Gastroenterology;;  . BLADDER SURGERY    . CARPAL TUNNEL RELEASE Left   . COLONOSCOPY N/A 06/01/2018   Procedure: COLONOSCOPY;  Surgeon: Ronnette Juniper, MD;  Location: [facility];  Service: Gastroenterology;  Laterality: N/A;  . ESOPHAGOGASTRODUODENOSCOPY (EGD) WITH PROPOFOL N/A 02/04/2018   Procedure: ESOPHAGOGASTRODUODENOSCOPY (EGD) WITH PROPOFOL;  Surgeon: Ronnette Juniper, MD;  Location: [place];  Service: Gastroenterology;  Laterality: N/A;  . FOOT SURGERY Right   . HERNIA REPAIR    . NASAL SINUS SURGERY    . POLYPECTOMY  06/01/2018   Procedure: POLYPECTOMY;  Surgeon: Ronnette Juniper, MD;  Location: [facility];  Service: Gastroenterology;;  . TOTAL ABDOMINAL HYSTERECTOMY       OB History   No obstetric history on file.      Home Medications    Prior to Admission medications   Medication Sig Start Date End Date Taking? Authorizing Provider  albuterol (PROVENTIL) (2.5 MG/3ML) 0.083% nebulizer solution Take 3 mLs (2.5 mg total) by nebulization every 6 (six) hours as needed for wheezing or shortness of breath. 06/13/18   Milton Ferguson, MD  Azelastine HCl 137 MCG/SPRAY SOLN Place 2 sprays into both nostrils 2 (two) times daily. Use in each nostril as directed     [provider]  B Complex Vitamins (VITAMIN B COMPLEX PO) Take 1 tablet by mouth daily.     [provider]  benzonatate (TESSALON) 100 MG capsule Take 2 capsules (200 mg total) by mouth 3 (three) times daily as needed for cough. 06/13/18   Milton Ferguson, MD  calcitonin, salmon, (MIACALCIN) 200 UNIT/ACT nasal spray Place 1 spray into alternate nostrils daily.    [provider]  carvedilol (COREG) 12.5 MG tablet TAKE 1 TABLET TWICE A DAY WITH MEALS (DUE FOR FOLLOW UP APPOINTMENT IN [place]) Patient not taking: No sig reported 12/01/17   Leonie Man, MD  Cholecalciferol (VITAMIN D3 SUPER STRENGTH) 2000 units CAPS Take 2,000 Units by mouth daily.     [provider]  diltiazem (CARDIZEM CD) 120 MG 24 hr capsule TAKE 3 CAPSULES DAILY (PLEASE SCHEDULE AN APPOINTMENT FOR FURTHER REFILLS) Patient taking differently: Take 360 mg by mouth daily.  12/20/17   Leonie Man, MD  docusate sodium (COLACE) 100 MG capsule Take 100-200 mg by mouth 2 (two) times daily as needed for mild constipation. Pt does take at least 100 mg daily    [provider]  donepezil (ARICEPT) 10 MG tablet Take 1 tablet (10 mg total) by mouth at bedtime. Patient not taking: Reported on 05/27/2018 07/21/17   Dennie Bible, NP  feeding supplement, ENSURE ENLIVE, (ENSURE ENLIVE) LIQD Take 237  mLs by mouth 3 (three) times daily between meals. Patient taking differently: Take 237 mLs by mouth See admin instructions. 2-3  Times daily 07/30/15   Dhungel, Nishant, MD  ferrous sulfate 325 (65 FE) MG tablet Take 1 tablet (325 mg total) by mouth every other day. 02/07/18   Wilber Oliphant, MD  FLUoxetine (PROZAC) 40 MG capsule Take 40 mg by mouth daily.     [provider]  Fluticasone-Umeclidin-Vilant (TRELEGY ELLIPTA) 100-62.5-25 MCG/INH AEPB Inhale 1 puff into the lungs daily.    [provider]  furosemide (LASIX) 20 MG tablet Take 1 tablet (20 mg total) by mouth daily as needed for fluid or edema. Patient taking differently: Take 20 mg by mouth every other day.  08/05/15   Leonie Man, MD  levofloxacin (LEVAQUIN) 500 MG tablet Take 1 tablet (500 mg total) by mouth daily. 06/13/18   Milton Ferguson, MD  loratadine (CLARITIN) 10 MG tablet Take 10 mg by mouth daily.    [provider]  Melatonin 10 MG  TABS Take 10 mg by mouth at bedtime.     [provider]  memantine (NAMENDA) 10 MG tablet Take 1 tablet (10 mg total) by mouth 2 (two) times daily. 07/21/17   Dennie Bible, NP  Multiple Vitamin (MULTIVITAMIN) capsule Take 1 capsule by mouth daily.    [provider]  predniSONE (DELTASONE) 20 MG tablet 2 tabs po daily x 3 days 06/13/18   Milton Ferguson, MD  rivaroxaban (XARELTO) 10 MG TABS tablet Take 10 mg by mouth daily.    [provider]  simvastatin (ZOCOR) 40 MG tablet Take 40 mg by mouth daily.    [provider]    Family History Family History  Problem Relation Age of Onset  . Osteoporosis Mother   . Emphysema Father   . Lung disease Father        Black Lung  . Stroke Brother   . Cancer Maternal Grandmother   . Emphysema Paternal Grandmother   . Asthma Daughter        allergy induced    Social History Social History   Tobacco Use  . Smoking status: Former Smoker    Packs/day: 1.00    Years: 16.00     Pack years: 16.00    Start date: 03/30/1954    Last attempt to quit: 03/30/1970    Years since quitting: 48.2  . Smokeless tobacco: Never Used  . Tobacco comment: smoked 1/2-1 ppd  Substance Use Topics  . Alcohol use: Not Currently    Alcohol/week: 0.0 standard drinks  . Drug use: No     Allergies   Tetanus toxoids   Review of Systems Review of Systems  Constitutional: Positive for fever. Negative for appetite change and fatigue.  HENT: Negative for congestion, ear discharge and sinus pressure.   Eyes: Negative for discharge.  Respiratory: Positive for cough.   Cardiovascular: Negative for chest pain.  Gastrointestinal: Negative for abdominal pain and diarrhea.  Genitourinary: Negative for frequency and hematuria.  Musculoskeletal: Negative for back pain.  Skin: Negative for rash.  Neurological: Negative for seizures and headaches.  Psychiatric/Behavioral: Negative for hallucinations.     Physical Exam Updated Vital Signs BP (!) 142/92   Pulse (!) 101   Temp 98 F (36.7 C) (Oral)   Resp 17   SpO2 95%   Physical Exam Vitals signs reviewed.  Constitutional:      Appearance: She is well-developed.  HENT:     Head: Normocephalic.     Nose: Nose normal.  Eyes:     General: No scleral icterus.    Conjunctiva/sclera: Conjunctivae normal.  Neck:     Musculoskeletal: Neck supple.     Thyroid: No thyromegaly.  Cardiovascular:     Rate and Rhythm: Normal rate and regular rhythm.     Heart sounds: No murmur. No friction rub. No gallop.   Pulmonary:     Breath sounds: No stridor. Wheezing present. No rales.  Chest:     Chest wall: No tenderness.  Abdominal:     General: There is no distension.     Tenderness: There is no abdominal tenderness. There is no rebound.  Musculoskeletal: Normal range of motion.  Lymphadenopathy:     Cervical: No cervical adenopathy.  Skin:    Findings: No erythema or rash.  Neurological:     Mental Status: She is alert and oriented to  person, place, and time.     Motor: No abnormal muscle tone.     Coordination: Coordination normal.  Psychiatric:        Behavior: Behavior normal.      ED Treatments / Results  Labs (all labs ordered are listed, but only abnormal results are displayed) Labs Reviewed  COMPREHENSIVE METABOLIC PANEL - Abnormal; Notable for the following components:      Result Value   Glucose, Bld 132 (*)    All other components within normal limits  CBC WITH DIFFERENTIAL/PLATELET  INFLUENZA PANEL BY PCR (TYPE A & B)    EKG None  Radiology Dg Chest 2 View  Result Date: 06/13/2018 CLINICAL DATA:  Chronic cough, worsening over the last 5 days. Low grade fever. History of tuberculosis and pneumonia. EXAM: CHEST - 2 VIEW COMPARISON:  Radiographs 02/03/2018 and 02/02/2018. FINDINGS: The heart size and mediastinal contours are stable with aortic atherosclerosis. The bilateral upper lobe infiltrates noted on the prior examination have resolved. There is mild upper lobe pleural-parenchymal scarring with hilar retraction. There is no new airspace disease, edema, pleural effusion or pneumothorax. Superior endplate compression deformity at L2 appears chronic. IMPRESSION: Interval resolution of previously demonstrated bilateral upper lobe infiltrates. No acute cardiopulmonary process. Electronically Signed   By: Richardean Sale M.D.   On: 06/13/2018 17:41    Procedures Procedures (including critical care time)  Medications Ordered in ED Medications  ipratropium-albuterol (DUONEB) 0.5-2.5 (3) MG/3ML nebulizer solution 3 mL (3 mLs Nebulization Given 06/13/18 1822)  albuterol (PROVENTIL) (2.5 MG/3ML) 0.083% nebulizer solution 2.5 mg (2.5 mg Nebulization Given 06/13/18 1822)  methylPREDNISolone sodium succinate (SOLU-MEDROL) 125 mg/2 mL injection 125 mg (125 mg Intravenous Given 06/13/18 1822)     Initial Impression / Assessment and Plan / ED Course  I have reviewed the triage vital signs and the nursing notes.   Pertinent labs & imaging results that were available during my care of the patient were reviewed by me and considered in my medical decision making (see chart for details).         And improved with the treatment. Patient ambulated without problems and her O2 sat never dropped below 92%. She'll be discharged home with Levaquin and Tessalon Perles, prednisone and albuterol.  Pt will follow up with pcp in 2-3 days Final Clinical Impressions(s) / ED Diagnoses   Final diagnoses:  COPD exacerbation [facility])    ED Discharge Orders         Ordered    levofloxacin (LEVAQUIN) 500 MG tablet  Daily     06/13/18 2200    predniSONE (DELTASONE) 20 MG tablet     06/13/18 2200    benzonatate (TESSALON) 100 MG capsule  3 times daily PRN     06/13/18 2200    albuterol (PROVENTIL) (2.5 MG/3ML) 0.083% nebulizer solution  Every 6 hours PRN     06/13/18 2200           Milton Ferguson, MD 06/13/18 2204

## 2018-06-16 DIAGNOSIS — J069 Acute upper respiratory infection, unspecified: Secondary | ICD-10-CM | POA: Diagnosis not present

## 2018-06-16 DIAGNOSIS — J189 Pneumonia, unspecified organism: Secondary | ICD-10-CM | POA: Diagnosis not present

## 2018-06-16 DIAGNOSIS — Z719 Counseling, unspecified: Secondary | ICD-10-CM | POA: Diagnosis not present

## 2018-07-21 ENCOUNTER — Telehealth: Payer: Self-pay

## 2018-07-21 NOTE — Telephone Encounter (Signed)
Unable to get in contact with the patient to convert their office visit into a webex visit. I left them a voicemail asking to return my call. Office number was provided.   

## 2018-07-25 NOTE — Telephone Encounter (Signed)
pt daughter(Kimberly) has given verbal consent to file insurance for "VV" on 07-26-2018 pt email address:[email or web address] pt asked to download [employer] webex app/program https://www.webex.com/downloads.html phone rep discussed the consent for video and phone visits, copay, the limitations to the physical exam, etc.

## 2018-07-25 NOTE — Telephone Encounter (Signed)
Patient has been added to [employer] calendar for 07/26/2018. E-mail has been sent.

## 2018-07-26 ENCOUNTER — Ambulatory Visit (INDEPENDENT_AMBULATORY_CARE_PROVIDER_SITE_OTHER): Payer: Medicare Other | Admitting: Neurology

## 2018-07-26 ENCOUNTER — Ambulatory Visit: Payer: Medicare Other | Admitting: Nurse Practitioner

## 2018-07-26 ENCOUNTER — Encounter: Payer: Self-pay | Admitting: Neurology

## 2018-07-26 ENCOUNTER — Other Ambulatory Visit: Payer: Self-pay

## 2018-07-26 DIAGNOSIS — F0390 Unspecified dementia without behavioral disturbance: Secondary | ICD-10-CM

## 2018-07-26 DIAGNOSIS — R269 Unspecified abnormalities of gait and mobility: Secondary | ICD-10-CM

## 2018-07-26 DIAGNOSIS — R251 Tremor, unspecified: Secondary | ICD-10-CM

## 2018-07-26 MED ORDER — DONEPEZIL HCL 10 MG PO TABS: 10.0000 mg | ORAL_TABLET | Freq: Every day | ORAL | 3 refills | Status: DC

## 2018-07-26 MED ORDER — MEMANTINE HCL 10 MG PO TABS: 10.0000 mg | ORAL_TABLET | Freq: Two times a day (BID) | ORAL | 3 refills | Status: DC

## 2018-07-26 NOTE — Progress Notes (Signed)
Virtual Visit via Video Note  I connected with Megan Munoz on 07/26/18 at 10:15 AM EDT by a video enabled telemedicine application and verified that I am speaking with the correct person using two identifiers.   I discussed the limitations of evaluation and management by telemedicine and the availability of in person appointments. The patient expressed understanding and agreed to proceed.  History of Present Illness: Megan Munoz a 85 yo RH , female, is accompanied by her daughter Megan Munoz, referred by her primary care physician Megan Munoz for continued neurological care for her history of stroke, essential tremor,  She had a history of hypertension, hyperlipidemia, diagnosis of atrial fibrillation since 2015, is taking Xarelto  She suffered left frontal, basal ganglion stroke in 2010, presented with mild confusion, slow processing time, initially was treated with aspirin, since her diagnosis of atrial fibrillation in 2015, she was started on anticoagulation Xarelto  She moved in with her daughter recently, no longer driving, but independent in her daily activity, doing laundry, ambulate without assistance, mild memory trouble today's Mini-Mental Status Examination is 26 out of 30  She had long-standing history of gradual onset bilateral hands tremor, was diagnosed with essential tremor, acute worsening since her stroke in 2010, they have tried variable dose of primidone up to 750 mg daily, currently taking 250 mg twice a day, which works out best for her, she still has posturing tremor of her bilateral hands  UPDATE Dec 11 2014: She is with her daughter Megan Munoz at today's clinical visit, continue has mild memory trouble, unsteady gait,  I have reviewed MRI report from outside hospital February 2016 1. No MR evidence of an acute intracranial abnormality. 2. Sequela of previous left frontal infarct (with resultant cystic encephalomalacia with surrounding gliosis). 3.  Age-appropriate global cerebral volume loss and background microvascular disease. 4. No MR evidence of abnormal intracranial enhancement. 5. Bilateral maxillary sinus disease with small fluid levels suggesting an acute sinusitis.  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  She also has a history of essential tremor gait abnormality and insomnia.  She also has a history of stress with minimal bruising and no bleeding.  Since last seen she received some physical therapy for her gait however she is not doing a home exercise program.  She denies any falls.  Her primidone was stopped after her last visit and her tremor is very intermittent.  It is not bothersome to her usually only bothers her during eating.  She takes melatonin for her insomnia with good results.  She continues to be very active does her own laundry, play solitaire and other  stimulating activities.  She returns for reevaluation  UPDATE July 26 2018 SS: Megan Munoz is an 85 year old  female with history of dementia likely central nervous system degenerative disorder, essential tremor, gait  abnormality, insomnia.  In April 2019 her MMSE was 26/30.  She is currently taking Aricept and Namenda.  In the past she has been on primidone.  Primidone was stopped about a year ago, because it was not very helpful for the tremors.  Since being off the medication, her and her daughter feel the tremors have gotten worse.  The tremors are mostly in her hands, right more so than left.  She used to notice them just when she was doing something, but now she will notice mild tremors in her hands when she is sitting resting.  Occasionally she will have tremors in her torso.  Her daughter reports back in [place] where they used to live they had inquired about a DBS, decided was not worth the risk.  In March 2020, she had a hospitalization for pneumonia, mild CHF, had to do some rehab.  She feels that her tremors have gotten worse, having to eat with her left hand.  She denies any weight loss, her appetite is fair, she does drink 2 ensures daily.  She reports continued trouble finding her words, knows what she wants to say but cannot get the words out.  She is currently living with her daughter, her daughter manages her medications.  She is to walk with a walker, denies any falls.  Observations/Objective: Alert, answers questions, follows commands, speech is clear, visible tremor to both hands when outstretched, right greater than the left, mild tremor noted to hands resting on lab, gait is intact, slow, close together steps, did not ambulate with a walker, tripped mid walk  Blind-Moca: 18/22 (normal > 18)  Assessment and Plan: 1.  Essential tremor 2.  Memory disturbance 3.  Gait abnormality  She has been off primidone for about a year.  Apparently she stopped the primidone because it was not very beneficial for the tremor.  Since being off the primidone they feel the tremor has gotten worse.  The tremor is most bothersome during eating, she is having to eat with her left hand.  We discussed possibly restarting  primidone.  They did not feel that the primidone was especially helpful for the tremor.  We discussed that the tremor may get worse as she ages.  We decided to wait on starting any medications at this time for the tremor.  She says she has not been on any other medications for tremor. She will continue taking Namenda and Aricept daily.  At her next visit in the office we will discuss trying physical therapy for gait training.  I sent in a refill for Aricept and Namenda.  Follow Up Instructions: 6 months    I discussed the assessment and treatment plan with the patient. The patient was provided an opportunity to ask questions and all were answered. The patient agreed with the plan and demonstrated an understanding of the instructions.   The patient was advised to call back or seek an in-person evaluation if the symptoms worsen or if the condition fails to improve as anticipated.  I provided 30 minutes of non-face-to-face time during this encounter.   Evangeline Dakin, DNP  [facility] [address] [place], [place] [address] [phone]

## 2018-08-02 NOTE — Progress Notes (Signed)
I have reviewed and agreed above plan. 

## 2018-08-04 ENCOUNTER — Other Ambulatory Visit: Payer: Self-pay

## 2018-08-15 ENCOUNTER — Telehealth: Payer: Self-pay | Admitting: *Deleted

## 2018-08-15 NOTE — Telephone Encounter (Signed)
Made 6 mo f/u appt with daughter, Maudie Mercury.  She asked about trying medication for tremor.  She feels that the remor more prominent now R greater then L hand (controlling remote, setting things down), does not affect ADLS at this point.  Primidone tried in past, (yrs) not much help, retry or  something else?  Pharmacy and allergies checked.

## 2018-08-16 ENCOUNTER — Other Ambulatory Visit: Payer: Self-pay | Admitting: Surgery

## 2018-08-16 ENCOUNTER — Other Ambulatory Visit (HOSPITAL_COMMUNITY): Payer: Self-pay | Admitting: Surgery

## 2018-08-16 DIAGNOSIS — C20 Malignant neoplasm of rectum: Secondary | ICD-10-CM

## 2018-08-16 NOTE — Telephone Encounter (Signed)
I called the patient and left a message asking for a return call to discuss medications for her tremor. She has tried primidone in the past without much benefit. I was going to offer topamax 50 mg twice daily.

## 2018-08-17 ENCOUNTER — Encounter (HOSPITAL_COMMUNITY): Payer: Self-pay

## 2018-08-17 ENCOUNTER — Ambulatory Visit (HOSPITAL_COMMUNITY)
Admission: RE | Admit: 2018-08-17 | Discharge: 2018-08-17 | Disposition: A | Discharge status: Home / Self Care | Payer: Medicare Other | Source: Ambulatory Visit | Attending: Surgery | Admitting: Surgery

## 2018-08-17 ENCOUNTER — Other Ambulatory Visit: Payer: Self-pay

## 2018-08-17 DIAGNOSIS — C20 Malignant neoplasm of rectum: Secondary | ICD-10-CM | POA: Diagnosis not present

## 2018-08-17 LAB — CREATININE, SERUM
Creatinine, Ser: 0.68 mg/dL (ref 0.44–1.00)
GFR calc Af Amer: >60 mL/min (ref >60)
GFR calc non Af Amer: >60 mL/min (ref >60)

## 2018-09-03 ENCOUNTER — Ambulatory Visit
Admission: RE | Admit: 2018-09-03 | Discharge: 2018-09-03 | Disposition: A | Discharge status: Home / Self Care | Payer: Medicare Other | Source: Ambulatory Visit | Attending: Surgery | Admitting: Surgery

## 2018-09-03 ENCOUNTER — Other Ambulatory Visit: Payer: Self-pay

## 2018-09-03 ENCOUNTER — Other Ambulatory Visit: Payer: Medicare Other

## 2018-09-03 DIAGNOSIS — C20 Malignant neoplasm of rectum: Secondary | ICD-10-CM

## 2018-09-03 MED ORDER — GADOBENATE DIMEGLUMINE 529 MG/ML IV SOLN
13.0000 mL | Freq: Once | INTRAVENOUS | Status: AC | PRN
  Administered 2018-09-03: 13 mL via INTRAVENOUS

## 2018-09-15 ENCOUNTER — Telehealth: Payer: Self-pay

## 2018-09-15 NOTE — Telephone Encounter (Signed)
Called and left VM on daughter's VM Maudie Mercury) advising of med/onc apt with Dr. Burr Medico on 6/24 @ 1:45 PM. I asked for a call back to discuss details of appointment. I provided my direct phone number.

## 2018-09-16 ENCOUNTER — Telehealth: Payer: Self-pay

## 2018-09-16 NOTE — Telephone Encounter (Signed)
Spoke directly with pts daughter, Maudie Mercury, and son-in-law. Explained referral to med/onc. They are requesting a WebEx visit. Message sent to Dr. Burr Medico.

## 2018-09-19 ENCOUNTER — Other Ambulatory Visit: Payer: Self-pay

## 2018-09-19 ENCOUNTER — Other Ambulatory Visit: Payer: Self-pay | Admitting: Surgery

## 2018-09-19 DIAGNOSIS — C20 Malignant neoplasm of rectum: Secondary | ICD-10-CM

## 2018-09-19 NOTE — Progress Notes (Signed)
Recommendations from GI Tumor Board 6.17.20  Referral to medical oncology and radiation oncology to consider Xeloda/RXT. Schedule CT chest to completes staging.

## 2018-09-20 ENCOUNTER — Telehealth: Payer: Self-pay | Admitting: *Deleted

## 2018-09-20 ENCOUNTER — Telehealth: Payer: Self-pay | Admitting: Nurse Practitioner

## 2018-09-20 ENCOUNTER — Encounter: Payer: Self-pay | Admitting: Radiation Oncology

## 2018-09-20 ENCOUNTER — Other Ambulatory Visit: Payer: Self-pay

## 2018-09-20 ENCOUNTER — Ambulatory Visit
Admission: RE | Admit: 2018-09-20 | Discharge: 2018-09-20 | Disposition: A | Discharge status: Home / Self Care | Payer: Medicare Other | Source: Ambulatory Visit | Attending: Radiation Oncology | Admitting: Radiation Oncology

## 2018-09-20 ENCOUNTER — Telehealth: Payer: Self-pay | Admitting: Hematology

## 2018-09-20 ENCOUNTER — Telehealth: Payer: Self-pay

## 2018-09-20 VITALS — Ht 67.0 in | Wt 140.0 lb

## 2018-09-20 DIAGNOSIS — C20 Malignant neoplasm of rectum: Secondary | ICD-10-CM | POA: Diagnosis not present

## 2018-09-20 DIAGNOSIS — I69318 Other symptoms and signs involving cognitive functions following cerebral infarction: Secondary | ICD-10-CM | POA: Diagnosis not present

## 2018-09-20 HISTORY — DX: Unspecified dementia, unspecified severity, without behavioral disturbance, psychotic disturbance, mood disturbance, and anxiety: F03.90

## 2018-09-20 NOTE — Telephone Encounter (Signed)
Spoke with patient's daughter and was able to listen to frustrations with lack of information up to this point. 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 I reassured her that between the information she received from Worthy Flank PA today about radiation oncology plan and information tomorrow from Dr. Burr Medico about oncology treatment plan, she would be up to date on the plan.  Patient voiced appreciation for the conversation and stated she will reach out to me or Dr. Ernestina Penna nurse with questions or concerns.

## 2018-09-20 NOTE — Progress Notes (Signed)
GI Location of Tumor / Histology: Rectal Cancer  Plan: ChemoRT, ?xeloda, ? Surgery after radiation (10-12 weeks post). ? SIM 6/24, start treatment week of 6/29 or 7/6.  Rectal adenocarcinoma T stage: T3d  Rectal adenocarcinoma N stage:  N0  Megan Munoz presented with rectal bleeding.  MRI Pelvis 09/03/2018: 3.0 cm polypoid mass along the right anterolateral aspect of the anal verge.  Biopsies of Colon/Rectum 06/01/2018   Past/Anticipated interventions by surgeon, if any:  Dr. Johney Maine 09/14/2018 -Distal anterior rectal mass fixed near sphincters and pushing into the rectovaginal septum with biopsy showing adenoma with at least high grade dysplasia. -I think this needs further workup.  CEA blood level, MRI pelvis.  If that confirms suspicion of cancer, discuss at GI tumor board.  If equivocal, she may require examination under anesthesia with more deeper biopsies.  If cancer confirmed, CT CAP as well to rule out metastatic disease. -I don't think she is a good candidate for transanal excision first as this thing is fixed to the sphincters and pushing in the rectovaginal septum.  Risk of rectovaginal fistula and sphincter injury/incontinence very high. -If it is cancer as I strongly suspect, she may be a candidate for chemoradiation therapy to help shrink the tumor in the hopes of getting a pathologic response. -If she does not get complete response or has recurrence, she would require abdominoperineal resection and possible posterior vaginectomy.  Past/Anticipated interventions by medical oncology, if any:  Dr. Burr Medico 09/21/2018   Weight changes, if any: None, fluctuates a couple pounds.  136-140 normal weight range.  Bowel/Bladder complaints, if any: Small marble size, hard stools, takes stool softener.  Nausea / Vomiting, if any: No  Pain issues, if any:  No  Any blood per rectum: Occasional Bleeding with stools.  Smears mostly.  SAFETY ISSUES:  Prior radiation? No  Pacemaker/ICD? No   Possible current pregnancy? Hysterectomy  Is the patient on methotrexate? No  Current Complaints/Details:

## 2018-09-20 NOTE — Telephone Encounter (Signed)
Called patient regarding upcoming Webex appointment, spoke with patient's daughter and patient will be notified. Webex invite has been sent.

## 2018-09-20 NOTE — Progress Notes (Signed)
Megan Munoz   [phone] [phone] [place] Note   Patient Care Team: Megan Bien, MD as PCP - General (Family Medicine) Megan Man, MD as PCP - Cardiology (Cardiology) Megan Juniper, MD as Consulting Physician (Gastroenterology) Megan Snipe, RN as Oncology Nurse Navigator   I connected with Megan Munoz on 09/21/2018 at 5:43 PM by video enabled telemedicine and verified that I am speaking with the correct person using two identifiers.   I discussed the limitations, risks, security and privacy concerns of performing an evaluation and management service by telephone and the availability of in person appointments. I also discussed with the patient that there may be a patient responsible charge related to this service. The patient expressed understanding and agreed to proceed.   Other persons participating in the visit and their role in the encounter: Megan Munoz, daughter  Patient's location:  Her daughter's home  Provider's location:  My office  Date of Service:  09/21/2018   CHIEF COMPLAINTS/PURPOSE OF CONSULTATION:  Newly diagnosed rectal carcinoma  REFERRING PHYSICIAN:  Dr. Michael Munoz, [facility]  Oncology History  Rectal cancer [facility])  06/01/2018 Imaging   COLONOSCOPY - Firm, nodular, thickened, raised area felt at the anal verge found on perianal exam. 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 Biopsied. - Two 6 to 8 mm polyps in the transverse colon, removed with a hot snare. Resected and retrieved. - One 7 mm polyp in the ascending colon, removed with a hot snare. Resected and retrieved. - One 6 mm polyp in the cecum, removed with a hot snare. Resected and retrieved. - One 4 mm polyp at the hepatic flexure, removed with a cold biopsy forceps. Resected and retrieved. - One 9 mm polyp in the descending colon, removed with a hot snare. Resected and retrieved. - Diverticulosis in the sigmoid colon and in the  descending colon.   06/01/2018 Initial Biopsy   Pathology from colonoscopy:  1. All colon polyps negative for malignancy. 2. Tubulovillous adenoma with at least high grade dysplasia at the rectal tumor. There were several foci highly suspicious but not definitive for invasive adenocarcinoma.    09/03/2018 Imaging   PELVIC MRI IMPRESSION: 1. 3.0 cm polypoid mass along the right anterolateral aspect of the anal verge, as described above. 2. Given the location, it is unclear whether this is more appropriately characterized as rectal cancer versus anal cancer. Regardless, given the clinical information, rectal cancer imaging characterization has been provided. 3. Rectal adenocarcinoma T stage: T3d 4. Rectal adenocarcinoma N stage:  N0 5. Distance from tumor to the internal anal sphincter is 0 cm.   09/19/2018 Miscellaneous   TUMOR BOARD: concurrent radiation therapy and chemotherapy with Xeloda   09/21/2018 Initial Diagnosis   Rectal cancer ([place])   09/21/2018 Cancer Staging   Staging form: Colon and Rectum, AJCC 8th Edition - Clinical: Stage IIA (cT3, cN0, cM0) - Signed by Megan Merle, MD on 09/21/2018      HISTORY OF PRESENTING ILLNESS:  Megan Munoz 85 y.o. female is a here because of a recent diagnosis of rectal adenocarcinoma. The patient was referred by Dr. Michael Munoz, [facility] and Dr. Therisa Munoz, [facility]. The patient presents over Webex today accompanied by her daughter Megan Munoz, who is her primary historian.   CT AP on 02/05/2018 showed no evidence of carcinoma despite intermittent rectal bleeding for the past year, initially thought to be hemorrhoids. Rectal bleeding improved on two occasions with steroid suppositories. Colonoscopy  on 06/01/18 showed a firm, nodular, thickened, raised area at the anal verge, likely for malignant tumor in addition to six polyps in the colon. Pathology confirmed all colon polyps negative for malignancy and tubulovillous adenoma with at least high  grade dysplasia at the rectal tumor. There were several foci highly suspicious but not definitive for invasive adenocarcinoma. She met with Dr. Michael Munoz for surgical consultation on 08/16/18 and was felt not to be a candidate for transanal excision as the tumor is fixed to the sphincters and pushing on the rectovaginal septum. CEA on 08/16/18 was 2.6. Pelvic MRI on 09/03/2018 showed a 3.0 x 2.5 x 2.2cm polypoid mass along the right anterolateral aspect of the anal verge. GI Tumor Board suggested radiation therapy and chemotherapy with Xeloda. She was seen in radiation oncology by Dr. Kyung Munoz on 09/20/18 and underwent CT simulation and radiation therapy was scheduled to begin next week.   She notes in the last 2 months, she has had stool in smaller pieces than previously. She denies any vaginal bleeding. She notes occasional blood in her stool and brown anal discharge when wiping. She denies any pain with bowel movements. She denies any weight loss, and her weight is stable between 135-140lbs. Her appetite is good. She denies any cough, shortness of breath, or chest pain. She denies any abdominal pain or nausea. Her energy level is good and she is able to perform ADLs. She denies any falls. She can walk short distances but spends more than half the day sitting, watching television, napping.    She has lived with her daughter and son-in-law for 5 years. Before that, she lived down the street from them in [place] following her stroke. She is originally from [place]. She quit smoking several years ago.   She has a PMHx of iron deficiency anemia. She has congestive heart failure, with only one major episode and she is currently on Lasix. She is followed in cariology by Megan Munoz. She denies any history of heart attack. She has a history of COPD and is on Trelegy. She had a hysterectomy. She has a history of stroke in 2010, for which she has been on Xarelto. She has a history of early onset dementia,  present since the stroke, and is currently on Aricept and Namenda. She is followed in neurology by Dr. Krista Munoz. She is currently on Prozac for depression and anxiety.    REVIEW OF SYSTEMS:   Constitutional: Denies fevers, chills or abnormal night sweats Eyes: Denies blurriness of vision, double vision or watery eyes Ears, nose, mouth, throat, and face: Denies mucositis or sore throat Respiratory: Denies cough, dyspnea or wheezes Cardiovascular: Denies palpitation, chest discomfort or lower extremity swelling Gastrointestinal:  Denies nausea, heartburn (+) blood in stool (+) anal discharge Skin: Denies abnormal skin rashes Lymphatics: Denies new lymphadenopathy or easy bruising Neurological:Denies numbness, tingling or new weaknesses Behavioral/Psych: Mood is stable, no new changes (+) anxiety All other systems were reviewed with the patient and are negative.   MEDICAL HISTORY:  Past Medical History:  Diagnosis Date   Anxiety    Atherosclerosis of aortic arch [facility]) April 2017   Noted on CT chest, calcified aortic atherosclerosis at the arch.   Atrial fibrillation, permanent 03/2013   Previously on Atenolol for Rate - now on combination diltiazem and carvedilol for rate Control;  & Xarelto for AC. CHADS2VASC2 = 7.   Cerebral infarction [facility]) 10/2008   MRI Brain 05/2014 for near syncope revealed: No acure infarct.  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   Mild small vessel disease type changes.  No intracranial hemorrhage.  Global atrophy.    CHF (congestive heart failure) (HCC)    COPD (chronic obstructive pulmonary disease) ([place])    Coronary atherosclerosis due to calcified coronary lesion April 2017   Noted on CT chest   Dementia [facility])    following 2010 stroke   Hyperlipemia    Hypertension    Pneumonia    PONV (postoperative nausea and vomiting)    Stroke ([place])    2010-  patient has dementia related to stroke    Tinea unguium    Tremor    Tuberculosis     SURGICAL HISTORY: Past Surgical History:  Procedure Laterality Date   BIOPSY  06/01/2018   Procedure: BIOPSY;  Surgeon: Megan Juniper, MD;  Location: [facility];  Service: Gastroenterology;;   BLADDER SURGERY     CARPAL TUNNEL RELEASE Left    COLONOSCOPY N/A 06/01/2018   Procedure: COLONOSCOPY;  Surgeon: Megan Juniper, MD;  Location: [facility];  Service: Gastroenterology;  Laterality: N/A;   ESOPHAGOGASTRODUODENOSCOPY (EGD) WITH PROPOFOL N/A 02/04/2018   Procedure: ESOPHAGOGASTRODUODENOSCOPY (EGD) WITH PROPOFOL;  Surgeon: Megan Juniper, MD;  Location: [facility];  Service: Gastroenterology;  Laterality: N/A;   FOOT SURGERY Right    HERNIA REPAIR     NASAL SINUS SURGERY     POLYPECTOMY  06/01/2018   Procedure: POLYPECTOMY;  Surgeon: Megan Juniper, MD;  Location: [facility];  Service: Gastroenterology;;   TOTAL ABDOMINAL HYSTERECTOMY      SOCIAL HISTORY: Social History   Socioeconomic History   Marital status: Widowed    Spouse name: Not on file   Number of children: 1   Years of education: 15   Highest education level: Not on file  Occupational History   Occupation: Retired  Scientist, product/process development strain: Not on file   Food insecurity    Worry: Not on file    Inability: Not on Lexicographer needs    Medical: No    Non-medical: No  Tobacco Use   Smoking status: Former Smoker    Packs/day: 1.00    Years: 16.00    Pack years: 16.00    Start date: 03/30/1954    Quit date: 03/30/1970    Years since quitting: 48.5   Smokeless tobacco: Never Used   Tobacco comment: smoked 1/2-1 ppd  Substance and Sexual Activity   Alcohol use: Not Currently    Alcohol/week: 0.0 standard drinks   Drug use: No   Sexual activity: Not on file  Lifestyle   Physical activity    Days per week: Not on file    Minutes per session: Not on file   Stress: Not on file    Relationships   Social connections    Talks on phone: Not on file    Gets together: Not on file    Attends religious service: Not on file    Active member of club or organization: Not on file    Attends meetings of clubs or organizations: Not on file    Relationship status: Not on file   Intimate partner violence    Fear of current or ex partner: Not on file    Emotionally abused: Not on file    Physically abused: Not on file    Forced sexual activity: Not on file  Other Topics Concern  Not on file  Social History Narrative   Widow.  Lives with her daughter & her family.  (Son-in-law & 5 Grandsons).   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   2-4 cups caffeine/day   1-2 glassess of wine/beer  Per week.  Never smoked.      [facility]:   Originally from [place]. She lived in [place], [place], [place], & [place]. 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 No bird exposure. No mold or hot tub exposure.     FAMILY HISTORY: Family History  Problem Relation Age of Onset   Osteoporosis Mother    Emphysema Father    Lung disease Father        Black Lung   Stroke Brother    Cancer Maternal Grandmother    Emphysema Paternal Grandmother    Asthma Daughter        allergy induced    ALLERGIES:  is allergic to tetanus toxoids.  MEDICATIONS:  Current Outpatient Medications  Medication Sig Dispense Refill   albuterol (PROVENTIL) (2.5 MG/3ML) 0.083% nebulizer solution Take 3 mLs (2.5 mg total) by nebulization every 6 (six) hours as needed for wheezing or shortness of breath. 30 vial 1   Azelastine HCl 137 MCG/SPRAY SOLN Place 2 sprays into both nostrils 2 (two) times daily. Use in each nostril as directed      B Complex Vitamins (VITAMIN B COMPLEX PO) Take 1 tablet by mouth daily.      benzonatate (TESSALON) 100 MG capsule Take 2 capsules (200 mg total) by mouth 3 (three) times daily as needed for cough. 21 capsule 0    BISACODYL 5 MG EC tablet TK AS DIRECTED     calcitonin, salmon, (MIACALCIN) 200 UNIT/ACT nasal spray Place 1 spray into alternate nostrils daily.     capecitabine (XELODA) 500 MG tablet Take 3 tablets (1,500 mg total) by mouth 2 (two) times daily after a meal. Take on days of radiation, Monday through Friday 168 tablet 0   carvedilol (COREG) 12.5 MG tablet TAKE 1 TABLET TWICE A DAY WITH MEALS (DUE FOR FOLLOW UP APPOINTMENT IN JUNE) 180 tablet 4   Cholecalciferol (VITAMIN D3 SUPER STRENGTH) 2000 units CAPS Take 2,000 Units by mouth daily.      diltiazem (CARDIZEM CD) 120 MG 24 hr capsule TAKE 3 CAPSULES DAILY (PLEASE SCHEDULE AN APPOINTMENT FOR FURTHER REFILLS) (Patient taking differently: Take 360 mg by mouth daily. ) 360 capsule 4   docusate sodium (COLACE) 100 MG capsule Take 100-200 mg by mouth 2 (two) times daily as needed for mild constipation. Pt does take at least 100 mg daily     donepezil (ARICEPT) 10 MG tablet Take 1 tablet (10 mg total) by mouth at bedtime. 90 tablet 3   feeding supplement, ENSURE ENLIVE, (ENSURE ENLIVE) LIQD Take 237 mLs by mouth 3 (three) times daily between meals. (Patient taking differently: Take 237 mLs by mouth See admin instructions. 2-3  Times daily) 237 mL 12   ferrous sulfate 325 (65 FE) MG tablet Take 1 tablet (325 mg total) by mouth every other day. 30 tablet 0   FLUoxetine (PROZAC) 40 MG capsule Take 40 mg by mouth daily.      Fluticasone-Umeclidin-Vilant (TRELEGY ELLIPTA) 100-62.5-25 MCG/INH AEPB Inhale 1 puff into the lungs daily.     furosemide (LASIX) 20 MG tablet Take 1 tablet (20 mg total) by mouth daily as needed for fluid or edema. (Patient taking differently: Take 20 mg  by mouth every other day. ) 30 tablet 6   levofloxacin (LEVAQUIN) 500 MG tablet Take 1 tablet (500 mg total) by mouth daily. 7 tablet 0   loratadine (CLARITIN) 10 MG tablet Take 10 mg by mouth daily.     Melatonin 10 MG TABS Take 10 mg by mouth at bedtime.       memantine (NAMENDA) 10 MG tablet Take 1 tablet (10 mg total) by mouth 2 (two) times daily. 180 tablet 3   Multiple Vitamin (MULTIVITAMIN) capsule Take 1 capsule by mouth daily.     predniSONE (DELTASONE) 20 MG tablet 2 tabs po daily x 3 days 6 tablet 0   promethazine-dextromethorphan (PROMETHAZINE-DM) 6.25-15 MG/5ML syrup TK 5 ML PO QID PRN     rivaroxaban (XARELTO) 10 MG TABS tablet Take 10 mg by mouth daily.     simvastatin (ZOCOR) 40 MG tablet Take 40 mg by mouth daily.     No current facility-administered medications for this visit.     PHYSICAL EXAMINATION: ECOG PERFORMANCE STATUS: 2 - Symptomatic, <50% confined to bed  There were no vitals filed for this visit. There were no vitals filed for this visit.  Exam not performed due to the virtual visit.   LABORATORY DATA:  I have reviewed the data as listed CBC Latest Ref Rng & Units 06/13/2018 02/07/2018 02/06/2018  WBC 4.0 - 10.5 K/uL 6.0 8.6 7.9  Hemoglobin 12.0 - 15.0 g/dL 12.2 10.4(L) 10.0(L)  Hematocrit 36.0 - 46.0 % 40.0 33.5(L) 31.8(L)  Platelets 150 - 400 K/uL 234 262 237    CMP Latest Ref Rng & Units 08/17/2018 06/13/2018 02/07/2018  Glucose 70 - 99 mg/dL - 132(H) 103(H)  BUN 8 - 23 mg/dL - 20 16  Creatinine 0.44 - 1.00 mg/dL 0.68 0.71 0.46  Sodium 135 - 145 mmol/L - 141 139  Potassium 3.5 - 5.1 mmol/L - 5.1 3.5  Chloride 98 - 111 mmol/L - 108 102  CO2 22 - 32 mmol/L - 25 30  Calcium 8.9 - 10.3 mg/dL - 9.2 8.5(L)  Total Protein 6.5 - 8.1 g/dL - 6.5 -  Total Bilirubin 0.3 - 1.2 mg/dL - 0.3 -  Alkaline Phos 38 - 126 U/L - 76 -  AST 15 - 41 U/L - 34 -  ALT 0 - 44 U/L - 27 -     RADIOGRAPHIC STUDIES: I have personally reviewed the radiological images as listed and agreed with the findings in the report. Mr Pelvis W Wo Contrast  Result Date: 09/05/2018 CLINICAL DATA:  Newly diagnosed rectal carcinoma, for staging EXAM: MRI PELVIS WITHOUT AND WITH CONTRAST TECHNIQUE: Multiplanar multisequence MR imaging of the  pelvis was performed both before and after administration of intravenous contrast. Small amount of [place] gel was administered per rectum to optimize tumor evaluation. CONTRAST:  22m MULTIHANCE GADOBENATE DIMEGLUMINE 529 MG/ML IV SOLN COMPARISON:  CT abdomen/pelvis dated 02/05/2018 FINDINGS: Polypoid mass along the right anterolateral aspect of the anal verge, extending through the muscularis propria, measuring 3.0 (length) x 2.5 cm (width) x 2.2 cm (orthogonal depth). TUMOR LOCATION Tumor distance from Anal Verge/Skin Surface:  0 cm Tumor distance from Internal Anal Sphincter:  0 cm TUMOR DESCRIPTION Circumferential Extent: Extends from 2:00 to 9:00 (series 5/image 41) Tumor Length: 3.0 cm T - CATEGORY Extension through Muscularis Propria: Yes>115mT3d, extending at least 16 mm Shortest Distance of any tumor/node from Mesorectal Fascia: 0 mm Extramural Vascular Invasion/Tumor Thrombus: No Invasion of Anterior Peritoneal Reflection: No Involvement of Adjacent Organs or Pelvic  Sidewall: No, tumor abuts but does not definitely invade the right posterior aspect of the prostate (series 5/image 42). Levator Ani Involvement: No N - CATEGORY Mesorectal Lymph Nodes >=31m: None=N0 Extra-mesorectal Lymphadenopathy: No Other:  None. IMPRESSION: 3.0 cm polypoid mass along the right anterolateral aspect of the anal verge, as described above. Given the location, it is unclear whether this is more appropriately characterized as rectal cancer versus anal cancer. Regardless, given the clinical information, rectal cancer imaging characterization has been provided. Rectal adenocarcinoma T stage: T3d Rectal adenocarcinoma N stage:  N0 Distance from tumor to the internal anal sphincter is 0 cm. Electronically Signed   By: SJulian HyM.D.   On: 09/05/2018 08:33    ASSESSMENT & PLAN:  JChisom Munteanis a 85y.o. caucasian female with   1. Distal renal adenocarcinoma, cT3N0M0 stage IIA -She presented with recurrent mild rectal  bleeding over 1 year, no pain, weight loss, or other symptoms.   -I reviewed colonoscopy findings and biopsy results.  Distal rectal mass is large, biopsy showed tubulovillous adenoma with at least high-grade dysplasia, and they are focI highly suspicious but not definitive invasive adenocarcinoma. Her pelvic MRI showed a distal rectal mass, clinical T3 lesion, no abnormal nodes.  -Based on the above, and her advanced age, I do not think she needs further biopsy, this is most consistent rectal cancer -she will have CT chest to complete staging -We discussed the standard therapy is neoadjuvant chemotherapy/radiation, followed by surgery.  However due to her advanced age, and dementia, she is not a good candidate for APR. She has seen Dr. GJohney Maine -if she has complete response to chemo RT (20-25% chance), we will likely recommend wait and watch approach and hold surgery  -I discussed the role of chemotherapy, Xeloda with concurrent radiation.  Effects discussed with her, especially fatigue, cytopenias, diarrhea, risk of infections, hand-foot syndrome, etc. patient and her daughter agrees to proceed. -will start next Monday -lab, f/u weekly during her chemoRT    2. HTN and Congestive heart failure  -On Cardizem, Lasix 227mdaily   3. History of stroke and dementia -On Xarelto 1023maily -On Aricept 78m19mce daily and Namenda 78mg47mce daily -pt is able to do all ADLs, lives with her daughter and son-in-law, has good support  -Followed in neurology by Dr. Jacki Couse  Megan Munoz Depression and anxiety -On Prozac 40mg 37my    PLAN:  -ChemoRT with Xeloda beginning on 09/26/18, Xeloda 1500mg q29mon days of radiation. [facility] -Labs and f/u weekly for next 6 weeks   Orders Placed This Encounter  Procedures   CBC with Differential (Cancer [place]   Standing Status:   Standing    Number of Occurrences:   50    Standing Expiration Date:   09/21/2023   CMP (Cancer [place]   Standing Status:   Standing    Number of Occurrences:   50    Standing Expiration Date:   09/21/2023    I discussed the assessment and treatment plan with the patient. The patient was provided an opportunity to ask questions and all were answered. The patient agreed with the plan and demonstrated an understanding of the instructions.  The patient was advised to call back or seek an in-person evaluation if the symptoms worsen or if the condition fails to improve as anticipated.  I provided 50 minutes of face-to-face video visit time during this encounter, and >  50% was spent counseling as documented under my assessment & plan.    Megan Merle, MD 09/21/2018 5:43 PM  I, Cloyde Reams Dorshimer, am acting as scribe for Megan Merle, MD.   I have reviewed the above documentation for accuracy and completeness, and I agree with the above.

## 2018-09-20 NOTE — Telephone Encounter (Signed)
Left voicemail for Megan Munoz letting her know that we have clearance for her to accompany her mom for her CT simulation appointment 09/21/2018.  I asked her to arrive at the cancer center rear entrance and call our department ([phone]) and well will meet them at the door to perform our screening and take their temperatures. I asked that they both wear a face mask and if they do not have one to let us know and we will provide one.  I left my call back number in the event she has any other questions.  Will continue to follow as necessary.  Gloriajean Dell. Leonie Green, BSN

## 2018-09-20 NOTE — Progress Notes (Addendum)
Radiation Oncology         [phone] ________________________________  Name: Megan Munoz        MRN: [id number]  Date of Service: 09/20/2018 DOB: 12-30-1932  IF:OYDXA, Mechele Claude, MD  Michael Boston, MD     REFERRING PHYSICIAN: Michael Boston, MD   DIAGNOSIS: The encounter diagnosis was Rectal cancer [facility]).   HISTORY OF PRESENT ILLNESS: Megan Munoz is a 85 y.o. female seen at the request of Dr. Johney Maine for a newly diagnosed rectal cancer. The patient has a history of a stroke in 2010 and has had cognitive deficits since, but thought for about a year and a half that she was having bleeding from the rectum from hemorrhoids. She ultimately started seeing this more frequently and was seen by Dr. Therisa Doyne in GI, and on 06/01/2018 a colonoscopy revealed multiple polyps that were hyperplastic and tubulovillous. In the rectum by exam there was a large fixed mass that appeared to involve the rectovaginal septum suspicious for carcinoma. A biopsy of the rectal tumor during her colonoscopy showed at least high grade dysplasia, and in conference, it was suspicious for but not diagnostic of carcinoma. Given the clinical picture however it was decided that it was riskier to put her back through another procedure than to treat this clinically as an adenocarcinoma of the rectum. She had an MRI of the pelvis with rectal protocol on 09/03/2018 revealing a T3dN0 picture. She is seen today with her family to discuss options of chemoRT. She may be a surgical candidate, but this has not been decided definitively.   REVIOUS RADIATION THERAPY: No   PAST MEDICAL HISTORY:  Past Medical History:  Diagnosis Date   Anxiety    Atherosclerosis of aortic arch [facility]) April 2017   Noted on CT chest, calcified aortic atherosclerosis at the arch.   Atrial fibrillation, permanent 03/2013   Previously on Atenolol for Rate - now on combination diltiazem and carvedilol for rate Control;  & Xarelto for AC. CHADS2VASC2 = 7.   Cerebral  infarction [facility]) 10/2008   MRI Brain 05/2014 for near syncope revealed: No acure infarct.  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   Mild small vessel disease type changes.  No intracranial hemorrhage.  Global atrophy.    CHF (congestive heart failure) (HCC)    COPD (chronic obstructive pulmonary disease) ([place])    Coronary atherosclerosis due to calcified coronary lesion April 2017   Noted on CT chest   Dementia [facility])    following 2010 stroke   Hyperlipemia    Hypertension    Pneumonia    PONV (postoperative nausea and vomiting)    Stroke ([place])    2010- patient has dementia related to stroke    Tinea unguium    Tremor    Tuberculosis        PAST SURGICAL HISTORY: Past Surgical History:  Procedure Laterality Date   BIOPSY  06/01/2018   Procedure: BIOPSY;  Surgeon: Ronnette Juniper, MD;  Location: [facility];  Service: Gastroenterology;;   BLADDER SURGERY     CARPAL TUNNEL RELEASE Left    COLONOSCOPY N/A 06/01/2018   Procedure: COLONOSCOPY;  Surgeon: Ronnette Juniper, MD;  Location: [facility];  Service: Gastroenterology;  Laterality: N/A;   ESOPHAGOGASTRODUODENOSCOPY (EGD) WITH PROPOFOL N/A 02/04/2018   Procedure: ESOPHAGOGASTRODUODENOSCOPY (EGD) WITH PROPOFOL;  Surgeon: Ronnette Juniper, MD;  Location: [place];  Service: Gastroenterology;  Laterality: N/A;   FOOT SURGERY  Right    HERNIA REPAIR     NASAL SINUS SURGERY     POLYPECTOMY  06/01/2018   Procedure: POLYPECTOMY;  Surgeon: Ronnette Juniper, MD;  Location: [facility];  Service: Gastroenterology;;   TOTAL ABDOMINAL HYSTERECTOMY       FAMILY HISTORY:  Family History  Problem Relation Age of Onset   Osteoporosis Mother    Emphysema Father    Lung disease Father        Black Lung   Stroke Brother    Cancer Maternal Grandmother    Emphysema Paternal Grandmother    Asthma Daughter        allergy induced      SOCIAL HISTORY:  reports that she quit smoking about 48 years ago. She started smoking about 64 years ago. She has a 16.00 pack-year smoking history. She has never used smokeless tobacco. She reports previous alcohol use. She reports that she does not use drugs.   ALLERGIES: Tetanus toxoids   MEDICATIONS:  Current Outpatient Medications  Medication Sig Dispense Refill   albuterol (PROVENTIL) (2.5 MG/3ML) 0.083% nebulizer solution Take 3 mLs (2.5 mg total) by nebulization every 6 (six) hours as needed for wheezing or shortness of breath. 30 vial 1   Azelastine HCl 137 MCG/SPRAY SOLN Place 2 sprays into both nostrils 2 (two) times daily. Use in each nostril as directed      B Complex Vitamins (VITAMIN B COMPLEX PO) Take 1 tablet by mouth daily.      benzonatate (TESSALON) 100 MG capsule Take 2 capsules (200 mg total) by mouth 3 (three) times daily as needed for cough. 21 capsule 0   calcitonin, salmon, (MIACALCIN) 200 UNIT/ACT nasal spray Place 1 spray into alternate nostrils daily.     carvedilol (COREG) 12.5 MG tablet TAKE 1 TABLET TWICE A DAY WITH MEALS (DUE FOR FOLLOW UP APPOINTMENT IN JUNE) 180 tablet 4   Cholecalciferol (VITAMIN D3 SUPER STRENGTH) 2000 units CAPS Take 2,000 Units by mouth daily.      diltiazem (CARDIZEM CD) 120 MG 24 hr capsule TAKE 3 CAPSULES DAILY (PLEASE SCHEDULE AN APPOINTMENT FOR FURTHER REFILLS) (Patient taking differently: Take 360 mg by mouth daily. ) 360 capsule 4   docusate sodium (COLACE) 100 MG capsule Take 100-200 mg by mouth 2 (two) times daily as needed for mild constipation. Pt does take at least 100 mg daily     donepezil (ARICEPT) 10 MG tablet Take 1 tablet (10 mg total) by mouth at bedtime. 90 tablet 3   feeding supplement, ENSURE ENLIVE, (ENSURE ENLIVE) LIQD Take 237 mLs by mouth 3 (three) times daily between meals. (Patient taking differently: Take 237 mLs by mouth See admin instructions. 2-3  Times daily) 237 mL 12   ferrous sulfate  325 (65 FE) MG tablet Take 1 tablet (325 mg total) by mouth every other day. 30 tablet 0   FLUoxetine (PROZAC) 40 MG capsule Take 40 mg by mouth daily.      Fluticasone-Umeclidin-Vilant (TRELEGY ELLIPTA) 100-62.5-25 MCG/INH AEPB Inhale 1 puff into the lungs daily.     furosemide (LASIX) 20 MG tablet Take 1 tablet (20 mg total) by mouth daily as needed for fluid or edema. (Patient taking differently: Take 20 mg by mouth every other day. ) 30 tablet 6   levofloxacin (LEVAQUIN) 500 MG tablet Take 1 tablet (500 mg total) by mouth daily. 7 tablet 0   loratadine (CLARITIN) 10 MG tablet Take 10 mg by mouth daily.     Melatonin 10 MG  TABS Take 10 mg by mouth at bedtime.      memantine (NAMENDA) 10 MG tablet Take 1 tablet (10 mg total) by mouth 2 (two) times daily. 180 tablet 3   Multiple Vitamin (MULTIVITAMIN) capsule Take 1 capsule by mouth daily.     predniSONE (DELTASONE) 20 MG tablet 2 tabs po daily x 3 days 6 tablet 0   rivaroxaban (XARELTO) 10 MG TABS tablet Take 10 mg by mouth daily.     simvastatin (ZOCOR) 40 MG tablet Take 40 mg by mouth daily.     BISACODYL 5 MG EC tablet TK AS DIRECTED     promethazine-dextromethorphan (PROMETHAZINE-DM) 6.25-15 MG/5ML syrup TK 5 ML PO QID PRN     No current facility-administered medications for this encounter.      REVIEW OF SYSTEMS: On review of systems, the patient reports that she is doing well overall. She reports daily blood on toilet paper following bowel movements but is not having any bright red blood outside of this. She has had about 10 pounds of fluctuation in weight in the last few months and this is attributed to her CHF and some low points in the fall when she had a lessened appetite around the time of a CHF exacerbation and pneumonia. She denies any chest pain, shortness of breath, cough, fevers, chills, night sweats. She denies any other bowel or bladder disturbances, and denies abdominal pain, nausea or vomiting. She denies any new  musculoskeletal or joint aches or pains. A complete review of systems is obtained and is otherwise negative.     PHYSICAL EXAM:  Wt Readings from Last 3 Encounters:  09/20/18 140 lb (63.5 kg)  06/01/18 137 lb (62.1 kg)  02/07/18 126 lb 12.2 oz (57.5 kg)   Temp Readings from Last 3 Encounters:  06/13/18 98 F (36.7 C) (Oral)  06/01/18 97.7 F (36.5 C) (Oral)  02/07/18 97.8 F (36.6 C)   BP Readings from Last 3 Encounters:  06/13/18 (!) 145/86  06/01/18 105/80  02/07/18 (!) 146/73   Pulse Readings from Last 3 Encounters:  06/13/18 99  06/01/18 99  02/07/18 (!) 112   Pain Assessment Pain Score: 0-No pain/10 In general this is a well appearing caucasian female in no acute distress. She's alert and oriented x4 and appropriate throughout the examination. Cardiopulmonary assessment is negative for acute distress and she exhibits normal effort.    ECOG = 1  0 - Asymptomatic (Fully active, able to carry on all predisease activities without restriction)  1 - Symptomatic but completely ambulatory (Restricted in physically strenuous activity but ambulatory and able to carry out work of a light or sedentary nature. For example, light housework, office work)  2 - Symptomatic, <50% in bed during the day (Ambulatory and capable of all self care but unable to carry out any work activities. Up and about more than 50% of waking hours)  3 - Symptomatic, >50% in bed, but not bedbound (Capable of only limited self-care, confined to bed or chair 50% or more of waking hours)  4 - Bedbound (Completely disabled. Cannot carry on any self-care. Totally confined to bed or chair)  5 - Death   Eustace Pen MM, Creech RH, Tormey DC, et al. [phone]). "Toxicity and response criteria of the [facility]". [place] Oncol. 5 (6): 649-55    LABORATORY DATA:  Lab Results  Component Value Date   WBC 6.0 06/13/2018   HGB 12.2 06/13/2018   HCT 40.0 06/13/2018   MCV  95.0 06/13/2018    PLT 234 06/13/2018   Lab Results  Component Value Date   NA 141 06/13/2018   K 5.1 06/13/2018   CL 108 06/13/2018   CO2 25 06/13/2018   Lab Results  Component Value Date   ALT 27 06/13/2018   AST 34 06/13/2018   ALKPHOS 76 06/13/2018   BILITOT 0.3 06/13/2018      RADIOGRAPHY: Mr Pelvis W Wo Contrast  Result Date: 09/05/2018 CLINICAL DATA:  Newly diagnosed rectal carcinoma, for staging EXAM: MRI PELVIS WITHOUT AND WITH CONTRAST TECHNIQUE: Multiplanar multisequence MR imaging of the pelvis was performed both before and after administration of intravenous contrast. Small amount of [place] gel was administered per rectum to optimize tumor evaluation. CONTRAST:  30mL MULTIHANCE GADOBENATE DIMEGLUMINE 529 MG/ML IV SOLN COMPARISON:  CT abdomen/pelvis dated 02/05/2018 FINDINGS: Polypoid mass along the right anterolateral aspect of the anal verge, extending through the muscularis propria, measuring 3.0 (length) x 2.5 cm (width) x 2.2 cm (orthogonal depth). TUMOR LOCATION Tumor distance from Anal Verge/Skin Surface:  0 cm Tumor distance from Internal Anal Sphincter:  0 cm TUMOR DESCRIPTION Circumferential Extent: Extends from 2:00 to 9:00 (series 5/image 41) Tumor Length: 3.0 cm T - CATEGORY Extension through Muscularis Propria: Yes>34mm=T3d, extending at least 16 mm Shortest Distance of any tumor/node from Mesorectal Fascia: 0 mm Extramural Vascular Invasion/Tumor Thrombus: No Invasion of Anterior Peritoneal Reflection: No Involvement of Adjacent Organs or Pelvic Sidewall: No, tumor abuts but does not definitely invade the right posterior aspect of the prostate (series 5/image 42). Levator Ani Involvement: No N - CATEGORY Mesorectal Lymph Nodes >=49mm: None=N0 Extra-mesorectal Lymphadenopathy: No Other:  None. IMPRESSION: 3.0 cm polypoid mass along the right anterolateral aspect of the anal verge, as described above. Given the location, it is unclear whether this is more appropriately characterized as rectal  cancer versus anal cancer. Regardless, given the clinical information, rectal cancer imaging characterization has been provided. Rectal adenocarcinoma T stage: T3d Rectal adenocarcinoma N stage:  N0 Distance from tumor to the internal anal sphincter is 0 cm. Electronically Signed   By: Julian Hy M.D.   On: 09/05/2018 08:33       IMPRESSION/PLAN: 1. Stage IIA, cT3dN0Mx clinical carcinoma of the rectum. Dr. Lisbeth Renshaw discusses the pathology findings and reviews the nature of high grade dysplasia versus carcinoma. We reviewed the discussion from GI conference regarding treatment of her situation. She would benefit from radiotherapy and chemosensitization for her  locally advanced rectal cancer. We discussed the risks, benefits, short, and long term effects of radiotherapy, and the patient is interested in proceeding. Dr. Lisbeth Renshaw discusses the delivery and logistics of radiotherapy and anticipates a course of 5 1/2 weeks of radiotherapy. We will simulate tomorrow at 9 am. We anticipate starting treatment 09/26/2018.  2. Questions about sacral lesion on prior CT scans. We discussed the MRI did not discuss any lesions of concern in the sacrum as previously seen on CT. We will follow this expectantly.  3. Dementia. The patient needs her family member's assistance for checking in for treatments and we will reach out to the department and registration to coordinate with the patient and family.  This encounter was provided by telemedicine platform Webex.  The patient has given verbal consent for this type of encounter and has been advised to only accept a meeting of this type in a secure network environment. The time spent during this encounter was 90 minutes. The attendants for this meeting include Blenda Nicely, RN, Dr. Lisbeth Renshaw,  Hayden Pedro  and [name]. Ms. Lorin Picket daughter Maudie Mercury and son in law Clair Gulling were also both on the call. Maudie Mercury is her HCPOA. During the encounter,  Blenda Nicely, RN, Dr. Lisbeth Renshaw, and  Hayden Pedro were located at [facility] Radiation Oncology Department.  [name] and her family were located at home.   The above documentation reflects my direct findings during this shared patient visit. Please see the separate note by Dr. Lisbeth Renshaw on this date for the remainder of the patient's plan of care.    Carola Rhine, PAC

## 2018-09-21 ENCOUNTER — Telehealth: Payer: Self-pay | Admitting: Hematology

## 2018-09-21 ENCOUNTER — Inpatient Hospital Stay: Payer: Medicare Other | Attending: Nurse Practitioner | Admitting: Hematology

## 2018-09-21 ENCOUNTER — Encounter: Payer: Self-pay | Admitting: Hematology

## 2018-09-21 ENCOUNTER — Other Ambulatory Visit: Payer: Self-pay

## 2018-09-21 ENCOUNTER — Ambulatory Visit
Admission: RE | Admit: 2018-09-21 | Discharge: 2018-09-21 | Disposition: A | Discharge status: Home / Self Care | Payer: Medicare Other | Source: Ambulatory Visit | Attending: Radiation Oncology | Admitting: Radiation Oncology

## 2018-09-21 ENCOUNTER — Telehealth: Payer: Self-pay | Admitting: Pharmacist

## 2018-09-21 VITALS — Temp 98.4°F

## 2018-09-21 DIAGNOSIS — C20 Malignant neoplasm of rectum: Secondary | ICD-10-CM | POA: Insufficient documentation

## 2018-09-21 DIAGNOSIS — F329 Major depressive disorder, single episode, unspecified: Secondary | ICD-10-CM | POA: Insufficient documentation

## 2018-09-21 DIAGNOSIS — Z510 Encounter for antineoplastic radiation therapy: Secondary | ICD-10-CM | POA: Diagnosis not present

## 2018-09-21 DIAGNOSIS — Z8673 Personal history of transient ischemic attack (TIA), and cerebral infarction without residual deficits: Secondary | ICD-10-CM | POA: Insufficient documentation

## 2018-09-21 DIAGNOSIS — F419 Anxiety disorder, unspecified: Secondary | ICD-10-CM | POA: Insufficient documentation

## 2018-09-21 DIAGNOSIS — Z87891 Personal history of nicotine dependence: Secondary | ICD-10-CM

## 2018-09-21 DIAGNOSIS — I2510 Atherosclerotic heart disease of native coronary artery without angina pectoris: Secondary | ICD-10-CM | POA: Insufficient documentation

## 2018-09-21 DIAGNOSIS — I110 Hypertensive heart disease with heart failure: Secondary | ICD-10-CM | POA: Insufficient documentation

## 2018-09-21 MED ORDER — CAPECITABINE 500 MG PO TABS: 825.0000 mg/m2 | ORAL_TABLET | Freq: Two times a day (BID) | ORAL | 0 refills | Status: DC

## 2018-09-21 MED ORDER — CAPECITABINE 500 MG PO TABS: 825.0000 mg/m2 | ORAL_TABLET | Freq: Two times a day (BID) | ORAL | 1 refills | Status: DC

## 2018-09-21 NOTE — Telephone Encounter (Signed)
Oral Oncology Pharmacist Encounter  Received new prescription for Xeloda (capecitabine) for the neoadjuvant treatment of stage II rectal cancer in conjunction with radiation, planned duration 28 treatment days over 38 calendar days.  Radiation is scheduled 09/26/18 - 11/03/18  Xeloda is planned to be administered at ~867 mg/m2 (1500mg ) by mouth 2 times daily on days of radiation only, Monday - Friday  Labs from [place] assessed, [place] for treatment initiation. Labs are planned to be repeated on 09/26/18 08/17/18 SCr=0.68, round to 1.0 for age > 17, est CrCl ~ 45 mL/min, Epic is calculating GFR at > 60 mL/min  Current medication list in Epic reviewed, moderate DDI with Xeloda and multivitamin identified:  Most multivitamins contain folic acid, which is a category C interaction.  Folic acid may enhance the adverse or toxic effect of fluorouracil products by potentiating its mechanism of action.  Patient will be instructed to hold multivitamin during Xeloda administration, and it can be restarted at the completion of therapy.  Prescription has been e-scribed to the [facility] for benefits analysis and approval.  Oral Oncology Clinic will continue to follow for insurance authorization, copayment issues, initial counseling and start date.  Johny Drilling, PharmD, BCPS, BCOP  09/21/2018 2:44 PM Oral Oncology Clinic [phone]

## 2018-09-21 NOTE — Telephone Encounter (Signed)
Scheduled appt per 6/24 los. Spoke with patient daughter and she is aware of appt date and time.

## 2018-09-22 ENCOUNTER — Telehealth: Payer: Self-pay

## 2018-09-22 DIAGNOSIS — Z510 Encounter for antineoplastic radiation therapy: Secondary | ICD-10-CM | POA: Diagnosis not present

## 2018-09-22 DIAGNOSIS — C20 Malignant neoplasm of rectum: Secondary | ICD-10-CM | POA: Diagnosis not present

## 2018-09-22 MED ORDER — ONDANSETRON HCL 8 MG PO TABS: 4.0000 mg | ORAL_TABLET | Freq: Three times a day (TID) | ORAL | 0 refills | Status: DC | PRN

## 2018-09-22 MED FILL — CAPECITABINE 500 MG TABS: 500 | 14 days supply | Qty: 60 | Fill #0

## 2018-09-22 MED FILL — ONDANSETRON HCL 8 MG TABLET: 8 | 10 days supply | Qty: 30 | Fill #0

## 2018-09-22 NOTE — Addendum Note (Signed)
Addended by: Truitt Merle on: 09/22/2018 09:55 AM   Modules accepted: Orders

## 2018-09-22 NOTE — Telephone Encounter (Signed)
Oral Chemotherapy Pharmacist Encounter   I spoke with patient's daughter, [name], for overview of: Xeloda (capecitabine) for the neoadjuvant treatment of stage II rectal cancer in conjunction with radiation, planned duration 28 treatment days over 38 calendar days.   Counseled on administration, dosing, side effects, monitoring, drug-food interactions, safe handling, storage, and disposal.  Patient will take Xeloda 500mg  tablets, 3 tablets (1500mg ) by mouth in AM and 3 tabs (1500mg ) by mouth in PM, within 30 minutes of finishing meals, on days of radiation only.  [name] states that her mom generally has an Ensure for breakfast, but they will try to add a piece of toast for increased toleration. [name] also notes that their meals are not at a consistent time, but they will be mindful to keep Xeloda doses 10-12 hours separated and will have food on the stomach for each administration.  Xeloda and radiation start date: 09/26/2018  Adverse effects of Xeloda include but are not limited to: fatigue, decreased blood counts, GI upset, diarrhea, mouth sores, and hand-foot syndrome.  Anti-emetic prescription has been sent to the pharmacy and they know to take it if nausea develops.   They will obtain anti diarrheal and alert the office of 4 or more loose stools above baseline.   Reviewed importance of keeping a medication schedule and plan for any missed doses.  Medication reconciliation performed and medication/allergy list updated. [name] will hold patient's multivitamin during Xeloda treatment and will resume it at the completion of therapy.  Insurance authorization for Xeloda was not required. Xeloda is being billed through patient's Medicare Part B coverage and [employer] is being used as secondary coverage. Test claim at the pharmacy revealed copayment $0 for 1st fill of Xeloda. [employer] does mandate that only a 2 week supply of Xeloda can be filled at a time, and [name] is aware that  patient will need 3 fills of Xeloda to complete entire course.  They will pick up the first fill of Xeloda and the prescription for ondansetron from the [place] on 09/22/2018.  [name] informed the pharmacy will reach out 5-7 days prior to needing next fill of Xeloda to coordinate continued medication acquisition to prevent break in therapy.  All questions answered.  [name] voiced understanding and appreciation.   They know to call the office with questions or concerns.  Johny Drilling, PharmD, BCPS, BCOP  09/22/2018  10:03 AM Oral Oncology Clinic [phone]

## 2018-09-22 NOTE — Telephone Encounter (Signed)
Called patient's daughter to advise of apt on 6/26 @ 10:45 AM for CT scan of chest at [facility] Radiology

## 2018-09-22 NOTE — Telephone Encounter (Signed)
Spoke with Director of radiology. Patient's daughter is allowed to accompany patient to radiology for CT chest in AM.

## 2018-09-23 ENCOUNTER — Ambulatory Visit (HOSPITAL_COMMUNITY)
Admission: RE | Admit: 2018-09-23 | Discharge: 2018-09-23 | Disposition: A | Discharge status: Home / Self Care | Payer: Medicare Other | Source: Ambulatory Visit | Attending: Hematology | Admitting: Hematology

## 2018-09-23 ENCOUNTER — Other Ambulatory Visit: Payer: Self-pay

## 2018-09-23 DIAGNOSIS — J984 Other disorders of lung: Secondary | ICD-10-CM | POA: Diagnosis not present

## 2018-09-23 DIAGNOSIS — C20 Malignant neoplasm of rectum: Secondary | ICD-10-CM

## 2018-09-23 NOTE — Telephone Encounter (Signed)
Oral Oncology Patient Advocate Encounter  Confirmed with [facility] that Xeloda was picked up on 09/22/18 with a $0 copay.   [place] Patient [place] Phone [phone] Fax [phone] 09/23/2018   9:08 AM

## 2018-09-26 ENCOUNTER — Other Ambulatory Visit: Payer: Self-pay

## 2018-09-26 ENCOUNTER — Ambulatory Visit
Admission: RE | Admit: 2018-09-26 | Discharge: 2018-09-26 | Disposition: A | Discharge status: Home / Self Care | Payer: Medicare Other | Source: Ambulatory Visit | Attending: Radiation Oncology | Admitting: Radiation Oncology

## 2018-09-26 ENCOUNTER — Inpatient Hospital Stay (HOSPITAL_BASED_OUTPATIENT_CLINIC_OR_DEPARTMENT_OTHER): Payer: Medicare Other | Admitting: Nurse Practitioner

## 2018-09-26 ENCOUNTER — Encounter: Payer: Self-pay | Admitting: Nurse Practitioner

## 2018-09-26 ENCOUNTER — Telehealth: Payer: Self-pay | Admitting: Nurse Practitioner

## 2018-09-26 ENCOUNTER — Other Ambulatory Visit: Payer: Medicare Other

## 2018-09-26 VITALS — BP 125/62 | HR 57 | Temp 98.9°F | Resp 17 | Ht 67.0 in | Wt 143.0 lb

## 2018-09-26 DIAGNOSIS — I2510 Atherosclerotic heart disease of native coronary artery without angina pectoris: Secondary | ICD-10-CM | POA: Diagnosis not present

## 2018-09-26 DIAGNOSIS — F419 Anxiety disorder, unspecified: Secondary | ICD-10-CM

## 2018-09-26 DIAGNOSIS — I110 Hypertensive heart disease with heart failure: Secondary | ICD-10-CM

## 2018-09-26 DIAGNOSIS — F329 Major depressive disorder, single episode, unspecified: Secondary | ICD-10-CM

## 2018-09-26 DIAGNOSIS — Z8673 Personal history of transient ischemic attack (TIA), and cerebral infarction without residual deficits: Secondary | ICD-10-CM

## 2018-09-26 DIAGNOSIS — C20 Malignant neoplasm of rectum: Secondary | ICD-10-CM

## 2018-09-26 DIAGNOSIS — Z510 Encounter for antineoplastic radiation therapy: Secondary | ICD-10-CM | POA: Diagnosis not present

## 2018-09-26 NOTE — Progress Notes (Addendum)
Megan Munoz   [phone] [phone]   Clinic Follow up Note   Patient Care Team: Fanny Bien, MD as PCP - General (Family Medicine) Leonie Man, MD as PCP - Cardiology (Cardiology) Ronnette Juniper, MD as Consulting Physician (Gastroenterology) Arna Snipe, RN as Oncology Nurse Navigator Michael Boston, MD as Consulting Physician (General Surgery) Truitt Merle, MD as Consulting Physician (Hematology) Kyung Rudd, MD as Consulting Physician (Radiation Oncology) 09/26/2018  CHIEF COMPLAINT: Rectal cancer  SUMMARY OF ONCOLOGIC HISTORY: Oncology History  Rectal cancer ([place])  06/01/2018 Imaging   COLONOSCOPY - Firm, nodular, thickened, raised area felt at the anal verge found on perianal exam. 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 Biopsied. - Two 6 to 8 mm polyps in the transverse colon, removed with a hot snare. Resected and retrieved. - One 7 mm polyp in the ascending colon, removed with a hot snare. Resected and retrieved. - One 6 mm polyp in the cecum, removed with a hot snare. Resected and retrieved. - One 4 mm polyp at the hepatic flexure, removed with a cold biopsy forceps. Resected and retrieved. - One 9 mm polyp in the descending colon, removed with a hot snare. Resected and retrieved. - Diverticulosis in the sigmoid colon and in the descending colon.   06/01/2018 Initial Biopsy   Pathology from colonoscopy:  1. All colon polyps negative for malignancy. 2. Tubulovillous adenoma with at least high grade dysplasia at the rectal tumor. There were several foci highly suspicious but not definitive for invasive adenocarcinoma.    09/03/2018 Imaging   PELVIC MRI IMPRESSION: 1. 3.0 cm polypoid mass along the right anterolateral aspect of the anal verge, as described above. 2. Given the location, it is unclear whether this is more appropriately characterized as rectal cancer versus anal cancer. Regardless, given the clinical information, rectal  cancer imaging characterization has been provided. 3. Rectal adenocarcinoma T stage: T3d 4. Rectal adenocarcinoma N stage:  N0 5. Distance from tumor to the internal anal sphincter is 0 cm.   09/19/2018 Miscellaneous   TUMOR BOARD: concurrent radiation therapy and chemotherapy with Xeloda   09/21/2018 Initial Diagnosis   Rectal cancer ([place])   09/21/2018 Cancer Staging   Staging form: Colon and Rectum, AJCC 8th Edition - Clinical: Stage IIA (cT3, cN0, cM0) - Signed by Truitt Merle, MD on 09/21/2018    Chemotherapy   ChemoRT with Xeloda beginning on 09/26/18, Xeloda 1500mg  q12h on days of radiation     Radiation Therapy   ChemoRT with Xeloda beginning on 09/26/18 by Dr Lisbeth Renshaw      CURRENT THERAPY: chemoRT with Xeloda 1500 mg BID on M-F days with radiation starting 09/26/18   INTERVAL HISTORY: [name] presents with her daughter Maudie Mercury for f/u and treatment. She began Xeloda this morning. She feels OK in general, nervous to begin treatment. Appetite is normal for her. Drinks Ensure. She is mostly independent of ADLs. Uses walker. Continues to note brown rectal discharge with small amount of blood with BM on toilet tissue. Has 3-5 BM per day in small, formed amounts. Denies rectal pain. Denies n/v. Denies dysuria. Denies fever, chills, cough, chest pain, dyspnea. Has mild leg edema which is not new for her. On lasix.     MEDICAL HISTORY:  Past Medical History:  Diagnosis Date  . Anxiety   . Atherosclerosis of aortic arch [facility]) April 2017   Noted on CT chest, calcified aortic atherosclerosis at the arch.  . Atrial fibrillation, permanent 03/2013  Previously on Atenolol for Rate - now on combination diltiazem and carvedilol for rate Control;  & Xarelto for AC. CHADS2VASC2 = 7.  . Cerebral infarction [facility]) 10/2008   MRI Brain 05/2014 for near syncope revealed: No acure infarct.  Moderate to large remote left frontal lobe infarct with encephalomalacia and dilation of the frontal horn of the left  lateral ventricle.   Mild small vessel disease type changes.  No intracranial hemorrhage.  Global atrophy.   . CHF (congestive heart failure) ([place])   . COPD (chronic obstructive pulmonary disease) ([place])   . Coronary atherosclerosis due to calcified coronary lesion April 2017   Noted on CT chest  . Dementia ([place])    following 2010 stroke  . Hyperlipemia   . Hypertension   . Pneumonia   . PONV (postoperative nausea and vomiting)   . Stroke [facility])    2010- patient has dementia related to stroke   . Tinea unguium   . Tremor   . Tuberculosis     SURGICAL HISTORY: Past Surgical History:  Procedure Laterality Date  . BIOPSY  06/01/2018   Procedure: BIOPSY;  Surgeon: Ronnette Juniper, MD;  Location: [facility];  Service: Gastroenterology;;  . BLADDER SURGERY    . CARPAL TUNNEL RELEASE Left   . COLONOSCOPY N/A 06/01/2018   Procedure: COLONOSCOPY;  Surgeon: Ronnette Juniper, MD;  Location: [facility];  Service: Gastroenterology;  Laterality: N/A;  . ESOPHAGOGASTRODUODENOSCOPY (EGD) WITH PROPOFOL N/A 02/04/2018   Procedure: ESOPHAGOGASTRODUODENOSCOPY (EGD) WITH PROPOFOL;  Surgeon: Ronnette Juniper, MD;  Location: [place];  Service: Gastroenterology;  Laterality: N/A;  . FOOT SURGERY Right   . HERNIA REPAIR    . NASAL SINUS SURGERY    . POLYPECTOMY  06/01/2018   Procedure: POLYPECTOMY;  Surgeon: Ronnette Juniper, MD;  Location: [facility];  Service: Gastroenterology;;  . TOTAL ABDOMINAL HYSTERECTOMY      I have reviewed the social history and family history with the patient and they are unchanged from previous note.  ALLERGIES:  is allergic to tetanus toxoids.  MEDICATIONS:  Current Outpatient Medications  Medication Sig Dispense Refill  . albuterol (PROVENTIL) (2.5 MG/3ML) 0.083% nebulizer solution Take 3 mLs (2.5 mg total) by nebulization every 6 (six) hours as needed for wheezing or shortness of breath. 30 vial 1  . Azelastine HCl 137 MCG/SPRAY SOLN Place 2 sprays into both nostrils 2 (two) times  daily. Use in each nostril as directed     . B Complex Vitamins (VITAMIN B COMPLEX PO) Take 1 tablet by mouth daily.     Marland Kitchen BISACODYL 5 MG EC tablet TK AS DIRECTED    . calcitonin, salmon, (MIACALCIN) 200 UNIT/ACT nasal spray Place 1 spray into alternate nostrils daily.    . capecitabine (XELODA) 500 MG tablet Take 3 tablets (1,500 mg total) by mouth 2 (two) times daily after a meal. Take on days of radiation, Monday through Friday 168 tablet 0  . Cholecalciferol (VITAMIN D3 SUPER STRENGTH) 2000 units CAPS Take 2,000 Units by mouth daily.     Marland Kitchen diltiazem (CARDIZEM CD) 120 MG 24 hr capsule TAKE 3 CAPSULES DAILY (PLEASE SCHEDULE AN APPOINTMENT FOR FURTHER REFILLS) (Patient taking differently: Take 360 mg by mouth daily. ) 360 capsule 4  . docusate sodium (COLACE) 100 MG capsule Take 100-200 mg by mouth 2 (two) times daily as needed for mild constipation. Pt does take at least 100 mg daily    . donepezil (ARICEPT) 10 MG tablet Take 1 tablet (10 mg total) by mouth  at bedtime. 90 tablet 3  . feeding supplement, ENSURE ENLIVE, (ENSURE ENLIVE) LIQD Take 237 mLs by mouth 3 (three) times daily between meals. (Patient taking differently: Take 237 mLs by mouth See admin instructions. 2-3  Times daily) 237 mL 12  . ferrous sulfate 325 (65 FE) MG tablet Take 1 tablet (325 mg total) by mouth every other day. 30 tablet 0  . FLUoxetine (PROZAC) 40 MG capsule Take 40 mg by mouth daily.     . Fluticasone-Umeclidin-Vilant (TRELEGY ELLIPTA) 100-62.5-25 MCG/INH AEPB Inhale 1 puff into the lungs daily.    . furosemide (LASIX) 20 MG tablet Take 1 tablet (20 mg total) by mouth daily as needed for fluid or edema. (Patient taking differently: Take 20 mg by mouth every other day. ) 30 tablet 6  . loratadine (CLARITIN) 10 MG tablet Take 10 mg by mouth daily.    . Melatonin 10 MG TABS Take 10 mg by mouth at bedtime.     . memantine (NAMENDA) 10 MG tablet Take 1 tablet (10 mg total) by mouth 2 (two) times daily. 180 tablet 3  .  ondansetron (ZOFRAN) 8 MG tablet Take 0.5-1 tablets (4-8 mg total) by mouth every 8 (eight) hours as needed for nausea or vomiting. 30 tablet 0  . rivaroxaban (XARELTO) 10 MG TABS tablet Take 10 mg by mouth daily.    . simvastatin (ZOCOR) 40 MG tablet Take 40 mg by mouth daily.    . carvedilol (COREG) 12.5 MG tablet TAKE 1 TABLET TWICE A DAY WITH MEALS (DUE FOR FOLLOW UP APPOINTMENT IN [place]) (Patient not taking: Reported on 09/26/2018) 180 tablet 4  . Multiple Vitamin (MULTIVITAMIN) capsule Take 1 capsule by mouth daily.     No current facility-administered medications for this visit.     PHYSICAL EXAMINATION: ECOG PERFORMANCE STATUS: 1 - Symptomatic but completely ambulatory  Vitals:   09/26/18 1428  BP: 125/62  Pulse: (!) 57  Resp: 17  Temp: 98.9 F (37.2 C)  SpO2: 98%   Filed Weights   09/26/18 1428  Weight: 143 lb (64.9 kg)    GENERAL:alert, no distress and comfortable SKIN: no obvious rash EYES: sclera clear LUNGS: clear to auscultation with normal breathing effort HEART: regular rate & rhythm, mild bilateral lower extremity edema ABDOMEN:abdomen soft, non-tender and normal bowel sounds Musculoskeletal:no cyanosis of digits  NEURO: alert & oriented x 3 with fluent speech, ambulates independently  Rectal exam: external exam reveals no anal mass   LABORATORY DATA:  I have reviewed the data as listed CBC Latest Ref Rng & Units 09/27/2018 06/13/2018 02/07/2018  WBC 4.0 - 10.5 K/uL 4.9 6.0 8.6  Hemoglobin 12.0 - 15.0 g/dL 11.6(L) 12.2 10.4(L)  Hematocrit 36.0 - 46.0 % 37.0 40.0 33.5(L)  Platelets 150 - 400 K/uL 190 234 262     CMP Latest Ref Rng & Units 09/27/2018 08/17/2018 06/13/2018  Glucose 70 - 99 mg/dL 93 - 132(H)  BUN 8 - 23 mg/dL 22 - 20  Creatinine 0.44 - 1.00 mg/dL 0.75 0.68 0.71  Sodium 135 - 145 mmol/L 143 - 141  Potassium 3.5 - 5.1 mmol/L 4.1 - 5.1  Chloride 98 - 111 mmol/L 106 - 108  CO2 22 - 32 mmol/L 30 - 25  Calcium 8.9 - 10.3 mg/dL 9.1 - 9.2  Total  Protein 6.5 - 8.1 g/dL 6.9 - 6.5  Total Bilirubin 0.3 - 1.2 mg/dL 0.4 - 0.3  Alkaline Phos 38 - 126 U/L 93 - 76  AST 15 - 41  U/L 20 - 34  ALT 0 - 44 U/L 13 - 27      RADIOGRAPHIC STUDIES: I have personally reviewed the radiological images as listed and agreed with the findings in the report. No results found.   ASSESSMENT & PLAN: 85 yo female with   1. Distal rectal adenocarcinoma, cT3N0M0, stage IIA -Ms. Hosek appears well. She is accompanied by her daughter, Maudie Mercury.  -I reviewed her CT chest from 09/23/18 is negative for metastasis.  -She began concurrent chemoRT with Xeloda this morning. She will begin RT immediately after today's visit. We reviewed Xeloda dosing, potential side effects, and management. Her daughter Maudie Mercury is aware as well.  -The patient was seen with Dr. Burr Medico.  -Due to her age and covid19 outbreak, will do some virtual f/u rather than in person. Will change next week f/u to phone visit. We will see her more in person as she progresses through radiation. She knows to seek in person eval with new concerns at any time. She will see Dr. Lisbeth Renshaw weekly. Patient and daughter agree.  -will change lab appointment to 09/17/18; we explained importance of checking renal function on Xeloda and daughter understands to make sure she is adequately hydrated.  2. HTN, CHF - on cardizem, lasix 20 mg daily   3. H/O stroke, dementia  -On Xarelto 10 mg daily, Aricept, Namenda; followed by Dr. Krista Blue Neuro  4. Depression, anxiety  -On Prozac 40 mg daily   PLAN: -chemoRT starting today -Xeloda 1500 mg BID M-F on days with radiation -Lab 6/30 and weekly on chemoRT -Phone visit in 1 week  All questions were answered. The patient knows to call the clinic with any problems, questions or concerns. No barriers to learning was detected.     Alla Feeling, NP 09/26/18   Addendum  I have seen the patient, examined her. I agree with the assessment and and plan and have edited the notes.   Ms  Monter presents to start concurrent chemoRT for her stage IIA rectal cancer. I again reviewed benefit and side effects of Xeloda, pt and her daughter agree to proceed. Lab reviewed, adequate for treatment. Will follow up with weekly lab and alternate office and virtual visit with [place]. Her daughter knows to call us if she has any concerns.  Truitt Merle  09/26/2018

## 2018-09-26 NOTE — Telephone Encounter (Signed)
Scheduled appt per 6/29 sch messa e- pt daughter is aware.

## 2018-09-27 ENCOUNTER — Inpatient Hospital Stay: Payer: Medicare Other

## 2018-09-27 ENCOUNTER — Ambulatory Visit
Admission: RE | Admit: 2018-09-27 | Discharge: 2018-09-27 | Disposition: A | Discharge status: Home / Self Care | Payer: Medicare Other | Source: Ambulatory Visit | Attending: Radiation Oncology | Admitting: Radiation Oncology

## 2018-09-27 ENCOUNTER — Telehealth: Payer: Self-pay | Admitting: Nurse Practitioner

## 2018-09-27 DIAGNOSIS — C20 Malignant neoplasm of rectum: Secondary | ICD-10-CM | POA: Diagnosis not present

## 2018-09-27 DIAGNOSIS — I110 Hypertensive heart disease with heart failure: Secondary | ICD-10-CM | POA: Diagnosis not present

## 2018-09-27 DIAGNOSIS — Z510 Encounter for antineoplastic radiation therapy: Secondary | ICD-10-CM | POA: Diagnosis not present

## 2018-09-27 DIAGNOSIS — F329 Major depressive disorder, single episode, unspecified: Secondary | ICD-10-CM | POA: Diagnosis not present

## 2018-09-27 DIAGNOSIS — Z8673 Personal history of transient ischemic attack (TIA), and cerebral infarction without residual deficits: Secondary | ICD-10-CM | POA: Diagnosis not present

## 2018-09-27 DIAGNOSIS — F419 Anxiety disorder, unspecified: Secondary | ICD-10-CM | POA: Diagnosis not present

## 2018-09-27 DIAGNOSIS — I2510 Atherosclerotic heart disease of native coronary artery without angina pectoris: Secondary | ICD-10-CM | POA: Diagnosis not present

## 2018-09-27 LAB — CBC WITH DIFFERENTIAL (CANCER CENTER ONLY)
Abs Immature Granulocytes: 0.01 10*3/uL (ref 0.00–0.07)
Basophils Absolute: 0 10*3/uL (ref 0.0–0.1)
Basophils Relative: 0 %
Eosinophils Absolute: 0 10*3/uL (ref 0.0–0.5)
Eosinophils Relative: 0 %
HCT: 37 % (ref 36.0–46.0)
Hemoglobin: 11.6 g/dL — ABNORMAL LOW (ref 12.0–15.0)
Immature Granulocytes: 0 %
Lymphocytes Relative: 19 %
Lymphs Abs: 0.9 10*3/uL (ref 0.7–4.0)
MCH: 29.8 pg (ref 26.0–34.0)
MCHC: 31.4 g/dL (ref 30.0–36.0)
MCV: 95.1 fL (ref 80.0–100.0)
Monocytes Absolute: 0.3 10*3/uL (ref 0.1–1.0)
Monocytes Relative: 6 %
Neutro Abs: 3.6 10*3/uL (ref 1.7–7.7)
Neutrophils Relative %: 75 %
Platelet Count: 190 10*3/uL (ref 150–400)
RBC: 3.89 MIL/uL (ref 3.87–5.11)
RDW: 13.4 % (ref 11.5–15.5)
WBC Count: 4.9 10*3/uL (ref 4.0–10.5)
nRBC: 0 % (ref 0.0–0.2)

## 2018-09-27 LAB — CMP (CANCER CENTER ONLY)
ALT: 13 U/L (ref 0–44)
AST: 20 U/L (ref 15–41)
Albumin: 3.6 g/dL (ref 3.5–5.0)
Alkaline Phosphatase: 93 U/L (ref 38–126)
Anion gap: 7 (ref 5–15)
BUN: 22 mg/dL (ref 8–23)
CO2: 30 mmol/L (ref 22–32)
Calcium: 9.1 mg/dL (ref 8.9–10.3)
Chloride: 106 mmol/L (ref 98–111)
Creatinine: 0.75 mg/dL (ref 0.44–1.00)
GFR, Est AFR Am: >60 mL/min (ref >60)
GFR, Estimated: >60 mL/min (ref >60)
Glucose, Bld: 93 mg/dL (ref 70–99)
Potassium: 4.1 mmol/L (ref 3.5–5.1)
Sodium: 143 mmol/L (ref 135–145)
Total Bilirubin: 0.4 mg/dL (ref 0.3–1.2)
Total Protein: 6.9 g/dL (ref 6.5–8.1)

## 2018-09-27 NOTE — Telephone Encounter (Signed)
Scheduled appt per 6/29 los. Spoke with patient daughter and she is aware of the new lab appt time.

## 2018-09-28 ENCOUNTER — Ambulatory Visit
Admission: RE | Admit: 2018-09-28 | Discharge: 2018-09-28 | Disposition: A | Discharge status: Home / Self Care | Payer: Medicare Other | Source: Ambulatory Visit | Attending: Radiation Oncology | Admitting: Radiation Oncology

## 2018-09-28 DIAGNOSIS — C20 Malignant neoplasm of rectum: Secondary | ICD-10-CM | POA: Diagnosis not present

## 2018-09-28 DIAGNOSIS — Z510 Encounter for antineoplastic radiation therapy: Secondary | ICD-10-CM | POA: Insufficient documentation

## 2018-09-28 NOTE — Addendum Note (Signed)
Encounter addended by: Hayden Pedro, PA-C on: 09/28/2018 9:01 AM  Actions taken: Clinical Note Signed

## 2018-09-28 NOTE — Progress Notes (Signed)
Pt here for patient teaching.  Pt given Radiation and You booklet.  Reviewed areas of pertinence such as diarrhea, fatigue, hair loss, nausea and vomiting, sexual and fertility changes, skin changes and urinary and bladder changes . Pt able to give teach back of to pat skin, use unscented/gentle soap, use baby wipes, have Imodium on hand, drink plenty of water and sitz bath,avoid applying anything to skin within 4 hours of treatment. Pt verbalizes understanding of information given and will contact nursing with any questions or concerns.     Brynli Ollis M. Heylee Tant RN, BSN      

## 2018-09-29 ENCOUNTER — Ambulatory Visit
Admission: RE | Admit: 2018-09-29 | Discharge: 2018-09-29 | Disposition: A | Discharge status: Home / Self Care | Payer: Medicare Other | Source: Ambulatory Visit | Attending: Radiation Oncology | Admitting: Radiation Oncology

## 2018-09-29 DIAGNOSIS — C20 Malignant neoplasm of rectum: Secondary | ICD-10-CM | POA: Diagnosis not present

## 2018-09-29 DIAGNOSIS — Z510 Encounter for antineoplastic radiation therapy: Secondary | ICD-10-CM | POA: Diagnosis not present

## 2018-10-03 ENCOUNTER — Inpatient Hospital Stay: Payer: Medicare Other | Attending: Nurse Practitioner | Admitting: Nurse Practitioner

## 2018-10-03 ENCOUNTER — Other Ambulatory Visit: Payer: Medicare Other

## 2018-10-03 ENCOUNTER — Ambulatory Visit
Admission: RE | Admit: 2018-10-03 | Discharge: 2018-10-03 | Disposition: A | Discharge status: Home / Self Care | Payer: Medicare Other | Source: Ambulatory Visit | Attending: Radiation Oncology | Admitting: Radiation Oncology

## 2018-10-03 ENCOUNTER — Encounter: Payer: Self-pay | Admitting: Nurse Practitioner

## 2018-10-03 ENCOUNTER — Other Ambulatory Visit: Payer: Self-pay

## 2018-10-03 ENCOUNTER — Inpatient Hospital Stay: Payer: Medicare Other

## 2018-10-03 DIAGNOSIS — I110 Hypertensive heart disease with heart failure: Secondary | ICD-10-CM | POA: Diagnosis not present

## 2018-10-03 DIAGNOSIS — E860 Dehydration: Secondary | ICD-10-CM | POA: Diagnosis not present

## 2018-10-03 DIAGNOSIS — J449 Chronic obstructive pulmonary disease, unspecified: Secondary | ICD-10-CM | POA: Diagnosis not present

## 2018-10-03 DIAGNOSIS — I2510 Atherosclerotic heart disease of native coronary artery without angina pectoris: Secondary | ICD-10-CM | POA: Diagnosis not present

## 2018-10-03 DIAGNOSIS — I10 Essential (primary) hypertension: Secondary | ICD-10-CM | POA: Diagnosis not present

## 2018-10-03 DIAGNOSIS — F329 Major depressive disorder, single episode, unspecified: Secondary | ICD-10-CM | POA: Insufficient documentation

## 2018-10-03 DIAGNOSIS — Z510 Encounter for antineoplastic radiation therapy: Secondary | ICD-10-CM | POA: Diagnosis not present

## 2018-10-03 DIAGNOSIS — Z79899 Other long term (current) drug therapy: Secondary | ICD-10-CM | POA: Diagnosis not present

## 2018-10-03 DIAGNOSIS — I4891 Unspecified atrial fibrillation: Secondary | ICD-10-CM | POA: Insufficient documentation

## 2018-10-03 DIAGNOSIS — F419 Anxiety disorder, unspecified: Secondary | ICD-10-CM | POA: Insufficient documentation

## 2018-10-03 DIAGNOSIS — Z8673 Personal history of transient ischemic attack (TIA), and cerebral infarction without residual deficits: Secondary | ICD-10-CM | POA: Insufficient documentation

## 2018-10-03 DIAGNOSIS — R6883 Chills (without fever): Secondary | ICD-10-CM | POA: Diagnosis not present

## 2018-10-03 DIAGNOSIS — E876 Hypokalemia: Secondary | ICD-10-CM | POA: Insufficient documentation

## 2018-10-03 DIAGNOSIS — F418 Other specified anxiety disorders: Secondary | ICD-10-CM

## 2018-10-03 DIAGNOSIS — C20 Malignant neoplasm of rectum: Secondary | ICD-10-CM

## 2018-10-03 DIAGNOSIS — Z7901 Long term (current) use of anticoagulants: Secondary | ICD-10-CM | POA: Insufficient documentation

## 2018-10-03 LAB — CMP (CANCER CENTER ONLY)
ALT: 15 U/L (ref 0–44)
AST: 23 U/L (ref 15–41)
Albumin: 3.5 g/dL (ref 3.5–5.0)
Alkaline Phosphatase: 94 U/L (ref 38–126)
Anion gap: 9 (ref 5–15)
BUN: 17 mg/dL (ref 8–23)
CO2: 26 mmol/L (ref 22–32)
Calcium: 8.7 mg/dL — ABNORMAL LOW (ref 8.9–10.3)
Chloride: 108 mmol/L (ref 98–111)
Creatinine: 0.75 mg/dL (ref 0.44–1.00)
GFR, Est AFR Am: >60 mL/min (ref >60)
GFR, Estimated: >60 mL/min (ref >60)
Glucose, Bld: 101 mg/dL — ABNORMAL HIGH (ref 70–99)
Potassium: 3.9 mmol/L (ref 3.5–5.1)
Sodium: 143 mmol/L (ref 135–145)
Total Bilirubin: 0.4 mg/dL (ref 0.3–1.2)
Total Protein: 6.7 g/dL (ref 6.5–8.1)

## 2018-10-03 LAB — CBC WITH DIFFERENTIAL (CANCER CENTER ONLY)
Abs Immature Granulocytes: 0.05 10*3/uL (ref 0.00–0.07)
Basophils Absolute: 0 10*3/uL (ref 0.0–0.1)
Basophils Relative: 0 %
Eosinophils Absolute: 0 10*3/uL (ref 0.0–0.5)
Eosinophils Relative: 0 %
HCT: 35.9 % — ABNORMAL LOW (ref 36.0–46.0)
Hemoglobin: 11.7 g/dL — ABNORMAL LOW (ref 12.0–15.0)
Immature Granulocytes: 2 %
Lymphocytes Relative: 28 %
Lymphs Abs: 1 10*3/uL (ref 0.7–4.0)
MCH: 30.2 pg (ref 26.0–34.0)
MCHC: 32.6 g/dL (ref 30.0–36.0)
MCV: 92.5 fL (ref 80.0–100.0)
Monocytes Absolute: 0.2 10*3/uL (ref 0.1–1.0)
Monocytes Relative: 5 %
Neutro Abs: 2.2 10*3/uL (ref 1.7–7.7)
Neutrophils Relative %: 65 %
Platelet Count: 169 10*3/uL (ref 150–400)
RBC: 3.88 MIL/uL (ref 3.87–5.11)
RDW: 13.2 % (ref 11.5–15.5)
WBC Count: 3.4 10*3/uL — ABNORMAL LOW (ref 4.0–10.5)
nRBC: 0 % (ref 0.0–0.2)

## 2018-10-03 NOTE — Progress Notes (Signed)
[place]   Telephone:(336) [phone] Fax:(336) [phone]   Clinic Follow up Note   Patient Care Team: Megan Bien, MD as PCP - General (Family Medicine) Megan Man, MD as PCP - Cardiology (Cardiology) Megan Juniper, MD as Consulting Physician (Gastroenterology) Megan Snipe, RN as Oncology Nurse Navigator Megan Boston, MD as Consulting Physician (General Surgery) Megan Merle, MD as Consulting Physician (Hematology) Megan Rudd, MD as Consulting Physician (Radiation Oncology) 10/03/2018  I connected with Megan Munoz on 10/03/18 at 3:37 PM EDT by telephone visit and verified that I am speaking with the correct person using two identifiers.   I discussed the limitations, risks, security and privacy concerns of performing an evaluation and management service by telemedicine and the availability of in-person appointments. I also discussed with the patient that there may be a patient responsible charge related to this service. The patient expressed understanding and agreed to proceed.   Other persons participating in the visit and their role in the encounter: Megan Munoz, daughter and caregiver  Patient's location: home Provider's location: [place] office   CHIEF COMPLAINT: f/u rectal cancer   SUMMARY OF ONCOLOGIC HISTORY: Oncology History  Rectal cancer ([place])  06/01/2018 Imaging   COLONOSCOPY - Firm, nodular, thickened, raised area felt at the anal verge found on perianal exam. 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 Biopsied. - Two 6 to 8 mm polyps in the transverse colon, removed with a hot snare. Resected and retrieved. - One 7 mm polyp in the ascending colon, removed with a hot snare. Resected and retrieved. - One 6 mm polyp in the cecum, removed with a hot snare. Resected and retrieved. - One 4 mm polyp at the hepatic flexure, removed with a cold biopsy forceps. Resected and retrieved. - One 9 mm polyp in the descending colon, removed with a hot snare.  Resected and retrieved. - Diverticulosis in the sigmoid colon and in the descending colon.   06/01/2018 Initial Biopsy   Pathology from colonoscopy:  1. All colon polyps negative for malignancy. 2. Tubulovillous adenoma with at least high grade dysplasia at the rectal tumor. There were several foci highly suspicious but not definitive for invasive adenocarcinoma.    09/03/2018 Imaging   PELVIC MRI IMPRESSION: 1. 3.0 cm polypoid mass along the right anterolateral aspect of the anal verge, as described above. 2. Given the location, it is unclear whether this is more appropriately characterized as rectal cancer versus anal cancer. Regardless, given the clinical information, rectal cancer imaging characterization has been provided. 3. Rectal adenocarcinoma T stage: T3d 4. Rectal adenocarcinoma N stage:  N0 5. Distance from tumor to the internal anal sphincter is 0 cm.   09/19/2018 Miscellaneous   TUMOR BOARD: concurrent radiation therapy and chemotherapy with Xeloda   09/21/2018 Initial Diagnosis   Rectal cancer ([place])   09/21/2018 Cancer Staging   Staging form: Colon and Rectum, AJCC 8th Edition - Clinical: Stage IIA (cT3, cN0, cM0) - Signed by Megan Merle, MD on 09/21/2018    Chemotherapy   ChemoRT with Xeloda beginning on 09/26/18, Xeloda 1500mg  q12h on days of radiation     Radiation Therapy   ChemoRT with Xeloda beginning on 09/26/18 by Dr Megan Munoz      CURRENT THERAPY: chemoRT with Xeloda 1500 mg BID on M-F days with radiation starting 09/26/18   INTERVAL HISTORY: Megan Munoz was reached by telephone for today's visit with her daughter Megan Munoz. She reports "doing OK, not good but not bad." She is more fatigued this  week, didn't want to get out of bed today, but still remains functional and has been outside walking. She is eating and drinking, Megan Munoz is keeping a close eye on signs of decreased appetite. She denies mucositis. Stools have become lose, but frequency is at baseline; still has 3-5 BM  per day. Has not started imodium. Denies GI bleeding. Denies rectal or other pain. Megan Munoz palms are "little pink," but patient denies sensitivity, cracks, or blisters.    MEDICAL HISTORY:  Past Medical History:  Diagnosis Date  . Anxiety   . Atherosclerosis of aortic arch [facility]) April 2017   Noted on CT chest, calcified aortic atherosclerosis at the arch.  . Atrial fibrillation, permanent 03/2013   Previously on Atenolol for Rate - now on combination diltiazem and carvedilol for rate Control;  & Xarelto for AC. CHADS2VASC2 = 7.  . Cerebral infarction [facility]) 10/2008   MRI Brain 05/2014 for near syncope revealed: No acure infarct.  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   Mild small vessel disease type changes.  No intracranial hemorrhage.  Global atrophy.   . CHF (congestive heart failure) ([place])   . COPD (chronic obstructive pulmonary disease) ([place])   . Coronary atherosclerosis due to calcified coronary lesion April 2017   Noted on CT chest  . Dementia ([place])    following 2010 stroke  . Hyperlipemia   . Hypertension   . Pneumonia   . PONV (postoperative nausea and vomiting)   . Stroke [facility])    2010- patient has dementia related to stroke   . Tinea unguium   . Tremor   . Tuberculosis     SURGICAL HISTORY: Past Surgical History:  Procedure Laterality Date  . BIOPSY  06/01/2018   Procedure: BIOPSY;  Surgeon: Megan Juniper, MD;  [facility];  Service: Gastroenterology;;  . BLADDER SURGERY    . CARPAL TUNNEL RELEASE Left   . COLONOSCOPY N/A 06/01/2018   Procedure: COLONOSCOPY;  Surgeon: Megan Juniper, MD;  [facility];  Service: Gastroenterology;  Laterality: N/A;  . ESOPHAGOGASTRODUODENOSCOPY (EGD) WITH PROPOFOL N/A 02/04/2018   Procedure: ESOPHAGOGASTRODUODENOSCOPY (EGD) WITH PROPOFOL;  Surgeon: Megan Juniper, MD;  Location: [place];  Service: Gastroenterology;   Laterality: N/A;  . FOOT SURGERY Right   . HERNIA REPAIR    . NASAL SINUS SURGERY    . POLYPECTOMY  06/01/2018   Procedure: POLYPECTOMY;  Surgeon: Megan Juniper, MD;  [facility];  Service: Gastroenterology;;  . TOTAL ABDOMINAL HYSTERECTOMY      I have reviewed the social history and family history with the patient and they are unchanged from previous note.  ALLERGIES:  is allergic to tetanus toxoids.  MEDICATIONS:  Current Outpatient Medications  Medication Sig Dispense Refill  . albuterol (PROVENTIL) (2.5 MG/3ML) 0.083% nebulizer solution Take 3 mLs (2.5 mg total) by nebulization every 6 (six) hours as needed for wheezing or shortness of breath. 30 vial 1  . Azelastine HCl 137 MCG/SPRAY SOLN Place 2 sprays into both nostrils 2 (two) times daily. Use in each nostril as directed     . B Complex Vitamins (VITAMIN B COMPLEX PO) Take 1 tablet by mouth daily.     Marland Kitchen BISACODYL 5 MG EC tablet TK AS DIRECTED    . calcitonin, salmon, (MIACALCIN) 200 UNIT/ACT nasal spray Place 1 spray into alternate nostrils daily.    . capecitabine (XELODA) 500 MG tablet Take 3 tablets (1,500  mg total) by mouth 2 (two) times daily after a meal. Take on days of radiation, Monday through Friday 168 tablet 0  . carvedilol (COREG) 12.5 MG tablet TAKE 1 TABLET TWICE A DAY WITH MEALS (DUE FOR FOLLOW UP APPOINTMENT IN [place]) (Patient not taking: Reported on 09/26/2018) 180 tablet 4  . Cholecalciferol (VITAMIN D3 SUPER STRENGTH) 2000 units CAPS Take 2,000 Units by mouth daily.     Marland Kitchen diltiazem (CARDIZEM CD) 120 MG 24 hr capsule TAKE 3 CAPSULES DAILY (PLEASE SCHEDULE AN APPOINTMENT FOR FURTHER REFILLS) (Patient taking differently: Take 360 mg by mouth daily. ) 360 capsule 4  . docusate sodium (COLACE) 100 MG capsule Take 100-200 mg by mouth 2 (two) times daily as needed for mild constipation. Pt does take at least 100 mg daily    . donepezil (ARICEPT) 10 MG tablet Take 1 tablet (10 mg total) by mouth at bedtime. 90  tablet 3  . feeding supplement, ENSURE ENLIVE, (ENSURE ENLIVE) LIQD Take 237 mLs by mouth 3 (three) times daily between meals. (Patient taking differently: Take 237 mLs by mouth See admin instructions. 2-3  Times daily) 237 mL 12  . ferrous sulfate 325 (65 FE) MG tablet Take 1 tablet (325 mg total) by mouth every other day. 30 tablet 0  . FLUoxetine (PROZAC) 40 MG capsule Take 40 mg by mouth daily.     . Fluticasone-Umeclidin-Vilant (TRELEGY ELLIPTA) 100-62.5-25 MCG/INH AEPB Inhale 1 puff into the lungs daily.    . furosemide (LASIX) 20 MG tablet Take 1 tablet (20 mg total) by mouth daily as needed for fluid or edema. (Patient taking differently: Take 20 mg by mouth every other day. ) 30 tablet 6  . loratadine (CLARITIN) 10 MG tablet Take 10 mg by mouth daily.    . Melatonin 10 MG TABS Take 10 mg by mouth at bedtime.     . memantine (NAMENDA) 10 MG tablet Take 1 tablet (10 mg total) by mouth 2 (two) times daily. 180 tablet 3  . Multiple Vitamin (MULTIVITAMIN) capsule Take 1 capsule by mouth daily.    . ondansetron (ZOFRAN) 8 MG tablet Take 0.5-1 tablets (4-8 mg total) by mouth every 8 (eight) hours as needed for nausea or vomiting. 30 tablet 0  . rivaroxaban (XARELTO) 10 MG TABS tablet Take 10 mg by mouth daily.    . simvastatin (ZOCOR) 40 MG tablet Take 40 mg by mouth daily.     No current facility-administered medications for this visit.     PHYSICAL EXAMINATION: Patient appears well over the phone. Speech is intact, non-pressured    LABORATORY DATA:  I have reviewed the data as listed CBC Latest Ref Rng & Units 10/03/2018 09/27/2018 06/13/2018  WBC 4.0 - 10.5 K/uL 3.4(L) 4.9 6.0  Hemoglobin 12.0 - 15.0 g/dL 11.7(L) 11.6(L) 12.2  Hematocrit 36.0 - 46.0 % 35.9(L) 37.0 40.0  Platelets 150 - 400 K/uL 169 190 234     CMP Latest Ref Rng & Units 10/03/2018 09/27/2018 08/17/2018  Glucose 70 - 99 mg/dL 101(H) 93 -  BUN 8 - 23 mg/dL 17 22 -  Creatinine 0.44 - 1.00 mg/dL 0.75 0.75 0.68  Sodium 135  - 145 mmol/L 143 143 -  Potassium 3.5 - 5.1 mmol/L 3.9 4.1 -  Chloride 98 - 111 mmol/L 108 106 -  CO2 22 - 32 mmol/L 26 30 -  Calcium 8.9 - 10.3 mg/dL 8.7(L) 9.1 -  Total Protein 6.5 - 8.1 g/dL 6.7 6.9 -  Total Bilirubin 0.3 -  1.2 mg/dL 0.4 0.4 -  Alkaline Phos 38 - 126 U/L 94 93 -  AST 15 - 41 U/L 23 20 -  ALT 0 - 44 U/L 15 13 -      RADIOGRAPHIC STUDIES: I have personally reviewed the radiological images as listed and agreed with the findings in the report. No results found.   ASSESSMENT & PLAN: 85 yo female with   1. Distal rectal adenocarcinoma, cT3N0M0, stage IIA -Ms. Vary began concurrent chemoRT with Xeloda 1500 mg BID on M-F days with radiation on 09/26/18 -she is tolerating moderately well so far with mild fatigue and lose BM. I encouraged her to begin imodium and reviewed dosing. I strongly urged her to maintain adequate hydration as well to prevent dehydration. She and daughter understand. For fatigue I suggest limiting to 1 nap per day not to exceed 1 hour and to remain active at home. They agree.  -I again reviewed potential for hand-foot syndrome and reviewed hydrocortisone PRN for this, they will monitor.  -labs reviewed, CBC and CMP to continue at current dose. I will refill Xeloda -she will continue daily RT, and return in 1 week with lab  2. HTN, CHF - on cardizem, lasix 20 mg daily   3. H/O stroke, dementia  -On Xarelto 10 mg daily, Aricept, Namenda; followed by Dr. Krista Blue Neuro -denies bleeding   4. Depression, anxiety  -On Prozac, continue 40 mg daily   PLAN: -Labs reviewed -Continue concurrent chemoRT, Xeloda 1500 mg BID on M-F with radiation -Refill Xeloda -Lab, f/u in 1 week  -Begin imodium   All questions were answered. The patient was advised to call back or seek an in-person evaluation if the symptoms worsen or if the condition fails to improve as anticipated.  I provided 17 minutes of non-face-to-face time during this encounter.   Alla Feeling, NP 10/03/2018

## 2018-10-04 ENCOUNTER — Other Ambulatory Visit: Payer: Self-pay | Admitting: Hematology

## 2018-10-04 ENCOUNTER — Ambulatory Visit: Admission: RE | Admit: 2018-10-04 | Payer: Medicare Other | Source: Ambulatory Visit

## 2018-10-04 ENCOUNTER — Telehealth: Payer: Self-pay | Admitting: Nurse Practitioner

## 2018-10-04 DIAGNOSIS — Z510 Encounter for antineoplastic radiation therapy: Secondary | ICD-10-CM | POA: Diagnosis not present

## 2018-10-04 DIAGNOSIS — C20 Malignant neoplasm of rectum: Secondary | ICD-10-CM

## 2018-10-04 NOTE — Telephone Encounter (Signed)
Called Megan Munoz to see how Xeloda/radiation is going. Unable to leave message. VM full.

## 2018-10-04 NOTE — Telephone Encounter (Signed)
No los per 7/6. °

## 2018-10-05 ENCOUNTER — Ambulatory Visit
Admission: RE | Admit: 2018-10-05 | Discharge: 2018-10-05 | Disposition: A | Discharge status: Home / Self Care | Payer: Medicare Other | Source: Ambulatory Visit | Attending: Radiation Oncology | Admitting: Radiation Oncology

## 2018-10-05 ENCOUNTER — Other Ambulatory Visit: Payer: Self-pay

## 2018-10-05 DIAGNOSIS — C20 Malignant neoplasm of rectum: Secondary | ICD-10-CM

## 2018-10-05 DIAGNOSIS — Z510 Encounter for antineoplastic radiation therapy: Secondary | ICD-10-CM | POA: Diagnosis not present

## 2018-10-05 MED ORDER — RIVAROXABAN 10 MG PO TABS: 10.0000 mg | ORAL_TABLET | Freq: Every day | ORAL | 1 refills | Status: DC

## 2018-10-05 MED FILL — CAPECITABINE 500 MG TABS: 500 | 14 days supply | Qty: 60 | Fill #1

## 2018-10-06 ENCOUNTER — Other Ambulatory Visit: Payer: Medicare Other

## 2018-10-06 ENCOUNTER — Ambulatory Visit
Admission: RE | Admit: 2018-10-06 | Discharge: 2018-10-06 | Disposition: A | Discharge status: Home / Self Care | Payer: Medicare Other | Source: Ambulatory Visit | Attending: Radiation Oncology | Admitting: Radiation Oncology

## 2018-10-06 DIAGNOSIS — C20 Malignant neoplasm of rectum: Secondary | ICD-10-CM | POA: Diagnosis not present

## 2018-10-06 DIAGNOSIS — Z510 Encounter for antineoplastic radiation therapy: Secondary | ICD-10-CM | POA: Diagnosis not present

## 2018-10-06 NOTE — Telephone Encounter (Signed)
Pt requesting refill

## 2018-10-07 ENCOUNTER — Ambulatory Visit
Admission: RE | Admit: 2018-10-07 | Discharge: 2018-10-07 | Disposition: A | Discharge status: Home / Self Care | Payer: Medicare Other | Source: Ambulatory Visit | Attending: Radiation Oncology | Admitting: Radiation Oncology

## 2018-10-07 DIAGNOSIS — Z510 Encounter for antineoplastic radiation therapy: Secondary | ICD-10-CM | POA: Diagnosis not present

## 2018-10-07 DIAGNOSIS — C20 Malignant neoplasm of rectum: Secondary | ICD-10-CM | POA: Diagnosis not present

## 2018-10-09 NOTE — Progress Notes (Signed)
[place]   Telephone:(336) [phone] Fax:(336) [phone]   Clinic Follow up Note   Patient Care Team: Fanny Bien, MD as PCP - General (Family Medicine) Leonie Man, MD as PCP - Cardiology (Cardiology) Ronnette Juniper, MD as Consulting Physician (Gastroenterology) Arna Snipe, RN as Oncology Nurse Navigator Michael Boston, MD as Consulting Physician (General Surgery) Truitt Merle, MD as Consulting Physician (Hematology) Kyung Rudd, MD as Consulting Physician (Radiation Oncology) 10/10/2018  CHIEF COMPLAINT: f/u rectal cancer   SUMMARY OF ONCOLOGIC HISTORY: Oncology History  Rectal cancer ([place])  06/01/2018 Imaging   COLONOSCOPY - Firm, nodular, thickened, raised area felt at the anal verge found on perianal exam. 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 Biopsied. - Two 6 to 8 mm polyps in the transverse colon, removed with a hot snare. Resected and retrieved. - One 7 mm polyp in the ascending colon, removed with a hot snare. Resected and retrieved. - One 6 mm polyp in the cecum, removed with a hot snare. Resected and retrieved. - One 4 mm polyp at the hepatic flexure, removed with a cold biopsy forceps. Resected and retrieved. - One 9 mm polyp in the descending colon, removed with a hot snare. Resected and retrieved. - Diverticulosis in the sigmoid colon and in the descending colon.   06/01/2018 Initial Biopsy   Pathology from colonoscopy:  1. All colon polyps negative for malignancy. 2. Tubulovillous adenoma with at least high grade dysplasia at the rectal tumor. There were several foci highly suspicious but not definitive for invasive adenocarcinoma.    09/03/2018 Imaging   PELVIC MRI IMPRESSION: 1. 3.0 cm polypoid mass along the right anterolateral aspect of the anal verge, as described above. 2. Given the location, it is unclear whether this is more appropriately characterized as rectal cancer versus anal cancer. Regardless, given the clinical information,  rectal cancer imaging characterization has been provided. 3. Rectal adenocarcinoma T stage: T3d 4. Rectal adenocarcinoma N stage:  N0 5. Distance from tumor to the internal anal sphincter is 0 cm.   09/19/2018 Miscellaneous   TUMOR BOARD: concurrent radiation therapy and chemotherapy with Xeloda   09/21/2018 Initial Diagnosis   Rectal cancer ([place])   09/21/2018 Cancer Staging   Staging form: Colon and Rectum, AJCC 8th Edition - Clinical: Stage IIA (cT3, cN0, cM0) - Signed by Truitt Merle, MD on 09/21/2018    Chemotherapy   ChemoRT with Xeloda beginning on 09/26/18, Xeloda 1500mg  q12h on days of radiation     Radiation Therapy   ChemoRT with Xeloda beginning on 09/26/18 by Dr Lisbeth Renshaw      CURRENT THERAPY: Concurrent chemoRT with Xeloda 1500 mg BID on M-F starting 09/26/18  INTERVAL HISTORY: Ms. Mackintosh returns for f/u as scheduled. She began concurrent chemoRT with Xeloda on 6/29. She is beginning week 3. She had virtual f/u last week.)  Fatigue improved over the weekend.  Appetite is stable though she is little less interested in.  Denies mucositis. Stools are soft, 3-4 bowel movements per day. She had frequent BM prior to treatment but stools were harder. She has not utilized imodium. Has some blood with wiping. Denies rectal or other pain. Denies hand/foot redness or sensitivity. Denies dysuria. Denies fever, chills, cough, chest pain, dyspnea.     MEDICAL HISTORY:  Past Medical History:  Diagnosis Date  . Anxiety   . Atherosclerosis of aortic arch [facility]) April 2017   Noted on CT chest, calcified aortic atherosclerosis at the arch.  . Atrial fibrillation, permanent 03/2013  Previously on Atenolol for Rate - now on combination diltiazem and carvedilol for rate Control;  & Xarelto for AC. CHADS2VASC2 = 7.  . Cerebral infarction [facility]) 10/2008   MRI Brain 05/2014 for near syncope revealed: No acure infarct.  Moderate to large remote left frontal lobe infarct with encephalomalacia and dilation of  the frontal horn of the left lateral ventricle.   Mild small vessel disease type changes.  No intracranial hemorrhage.  Global atrophy.   . CHF (congestive heart failure) ([place])   . COPD (chronic obstructive pulmonary disease) ([place])   . Coronary atherosclerosis due to calcified coronary lesion April 2017   Noted on CT chest  . Dementia ([place])    following 2010 stroke  . Hyperlipemia   . Hypertension   . Pneumonia   . PONV (postoperative nausea and vomiting)   . Stroke [facility])    2010- patient has dementia related to stroke   . Tinea unguium   . Tremor   . Tuberculosis     SURGICAL HISTORY: Past Surgical History:  Procedure Laterality Date  . BIOPSY  06/01/2018   Procedure: BIOPSY;  Surgeon: Ronnette Juniper, MD;  [facility];  Service: Gastroenterology;;  . BLADDER SURGERY    . CARPAL TUNNEL RELEASE Left   . COLONOSCOPY N/A 06/01/2018   Procedure: COLONOSCOPY;  Surgeon: Ronnette Juniper, MD;  [facility];  Service: Gastroenterology;  Laterality: N/A;  . ESOPHAGOGASTRODUODENOSCOPY (EGD) WITH PROPOFOL N/A 02/04/2018   Procedure: ESOPHAGOGASTRODUODENOSCOPY (EGD) WITH PROPOFOL;  Surgeon: Ronnette Juniper, MD;  Location: [place];  Service: Gastroenterology;  Laterality: N/A;  . FOOT SURGERY Right   . HERNIA REPAIR    . NASAL SINUS SURGERY    . POLYPECTOMY  06/01/2018   Procedure: POLYPECTOMY;  Surgeon: Ronnette Juniper, MD;  [facility];  Service: Gastroenterology;;  . TOTAL ABDOMINAL HYSTERECTOMY      I have reviewed the social history and family history with the patient and they are unchanged from previous note.  ALLERGIES:  is allergic to tetanus toxoids.  MEDICATIONS:  Current Outpatient Medications  Medication Sig Dispense Refill  . albuterol (PROVENTIL) (2.5 MG/3ML) 0.083% nebulizer solution Take 3 mLs (2.5 mg total) by nebulization every 6 (six) hours as needed for wheezing or shortness of breath. 30 vial 1  . Azelastine HCl 137 MCG/SPRAY SOLN Place 2 sprays into  both nostrils 2 (two) times daily. Use in each nostril as directed     . B Complex Vitamins (VITAMIN B COMPLEX PO) Take 1 tablet by mouth daily.     Marland Kitchen BISACODYL 5 MG EC tablet TK AS DIRECTED    . calcitonin, salmon, (MIACALCIN) 200 UNIT/ACT nasal spray Place 1 spray into alternate nostrils daily.    . capecitabine (XELODA) 500 MG tablet Take 3 tablets (1,500 mg total) by mouth 2 (two) times daily after a meal. Take on days of radiation, Monday through Friday 168 tablet 0  . Cholecalciferol (VITAMIN D3 SUPER STRENGTH) 2000 units CAPS Take 2,000 Units by mouth daily.     Marland Kitchen diltiazem (CARDIZEM CD) 120 MG 24 hr capsule TAKE 3 CAPSULES DAILY (PLEASE SCHEDULE AN APPOINTMENT FOR FURTHER REFILLS) (Patient taking differently: Take 360 mg by mouth daily. ) 360 capsule 4  . docusate sodium (COLACE) 100 MG capsule Take 100-200 mg by mouth 2 (two) times daily as needed for mild constipation. Pt does take at least 100 mg daily    . donepezil (ARICEPT) 10 MG tablet Take 1 tablet (10 mg total) by mouth  at bedtime. 90 tablet 3  . feeding supplement, ENSURE ENLIVE, (ENSURE ENLIVE) LIQD Take 237 mLs by mouth 3 (three) times daily between meals. (Patient taking differently: Take 237 mLs by mouth See admin instructions. 2-3  Times daily) 237 mL 12  . ferrous sulfate 325 (65 FE) MG tablet Take 1 tablet (325 mg total) by mouth every other day. 30 tablet 0  . FLUoxetine (PROZAC) 40 MG capsule Take 40 mg by mouth daily.     . Fluticasone-Umeclidin-Vilant (TRELEGY ELLIPTA) 100-62.5-25 MCG/INH AEPB Inhale 1 puff into the lungs daily.    . furosemide (LASIX) 20 MG tablet Take 1 tablet (20 mg total) by mouth daily as needed for fluid or edema. (Patient taking differently: Take 20 mg by mouth every other day. ) 30 tablet 6  . loratadine (CLARITIN) 10 MG tablet Take 10 mg by mouth daily.    . Melatonin 10 MG TABS Take 10 mg by mouth at bedtime.     . memantine (NAMENDA) 10 MG tablet Take 1 tablet (10 mg total) by mouth 2 (two)  times daily. 180 tablet 3  . Multiple Vitamin (MULTIVITAMIN) capsule Take 1 capsule by mouth daily.    . ondansetron (ZOFRAN) 8 MG tablet TAKE 0.5-1 TABLET (4-8 MG TOTAL) BY MOUTH EVERY 8 (EIGHT) HOURS AS NEEDED FOR NAUSEA OR VOMITING. 30 tablet 0  . rivaroxaban (XARELTO) 10 MG TABS tablet Take 1 tablet (10 mg total) by mouth daily. 30 tablet 1  . simvastatin (ZOCOR) 40 MG tablet Take 40 mg by mouth daily.    . carvedilol (COREG) 12.5 MG tablet TAKE 1 TABLET TWICE A DAY WITH MEALS (DUE FOR FOLLOW UP APPOINTMENT IN [place]) (Patient not taking: Reported on 09/26/2018) 180 tablet 4  . potassium chloride SA (K-DUR) 20 MEQ tablet Take 1 tablet (20 mEq total) by mouth daily. 30 tablet 0   No current facility-administered medications for this visit.     PHYSICAL EXAMINATION: ECOG PERFORMANCE STATUS: 1 - Symptomatic but completely ambulatory  Vitals:   10/10/18 0918  BP: 140/62  Pulse: 79  Resp: 18  Temp: 99.1 F (37.3 C)  SpO2: 99%   Filed Weights   10/10/18 0918  Weight: 140 lb 8 oz (63.7 kg)    GENERAL:alert, no distress and comfortable SKIN: no obvious rash. Palms without erythema  EYES:sclera clear LUNGS: respirations even and unlabored  HEART:  no lower extremity edema Musculoskeletal:no cyanosis of digits  NEURO: alert & oriented x 3 with fluent speech Rectal: mild skin hyperpigmentation and erythema to gluteal cleft, skin is intact Limited exam for covid19 outbreak   LABORATORY DATA:  I have reviewed the data as listed CBC Latest Ref Rng & Units 10/10/2018 10/03/2018 09/27/2018  WBC 4.0 - 10.5 K/uL 3.3(L) 3.4(L) 4.9  Hemoglobin 12.0 - 15.0 g/dL 12.3 11.7(L) 11.6(L)  Hematocrit 36.0 - 46.0 % 38.5 35.9(L) 37.0  Platelets 150 - 400 K/uL 124(L) 169 190     CMP Latest Ref Rng & Units 10/10/2018 10/03/2018 09/27/2018  Glucose 70 - 99 mg/dL 77 101(H) 93  BUN 8 - 23 mg/dL 16 17 22   Creatinine 0.44 - 1.00 mg/dL 0.75 0.75 0.75  Sodium 135 - 145 mmol/L 139 143 143  Potassium 3.5 - 5.1  mmol/L 3.2(L) 3.9 4.1  Chloride 98 - 111 mmol/L 104 108 106  CO2 22 - 32 mmol/L 26 26 30   Calcium 8.9 - 10.3 mg/dL 8.8(L) 8.7(L) 9.1  Total Protein 6.5 - 8.1 g/dL 7.2 6.7 6.9  Total Bilirubin  0.3 - 1.2 mg/dL 0.6 0.4 0.4  Alkaline Phos 38 - 126 U/L 86 94 93  AST 15 - 41 U/L 25 23 20   ALT 0 - 44 U/L 15 15 13       RADIOGRAPHIC STUDIES: I have personally reviewed the radiological images as listed and agreed with the findings in the report. No results found.   ASSESSMENT & PLAN: 85 yo female with   1. Distal rectal adenocarcinoma, cT3N0M0, stage IIA -She began concurrent chemoRT with Xeloda 1500 mg BID on M-F on 09/26/18.   2. HTN, CHF - on cardizem, lasix 20 mg daily   3. H/O stroke, dementia  -On Xarelto 10 mg daily, Aricept, Namenda; followed by Dr. Krista Blue Neuro  4. Depression, anxiety  -On Prozac 40 mg daily   Ms. Capece appears well. She continues chemoRT with Xeloda 1500 mg BID on M-F. She is tolerating moderately well with soft, frequent BM - 3-4 per day - and mild fatigue. I recommend she begin imodium with 1-2 tabs with AM medication, can increase throughout the day as needed. She plans to use wipes and desitin with BM as well. She denies pain. Labs reviewed. WBC 3.3, Hg normal; she is on oral iron daily. K 3.2, I recommend beginning oral potassium 20 mEq daily. I prescribed today. She will continue treatment at current dose. Will change f/u next week to phone visit per family's request, then in-person visit in 2 weeks. Lab weekly on treatment.   PLAN: -CBC reviewed -begin oral K 20 mEq daily  -Continue chemoRT at current dose, Xeloda 1500 mg BID on M-F days with radiation -Begin imodium, 1-2 tabs with morning medication then PRN throughout the day for prophylaxis -desitin after BM -Change f/u next week to virtual visit per patient/family request  -Lab, f/u weekly on treatment  All questions were answered. The patient knows to call the clinic with any problems, questions  or concerns. No barriers to learning was detected.     Alla Feeling, NP 10/10/18

## 2018-10-10 ENCOUNTER — Other Ambulatory Visit: Payer: Self-pay

## 2018-10-10 ENCOUNTER — Telehealth: Payer: Self-pay

## 2018-10-10 ENCOUNTER — Inpatient Hospital Stay: Payer: Medicare Other

## 2018-10-10 ENCOUNTER — Encounter: Payer: Self-pay | Admitting: Nurse Practitioner

## 2018-10-10 ENCOUNTER — Ambulatory Visit
Admission: RE | Admit: 2018-10-10 | Discharge: 2018-10-10 | Disposition: A | Discharge status: Home / Self Care | Payer: Medicare Other | Source: Ambulatory Visit | Attending: Radiation Oncology | Admitting: Radiation Oncology

## 2018-10-10 ENCOUNTER — Inpatient Hospital Stay (HOSPITAL_BASED_OUTPATIENT_CLINIC_OR_DEPARTMENT_OTHER): Payer: Medicare Other | Admitting: Nurse Practitioner

## 2018-10-10 ENCOUNTER — Telehealth: Payer: Self-pay | Admitting: Nurse Practitioner

## 2018-10-10 VITALS — BP 140/62 | HR 79 | Temp 99.1°F | Resp 18 | Ht 67.0 in | Wt 140.5 lb

## 2018-10-10 DIAGNOSIS — F419 Anxiety disorder, unspecified: Secondary | ICD-10-CM | POA: Diagnosis not present

## 2018-10-10 DIAGNOSIS — Z8673 Personal history of transient ischemic attack (TIA), and cerebral infarction without residual deficits: Secondary | ICD-10-CM | POA: Diagnosis not present

## 2018-10-10 DIAGNOSIS — Z7901 Long term (current) use of anticoagulants: Secondary | ICD-10-CM | POA: Diagnosis not present

## 2018-10-10 DIAGNOSIS — E860 Dehydration: Secondary | ICD-10-CM | POA: Diagnosis not present

## 2018-10-10 DIAGNOSIS — C20 Malignant neoplasm of rectum: Secondary | ICD-10-CM

## 2018-10-10 DIAGNOSIS — Z510 Encounter for antineoplastic radiation therapy: Secondary | ICD-10-CM | POA: Diagnosis not present

## 2018-10-10 DIAGNOSIS — F329 Major depressive disorder, single episode, unspecified: Secondary | ICD-10-CM

## 2018-10-10 DIAGNOSIS — I10 Essential (primary) hypertension: Secondary | ICD-10-CM | POA: Diagnosis not present

## 2018-10-10 DIAGNOSIS — J449 Chronic obstructive pulmonary disease, unspecified: Secondary | ICD-10-CM | POA: Diagnosis not present

## 2018-10-10 LAB — CMP (CANCER CENTER ONLY)
ALT: 15 U/L (ref 0–44)
AST: 25 U/L (ref 15–41)
Albumin: 4 g/dL (ref 3.5–5.0)
Alkaline Phosphatase: 86 U/L (ref 38–126)
Anion gap: 9 (ref 5–15)
BUN: 16 mg/dL (ref 8–23)
CO2: 26 mmol/L (ref 22–32)
Calcium: 8.8 mg/dL — ABNORMAL LOW (ref 8.9–10.3)
Chloride: 104 mmol/L (ref 98–111)
Creatinine: 0.75 mg/dL (ref 0.44–1.00)
GFR, Est AFR Am: >60 mL/min (ref >60)
GFR, Estimated: >60 mL/min (ref >60)
Glucose, Bld: 77 mg/dL (ref 70–99)
Potassium: 3.2 mmol/L — ABNORMAL LOW (ref 3.5–5.1)
Sodium: 139 mmol/L (ref 135–145)
Total Bilirubin: 0.6 mg/dL (ref 0.3–1.2)
Total Protein: 7.2 g/dL (ref 6.5–8.1)

## 2018-10-10 LAB — CBC WITH DIFFERENTIAL (CANCER CENTER ONLY)
Abs Immature Granulocytes: 0.01 10*3/uL (ref 0.00–0.07)
Basophils Absolute: 0 10*3/uL (ref 0.0–0.1)
Basophils Relative: 0 %
Eosinophils Absolute: 0 10*3/uL (ref 0.0–0.5)
Eosinophils Relative: 0 %
HCT: 38.5 % (ref 36.0–46.0)
Hemoglobin: 12.3 g/dL (ref 12.0–15.0)
Immature Granulocytes: 0 %
Lymphocytes Relative: 14 %
Lymphs Abs: 0.5 10*3/uL — ABNORMAL LOW (ref 0.7–4.0)
MCH: 29.9 pg (ref 26.0–34.0)
MCHC: 31.9 g/dL (ref 30.0–36.0)
MCV: 93.4 fL (ref 80.0–100.0)
Monocytes Absolute: 0.2 10*3/uL (ref 0.1–1.0)
Monocytes Relative: 6 %
Neutro Abs: 2.7 10*3/uL (ref 1.7–7.7)
Neutrophils Relative %: 80 %
Platelet Count: 124 10*3/uL — ABNORMAL LOW (ref 150–400)
RBC: 4.12 MIL/uL (ref 3.87–5.11)
RDW: 13.6 % (ref 11.5–15.5)
WBC Count: 3.3 10*3/uL — ABNORMAL LOW (ref 4.0–10.5)
nRBC: 0 % (ref 0.0–0.2)

## 2018-10-10 MED ORDER — POTASSIUM CHLORIDE CRYS ER 20 MEQ PO TBCR: 20.0000 meq | EXTENDED_RELEASE_TABLET | Freq: Every day | ORAL | 0 refills | Status: DC

## 2018-10-10 NOTE — Telephone Encounter (Signed)
Scheduled appt per 7/13 los.  Spoke with patient daughter and she is aware of the appt date and time as a phone visit.

## 2018-10-10 NOTE — Telephone Encounter (Signed)
TC to patients daughter Maudie Mercury per lacie to let her know  K level was 3.2. Normal liver and kidney function. And that Lacie recommend starting oral K supplement 1 tab daily, and to see which pharmacy she prefers. Patient prefers it to be sent to [facility] on [address] and [employer]. Lacie made aware

## 2018-10-11 ENCOUNTER — Ambulatory Visit
Admission: RE | Admit: 2018-10-11 | Discharge: 2018-10-11 | Disposition: A | Discharge status: Home / Self Care | Payer: Medicare Other | Source: Ambulatory Visit | Attending: Radiation Oncology | Admitting: Radiation Oncology

## 2018-10-11 DIAGNOSIS — Z510 Encounter for antineoplastic radiation therapy: Secondary | ICD-10-CM | POA: Diagnosis not present

## 2018-10-11 DIAGNOSIS — C20 Malignant neoplasm of rectum: Secondary | ICD-10-CM | POA: Diagnosis not present

## 2018-10-12 ENCOUNTER — Ambulatory Visit
Admission: RE | Admit: 2018-10-12 | Discharge: 2018-10-12 | Disposition: A | Discharge status: Home / Self Care | Payer: Medicare Other | Source: Ambulatory Visit | Attending: Radiation Oncology | Admitting: Radiation Oncology

## 2018-10-12 DIAGNOSIS — Z510 Encounter for antineoplastic radiation therapy: Secondary | ICD-10-CM | POA: Diagnosis not present

## 2018-10-12 DIAGNOSIS — C20 Malignant neoplasm of rectum: Secondary | ICD-10-CM | POA: Diagnosis not present

## 2018-10-13 ENCOUNTER — Ambulatory Visit
Admission: RE | Admit: 2018-10-13 | Discharge: 2018-10-13 | Disposition: A | Discharge status: Home / Self Care | Payer: Medicare Other | Source: Ambulatory Visit | Attending: Radiation Oncology | Admitting: Radiation Oncology

## 2018-10-13 DIAGNOSIS — C20 Malignant neoplasm of rectum: Secondary | ICD-10-CM | POA: Diagnosis not present

## 2018-10-13 DIAGNOSIS — Z510 Encounter for antineoplastic radiation therapy: Secondary | ICD-10-CM | POA: Diagnosis not present

## 2018-10-14 ENCOUNTER — Ambulatory Visit
Admission: RE | Admit: 2018-10-14 | Discharge: 2018-10-14 | Disposition: A | Discharge status: Home / Self Care | Payer: Medicare Other | Source: Ambulatory Visit | Attending: Radiation Oncology | Admitting: Radiation Oncology

## 2018-10-14 DIAGNOSIS — Z510 Encounter for antineoplastic radiation therapy: Secondary | ICD-10-CM | POA: Diagnosis not present

## 2018-10-14 DIAGNOSIS — C20 Malignant neoplasm of rectum: Secondary | ICD-10-CM | POA: Diagnosis not present

## 2018-10-17 ENCOUNTER — Inpatient Hospital Stay: Payer: Medicare Other

## 2018-10-17 ENCOUNTER — Ambulatory Visit
Admission: RE | Admit: 2018-10-17 | Discharge: 2018-10-17 | Disposition: A | Discharge status: Home / Self Care | Payer: Medicare Other | Source: Ambulatory Visit | Attending: Radiation Oncology | Admitting: Radiation Oncology

## 2018-10-17 ENCOUNTER — Ambulatory Visit: Payer: Medicare Other | Admitting: Nurse Practitioner

## 2018-10-17 ENCOUNTER — Inpatient Hospital Stay (HOSPITAL_BASED_OUTPATIENT_CLINIC_OR_DEPARTMENT_OTHER): Payer: Medicare Other | Admitting: Nurse Practitioner

## 2018-10-17 ENCOUNTER — Other Ambulatory Visit: Payer: Self-pay

## 2018-10-17 DIAGNOSIS — F419 Anxiety disorder, unspecified: Secondary | ICD-10-CM | POA: Diagnosis not present

## 2018-10-17 DIAGNOSIS — C20 Malignant neoplasm of rectum: Secondary | ICD-10-CM

## 2018-10-17 DIAGNOSIS — Z8673 Personal history of transient ischemic attack (TIA), and cerebral infarction without residual deficits: Secondary | ICD-10-CM | POA: Diagnosis not present

## 2018-10-17 DIAGNOSIS — Z7901 Long term (current) use of anticoagulants: Secondary | ICD-10-CM | POA: Diagnosis not present

## 2018-10-17 DIAGNOSIS — Z510 Encounter for antineoplastic radiation therapy: Secondary | ICD-10-CM | POA: Diagnosis not present

## 2018-10-17 DIAGNOSIS — F329 Major depressive disorder, single episode, unspecified: Secondary | ICD-10-CM | POA: Diagnosis not present

## 2018-10-17 DIAGNOSIS — E860 Dehydration: Secondary | ICD-10-CM | POA: Diagnosis not present

## 2018-10-17 LAB — CBC WITH DIFFERENTIAL (CANCER CENTER ONLY)
Abs Immature Granulocytes: 0.03 10*3/uL (ref 0.00–0.07)
Basophils Absolute: 0 10*3/uL (ref 0.0–0.1)
Basophils Relative: 0 %
Eosinophils Absolute: 0 10*3/uL (ref 0.0–0.5)
Eosinophils Relative: 1 %
HCT: 36.4 % (ref 36.0–46.0)
Hemoglobin: 11.6 g/dL — ABNORMAL LOW (ref 12.0–15.0)
Immature Granulocytes: 1 %
Lymphocytes Relative: 17 %
Lymphs Abs: 0.4 10*3/uL — ABNORMAL LOW (ref 0.7–4.0)
MCH: 30.3 pg (ref 26.0–34.0)
MCHC: 31.9 g/dL (ref 30.0–36.0)
MCV: 95 fL (ref 80.0–100.0)
Monocytes Absolute: 0.2 10*3/uL (ref 0.1–1.0)
Monocytes Relative: 9 %
Neutro Abs: 1.9 10*3/uL (ref 1.7–7.7)
Neutrophils Relative %: 72 %
Platelet Count: 133 10*3/uL — ABNORMAL LOW (ref 150–400)
RBC: 3.83 MIL/uL — ABNORMAL LOW (ref 3.87–5.11)
RDW: 15 % (ref 11.5–15.5)
WBC Count: 2.6 10*3/uL — ABNORMAL LOW (ref 4.0–10.5)
nRBC: 0 % (ref 0.0–0.2)

## 2018-10-17 LAB — CMP (CANCER CENTER ONLY)
ALT: 14 U/L (ref 0–44)
AST: 22 U/L (ref 15–41)
Albumin: 3.6 g/dL (ref 3.5–5.0)
Alkaline Phosphatase: 76 U/L (ref 38–126)
Anion gap: 9 (ref 5–15)
BUN: 14 mg/dL (ref 8–23)
CO2: 28 mmol/L (ref 22–32)
Calcium: 8.6 mg/dL — ABNORMAL LOW (ref 8.9–10.3)
Chloride: 106 mmol/L (ref 98–111)
Creatinine: 0.72 mg/dL (ref 0.44–1.00)
GFR, Est AFR Am: >60 mL/min (ref >60)
GFR, Estimated: >60 mL/min (ref >60)
Glucose, Bld: 96 mg/dL (ref 70–99)
Potassium: 3.7 mmol/L (ref 3.5–5.1)
Sodium: 143 mmol/L (ref 135–145)
Total Bilirubin: 0.6 mg/dL (ref 0.3–1.2)
Total Protein: 6.3 g/dL — ABNORMAL LOW (ref 6.5–8.1)

## 2018-10-17 NOTE — Progress Notes (Signed)
[facility]   [phone] Fax:([phone]) [phone]   Clinic Follow up Note   Patient Care Team: Fanny Bien, MD as PCP - General (Family Medicine) Leonie Man, MD as PCP - Cardiology (Cardiology) Ronnette Juniper, MD as Consulting Physician (Gastroenterology) Arna Snipe, RN as Oncology Nurse Navigator Michael Boston, MD as Consulting Physician (General Surgery) Truitt Merle, MD as Consulting Physician (Hematology) Kyung Rudd, MD as Consulting Physician (Radiation Oncology)  Date of service: 10/17/2018  I connected with Megan Munoz on 10/18/18 at  3:15 PM EDT by telephone visit and verified that I am speaking with the correct person using two identifiers.   I discussed the limitations, risks, security and privacy concerns of performing an evaluation and management service by telemedicine and the availability of in-person appointments. I also discussed with the patient that there may be a patient responsible charge related to this service. The patient expressed understanding and agreed to proceed.   Other persons participating in the visit and their role in the encounter: daughter, [name]    Patient's location: Home Provider's location: working remotely, provider's home   CHIEF COMPLAINT: f/u rectal cancer   SUMMARY OF ONCOLOGIC HISTORY: Oncology History  Rectal cancer ([place])  06/01/2018 Imaging   COLONOSCOPY - Firm, nodular, thickened, raised area felt at the anal verge found on perianal exam. 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 Biopsied. - Two 6 to 8 mm polyps in the transverse colon, removed with a hot snare. Resected and retrieved. - One 7 mm polyp in the ascending colon, removed with a hot snare. Resected and retrieved. - One 6 mm polyp in the cecum, removed with a hot snare. Resected and retrieved. - One 4 mm polyp at the hepatic flexure, removed with a cold biopsy forceps. Resected and retrieved. - One 9 mm polyp in the descending colon, removed  with a hot snare. Resected and retrieved. - Diverticulosis in the sigmoid colon and in the descending colon.   06/01/2018 Initial Biopsy   Pathology from colonoscopy:  1. All colon polyps negative for malignancy. 2. Tubulovillous adenoma with at least high grade dysplasia at the rectal tumor. There were several foci highly suspicious but not definitive for invasive adenocarcinoma.    09/03/2018 Imaging   PELVIC MRI IMPRESSION: 1. 3.0 cm polypoid mass along the right anterolateral aspect of the anal verge, as described above. 2. Given the location, it is unclear whether this is more appropriately characterized as rectal cancer versus anal cancer. Regardless, given the clinical information, rectal cancer imaging characterization has been provided. 3. Rectal adenocarcinoma T stage: T3d 4. Rectal adenocarcinoma N stage:  N0 5. Distance from tumor to the internal anal sphincter is 0 cm.   09/19/2018 Miscellaneous   TUMOR BOARD: concurrent radiation therapy and chemotherapy with Xeloda   09/21/2018 Initial Diagnosis   Rectal cancer ([place])   09/21/2018 Cancer Staging   Staging form: Colon and Rectum, AJCC 8th Edition - Clinical: Stage IIA (cT3, cN0, cM0) - Signed by Truitt Merle, MD on 09/21/2018    Chemotherapy   ChemoRT with Xeloda beginning on 09/26/18, Xeloda 1500mg  q12h on days of radiation     Radiation Therapy   ChemoRT with Xeloda beginning on 09/26/18 by Dr Lisbeth Renshaw      CURRENT THERAPY: concurrent chemoRT with Xeloda 1500 mg BID M-F on days with radiation, began 09/26/18   INTERVAL HISTORY: Megan Munoz has been feeling well. She remains functional and able to complete all ADLs, but does seem a little  more tired today. She had multiple bowel movements at lunchtime today that began with formed stool then progressed to loose. Usually she has 2-3 bowel movements per day, occasionally 4-5. No blood in stool. She has not started imodium. Has mild rectal pain with BM. Using desitin, denies obvious skin  changes or breakdown. Has little interest in food but still eating and occasional mild nausea. No vomiting. Rarely uses zofran. No mucositis. She drinks adequately. Denies hand/foot redness or pain. Denies fever, chills.   MEDICAL HISTORY:  Past Medical History:  Diagnosis Date  . Anxiety   . Atherosclerosis of aortic arch [facility]) April 2017   Noted on CT chest, calcified aortic atherosclerosis at the arch.  . Atrial fibrillation, permanent 03/2013   Previously on Atenolol for Rate - now on combination diltiazem and carvedilol for rate Control;  & Xarelto for AC. CHADS2VASC2 = 7.  . Cerebral infarction [facility]) 10/2008   MRI Brain 05/2014 for near syncope revealed: No acure infarct.  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   Mild small vessel disease type changes.  No intracranial hemorrhage.  Global atrophy.   . CHF (congestive heart failure) ([place])   . COPD (chronic obstructive pulmonary disease) ([place])   . Coronary atherosclerosis due to calcified coronary lesion April 2017   Noted on CT chest  . Dementia ([place])    following 2010 stroke  . Hyperlipemia   . Hypertension   . Pneumonia   . PONV (postoperative nausea and vomiting)   . Stroke [facility])    2010- patient has dementia related to stroke   . Tinea unguium   . Tremor   . Tuberculosis     SURGICAL HISTORY: Past Surgical History:  Procedure Laterality Date  . BIOPSY  06/01/2018   Procedure: BIOPSY;  Surgeon: Ronnette Juniper, MD;  Location: [facility];  Service: Gastroenterology;;  . BLADDER SURGERY    . CARPAL TUNNEL RELEASE Left   . COLONOSCOPY N/A 06/01/2018   Procedure: COLONOSCOPY;  Surgeon: Ronnette Juniper, MD;  Location: [facility];  Service: Gastroenterology;  Laterality: N/A;  . ESOPHAGOGASTRODUODENOSCOPY (EGD) WITH PROPOFOL N/A 02/04/2018   Procedure: ESOPHAGOGASTRODUODENOSCOPY (EGD) WITH PROPOFOL;  Surgeon: Ronnette Juniper, MD;  Location: [place];  Service: Gastroenterology;  Laterality: N/A;  . FOOT SURGERY Right   . HERNIA REPAIR    . NASAL SINUS SURGERY    . POLYPECTOMY  06/01/2018   Procedure: POLYPECTOMY;  Surgeon: Ronnette Juniper, MD;  Location: [facility];  Service: Gastroenterology;;  . TOTAL ABDOMINAL HYSTERECTOMY      I have reviewed the social history and family history with the patient and they are unchanged from previous note.  ALLERGIES:  is allergic to tetanus toxoids.  MEDICATIONS:  Current Outpatient Medications  Medication Sig Dispense Refill  . albuterol (PROVENTIL) (2.5 MG/3ML) 0.083% nebulizer solution Take 3 mLs (2.5 mg total) by nebulization every 6 (six) hours as needed for wheezing or shortness of breath. 30 vial 1  . Azelastine HCl 137 MCG/SPRAY SOLN Place 2 sprays into both nostrils 2 (two) times daily. Use in each nostril as directed     . B Complex Vitamins (VITAMIN B COMPLEX PO) Take 1 tablet by mouth daily.     Marland Kitchen BISACODYL 5 MG EC tablet TK AS DIRECTED    . calcitonin, salmon, (MIACALCIN) 200 UNIT/ACT nasal spray Place 1 spray into alternate nostrils daily.    . capecitabine (XELODA) 500 MG  tablet Take 3 tablets (1,500 mg total) by mouth 2 (two) times daily after a meal. Take on days of radiation, Monday through Friday 168 tablet 0  . carvedilol (COREG) 12.5 MG tablet TAKE 1 TABLET TWICE A DAY WITH MEALS (DUE FOR FOLLOW UP APPOINTMENT IN [place]) (Patient not taking: Reported on 09/26/2018) 180 tablet 4  . Cholecalciferol (VITAMIN D3 SUPER STRENGTH) 2000 units CAPS Take 2,000 Units by mouth daily.     Marland Kitchen diltiazem (CARDIZEM CD) 120 MG 24 hr capsule TAKE 3 CAPSULES DAILY (PLEASE SCHEDULE AN APPOINTMENT FOR FURTHER REFILLS) (Patient taking differently: Take 360 mg by mouth daily. ) 360 capsule 4  . docusate sodium (COLACE) 100 MG capsule Take 100-200 mg by mouth 2 (two) times daily as needed for mild constipation. Pt does take at least 100 mg daily    . donepezil (ARICEPT) 10 MG tablet Take 1 tablet (10  mg total) by mouth at bedtime. 90 tablet 3  . feeding supplement, ENSURE ENLIVE, (ENSURE ENLIVE) LIQD Take 237 mLs by mouth 3 (three) times daily between meals. (Patient taking differently: Take 237 mLs by mouth See admin instructions. 2-3  Times daily) 237 mL 12  . ferrous sulfate 325 (65 FE) MG tablet Take 1 tablet (325 mg total) by mouth every other day. 30 tablet 0  . FLUoxetine (PROZAC) 40 MG capsule Take 40 mg by mouth daily.     . Fluticasone-Umeclidin-Vilant (TRELEGY ELLIPTA) 100-62.5-25 MCG/INH AEPB Inhale 1 puff into the lungs daily.    . furosemide (LASIX) 20 MG tablet Take 1 tablet (20 mg total) by mouth daily as needed for fluid or edema. (Patient taking differently: Take 20 mg by mouth every other day. ) 30 tablet 6  . loratadine (CLARITIN) 10 MG tablet Take 10 mg by mouth daily.    . Melatonin 10 MG TABS Take 10 mg by mouth at bedtime.     . memantine (NAMENDA) 10 MG tablet Take 1 tablet (10 mg total) by mouth 2 (two) times daily. 180 tablet 3  . Multiple Vitamin (MULTIVITAMIN) capsule Take 1 capsule by mouth daily.    . ondansetron (ZOFRAN) 8 MG tablet TAKE 0.5-1 TABLET (4-8 MG TOTAL) BY MOUTH EVERY 8 (EIGHT) HOURS AS NEEDED FOR NAUSEA OR VOMITING. 30 tablet 0  . potassium chloride SA (K-DUR) 20 MEQ tablet Take 1 tablet (20 mEq total) by mouth daily. 30 tablet 0  . rivaroxaban (XARELTO) 10 MG TABS tablet Take 1 tablet (10 mg total) by mouth daily. 30 tablet 1  . simvastatin (ZOCOR) 40 MG tablet Take 40 mg by mouth daily.     No current facility-administered medications for this visit.     PHYSICAL EXAMINATION: Patient appears well over the phone. Speech is intact, non-pressured. Mood and affect appear normal for situation.    LABORATORY DATA:  I have reviewed the data as listed CBC Latest Ref Rng & Units 10/17/2018 10/10/2018 10/03/2018  WBC 4.0 - 10.5 K/uL 2.6(L) 3.3(L) 3.4(L)  Hemoglobin 12.0 - 15.0 g/dL 11.6(L) 12.3 11.7(L)  Hematocrit 36.0 - 46.0 % 36.4 38.5 35.9(L)   Platelets 150 - 400 K/uL 133(L) 124(L) 169     CMP Latest Ref Rng & Units 10/17/2018 10/10/2018 10/03/2018  Glucose 70 - 99 mg/dL 96 77 101(H)  BUN 8 - 23 mg/dL 14 16 17   Creatinine 0.44 - 1.00 mg/dL 0.72 0.75 0.75  Sodium 135 - 145 mmol/L 143 139 143  Potassium 3.5 - 5.1 mmol/L 3.7 3.2(L) 3.9  Chloride 98 - 111  mmol/L 106 104 108  CO2 22 - 32 mmol/L 28 26 26   Calcium 8.9 - 10.3 mg/dL 8.6(L) 8.8(L) 8.7(L)  Total Protein 6.5 - 8.1 g/dL 6.3(L) 7.2 6.7  Total Bilirubin 0.3 - 1.2 mg/dL 0.6 0.6 0.4  Alkaline Phos 38 - 126 U/L 76 86 94  AST 15 - 41 U/L 22 25 23   ALT 0 - 44 U/L 14 15 15       RADIOGRAPHIC STUDIES: I have personally reviewed the radiological images as listed and agreed with the findings in the report. No results found.   ASSESSMENT & PLAN: 85 yo female with   1. Distal rectal adenocarcinoma, cT3N0M0, stage IIA -She began concurrent chemoRT with Xeloda 1500 mg BID on M-F on 09/26/18.  -tolerating moderately well overall  2. HTN, CHF - on cardizem, lasix 20 mg daily   3. H/O stroke, dementia  -On Xarelto 10 mg daily, Aricept, Namenda; followed by Dr. Krista Blue [facility]  4. Depression, anxiety  -On Prozac 40 mg daily  Megan Munoz appears well. She continues chemoRT with Xeloda 1500 mg BID on M-F. She is tolerating moderately well overall with loose stool, mild fatigue and intermittent nausea. She remains able to function well. We reviewed symptom management. She will begin imodium and zofran today. She continues desitin for her skin. Labs reviewed, WBC trending down, plt mildly decreased; overall CBC and CMP adequate to continue current treatment plan. Will monitor closely. We reviewed infection precautions. She will return for lab next week and virtual visit. Next in-person visit in 2 weeks or sooner if needed.    I spent 15 minutes non face-to-face time in today's encounter. All questions were answered. The patient knows to call the clinic to seek in-person evaluation with  any problems, questions or concerns.     Alla Feeling, NP 10/18/18

## 2018-10-18 ENCOUNTER — Encounter: Payer: Self-pay | Admitting: Nurse Practitioner

## 2018-10-18 ENCOUNTER — Telehealth: Payer: Self-pay | Admitting: Nurse Practitioner

## 2018-10-18 ENCOUNTER — Ambulatory Visit
Admission: RE | Admit: 2018-10-18 | Discharge: 2018-10-18 | Disposition: A | Discharge status: Home / Self Care | Payer: Medicare Other | Source: Ambulatory Visit | Attending: Radiation Oncology | Admitting: Radiation Oncology

## 2018-10-18 DIAGNOSIS — C20 Malignant neoplasm of rectum: Secondary | ICD-10-CM | POA: Diagnosis not present

## 2018-10-18 DIAGNOSIS — Z510 Encounter for antineoplastic radiation therapy: Secondary | ICD-10-CM | POA: Diagnosis not present

## 2018-10-18 MED FILL — CAPECITABINE 500 MG TABS: 500 | 11 days supply | Qty: 48 | Fill #2

## 2018-10-18 NOTE — Telephone Encounter (Signed)
No los per 7/20.

## 2018-10-19 ENCOUNTER — Ambulatory Visit
Admission: RE | Admit: 2018-10-19 | Discharge: 2018-10-19 | Disposition: A | Discharge status: Home / Self Care | Payer: Medicare Other | Source: Ambulatory Visit | Attending: Radiation Oncology | Admitting: Radiation Oncology

## 2018-10-19 DIAGNOSIS — Z510 Encounter for antineoplastic radiation therapy: Secondary | ICD-10-CM | POA: Diagnosis not present

## 2018-10-19 DIAGNOSIS — C20 Malignant neoplasm of rectum: Secondary | ICD-10-CM | POA: Diagnosis not present

## 2018-10-20 ENCOUNTER — Ambulatory Visit
Admission: RE | Admit: 2018-10-20 | Discharge: 2018-10-20 | Disposition: A | Discharge status: Home / Self Care | Payer: Medicare Other | Source: Ambulatory Visit | Attending: Radiation Oncology | Admitting: Radiation Oncology

## 2018-10-20 DIAGNOSIS — Z510 Encounter for antineoplastic radiation therapy: Secondary | ICD-10-CM | POA: Diagnosis not present

## 2018-10-20 DIAGNOSIS — C20 Malignant neoplasm of rectum: Secondary | ICD-10-CM | POA: Diagnosis not present

## 2018-10-21 ENCOUNTER — Ambulatory Visit
Admission: RE | Admit: 2018-10-21 | Discharge: 2018-10-21 | Disposition: A | Discharge status: Home / Self Care | Payer: Medicare Other | Source: Ambulatory Visit | Attending: Radiation Oncology | Admitting: Radiation Oncology

## 2018-10-21 DIAGNOSIS — C20 Malignant neoplasm of rectum: Secondary | ICD-10-CM | POA: Diagnosis not present

## 2018-10-21 DIAGNOSIS — Z510 Encounter for antineoplastic radiation therapy: Secondary | ICD-10-CM | POA: Diagnosis not present

## 2018-10-24 ENCOUNTER — Encounter: Payer: Self-pay | Admitting: Nurse Practitioner

## 2018-10-24 ENCOUNTER — Ambulatory Visit
Admission: RE | Admit: 2018-10-24 | Discharge: 2018-10-24 | Disposition: A | Discharge status: Home / Self Care | Payer: Medicare Other | Source: Ambulatory Visit | Attending: Radiation Oncology | Admitting: Radiation Oncology

## 2018-10-24 ENCOUNTER — Other Ambulatory Visit: Payer: Self-pay

## 2018-10-24 ENCOUNTER — Inpatient Hospital Stay (HOSPITAL_BASED_OUTPATIENT_CLINIC_OR_DEPARTMENT_OTHER): Payer: Medicare Other | Admitting: Nurse Practitioner

## 2018-10-24 ENCOUNTER — Inpatient Hospital Stay: Payer: Medicare Other

## 2018-10-24 ENCOUNTER — Ambulatory Visit: Payer: Medicare Other | Admitting: Nurse Practitioner

## 2018-10-24 DIAGNOSIS — J449 Chronic obstructive pulmonary disease, unspecified: Secondary | ICD-10-CM | POA: Diagnosis not present

## 2018-10-24 DIAGNOSIS — Z8673 Personal history of transient ischemic attack (TIA), and cerebral infarction without residual deficits: Secondary | ICD-10-CM | POA: Diagnosis not present

## 2018-10-24 DIAGNOSIS — E860 Dehydration: Secondary | ICD-10-CM | POA: Diagnosis not present

## 2018-10-24 DIAGNOSIS — C20 Malignant neoplasm of rectum: Secondary | ICD-10-CM

## 2018-10-24 DIAGNOSIS — F419 Anxiety disorder, unspecified: Secondary | ICD-10-CM | POA: Diagnosis not present

## 2018-10-24 DIAGNOSIS — I10 Essential (primary) hypertension: Secondary | ICD-10-CM | POA: Diagnosis not present

## 2018-10-24 DIAGNOSIS — F329 Major depressive disorder, single episode, unspecified: Secondary | ICD-10-CM | POA: Diagnosis not present

## 2018-10-24 DIAGNOSIS — Z7901 Long term (current) use of anticoagulants: Secondary | ICD-10-CM | POA: Diagnosis not present

## 2018-10-24 DIAGNOSIS — I2510 Atherosclerotic heart disease of native coronary artery without angina pectoris: Secondary | ICD-10-CM

## 2018-10-24 DIAGNOSIS — Z510 Encounter for antineoplastic radiation therapy: Secondary | ICD-10-CM | POA: Diagnosis not present

## 2018-10-24 LAB — CMP (CANCER CENTER ONLY)
ALT: 13 U/L (ref 0–44)
AST: 22 U/L (ref 15–41)
Albumin: 3.4 g/dL — ABNORMAL LOW (ref 3.5–5.0)
Alkaline Phosphatase: 67 U/L (ref 38–126)
Anion gap: 8 (ref 5–15)
BUN: 17 mg/dL (ref 8–23)
CO2: 26 mmol/L (ref 22–32)
Calcium: 8.5 mg/dL — ABNORMAL LOW (ref 8.9–10.3)
Chloride: 104 mmol/L (ref 98–111)
Creatinine: 0.72 mg/dL (ref 0.44–1.00)
GFR, Est AFR Am: >60 mL/min (ref >60)
GFR, Estimated: >60 mL/min (ref >60)
Glucose, Bld: 120 mg/dL — ABNORMAL HIGH (ref 70–99)
Potassium: 3.4 mmol/L — ABNORMAL LOW (ref 3.5–5.1)
Sodium: 138 mmol/L (ref 135–145)
Total Bilirubin: 0.9 mg/dL (ref 0.3–1.2)
Total Protein: 6 g/dL — ABNORMAL LOW (ref 6.5–8.1)

## 2018-10-24 LAB — CBC WITH DIFFERENTIAL (CANCER CENTER ONLY)
Abs Immature Granulocytes: 0.01 10*3/uL (ref 0.00–0.07)
Basophils Absolute: 0 10*3/uL (ref 0.0–0.1)
Basophils Relative: 0 %
Eosinophils Absolute: 0 10*3/uL (ref 0.0–0.5)
Eosinophils Relative: 0 %
HCT: 33.1 % — ABNORMAL LOW (ref 36.0–46.0)
Hemoglobin: 10.8 g/dL — ABNORMAL LOW (ref 12.0–15.0)
Immature Granulocytes: 0 %
Lymphocytes Relative: 13 %
Lymphs Abs: 0.3 10*3/uL — ABNORMAL LOW (ref 0.7–4.0)
MCH: 30.4 pg (ref 26.0–34.0)
MCHC: 32.6 g/dL (ref 30.0–36.0)
MCV: 93.2 fL (ref 80.0–100.0)
Monocytes Absolute: 0.3 10*3/uL (ref 0.1–1.0)
Monocytes Relative: 13 %
Neutro Abs: 1.8 10*3/uL (ref 1.7–7.7)
Neutrophils Relative %: 74 %
Platelet Count: 148 10*3/uL — ABNORMAL LOW (ref 150–400)
RBC: 3.55 MIL/uL — ABNORMAL LOW (ref 3.87–5.11)
RDW: 16.2 % — ABNORMAL HIGH (ref 11.5–15.5)
WBC Count: 2.4 10*3/uL — ABNORMAL LOW (ref 4.0–10.5)
nRBC: 0 % (ref 0.0–0.2)

## 2018-10-24 NOTE — Progress Notes (Signed)
[place]   Telephone:([phone] Fax:(336) [phone]   Clinic Follow up Note   Patient Care Team: Fanny Bien, MD as PCP - General (Family Medicine) Leonie Man, MD as PCP - Cardiology (Cardiology) Ronnette Juniper, MD as Consulting Physician (Gastroenterology) Arna Snipe, RN as Oncology Nurse Navigator Michael Boston, MD as Consulting Physician (General Surgery) Truitt Merle, MD as Consulting Physician (Hematology) Kyung Rudd, MD as Consulting Physician (Radiation Oncology) 10/24/2018  I connected with Megan Munoz on 10/24/18 at 3:18 PM EDT by telephone visit and verified that I am speaking with the correct person using two identifiers.   I discussed the limitations, risks, security and privacy concerns of performing an evaluation and management service by telemedicine and the availability of in-person appointments. I also discussed with the patient that there may be a patient responsible charge related to this service. The patient expressed understanding and agreed to proceed.   Other persons participating in the visit and their role in the encounter: [name], daughter   Patient's location: home Provider's location: [facility] office  Chief Complaint: f/u rectal cancer  SUMMARY OF ONCOLOGIC HISTORY: Oncology History  Rectal cancer ([place])  06/01/2018 Imaging   COLONOSCOPY - Firm, nodular, thickened, raised area felt at the anal verge found on perianal exam. 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 Biopsied. - Two 6 to 8 mm polyps in the transverse colon, removed with a hot snare. Resected and retrieved. - One 7 mm polyp in the ascending colon, removed with a hot snare. Resected and retrieved. - One 6 mm polyp in the cecum, removed with a hot snare. Resected and retrieved. - One 4 mm polyp at the hepatic flexure, removed with a cold biopsy forceps. Resected and retrieved. - One 9 mm polyp in the descending colon, removed with a hot snare. Resected and retrieved. -  Diverticulosis in the sigmoid colon and in the descending colon.   06/01/2018 Initial Biopsy   Pathology from colonoscopy:  1. All colon polyps negative for malignancy. 2. Tubulovillous adenoma with at least high grade dysplasia at the rectal tumor. There were several foci highly suspicious but not definitive for invasive adenocarcinoma.    09/03/2018 Imaging   PELVIC MRI IMPRESSION: 1. 3.0 cm polypoid mass along the right anterolateral aspect of the anal verge, as described above. 2. Given the location, it is unclear whether this is more appropriately characterized as rectal cancer versus anal cancer. Regardless, given the clinical information, rectal cancer imaging characterization has been provided. 3. Rectal adenocarcinoma T stage: T3d 4. Rectal adenocarcinoma N stage:  N0 5. Distance from tumor to the internal anal sphincter is 0 cm.   09/19/2018 Miscellaneous   TUMOR BOARD: concurrent radiation therapy and chemotherapy with Xeloda   09/21/2018 Initial Diagnosis   Rectal cancer ([place])   09/21/2018 Cancer Staging   Staging form: Colon and Rectum, AJCC 8th Edition - Clinical: Stage IIA (cT3, cN0, cM0) - Signed by Truitt Merle, MD on 09/21/2018    Chemotherapy   ChemoRT with Xeloda beginning on 09/26/18, Xeloda 1500mg  q12h on days of radiation     Radiation Therapy   ChemoRT with Xeloda beginning on 09/26/18 by Dr Lisbeth Renshaw      CURRENT THERAPY: concurrent chemoRT with Xeloda 1500 mg BID M-F on days with radiation, began 09/26/18   INTERVAL HISTORY: [name] presents by phone with her daughter [name]. Before coming to the phone, Megan Munoz reports she "hit a wall" last week on 7/23 and was "horribly fatigued." She has  been using wheelchair at [facility] last few days and napping on first floor rather than going up stairs at home. She has to force po and activity. Her diet consists of ensure, 2-3 per day, and soup. When patient came to the phone, she reports feeling OK. Denies mucositis. Denies n/v. Stool  starts our hard and has to strain with BM, then becomes runny to complete the bowel movement. Has been using imodium and zofran this past week. Usually 2-3 BM per day. Daughter reports her face seems puffy, and legs are slightly swollen R>L which is her baseline. Takes lasix every other day. She began coughing on the phone, dry. Denies fever, chills, chest pain, dyspnea. Denies hand-foot redness or pain.    MEDICAL HISTORY:  Past Medical History:  Diagnosis Date  . Anxiety   . Atherosclerosis of aortic arch [facility]) April 2017   Noted on CT chest, calcified aortic atherosclerosis at the arch.  . Atrial fibrillation, permanent 03/2013   Previously on Atenolol for Rate - now on combination diltiazem and carvedilol for rate Control;  & Xarelto for AC. CHADS2VASC2 = 7.  . Cerebral infarction [facility]) 10/2008   MRI Brain 05/2014 for near syncope revealed: No acure infarct.  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   Mild small vessel disease type changes.  No intracranial hemorrhage.  Global atrophy.   . CHF (congestive heart failure) ([place])   . COPD (chronic obstructive pulmonary disease) ([place])   . Coronary atherosclerosis due to calcified coronary lesion April 2017   Noted on CT chest  . Dementia ([place])    following 2010 stroke  . Hyperlipemia   . Hypertension   . Pneumonia   . PONV (postoperative nausea and vomiting)   . Stroke [facility])    2010- patient has dementia related to stroke   . Tinea unguium   . Tremor   . Tuberculosis     SURGICAL HISTORY: Past Surgical History:  Procedure Laterality Date  . BIOPSY  06/01/2018   Procedure: BIOPSY;  Surgeon: Ronnette Juniper, MD;  Location: [facility];  Service: Gastroenterology;;  . BLADDER SURGERY    . CARPAL TUNNEL RELEASE Left   . COLONOSCOPY N/A 06/01/2018   Procedure: COLONOSCOPY;  Surgeon: Ronnette Juniper, MD;  Location: [facility];  Service:  Gastroenterology;  Laterality: N/A;  . ESOPHAGOGASTRODUODENOSCOPY (EGD) WITH PROPOFOL N/A 02/04/2018   Procedure: ESOPHAGOGASTRODUODENOSCOPY (EGD) WITH PROPOFOL;  Surgeon: Ronnette Juniper, MD;  Location: [place];  Service: Gastroenterology;  Laterality: N/A;  . FOOT SURGERY Right   . HERNIA REPAIR    . NASAL SINUS SURGERY    . POLYPECTOMY  06/01/2018   Procedure: POLYPECTOMY;  Surgeon: Ronnette Juniper, MD;  Location: [facility];  Service: Gastroenterology;;  . TOTAL ABDOMINAL HYSTERECTOMY      I have reviewed the social history and family history with the patient and they are unchanged from previous note.  ALLERGIES:  is allergic to tetanus toxoids.  MEDICATIONS:  Current Outpatient Medications  Medication Sig Dispense Refill  . albuterol (PROVENTIL) (2.5 MG/3ML) 0.083% nebulizer solution Take 3 mLs (2.5 mg total) by nebulization every 6 (six) hours as needed for wheezing or shortness of breath. 30 vial 1  . Azelastine HCl 137 MCG/SPRAY SOLN Place 2 sprays into both nostrils 2 (two) times daily. Use in each nostril as directed     . B Complex Vitamins (VITAMIN B COMPLEX PO) Take 1 tablet by mouth  daily.     Marland Kitchen BISACODYL 5 MG EC tablet TK AS DIRECTED    . calcitonin, salmon, (MIACALCIN) 200 UNIT/ACT nasal spray Place 1 spray into alternate nostrils daily.    . capecitabine (XELODA) 500 MG tablet Take 3 tablets (1,500 mg total) by mouth 2 (two) times daily after a meal. Take on days of radiation, Monday through Friday 168 tablet 0  . carvedilol (COREG) 12.5 MG tablet TAKE 1 TABLET TWICE A DAY WITH MEALS (DUE FOR FOLLOW UP APPOINTMENT IN [place]) (Patient not taking: Reported on 09/26/2018) 180 tablet 4  . Cholecalciferol (VITAMIN D3 SUPER STRENGTH) 2000 units CAPS Take 2,000 Units by mouth daily.     Marland Kitchen diltiazem (CARDIZEM CD) 120 MG 24 hr capsule TAKE 3 CAPSULES DAILY (PLEASE SCHEDULE AN APPOINTMENT FOR FURTHER REFILLS) (Patient taking differently: Take 360 mg by mouth daily. ) 360 capsule 4  .  docusate sodium (COLACE) 100 MG capsule Take 100-200 mg by mouth 2 (two) times daily as needed for mild constipation. Pt does take at least 100 mg daily    . donepezil (ARICEPT) 10 MG tablet Take 1 tablet (10 mg total) by mouth at bedtime. 90 tablet 3  . feeding supplement, ENSURE ENLIVE, (ENSURE ENLIVE) LIQD Take 237 mLs by mouth 3 (three) times daily between meals. (Patient taking differently: Take 237 mLs by mouth See admin instructions. 2-3  Times daily) 237 mL 12  . ferrous sulfate 325 (65 FE) MG tablet Take 1 tablet (325 mg total) by mouth every other day. 30 tablet 0  . FLUoxetine (PROZAC) 40 MG capsule Take 40 mg by mouth daily.     . Fluticasone-Umeclidin-Vilant (TRELEGY ELLIPTA) 100-62.5-25 MCG/INH AEPB Inhale 1 puff into the lungs daily.    . furosemide (LASIX) 20 MG tablet Take 1 tablet (20 mg total) by mouth daily as needed for fluid or edema. (Patient taking differently: Take 20 mg by mouth every other day. ) 30 tablet 6  . loratadine (CLARITIN) 10 MG tablet Take 10 mg by mouth daily.    . Melatonin 10 MG TABS Take 10 mg by mouth at bedtime.     . memantine (NAMENDA) 10 MG tablet Take 1 tablet (10 mg total) by mouth 2 (two) times daily. 180 tablet 3  . Multiple Vitamin (MULTIVITAMIN) capsule Take 1 capsule by mouth daily.    . ondansetron (ZOFRAN) 8 MG tablet TAKE 0.5-1 TABLET (4-8 MG TOTAL) BY MOUTH EVERY 8 (EIGHT) HOURS AS NEEDED FOR NAUSEA OR VOMITING. 30 tablet 0  . potassium chloride SA (K-DUR) 20 MEQ tablet Take 1 tablet (20 mEq total) by mouth daily. 30 tablet 0  . rivaroxaban (XARELTO) 10 MG TABS tablet Take 1 tablet (10 mg total) by mouth daily. 30 tablet 1  . simvastatin (ZOCOR) 40 MG tablet Take 40 mg by mouth daily.     No current facility-administered medications for this visit.     PHYSICAL EXAMINATION: ECOG PERFORMANCE STATUS: 2 - Symptomatic, <50% confined to bed No vital signs for this phone visit Patient appears sleepy but well over the phone. She coughed 3 times  on the phone. Speech is intact, non-pressured. Mood and affect appear normal for situation   LABORATORY DATA:  I have reviewed the data as listed CBC Latest Ref Rng & Units 10/24/2018 10/17/2018 10/10/2018  WBC 4.0 - 10.5 K/uL 2.4(L) 2.6(L) 3.3(L)  Hemoglobin 12.0 - 15.0 g/dL 10.8(L) 11.6(L) 12.3  Hematocrit 36.0 - 46.0 % 33.1(L) 36.4 38.5  Platelets 150 - 400 K/uL 148(L)  133(L) 124(L)     CMP Latest Ref Rng & Units 10/24/2018 10/17/2018 10/10/2018  Glucose 70 - 99 mg/dL 120(H) 96 77  BUN 8 - 23 mg/dL 17 14 16   Creatinine 0.44 - 1.00 mg/dL 0.72 0.72 0.75  Sodium 135 - 145 mmol/L 138 143 139  Potassium 3.5 - 5.1 mmol/L 3.4(L) 3.7 3.2(L)  Chloride 98 - 111 mmol/L 104 106 104  CO2 22 - 32 mmol/L 26 28 26   Calcium 8.9 - 10.3 mg/dL 8.5(L) 8.6(L) 8.8(L)  Total Protein 6.5 - 8.1 g/dL 6.0(L) 6.3(L) 7.2  Total Bilirubin 0.3 - 1.2 mg/dL 0.9 0.6 0.6  Alkaline Phos 38 - 126 U/L 67 76 86  AST 15 - 41 U/L 22 22 25   ALT 0 - 44 U/L 13 14 15       RADIOGRAPHIC STUDIES: I have personally reviewed the radiological images as listed and agreed with the findings in the report. No results found.   ASSESSMENT & PLAN: 85 yo female with   1. Distal rectal adenocarcinoma, cT3N0M0, stage IIA -She began concurrent chemoRT with Xeloda1500 mg BID on M-F on 09/26/18.  -tolerating fairly well overall  2. HTN, CHF - on cardizem, lasix 20 mg every other day    3. H/O stroke, dementia  -On Xarelto 10 mg daily, Aricept, Namenda; followed by Dr. Krista Blue [facility]  4. Depression, anxiety  -On Prozac 40 mg daily  Dispo: Ms. Engdahl appears stable. She continues chemoRT with Xeloda 1500 mg BID on M-F. She developed increased fatigue with difficulty with ADLs, leg swelling, and new cough (which started on the phone). She is afebrile, no dyspnea. BM consistency is stable, but she strains at times. She will hold imodium for now, and increase fiber and water intake. I encouraged her to eat more protein and calories, and  increase K in her diet. Labs reviewed, WBC 2.4, stable; PLT 148; Hg 10.8. Cr. Normal. Due to her increased fatigue, leg swelling, and cough I recommend to reduce dose to 1000 mg tonight and tomorrow AM. I scheduled an in-person f/u tomorrow morning. May need to adjust dose based on my eval. She and her daughter agree. Anticipate completing chemoRT on 11/03/18.  All questions were answered. No barriers to learning was detected. I spent 20 minutes non face to face time in today's encounter. An in-person evaluation was scheduled for 10/25/18 to f/u on new issues from today's visit.     Alla Feeling, NP 10/24/18

## 2018-10-25 ENCOUNTER — Telehealth: Payer: Self-pay | Admitting: Nurse Practitioner

## 2018-10-25 ENCOUNTER — Encounter: Payer: Self-pay | Admitting: Nurse Practitioner

## 2018-10-25 ENCOUNTER — Ambulatory Visit
Admission: RE | Admit: 2018-10-25 | Discharge: 2018-10-25 | Disposition: A | Discharge status: Home / Self Care | Payer: Medicare Other | Source: Ambulatory Visit | Attending: Radiation Oncology | Admitting: Radiation Oncology

## 2018-10-25 ENCOUNTER — Inpatient Hospital Stay (HOSPITAL_BASED_OUTPATIENT_CLINIC_OR_DEPARTMENT_OTHER): Payer: Medicare Other | Admitting: Nurse Practitioner

## 2018-10-25 ENCOUNTER — Other Ambulatory Visit: Payer: Self-pay

## 2018-10-25 ENCOUNTER — Inpatient Hospital Stay: Payer: Medicare Other

## 2018-10-25 ENCOUNTER — Telehealth: Payer: Self-pay | Admitting: Hematology

## 2018-10-25 ENCOUNTER — Ambulatory Visit (HOSPITAL_COMMUNITY)
Admission: RE | Admit: 2018-10-25 | Discharge: 2018-10-25 | Disposition: A | Discharge status: Home / Self Care | Payer: Medicare Other | Source: Ambulatory Visit | Attending: Nurse Practitioner | Admitting: Nurse Practitioner

## 2018-10-25 VITALS — BP 119/55 | HR 87 | Temp 99.1°F | Resp 24 | Ht 67.0 in | Wt 140.2 lb

## 2018-10-25 DIAGNOSIS — Z7901 Long term (current) use of anticoagulants: Secondary | ICD-10-CM | POA: Diagnosis not present

## 2018-10-25 DIAGNOSIS — R6883 Chills (without fever): Secondary | ICD-10-CM

## 2018-10-25 DIAGNOSIS — J449 Chronic obstructive pulmonary disease, unspecified: Secondary | ICD-10-CM

## 2018-10-25 DIAGNOSIS — C20 Malignant neoplasm of rectum: Secondary | ICD-10-CM

## 2018-10-25 DIAGNOSIS — R0682 Tachypnea, not elsewhere classified: Secondary | ICD-10-CM | POA: Diagnosis not present

## 2018-10-25 DIAGNOSIS — Z510 Encounter for antineoplastic radiation therapy: Secondary | ICD-10-CM | POA: Diagnosis not present

## 2018-10-25 DIAGNOSIS — R0600 Dyspnea, unspecified: Secondary | ICD-10-CM | POA: Diagnosis not present

## 2018-10-25 DIAGNOSIS — Z8673 Personal history of transient ischemic attack (TIA), and cerebral infarction without residual deficits: Secondary | ICD-10-CM | POA: Diagnosis not present

## 2018-10-25 DIAGNOSIS — R0602 Shortness of breath: Secondary | ICD-10-CM | POA: Diagnosis not present

## 2018-10-25 DIAGNOSIS — I10 Essential (primary) hypertension: Secondary | ICD-10-CM

## 2018-10-25 DIAGNOSIS — F329 Major depressive disorder, single episode, unspecified: Secondary | ICD-10-CM | POA: Diagnosis not present

## 2018-10-25 DIAGNOSIS — E860 Dehydration: Secondary | ICD-10-CM | POA: Diagnosis not present

## 2018-10-25 DIAGNOSIS — F419 Anxiety disorder, unspecified: Secondary | ICD-10-CM | POA: Diagnosis not present

## 2018-10-25 LAB — CBC WITH DIFFERENTIAL (CANCER CENTER ONLY)
Abs Immature Granulocytes: 0.01 10*3/uL (ref 0.00–0.07)
Basophils Absolute: 0 10*3/uL (ref 0.0–0.1)
Basophils Relative: 0 %
Eosinophils Absolute: 0 10*3/uL (ref 0.0–0.5)
Eosinophils Relative: 1 %
HCT: 34 % — ABNORMAL LOW (ref 36.0–46.0)
Hemoglobin: 11.3 g/dL — ABNORMAL LOW (ref 12.0–15.0)
Immature Granulocytes: 0 %
Lymphocytes Relative: 14 %
Lymphs Abs: 0.3 10*3/uL — ABNORMAL LOW (ref 0.7–4.0)
MCH: 30.6 pg (ref 26.0–34.0)
MCHC: 33.2 g/dL (ref 30.0–36.0)
MCV: 92.1 fL (ref 80.0–100.0)
Monocytes Absolute: 0.4 10*3/uL (ref 0.1–1.0)
Monocytes Relative: 15 %
Neutro Abs: 1.6 10*3/uL — ABNORMAL LOW (ref 1.7–7.7)
Neutrophils Relative %: 70 %
Platelet Count: 152 10*3/uL (ref 150–400)
RBC: 3.69 MIL/uL — ABNORMAL LOW (ref 3.87–5.11)
RDW: 16.9 % — ABNORMAL HIGH (ref 11.5–15.5)
WBC Count: 2.4 10*3/uL — ABNORMAL LOW (ref 4.0–10.5)
nRBC: 0 % (ref 0.0–0.2)

## 2018-10-25 LAB — CMP (CANCER CENTER ONLY)
ALT: 14 U/L (ref 0–44)
AST: 25 U/L (ref 15–41)
Albumin: 3.5 g/dL (ref 3.5–5.0)
Alkaline Phosphatase: 67 U/L (ref 38–126)
Anion gap: 9 (ref 5–15)
BUN: 18 mg/dL (ref 8–23)
CO2: 25 mmol/L (ref 22–32)
Calcium: 8.5 mg/dL — ABNORMAL LOW (ref 8.9–10.3)
Chloride: 104 mmol/L (ref 98–111)
Creatinine: 0.7 mg/dL (ref 0.44–1.00)
GFR, Est AFR Am: >60 mL/min (ref >60)
GFR, Estimated: >60 mL/min (ref >60)
Glucose, Bld: 103 mg/dL — ABNORMAL HIGH (ref 70–99)
Potassium: 3.1 mmol/L — ABNORMAL LOW (ref 3.5–5.1)
Sodium: 138 mmol/L (ref 135–145)
Total Bilirubin: 1 mg/dL (ref 0.3–1.2)
Total Protein: 6.1 g/dL — ABNORMAL LOW (ref 6.5–8.1)

## 2018-10-25 MED ORDER — SODIUM CHLORIDE 0.9 % IV SOLN
Freq: Once | INTRAVENOUS | Status: DC
  Filled 2018-10-25: qty 250

## 2018-10-25 NOTE — Progress Notes (Addendum)
[place]   Telephone:(336) [phone] Fax:(336) [phone]   Clinic Follow up Note   Patient Care Team: Fanny Bien, MD as PCP - General (Family Medicine) Leonie Man, MD as PCP - Cardiology (Cardiology) Ronnette Juniper, MD as Consulting Physician (Gastroenterology) Arna Snipe, RN as Oncology Nurse Navigator Michael Boston, MD as Consulting Physician (General Surgery) Truitt Merle, MD as Consulting Physician (Hematology) Kyung Rudd, MD as Consulting Physician (Radiation Oncology) 10/25/2018  CHIEF COMPLAINT: fatigue   SUMMARY OF ONCOLOGIC HISTORY: Oncology History  Rectal cancer ([place])  06/01/2018 Imaging   COLONOSCOPY - Firm, nodular, thickened, raised area felt at the anal verge found on perianal exam. 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 Biopsied. - Two 6 to 8 mm polyps in the transverse colon, removed with a hot snare. Resected and retrieved. - One 7 mm polyp in the ascending colon, removed with a hot snare. Resected and retrieved. - One 6 mm polyp in the cecum, removed with a hot snare. Resected and retrieved. - One 4 mm polyp at the hepatic flexure, removed with a cold biopsy forceps. Resected and retrieved. - One 9 mm polyp in the descending colon, removed with a hot snare. Resected and retrieved. - Diverticulosis in the sigmoid colon and in the descending colon.   06/01/2018 Initial Biopsy   Pathology from colonoscopy:  1. All colon polyps negative for malignancy. 2. Tubulovillous adenoma with at least high grade dysplasia at the rectal tumor. There were several foci highly suspicious but not definitive for invasive adenocarcinoma.    09/03/2018 Imaging   PELVIC MRI IMPRESSION: 1. 3.0 cm polypoid mass along the right anterolateral aspect of the anal verge, as described above. 2. Given the location, it is unclear whether this is more appropriately characterized as rectal cancer versus anal cancer. Regardless, given the clinical information, rectal cancer  imaging characterization has been provided. 3. Rectal adenocarcinoma T stage: T3d 4. Rectal adenocarcinoma N stage:  N0 5. Distance from tumor to the internal anal sphincter is 0 cm.   09/19/2018 Miscellaneous   TUMOR BOARD: concurrent radiation therapy and chemotherapy with Xeloda   09/21/2018 Initial Diagnosis   Rectal cancer ([place])   09/21/2018 Cancer Staging   Staging form: Colon and Rectum, AJCC 8th Edition - Clinical: Stage IIA (cT3, cN0, cM0) - Signed by Truitt Merle, MD on 09/21/2018    Chemotherapy   ChemoRT with Xeloda beginning on 09/26/18, Xeloda 1500mg  q12h on days of radiation     Radiation Therapy   ChemoRT with Xeloda beginning on 09/26/18 by Dr Lisbeth Renshaw      CURRENT THERAPY: concurrent chemoRT with Xeloda 1500 mg BID M-F on days with radiation, began 09/26/18  INTERVAL HISTORY: [name] returns with her daughter for in-person visit. She took 1000 mg Xeloda last night and this morning as instructed. She had a large liquid BM accident in her bed overnight. She used imodium and stool output improved. She has no appetite. Diet yesterday consisted of 3 ensure and milk only. She denies n/v. Her daughter reports abrupt fatigue that started 4-5 days ago, tachypnea and dyspnea started today. She has shaking chills in the exam room today, no fever. She has intermittent dry cough, not every day. Has been feeling more cold at home requiring blankets and heating pad, but no chills or fever at home. Over last few days she has swelling in feet and legs. Has been taking lasix every other day for 1.5 to 2 years.    MEDICAL HISTORY:  Past Medical History:  Diagnosis Date  . Anxiety   . Atherosclerosis of aortic arch [facility]) April 2017   Noted on CT chest, calcified aortic atherosclerosis at the arch.  . Atrial fibrillation, permanent 03/2013   Previously on Atenolol for Rate - now on combination diltiazem and carvedilol for rate Control;  & Xarelto for AC. CHADS2VASC2 = 7.  . Cerebral infarction  [facility]) 10/2008   MRI Brain 05/2014 for near syncope revealed: No acure infarct.  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   Mild small vessel disease type changes.  No intracranial hemorrhage.  Global atrophy.   . CHF (congestive heart failure) ([place])   . COPD (chronic obstructive pulmonary disease) ([place])   . Coronary atherosclerosis due to calcified coronary lesion April 2017   Noted on CT chest  . Dementia ([place])    following 2010 stroke  . Hyperlipemia   . Hypertension   . Pneumonia   . PONV (postoperative nausea and vomiting)   . Stroke [facility])    2010- patient has dementia related to stroke   . Tinea unguium   . Tremor   . Tuberculosis     SURGICAL HISTORY: Past Surgical History:  Procedure Laterality Date  . BIOPSY  06/01/2018   Procedure: BIOPSY;  Surgeon: Ronnette Juniper, MD;  Location: [facility];  Service: Gastroenterology;;  . BLADDER SURGERY    . CARPAL TUNNEL RELEASE Left   . COLONOSCOPY N/A 06/01/2018   Procedure: COLONOSCOPY;  Surgeon: Ronnette Juniper, MD;  Location: [facility];  Service: Gastroenterology;  Laterality: N/A;  . ESOPHAGOGASTRODUODENOSCOPY (EGD) WITH PROPOFOL N/A 02/04/2018   Procedure: ESOPHAGOGASTRODUODENOSCOPY (EGD) WITH PROPOFOL;  Surgeon: Ronnette Juniper, MD;  Location: [place];  Service: Gastroenterology;  Laterality: N/A;  . FOOT SURGERY Right   . HERNIA REPAIR    . NASAL SINUS SURGERY    . POLYPECTOMY  06/01/2018   Procedure: POLYPECTOMY;  Surgeon: Ronnette Juniper, MD;  Location: [facility];  Service: Gastroenterology;;  . TOTAL ABDOMINAL HYSTERECTOMY      I have reviewed the social history and family history with the patient and they are unchanged from previous note.  ALLERGIES:  is allergic to tetanus toxoids.  MEDICATIONS:  Current Outpatient Medications  Medication Sig Dispense Refill  . albuterol (PROVENTIL) (2.5 MG/3ML) 0.083% nebulizer solution Take 3  mLs (2.5 mg total) by nebulization every 6 (six) hours as needed for wheezing or shortness of breath. 30 vial 1  . Azelastine HCl 137 MCG/SPRAY SOLN Place 2 sprays into both nostrils 2 (two) times daily. Use in each nostril as directed     . B Complex Vitamins (VITAMIN B COMPLEX PO) Take 1 tablet by mouth daily.     Marland Kitchen BISACODYL 5 MG EC tablet TK AS DIRECTED    . calcitonin, salmon, (MIACALCIN) 200 UNIT/ACT nasal spray Place 1 spray into alternate nostrils daily.    . capecitabine (XELODA) 500 MG tablet Take 3 tablets (1,500 mg total) by mouth 2 (two) times daily after a meal. Take on days of radiation, Monday through Friday 168 tablet 0  . Cholecalciferol (VITAMIN D3 SUPER STRENGTH) 2000 units CAPS Take 2,000 Units by mouth daily.     Marland Kitchen diltiazem (CARDIZEM CD) 120 MG 24 hr capsule TAKE 3 CAPSULES DAILY (PLEASE SCHEDULE AN APPOINTMENT FOR FURTHER REFILLS) (Patient taking differently: Take 360 mg by mouth daily. ) 360 capsule 4  . donepezil (ARICEPT) 10 MG tablet Take  1 tablet (10 mg total) by mouth at bedtime. 90 tablet 3  . feeding supplement, ENSURE ENLIVE, (ENSURE ENLIVE) LIQD Take 237 mLs by mouth 3 (three) times daily between meals. (Patient taking differently: Take 237 mLs by mouth See admin instructions. 2-3  Times daily) 237 mL 12  . ferrous sulfate 325 (65 FE) MG tablet Take 1 tablet (325 mg total) by mouth every other day. 30 tablet 0  . FLUoxetine (PROZAC) 40 MG capsule Take 40 mg by mouth daily.     . Fluticasone-Umeclidin-Vilant (TRELEGY ELLIPTA) 100-62.5-25 MCG/INH AEPB Inhale 1 puff into the lungs daily.    . furosemide (LASIX) 20 MG tablet Take 1 tablet (20 mg total) by mouth daily as needed for fluid or edema. (Patient taking differently: Take 20 mg by mouth every other day. ) 30 tablet 6  . loratadine (CLARITIN) 10 MG tablet Take 10 mg by mouth daily.    . Melatonin 10 MG TABS Take 10 mg by mouth at bedtime.     . memantine (NAMENDA) 10 MG tablet Take 1 tablet (10 mg total) by mouth  2 (two) times daily. 180 tablet 3  . Multiple Vitamin (MULTIVITAMIN) capsule Take 1 capsule by mouth daily.    . ondansetron (ZOFRAN) 8 MG tablet TAKE 0.5-1 TABLET (4-8 MG TOTAL) BY MOUTH EVERY 8 (EIGHT) HOURS AS NEEDED FOR NAUSEA OR VOMITING. 30 tablet 0  . potassium chloride SA (K-DUR) 20 MEQ tablet Take 1 tablet (20 mEq total) by mouth daily. 30 tablet 0  . rivaroxaban (XARELTO) 10 MG TABS tablet Take 1 tablet (10 mg total) by mouth daily. 30 tablet 1  . simvastatin (ZOCOR) 40 MG tablet Take 40 mg by mouth daily.    . carvedilol (COREG) 12.5 MG tablet TAKE 1 TABLET TWICE A DAY WITH MEALS (DUE FOR FOLLOW UP APPOINTMENT IN [place]) (Patient not taking: Reported on 09/26/2018) 180 tablet 4  . docusate sodium (COLACE) 100 MG capsule Take 100-200 mg by mouth 2 (two) times daily as needed for mild constipation. Pt does take at least 100 mg daily     Current Facility-Administered Medications  Medication Dose Route Frequency Provider Last Rate Last Dose  . 0.9 %  sodium chloride infusion   Intravenous Once Alla Feeling, NP        PHYSICAL EXAMINATION: ECOG PERFORMANCE STATUS: 2-3  Vitals:   10/25/18 0951  BP: (!) 119/55  Pulse: 87  Resp: (!) 24  Temp: 99.1 F (37.3 C)  SpO2: 100%   Filed Weights   10/25/18 0951  Weight: 140 lb 3.2 oz (63.6 kg)    GENERAL:alert, no distress. Chills  SKIN: no obvious rash  EYES:  sclera clear OROPHARYNX: no thrush or ulcers  LUNGS: clear, mild increase work of breathing  HEART: Afib. Mild pedal and LE edema  Musculoskeletal:no cyanosis of digits  NEURO: alert & oriented x 3 with fluent speech, generalized weakness  LABORATORY DATA:  I have reviewed the data as listed CBC Latest Ref Rng & Units 10/25/2018 10/24/2018 10/17/2018  WBC 4.0 - 10.5 K/uL 2.4(L) 2.4(L) 2.6(L)  Hemoglobin 12.0 - 15.0 g/dL 11.3(L) 10.8(L) 11.6(L)  Hematocrit 36.0 - 46.0 % 34.0(L) 33.1(L) 36.4  Platelets 150 - 400 K/uL 152 148(L) 133(L)     CMP Latest Ref Rng & Units  10/25/2018 10/24/2018 10/17/2018  Glucose 70 - 99 mg/dL 103(H) 120(H) 96  BUN 8 - 23 mg/dL 18 17 14   Creatinine 0.44 - 1.00 mg/dL 0.70 0.72 0.72  Sodium 135 -  145 mmol/L 138 138 143  Potassium 3.5 - 5.1 mmol/L 3.1(L) 3.4(L) 3.7  Chloride 98 - 111 mmol/L 104 104 106  CO2 22 - 32 mmol/L 25 26 28   Calcium 8.9 - 10.3 mg/dL 8.5(L) 8.5(L) 8.6(L)  Total Protein 6.5 - 8.1 g/dL 6.1(L) 6.0(L) 6.3(L)  Total Bilirubin 0.3 - 1.2 mg/dL 1.0 0.9 0.6  Alkaline Phos 38 - 126 U/L 67 67 76  AST 15 - 41 U/L 25 22 22   ALT 0 - 44 U/L 14 13 14       RADIOGRAPHIC STUDIES: I have personally reviewed the radiological images as listed and agreed with the findings in the report. No results found.   ASSESSMENT & PLAN: 85 yo female   1. Fatigue, dyspnea, tachypnea, chills -her fatigue is likely secondary to chemoRT and poor nutrition. She appears somewhat dehydrated. Will hold lasix x2 days and give 500 CC NS -encouraged her to increase nutrition, high calories  -she is afebrile. We reviewed chemoRT increases her risk for infection. Will obtain urine, c.dif, and blood cultures today. She will get chest xray done today as well. She may need antibiotics.  -due to her severe fatigue, hold Xeloda the rest of this week -after radiation, patient returned to exam room appearing somewhat improved. She no longer had chills, tachypnea, or dyspnea. Respiratory changes likely related to chills. She was unable to provide urine and stool sample. She felt fatigued and adamant that she wanted to go home and rest.  -she will return on 7/29 to complete ID work up with c.dif test and urine culture and to receive IVF, patient and her daughter agree. -will hold antibiotics for now. -will f/u on Thursday via phone call, in-person visit on 8/3 -continue radiation -the patient was seen with Dr. Burr Medico today  2. Distal rectal adenocarcinoma, cT3N0M0, stage IIA -She began concurrent chemoRT with Xeloda1500 mg BID on M-F on 09/26/18.  -tolerating fairly well overall until recently, with accumulating severe fatigue. She also had large liquid BM overnight, she took xeloda on empty stomach. We reviewed dosing instructions -Due to fatigue and chills she will hold Xeloda for the rest of the week, continue radiation   3. Hypokalemia -she is on potassium supplement when she takes lasix every other day.  -K 3.1 today, I recommend she take 1 tab BID for now  4. Afib -on Xarelto  5. CHF -controlled on cardizem and lasix  -has been on lasix every other day for 1-2 years -she has mild LE edema, but given her symptoms lately and poor nutrition and hydration, will hold lasix for 2 days.   PLAN: -Blood, urine cultures, and c.dif test  -500 cc NS over 1.5 hours on 7/29 -Chest xray  -Hold Xeloda for the rest of this week  -Hold lasix x2 days -Continue radiation therapy  -Phone f/u 7/30 -In-person f/u on 8/3  Orders Placed This Encounter  Procedures  . Urine Culture    Standing Status:   Future    Number of Occurrences:   1    Standing Expiration Date:   10/25/2019  . Culture, Blood    Standing Status:   Future    Standing Expiration Date:   10/25/2019  . Culture, Stool    Standing Status:   Future    Standing Expiration Date:   10/25/2019  . C difficile quick screen w PCR reflex    Standing Status:   Future    Standing Expiration Date:   10/25/2019  . DG Chest 2  View    Standing Status:   Future    Number of Occurrences:   1    Standing Expiration Date:   10/25/2019    Order Specific Question:   Reason for Exam (SYMPTOM  OR DIAGNOSIS REQUIRED)    Answer:   dyspnea    Order Specific Question:   Preferred imaging location?    Answer:   [facility]    Order Specific Question:   Radiology Contrast Protocol - do NOT remove file path    Answer:   [email or web address]  . Urinalysis, Complete w Microscopic    Standing Status:   Future    Number of Occurrences:   1    Standing  Expiration Date:   10/25/2019   All questions were answered. The patient knows to call the clinic with any problems, questions or concerns. No barriers to learning was detected.     Alla Feeling, NP 10/25/18   Addendum  I have seen the patient, examined her. I agree with the assessment and and plan and have edited the notes.   Pt has developed chills and diarrhea since last night, she had shaking chills in our clinic today, will get ID work up to determine if she needs antibiotics. Lab reviewed, mild leukopenia, will give gentle IVF today, and hold Xeloda for this week. Close f/u.  Truitt Merle  10/25/2018

## 2018-10-25 NOTE — Telephone Encounter (Signed)
Scheduled appt per 7/28 shc message- spoke with daughter and she is aware of appt date and time

## 2018-10-25 NOTE — Telephone Encounter (Signed)
No los per 7/27. °

## 2018-10-26 ENCOUNTER — Telehealth: Payer: Self-pay | Admitting: Radiation Oncology

## 2018-10-26 ENCOUNTER — Other Ambulatory Visit: Payer: Self-pay | Admitting: Nurse Practitioner

## 2018-10-26 ENCOUNTER — Ambulatory Visit
Admission: RE | Admit: 2018-10-26 | Discharge: 2018-10-26 | Disposition: A | Discharge status: Home / Self Care | Payer: Medicare Other | Source: Ambulatory Visit | Attending: Radiation Oncology | Admitting: Radiation Oncology

## 2018-10-26 ENCOUNTER — Other Ambulatory Visit: Payer: Self-pay

## 2018-10-26 ENCOUNTER — Inpatient Hospital Stay: Payer: Medicare Other

## 2018-10-26 ENCOUNTER — Telehealth: Payer: Self-pay | Admitting: Nurse Practitioner

## 2018-10-26 DIAGNOSIS — R6883 Chills (without fever): Secondary | ICD-10-CM

## 2018-10-26 DIAGNOSIS — Z8673 Personal history of transient ischemic attack (TIA), and cerebral infarction without residual deficits: Secondary | ICD-10-CM | POA: Diagnosis not present

## 2018-10-26 DIAGNOSIS — N3001 Acute cystitis with hematuria: Secondary | ICD-10-CM

## 2018-10-26 DIAGNOSIS — C20 Malignant neoplasm of rectum: Secondary | ICD-10-CM | POA: Diagnosis not present

## 2018-10-26 DIAGNOSIS — Z7901 Long term (current) use of anticoagulants: Secondary | ICD-10-CM | POA: Diagnosis not present

## 2018-10-26 DIAGNOSIS — F419 Anxiety disorder, unspecified: Secondary | ICD-10-CM | POA: Diagnosis not present

## 2018-10-26 DIAGNOSIS — Z510 Encounter for antineoplastic radiation therapy: Secondary | ICD-10-CM | POA: Diagnosis not present

## 2018-10-26 DIAGNOSIS — F329 Major depressive disorder, single episode, unspecified: Secondary | ICD-10-CM | POA: Diagnosis not present

## 2018-10-26 DIAGNOSIS — E860 Dehydration: Secondary | ICD-10-CM | POA: Diagnosis not present

## 2018-10-26 LAB — URINALYSIS, COMPLETE (UACMP) WITH MICROSCOPIC
Bilirubin Urine: NEGATIVE
Glucose, UA: NEGATIVE mg/dL
Ketones, ur: 20 mg/dL — AB
Nitrite: NEGATIVE
Protein, ur: 100 mg/dL — AB
Specific Gravity, Urine: 1.02 (ref 1.005–1.030)
WBC, UA: >50 WBC/hpf — ABNORMAL HIGH (ref 0–5)
pH: 5 (ref 5.0–8.0)

## 2018-10-26 MED ORDER — SODIUM CHLORIDE 0.9 % IV SOLN
Freq: Once | INTRAVENOUS | Status: AC
  Administered 2018-10-26: 09:00:00 via INTRAVENOUS
  Filled 2018-10-26: qty 250

## 2018-10-26 MED ORDER — SULFAMETHOXAZOLE-TRIMETHOPRIM 800-160 MG PO TABS: 1.0000 | ORAL_TABLET | Freq: Two times a day (BID) | ORAL | 0 refills | Status: DC

## 2018-10-26 NOTE — Telephone Encounter (Signed)
I called and spoke with the patient's daughter Megan Munoz after seeing the patient. She has some increasing desquamation in the gluteal cleft right greater than left. She was examined and has about 1.5 cm area of moist desquamation with a satellite location of dry desquamation that's about 5 mm on the right gluteal cleft. On the left gluteal cleft, she has a 7-8 mm fluid filled blister and mild skin avulsion. After confirming her medications and allergies, Silvadene was applied and the jar was given to the patient. She will finish her IVF in the infusion room and will return for treatment tomorrow. I made sure her daughter was in agreement with this plan.

## 2018-10-26 NOTE — Progress Notes (Signed)
UA consistent with UTI, will treat empirically with bactrim DS 1 tab BID x3 days. No antibiotic allergies. Sent to pharmacy. Culture is pending. She denies dysuria, frequency, urgency. I encouraged patient to hydrate, monitor for increased GI upset on antibiotics. Wrote down instructions for her daughter as well. Patient verbalizes understanding.

## 2018-10-26 NOTE — Telephone Encounter (Signed)
Scheduled appt per 7/28 los.  Spoke with patient daughter and she is aware of the appt date and time.

## 2018-10-26 NOTE — Patient Instructions (Signed)
Dehydration, Adult  Dehydration is when there is not enough fluid or water in your body. This happens when you lose more fluids than you take in. Dehydration can range from mild to very bad. It should be treated right away to keep it from getting very bad. Symptoms of mild dehydration may include:  Thirst.  Dry lips.  Slightly dry mouth.  Dry, warm skin.  Dizziness. Symptoms of moderate dehydration may include:  Very dry mouth.  Muscle cramps.  Dark pee (urine). Pee may be the color of tea.  Your body making less pee.  Your eyes making fewer tears.  Heartbeat that is uneven or faster than normal (palpitations).  Headache.  Light-headedness, especially when you stand up from sitting.  Fainting (syncope). Symptoms of very bad dehydration may include:  Changes in skin, such as: ? Cold and clammy skin. ? Blotchy (mottled) or pale skin. ? Skin that does not quickly return to normal after being lightly pinched and let go (poor skin turgor).  Changes in body fluids, such as: ? Feeling very thirsty. ? Your eyes making fewer tears. ? Not sweating when body temperature is high, such as in hot weather. ? Your body making very little pee.  Changes in vital signs, such as: ? Weak pulse. ? Pulse that is more than 100 beats a minute when you are sitting still. ? Fast breathing. ? Low blood pressure.  Other changes, such as: ? Sunken eyes. ? Cold hands and feet. ? Confusion. ? Lack of energy (lethargy). ? Trouble waking up from sleep. ? Short-term weight loss. ? Unconsciousness. Follow these instructions at home:   If told by your doctor, drink an ORS: ? Make an ORS by using instructions on the package. ? Start by drinking small amounts, about  cup (120 mL) every 5-10 minutes. ? Slowly drink more until you have had the amount that your doctor said to have.  Drink enough clear fluid to keep your pee clear or pale yellow. If you were told to drink an ORS, finish the  ORS first, then start slowly drinking clear fluids. Drink fluids such as: ? Water. Do not drink only water by itself. Doing that can make the salt (sodium) level in your body get too low (hyponatremia). ? Ice chips. ? Fruit juice that you have added water to (diluted). ? Low-calorie sports drinks.  Avoid: ? Alcohol. ? Drinks that have a lot of sugar. These include high-calorie sports drinks, fruit juice that does not have water added, and soda. ? Caffeine. ? Foods that are greasy or have a lot of fat or sugar.  Take over-the-counter and prescription medicines only as told by your doctor.  Do not take salt tablets. Doing that can make the salt level in your body get too high (hypernatremia).  Eat foods that have minerals (electrolytes). Examples include bananas, oranges, potatoes, tomatoes, and spinach.  Keep all follow-up visits as told by your doctor. This is important. Contact a doctor if:  You have belly (abdominal) pain that: ? Gets worse. ? Stays in one area (localizes).  You have a rash.  You have a stiff neck.  You get angry or annoyed more easily than normal (irritability).  You are more sleepy than normal.  You have a harder time waking up than normal.  You feel: ? Weak. ? Dizzy. ? Very thirsty.  You have peed (urinated) only a small amount of very dark pee during 6-8 hours. Get help right away if:  You have   symptoms of very bad dehydration.  You cannot drink fluids without throwing up (vomiting).  Your symptoms get worse with treatment.  You have a fever.  You have a very bad headache.  You are throwing up or having watery poop (diarrhea) and it: ? Gets worse. ? Does not go away.  You have blood or something green (bile) in your throw-up.  You have blood in your poop (stool). This may cause poop to look black and tarry.  You have not peed in 6-8 hours.  You pass out (faint).  Your heart rate when you are sitting still is more than 100 beats a  minute.  You have trouble breathing. This information is not intended to replace advice given to you by your health care provider. Make sure you discuss any questions you have with your health care provider. Document Released: 01/10/2009 Document Revised: 02/26/2017 Document Reviewed: 05/10/2015 Elsevier Patient Education  2020 [employer]  

## 2018-10-27 ENCOUNTER — Encounter: Payer: Self-pay | Admitting: Nurse Practitioner

## 2018-10-27 ENCOUNTER — Ambulatory Visit
Admission: RE | Admit: 2018-10-27 | Discharge: 2018-10-27 | Disposition: A | Discharge status: Home / Self Care | Payer: Medicare Other | Source: Ambulatory Visit | Attending: Radiation Oncology | Admitting: Radiation Oncology

## 2018-10-27 ENCOUNTER — Inpatient Hospital Stay (HOSPITAL_BASED_OUTPATIENT_CLINIC_OR_DEPARTMENT_OTHER): Payer: Medicare Other | Admitting: Nurse Practitioner

## 2018-10-27 ENCOUNTER — Ambulatory Visit: Payer: Medicare Other | Admitting: Nurse Practitioner

## 2018-10-27 ENCOUNTER — Telehealth: Payer: Self-pay | Admitting: Nurse Practitioner

## 2018-10-27 DIAGNOSIS — N3001 Acute cystitis with hematuria: Secondary | ICD-10-CM

## 2018-10-27 DIAGNOSIS — C20 Malignant neoplasm of rectum: Secondary | ICD-10-CM

## 2018-10-27 DIAGNOSIS — Z79899 Other long term (current) drug therapy: Secondary | ICD-10-CM | POA: Diagnosis not present

## 2018-10-27 DIAGNOSIS — Z510 Encounter for antineoplastic radiation therapy: Secondary | ICD-10-CM | POA: Diagnosis not present

## 2018-10-27 NOTE — Progress Notes (Signed)
[place]   [phone] [phone]   Clinic Follow up Note   Patient Care Team: Fanny Bien, MD as PCP - General (Family Medicine) Leonie Man, MD as PCP - Cardiology (Cardiology) Ronnette Juniper, MD as Consulting Physician (Gastroenterology) Arna Snipe, RN as Oncology Nurse Navigator Michael Boston, MD as Consulting Physician (General Surgery) Truitt Merle, MD as Consulting Physician (Hematology) Kyung Rudd, MD as Consulting Physician (Radiation Oncology) 10/27/2018  I connected with [name] on 10/27/18 at 3:20 PM EDT by telephone visit and verified that I am speaking with the correct person using two identifiers.   I discussed the limitations, risks, security and privacy concerns of performing an evaluation and management service by telemedicine and the availability of in-person appointments. I also discussed with the patient that there may be a patient responsible charge related to this service. The patient expressed understanding and agreed to proceed.   Other persons participating in the visit and their role in the encounter: daughter, [name]   Patients location: home Providers location: [place] office  CHIEF COMPLAINT: f/u rectal cancer    SUMMARY OF ONCOLOGIC HISTORY: Oncology History  Rectal cancer ([place])  06/01/2018 Imaging   COLONOSCOPY - Firm, nodular, thickened, raised area felt at the anal verge found on perianal exam. 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 Biopsied. - Two 6 to 8 mm polyps in the transverse colon, removed with a hot snare. Resected and retrieved. - One 7 mm polyp in the ascending colon, removed with a hot snare. Resected and retrieved. - One 6 mm polyp in the cecum, removed with a hot snare. Resected and retrieved. - One 4 mm polyp at the hepatic flexure, removed with a cold biopsy forceps. Resected and retrieved. - One 9 mm polyp in the descending colon, removed with a hot snare. Resected and retrieved. -  Diverticulosis in the sigmoid colon and in the descending colon.   06/01/2018 Initial Biopsy   Pathology from colonoscopy:  1. All colon polyps negative for malignancy. 2. Tubulovillous adenoma with at least high grade dysplasia at the rectal tumor. There were several foci highly suspicious but not definitive for invasive adenocarcinoma.    09/03/2018 Imaging   PELVIC MRI IMPRESSION: 1. 3.0 cm polypoid mass along the right anterolateral aspect of the anal verge, as described above. 2. Given the location, it is unclear whether this is more appropriately characterized as rectal cancer versus anal cancer. Regardless, given the clinical information, rectal cancer imaging characterization has been provided. 3. Rectal adenocarcinoma T stage: T3d 4. Rectal adenocarcinoma N stage:  N0 5. Distance from tumor to the internal anal sphincter is 0 cm.   09/19/2018 Miscellaneous   TUMOR BOARD: concurrent radiation therapy and chemotherapy with Xeloda   09/21/2018 Initial Diagnosis   Rectal cancer ([place])   09/21/2018 Cancer Staging   Staging form: Colon and Rectum, AJCC 8th Edition - Clinical: Stage IIA (cT3, cN0, cM0) - Signed by Truitt Merle, MD on 09/21/2018    Chemotherapy   ChemoRT with Xeloda beginning on 09/26/18, Xeloda 1500mg  q12h on days of radiation     Radiation Therapy   ChemoRT with Xeloda beginning on 09/26/18 by Dr Lisbeth Renshaw      CURRENT THERAPY: concurrent chemoRT with Xeloda 1500 mg BID M-F on days with radiation, began 09/26/18, reduced to 1000 mg on 7/27 PM and 7/28 AM; held xeloda on 10/25/18 due to fatigue, chills, and UTI   INTERVAL HISTORY: Ms. Reinheimer and her daughter Maudie Mercury present via  phone call. She has just woken up from a nap and is resting on the couch. She rests often. She feels "lousy," and irritable but slightly better than 7/28. She has taken 2 doses of bactrim. Still having intermittent chills without fever. She is holding Xeloda, continues RT. She gets 2 tabs imodium with morning  meds. Her daughter reports stools are still runny but patient confirms one yesterday that was formed. She does not always report her BMs. Denies n/v. Mouth is tender but no sores. Diet consists of soup and ensure. She uses silvadene to rectal area, daughter reports 3 blisters, 1 of which has opened.    MEDICAL HISTORY:  Past Medical History:  Diagnosis Date   Anxiety    Atherosclerosis of aortic arch [facility]) April 2017   Noted on CT chest, calcified aortic atherosclerosis at the arch.   Atrial fibrillation, permanent 03/2013   Previously on Atenolol for Rate - now on combination diltiazem and carvedilol for rate Control;  & Xarelto for AC. CHADS2VASC2 = 7.   Cerebral infarction [facility]) 10/2008   MRI Brain 05/2014 for near syncope revealed: No acure infarct.  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   Mild small vessel disease type changes.  No intracranial hemorrhage.  Global atrophy.    CHF (congestive heart failure) (HCC)    COPD (chronic obstructive pulmonary disease) ([place])    Coronary atherosclerosis due to calcified coronary lesion April 2017   Noted on CT chest   Dementia [facility])    following 2010 stroke   Hyperlipemia    Hypertension    Pneumonia    PONV (postoperative nausea and vomiting)    Stroke ([place])    2010- patient has dementia related to stroke    Tinea unguium    Tremor    Tuberculosis     SURGICAL HISTORY: Past Surgical History:  Procedure Laterality Date   BIOPSY  06/01/2018   Procedure: BIOPSY;  Surgeon: Ronnette Juniper, MD;  Location: [facility];  Service: Gastroenterology;;   BLADDER SURGERY     CARPAL TUNNEL RELEASE Left    COLONOSCOPY N/A 06/01/2018   Procedure: COLONOSCOPY;  Surgeon: Ronnette Juniper, MD;  Location: [facility];  Service: Gastroenterology;  Laterality: N/A;   ESOPHAGOGASTRODUODENOSCOPY (EGD) WITH PROPOFOL N/A 02/04/2018   Procedure:  ESOPHAGOGASTRODUODENOSCOPY (EGD) WITH PROPOFOL;  Surgeon: Ronnette Juniper, MD;  Location: [facility];  Service: Gastroenterology;  Laterality: N/A;   FOOT SURGERY Right    HERNIA REPAIR     NASAL SINUS SURGERY     POLYPECTOMY  06/01/2018   Procedure: POLYPECTOMY;  Surgeon: Ronnette Juniper, MD;  Location: [facility];  Service: Gastroenterology;;   TOTAL ABDOMINAL HYSTERECTOMY      I have reviewed the social history and family history with the patient and they are unchanged from previous note.  ALLERGIES:  is allergic to tetanus toxoids.  MEDICATIONS:  Current Outpatient Medications  Medication Sig Dispense Refill   albuterol (PROVENTIL) (2.5 MG/3ML) 0.083% nebulizer solution Take 3 mLs (2.5 mg total) by nebulization every 6 (six) hours as needed for wheezing or shortness of breath. 30 vial 1   Azelastine HCl 137 MCG/SPRAY SOLN Place 2 sprays into both nostrils 2 (two) times daily. Use in each nostril as directed      B Complex Vitamins (VITAMIN B COMPLEX PO) Take 1 tablet by mouth daily.      BISACODYL 5 MG EC tablet TK AS DIRECTED  calcitonin, salmon, (MIACALCIN) 200 UNIT/ACT nasal spray Place 1 spray into alternate nostrils daily.     capecitabine (XELODA) 500 MG tablet Take 3 tablets (1,500 mg total) by mouth 2 (two) times daily after a meal. Take on days of radiation, Monday through Friday 168 tablet 0   carvedilol (COREG) 12.5 MG tablet TAKE 1 TABLET TWICE A DAY WITH MEALS (DUE FOR FOLLOW UP APPOINTMENT IN [place]) (Patient not taking: Reported on 09/26/2018) 180 tablet 4   Cholecalciferol (VITAMIN D3 SUPER STRENGTH) 2000 units CAPS Take 2,000 Units by mouth daily.      diltiazem (CARDIZEM CD) 120 MG 24 hr capsule TAKE 3 CAPSULES DAILY (PLEASE SCHEDULE AN APPOINTMENT FOR FURTHER REFILLS) (Patient taking differently: Take 360 mg by mouth daily. ) 360 capsule 4   docusate sodium (COLACE) 100 MG capsule Take 100-200 mg by mouth 2 (two) times daily as needed for mild constipation. Pt  does take at least 100 mg daily     donepezil (ARICEPT) 10 MG tablet Take 1 tablet (10 mg total) by mouth at bedtime. 90 tablet 3   feeding supplement, ENSURE ENLIVE, (ENSURE ENLIVE) LIQD Take 237 mLs by mouth 3 (three) times daily between meals. (Patient taking differently: Take 237 mLs by mouth See admin instructions. 2-3  Times daily) 237 mL 12   ferrous sulfate 325 (65 FE) MG tablet Take 1 tablet (325 mg total) by mouth every other day. 30 tablet 0   FLUoxetine (PROZAC) 40 MG capsule Take 40 mg by mouth daily.      Fluticasone-Umeclidin-Vilant (TRELEGY ELLIPTA) 100-62.5-25 MCG/INH AEPB Inhale 1 puff into the lungs daily.     furosemide (LASIX) 20 MG tablet Take 1 tablet (20 mg total) by mouth daily as needed for fluid or edema. (Patient taking differently: Take 20 mg by mouth every other day. ) 30 tablet 6   loratadine (CLARITIN) 10 MG tablet Take 10 mg by mouth daily.     Melatonin 10 MG TABS Take 10 mg by mouth at bedtime.      memantine (NAMENDA) 10 MG tablet Take 1 tablet (10 mg total) by mouth 2 (two) times daily. 180 tablet 3   Multiple Vitamin (MULTIVITAMIN) capsule Take 1 capsule by mouth daily.     ondansetron (ZOFRAN) 8 MG tablet TAKE 0.5-1 TABLET (4-8 MG TOTAL) BY MOUTH EVERY 8 (EIGHT) HOURS AS NEEDED FOR NAUSEA OR VOMITING. 30 tablet 0   potassium chloride SA (K-DUR) 20 MEQ tablet Take 1 tablet (20 mEq total) by mouth daily. 30 tablet 0   rivaroxaban (XARELTO) 10 MG TABS tablet Take 1 tablet (10 mg total) by mouth daily. 30 tablet 1   simvastatin (ZOCOR) 40 MG tablet Take 40 mg by mouth daily.     sulfamethoxazole-trimethoprim (BACTRIM DS) 800-160 MG tablet Take 1 tablet by mouth 2 (two) times daily. 6 tablet 0   No current facility-administered medications for this visit.     PHYSICAL EXAMINATION: ECOG PERFORMANCE STATUS: 3 - Symptomatic, >50% confined to bed No vitals for this visit Patient is communicative over the phone. Speech is intact, non-pressured. Alert  and oriented. She is not coughing.  LABORATORY DATA:  None for this visit  RADIOGRAPHIC STUDIES: I have personally reviewed the radiological images as listed and agreed with the findings in the report. No results found.   ASSESSMENT & PLAN: 85 yo female   1. Fatigue, dyspnea, tachypnea, chills without fever on 10/25/18  -Xeloda on hold since 7/28 for above symptoms -she received 500 cc NS  on 7/28  -she was found to have UTI, started bactrim empirically; culture showed gram negative rods; sensitivities pending -blood culture and chest xray negative -she is improving slowly   2. Distal rectal adenocarcinoma, cT3N0M0, stage IIA -She began concurrent chemoRT with Xeloda1500 mg BID on M-F on 09/26/18. -toleratingwell until end of week 4 when she developed abrupt moderate to severe fatigue. I dose reduced Xeloda to 1000 mg 7/27 PM and 7/28 AM until she was seen in person for evaluation. -Xeloda on hold since 7/28; she continued radiation  -she remains moderately fatigued and not eating/drinking much. I strongly urged her to push po liquids over the weekend and try to increase activity to help fatigue.  -she will return for lab and f/u on 8/3. If she improves, may consider restarting Xeloda for her last week of radiation.   3. Hypokalemia -Taking 1 tab BID since 7/28   4. Afib -on Xarelto  5. CHF -controlled on cardizem and lasix (every other day for 1-2 years)  PLAN: -Continue bactrim, sensitivities pending  -Push po liquids over the weekend, increase activity  -Return for lab, f/u 8/3 -If improved, may consider restarting Xeloda for last week of radiation -Continue skin care  -Continue RT  All questions were answered. The patient knows to call the clinic to seek in-person evaluation with any problems, questions or concerns. No barriers to learning was detected. I spent 15 minutes non-face-to-face time in today's encounter.      Alla Feeling, NP 10/27/18

## 2018-10-27 NOTE — Telephone Encounter (Signed)
No los per 7/30. °

## 2018-10-28 ENCOUNTER — Ambulatory Visit
Admission: RE | Admit: 2018-10-28 | Discharge: 2018-10-28 | Disposition: A | Discharge status: Home / Self Care | Payer: Medicare Other | Source: Ambulatory Visit | Attending: Radiation Oncology | Admitting: Radiation Oncology

## 2018-10-28 DIAGNOSIS — C20 Malignant neoplasm of rectum: Secondary | ICD-10-CM | POA: Diagnosis not present

## 2018-10-28 DIAGNOSIS — Z510 Encounter for antineoplastic radiation therapy: Secondary | ICD-10-CM | POA: Diagnosis not present

## 2018-10-28 LAB — URINE CULTURE: Culture: >=100000 — AB

## 2018-10-31 ENCOUNTER — Encounter: Payer: Self-pay | Admitting: Hematology

## 2018-10-31 ENCOUNTER — Inpatient Hospital Stay: Payer: Medicare Other | Attending: Nurse Practitioner

## 2018-10-31 ENCOUNTER — Inpatient Hospital Stay (HOSPITAL_BASED_OUTPATIENT_CLINIC_OR_DEPARTMENT_OTHER): Payer: Medicare Other | Admitting: Hematology

## 2018-10-31 ENCOUNTER — Ambulatory Visit
Admission: RE | Admit: 2018-10-31 | Discharge: 2018-10-31 | Disposition: A | Discharge status: Home / Self Care | Payer: Medicare Other | Source: Ambulatory Visit | Attending: Radiation Oncology | Admitting: Radiation Oncology

## 2018-10-31 ENCOUNTER — Other Ambulatory Visit: Payer: Self-pay

## 2018-10-31 VITALS — BP 112/58 | HR 100 | Temp 98.4°F | Resp 17 | Ht 67.0 in | Wt 140.8 lb

## 2018-10-31 DIAGNOSIS — Z7901 Long term (current) use of anticoagulants: Secondary | ICD-10-CM | POA: Diagnosis not present

## 2018-10-31 DIAGNOSIS — I4821 Permanent atrial fibrillation: Secondary | ICD-10-CM

## 2018-10-31 DIAGNOSIS — F329 Major depressive disorder, single episode, unspecified: Secondary | ICD-10-CM | POA: Insufficient documentation

## 2018-10-31 DIAGNOSIS — F419 Anxiety disorder, unspecified: Secondary | ICD-10-CM | POA: Diagnosis not present

## 2018-10-31 DIAGNOSIS — F0390 Unspecified dementia without behavioral disturbance: Secondary | ICD-10-CM | POA: Diagnosis not present

## 2018-10-31 DIAGNOSIS — C20 Malignant neoplasm of rectum: Secondary | ICD-10-CM

## 2018-10-31 DIAGNOSIS — Z510 Encounter for antineoplastic radiation therapy: Secondary | ICD-10-CM | POA: Diagnosis not present

## 2018-10-31 DIAGNOSIS — I110 Hypertensive heart disease with heart failure: Secondary | ICD-10-CM | POA: Insufficient documentation

## 2018-10-31 DIAGNOSIS — I509 Heart failure, unspecified: Secondary | ICD-10-CM | POA: Insufficient documentation

## 2018-10-31 DIAGNOSIS — Z79899 Other long term (current) drug therapy: Secondary | ICD-10-CM | POA: Insufficient documentation

## 2018-10-31 DIAGNOSIS — I5032 Chronic diastolic (congestive) heart failure: Secondary | ICD-10-CM | POA: Diagnosis not present

## 2018-10-31 DIAGNOSIS — Z8673 Personal history of transient ischemic attack (TIA), and cerebral infarction without residual deficits: Secondary | ICD-10-CM | POA: Insufficient documentation

## 2018-10-31 LAB — CBC WITH DIFFERENTIAL (CANCER CENTER ONLY)
Abs Immature Granulocytes: 0.16 10*3/uL — ABNORMAL HIGH (ref 0.00–0.07)
Basophils Absolute: 0 10*3/uL (ref 0.0–0.1)
Basophils Relative: 0 %
Eosinophils Absolute: 0 10*3/uL (ref 0.0–0.5)
Eosinophils Relative: 0 %
HCT: 31.9 % — ABNORMAL LOW (ref 36.0–46.0)
Hemoglobin: 10.6 g/dL — ABNORMAL LOW (ref 12.0–15.0)
Immature Granulocytes: 5 %
Lymphocytes Relative: 9 %
Lymphs Abs: 0.3 10*3/uL — ABNORMAL LOW (ref 0.7–4.0)
MCH: 31.3 pg (ref 26.0–34.0)
MCHC: 33.2 g/dL (ref 30.0–36.0)
MCV: 94.1 fL (ref 80.0–100.0)
Monocytes Absolute: 0.4 10*3/uL (ref 0.1–1.0)
Monocytes Relative: 12 %
Neutro Abs: 2.2 10*3/uL (ref 1.7–7.7)
Neutrophils Relative %: 74 %
Platelet Count: 159 10*3/uL (ref 150–400)
RBC: 3.39 MIL/uL — ABNORMAL LOW (ref 3.87–5.11)
RDW: 18.9 % — ABNORMAL HIGH (ref 11.5–15.5)
WBC Count: 3 10*3/uL — ABNORMAL LOW (ref 4.0–10.5)
nRBC: 0 % (ref 0.0–0.2)

## 2018-10-31 LAB — CULTURE, BLOOD (SINGLE): Culture: NO GROWTH

## 2018-10-31 LAB — CMP (CANCER CENTER ONLY)
ALT: 37 U/L (ref 0–44)
AST: 45 U/L — ABNORMAL HIGH (ref 15–41)
Albumin: 2.9 g/dL — ABNORMAL LOW (ref 3.5–5.0)
Alkaline Phosphatase: 65 U/L (ref 38–126)
Anion gap: 7 (ref 5–15)
BUN: 12 mg/dL (ref 8–23)
CO2: 27 mmol/L (ref 22–32)
Calcium: 8.3 mg/dL — ABNORMAL LOW (ref 8.9–10.3)
Chloride: 104 mmol/L (ref 98–111)
Creatinine: 0.66 mg/dL (ref 0.44–1.00)
GFR, Est AFR Am: >60 mL/min (ref >60)
GFR, Estimated: >60 mL/min (ref >60)
Glucose, Bld: 87 mg/dL (ref 70–99)
Potassium: 3.7 mmol/L (ref 3.5–5.1)
Sodium: 138 mmol/L (ref 135–145)
Total Bilirubin: 0.5 mg/dL (ref 0.3–1.2)
Total Protein: 5.4 g/dL — ABNORMAL LOW (ref 6.5–8.1)

## 2018-10-31 NOTE — Progress Notes (Signed)
[place]   [phone] [phone]   Clinic Follow up Note   Patient Care Team: Fanny Bien, MD as PCP - General (Family Medicine) Leonie Man, MD as PCP - Cardiology (Cardiology) Ronnette Juniper, MD as Consulting Physician (Gastroenterology) Arna Snipe, RN as Oncology Nurse Navigator Michael Boston, MD as Consulting Physician (General Surgery) Truitt Merle, MD as Consulting Physician (Hematology) Kyung Rudd, MD as Consulting Physician (Radiation Oncology)  Date of Service:  10/31/2018  CHIEF COMPLAINT: F/u of rectal carcinoma   SUMMARY OF ONCOLOGIC HISTORY: Oncology History  Rectal cancer ([place])  06/01/2018 Imaging   COLONOSCOPY - Firm, nodular, thickened, raised area felt at the anal verge found on perianal exam. 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 Biopsied. - Two 6 to 8 mm polyps in the transverse colon, removed with a hot snare. Resected and retrieved. - One 7 mm polyp in the ascending colon, removed with a hot snare. Resected and retrieved. - One 6 mm polyp in the cecum, removed with a hot snare. Resected and retrieved. - One 4 mm polyp at the hepatic flexure, removed with a cold biopsy forceps. Resected and retrieved. - One 9 mm polyp in the descending colon, removed with a hot snare. Resected and retrieved. - Diverticulosis in the sigmoid colon and in the descending colon.   06/01/2018 Initial Biopsy   Pathology from colonoscopy:  1. All colon polyps negative for malignancy. 2. Tubulovillous adenoma with at least high grade dysplasia at the rectal tumor. There were several foci highly suspicious but not definitive for invasive adenocarcinoma.    09/03/2018 Imaging   PELVIC MRI IMPRESSION: 1. 3.0 cm polypoid mass along the right anterolateral aspect of the anal verge, as described above. 2. Given the location, it is unclear whether this is more appropriately characterized as rectal cancer versus anal cancer. Regardless, given the  clinical information, rectal cancer imaging characterization has been provided. 3. Rectal adenocarcinoma T stage: T3d 4. Rectal adenocarcinoma N stage:  N0 5. Distance from tumor to the internal anal sphincter is 0 cm.   09/19/2018 Miscellaneous   TUMOR BOARD: concurrent radiation therapy and chemotherapy with Xeloda   09/21/2018 Initial Diagnosis   Rectal cancer ([place])   09/21/2018 Cancer Staging   Staging form: Colon and Rectum, AJCC 8th Edition - Clinical: Stage IIA (cT3, cN0, cM0) - Signed by Truitt Merle, MD on 09/21/2018   09/26/2018 -  Radiation Therapy   ChemoRT with Xeloda beginning on 09/26/18 by Dr Lisbeth Renshaw ending on 11/03/18.    09/26/2018 -  Chemotherapy   ChemoRT with Xeloda beginning on 09/26/18, Xeloda 1500mg  q12h on days of radiation. Dose reduced to 1000 mg on 7/27 PM and 7/28 AM; held xeloda on 10/25/18 due to fatigue, chills, and UTI        CURRENT THERAPY:  concurrent chemoRT with Xeloda 1500 mg BID M-F on days with radiation, began 09/26/18, reduced to 1000 mg on 7/27 PM and 7/28 AM; held Xeloda on 10/25/18 due to fatigue, chills, and UTI. Restarted on 10/31/18 at 1000mg  BID. Plan to complete on 8/10.   INTERVAL HISTORY:  Megan Munoz is here for a follow up. She presents to the clinic alone. I called her daughter to be included in the visit today. She notes she is doing better. She denies any more chills. She notes she still feels weak. She notes her appetite and eating is very poor. She had 3 bottles of ensure yesterday. She can drink 40-45 ounces of water.  She notes she no longer has loose stool since yesterday, which was once. She notes every time she uses bathroom she has bleeding in her crotch. She has rectal pain 2/10.     REVIEW OF SYSTEMS:   Constitutional: Denies fevers, chills (+) Low appetite and eating, stable weight  Eyes: Denies blurriness of vision Ears, nose, mouth, throat, and face: Denies mucositis or sore throat Respiratory: Denies cough, dyspnea or wheezes  Cardiovascular: Denies palpitation, chest discomfort or lower extremity swelling Gastrointestinal:  Denies nausea, heartburn (+) loose stool improving (+) rectal pain 2/10.  Skin: Denies abnormal skin rashes Lymphatics: Denies new lymphadenopathy or easy bruising Neurological:Denies numbness, tingling or new weaknesses Behavioral/Psych: Mood is stable, no new changes  All other systems were reviewed with the patient and are negative.  MEDICAL HISTORY:  Past Medical History:  Diagnosis Date  . Anxiety   . Atherosclerosis of aortic arch [facility]) April 2017   Noted on CT chest, calcified aortic atherosclerosis at the arch.  . Atrial fibrillation, permanent 03/2013   Previously on Atenolol for Rate - now on combination diltiazem and carvedilol for rate Control;  & Xarelto for AC. CHADS2VASC2 = 7.  . Cerebral infarction [facility]) 10/2008   MRI Brain 05/2014 for near syncope revealed: No acure infarct.  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   Mild small vessel disease type changes.  No intracranial hemorrhage.  Global atrophy.   . CHF (congestive heart failure) ([place])   . COPD (chronic obstructive pulmonary disease) ([place])   . Coronary atherosclerosis due to calcified coronary lesion April 2017   Noted on CT chest  . Dementia ([place])    following 2010 stroke  . Hyperlipemia   . Hypertension   . Pneumonia   . PONV (postoperative nausea and vomiting)   . Stroke [facility])    2010- patient has dementia related to stroke   . Tinea unguium   . Tremor   . Tuberculosis     SURGICAL HISTORY: Past Surgical History:  Procedure Laterality Date  . BIOPSY  06/01/2018   Procedure: BIOPSY;  Surgeon: Ronnette Juniper, MD;  Location: [facility] ENDOSCOPY;  Service: Gastroenterology;;  . BLADDER SURGERY    . CARPAL TUNNEL RELEASE Left   . COLONOSCOPY N/A 06/01/2018   Procedure: COLONOSCOPY;  Surgeon: Ronnette Juniper, MD;  Location: [facility]  ENDOSCOPY;  Service: Gastroenterology;  Laterality: N/A;  . ESOPHAGOGASTRODUODENOSCOPY (EGD) WITH PROPOFOL N/A 02/04/2018   Procedure: ESOPHAGOGASTRODUODENOSCOPY (EGD) WITH PROPOFOL;  Surgeon: Ronnette Juniper, MD;  Location: [facility];  Service: Gastroenterology;  Laterality: N/A;  . FOOT SURGERY Right   . HERNIA REPAIR    . NASAL SINUS SURGERY    . POLYPECTOMY  06/01/2018   Procedure: POLYPECTOMY;  Surgeon: Ronnette Juniper, MD;  Location: [facility] ENDOSCOPY;  Service: Gastroenterology;;  . TOTAL ABDOMINAL HYSTERECTOMY      I have reviewed the social history and family history with the patient and they are unchanged from previous note.  ALLERGIES:  is allergic to tetanus toxoids.  MEDICATIONS:  Current Outpatient Medications  Medication Sig Dispense Refill  . albuterol (PROVENTIL) (2.5 MG/3ML) 0.083% nebulizer solution Take 3 mLs (2.5 mg total) by nebulization every 6 (six) hours as needed for wheezing or shortness of breath. 30 vial 1  . Azelastine HCl 137 MCG/SPRAY SOLN Place 2 sprays into both nostrils 2 (two) times daily. Use in each nostril as directed     . B Complex  Vitamins (VITAMIN B COMPLEX PO) Take 1 tablet by mouth daily.     Marland Kitchen BISACODYL 5 MG EC tablet TK AS DIRECTED    . calcitonin, salmon, (MIACALCIN) 200 UNIT/ACT nasal spray Place 1 spray into alternate nostrils daily.    . capecitabine (XELODA) 500 MG tablet Take 3 tablets (1,500 mg total) by mouth 2 (two) times daily after a meal. Take on days of radiation, Monday through Friday 168 tablet 0  . carvedilol (COREG) 12.5 MG tablet TAKE 1 TABLET TWICE A DAY WITH MEALS (DUE FOR FOLLOW UP APPOINTMENT IN JUNE) 180 tablet 4  . Cholecalciferol (VITAMIN D3 SUPER STRENGTH) 2000 units CAPS Take 2,000 Units by mouth daily.     Marland Kitchen diltiazem (CARDIZEM CD) 120 MG 24 hr capsule TAKE 3 CAPSULES DAILY (PLEASE SCHEDULE AN APPOINTMENT FOR FURTHER REFILLS) (Patient taking differently: Take 360 mg by mouth daily. ) 360 capsule 4  . docusate sodium (COLACE) 100  MG capsule Take 100-200 mg by mouth 2 (two) times daily as needed for mild constipation. Pt does take at least 100 mg daily    . donepezil (ARICEPT) 10 MG tablet Take 1 tablet (10 mg total) by mouth at bedtime. 90 tablet 3  . feeding supplement, ENSURE ENLIVE, (ENSURE ENLIVE) LIQD Take 237 mLs by mouth 3 (three) times daily between meals. (Patient taking differently: Take 237 mLs by mouth See admin instructions. 2-3  Times daily) 237 mL 12  . ferrous sulfate 325 (65 FE) MG tablet Take 1 tablet (325 mg total) by mouth every other day. 30 tablet 0  . FLUoxetine (PROZAC) 40 MG capsule Take 40 mg by mouth daily.     . Fluticasone-Umeclidin-Vilant (TRELEGY ELLIPTA) 100-62.5-25 MCG/INH AEPB Inhale 1 puff into the lungs daily.    . furosemide (LASIX) 20 MG tablet Take 1 tablet (20 mg total) by mouth daily as needed for fluid or edema. (Patient taking differently: Take 20 mg by mouth every other day. ) 30 tablet 6  . loratadine (CLARITIN) 10 MG tablet Take 10 mg by mouth daily.    . Melatonin 10 MG TABS Take 10 mg by mouth at bedtime.     . memantine (NAMENDA) 10 MG tablet Take 1 tablet (10 mg total) by mouth 2 (two) times daily. 180 tablet 3  . Multiple Vitamin (MULTIVITAMIN) capsule Take 1 capsule by mouth daily.    . ondansetron (ZOFRAN) 8 MG tablet TAKE 0.5-1 TABLET (4-8 MG TOTAL) BY MOUTH EVERY 8 (EIGHT) HOURS AS NEEDED FOR NAUSEA OR VOMITING. 30 tablet 0  . potassium chloride SA (K-DUR) 20 MEQ tablet Take 1 tablet (20 mEq total) by mouth daily. 30 tablet 0  . rivaroxaban (XARELTO) 10 MG TABS tablet Take 1 tablet (10 mg total) by mouth daily. 30 tablet 1  . simvastatin (ZOCOR) 40 MG tablet Take 40 mg by mouth daily.    Marland Kitchen sulfamethoxazole-trimethoprim (BACTRIM DS) 800-160 MG tablet Take 1 tablet by mouth 2 (two) times daily. 6 tablet 0   No current facility-administered medications for this visit.     PHYSICAL EXAMINATION: ECOG PERFORMANCE STATUS: 2 - Symptomatic, <50% confined to bed  Vitals:    10/31/18 1205  BP: (!) 112/58  Pulse: 100  Resp: 17  Temp: 98.4 F (36.9 C)  SpO2: 100%   Filed Weights   10/31/18 1205  Weight: 140 lb 12.8 oz (63.9 kg)    GENERAL:alert, no distress and comfortable SKIN: skin color, texture, turgor are normal, no rashes or significant lesions EYES: normal, Conjunctiva  are pink and non-injected, sclera clear  NECK: supple, thyroid normal size, non-tender, without nodularity LYMPH:  no palpable lymphadenopathy in the cervical, axillary  LUNGS: clear to auscultation and percussion with normal breathing effort HEART: regular rate & rhythm and no murmurs and no lower extremity edema ABDOMEN:abdomen soft, non-tender and normal bowel sounds Musculoskeletal:no cyanosis of digits and no clubbing  NEURO: alert & oriented x 3 with fluent speech, no focal motor/sensory deficits  LABORATORY DATA:  I have reviewed the data as listed CBC Latest Ref Rng & Units 10/31/2018 10/25/2018 10/24/2018  WBC 4.0 - 10.5 K/uL 3.0(L) 2.4(L) 2.4(L)  Hemoglobin 12.0 - 15.0 g/dL 10.6(L) 11.3(L) 10.8(L)  Hematocrit 36.0 - 46.0 % 31.9(L) 34.0(L) 33.1(L)  Platelets 150 - 400 K/uL 159 152 148(L)     CMP Latest Ref Rng & Units 10/31/2018 10/25/2018 10/24/2018  Glucose 70 - 99 mg/dL 87 103(H) 120(H)  BUN 8 - 23 mg/dL 12 18 17   Creatinine 0.44 - 1.00 mg/dL 0.66 0.70 0.72  Sodium 135 - 145 mmol/L 138 138 138  Potassium 3.5 - 5.1 mmol/L 3.7 3.1(L) 3.4(L)  Chloride 98 - 111 mmol/L 104 104 104  CO2 22 - 32 mmol/L 27 25 26   Calcium 8.9 - 10.3 mg/dL 8.3(L) 8.5(L) 8.5(L)  Total Protein 6.5 - 8.1 g/dL 5.4(L) 6.1(L) 6.0(L)  Total Bilirubin 0.3 - 1.2 mg/dL 0.5 1.0 0.9  Alkaline Phos 38 - 126 U/L 65 67 67  AST 15 - 41 U/L 45(H) 25 22  ALT 0 - 44 U/L 37 14 13      RADIOGRAPHIC STUDIES: I have personally reviewed the radiological images as listed and agreed with the findings in the report. No results found.   ASSESSMENT & PLAN:  Livianna Petraglia is a 85 y.o. female with   1. Distal renal  adenocarcinoma, cT3N0M0 stage IIA -She was diagnosed in 05/2018. Distal rectal mass is large, biopsy showed tubulovillous adenoma with at least high-grade dysplasia, and they are highly suspicious but not definitive invasive adenocarcinoma. Her pelvic MRI showed a distal rectal mass, clinical T3 lesion, no abnormal nodes.  -Based on the above, and her advanced age, I do not think she needs further biopsy, this is most consistent rectal cancer. Her CT chest was negative for metastasis.  -I started her on standard neoadjuvant ChemoRT with Xeloda 1500mg  q12h on days of radiation on 09/26/18, followed by surgery. Plan to complete on 11/07/18. However due to her advanced age, and dementia, she is not a good candidate for APR. She has seen Dr. Johney Maine.  -Given diarrhea she is now on prophylactic imodium twice in the morning. I discussed holding evening dose to avoid constipation. Has improved. Rectal pain now 2/10. -Held Xeloda since 10/25/18 due to UTI, chills, diarrhea and fatigue. She is doing better now, chills resolved. She was treated with antibiotics.  -She is still weaker with low appetite, weight stable. Labs reviewed, CBC and CMP WNL except WBC 3, Hg 10.6, Lymphocyte ct 0.3, AST 45. Will restart Xeloda 1000mg  BID starting tonight ending on 8/10.  -F/u in 1 week    2. HTN and Congestive heart failure  -On Cardizem, Lasix 20mg  daily   3. History of stroke and dementia -On Xarelto 10mg  daily -On Aricept 10mg  once daily and Namenda 10mg  twice daily -pt is able to do all ADLs, lives with her daughter and son-in-law, has good support  -Followed in neurology by Dr. Krista Blue   4. Depression and anxiety -On Prozac 40mg  daily   5.  Fatigue, dyspnea, tachypnea, chills without fever on 10/25/18  -Xeloda on hold since 7/28 for above symptoms -she received 500 cc NS on 7/28  -she was found to have UTI with labs last week. Blood culture and chest xray negative  -She completed antibiotics with bactrim.  -She  is improving slowly, still weak with very low appetite and eating, not much solid food. On Ensure TID. I recommend she add another ensure daily. Her weight is maintained.  -Fine to restart tonight at dose reduction.   PLAN:  -Labs reviewed and adequate to restart Xeloda 1000mg  BID tonight.  -Continue RT  -Labs, f/u in 1 week   No problem-specific Assessment & Plan notes found for this encounter.   No orders of the defined types were placed in this encounter.  All questions were answered. The patient knows to call the clinic with any problems, questions or concerns. No barriers to learning was detected. I spent 20 minutes counseling the patient face to face. The total time spent in the appointment was 25 minutes and more than 50% was on counseling and review of test results     Truitt Merle, MD 10/31/2018   I, Joslyn Devon, am acting as scribe for Truitt Merle, MD.   I have reviewed the above documentation for accuracy and completeness, and I agree with the above.

## 2018-11-01 ENCOUNTER — Ambulatory Visit
Admission: RE | Admit: 2018-11-01 | Discharge: 2018-11-01 | Disposition: A | Discharge status: Home / Self Care | Payer: Medicare Other | Source: Ambulatory Visit | Attending: Radiation Oncology | Admitting: Radiation Oncology

## 2018-11-01 ENCOUNTER — Telehealth: Payer: Self-pay | Admitting: Hematology

## 2018-11-01 DIAGNOSIS — Z510 Encounter for antineoplastic radiation therapy: Secondary | ICD-10-CM | POA: Diagnosis not present

## 2018-11-01 DIAGNOSIS — I4891 Unspecified atrial fibrillation: Secondary | ICD-10-CM | POA: Diagnosis not present

## 2018-11-01 DIAGNOSIS — I509 Heart failure, unspecified: Secondary | ICD-10-CM | POA: Diagnosis not present

## 2018-11-01 DIAGNOSIS — F419 Anxiety disorder, unspecified: Secondary | ICD-10-CM | POA: Diagnosis not present

## 2018-11-01 DIAGNOSIS — I10 Essential (primary) hypertension: Secondary | ICD-10-CM | POA: Diagnosis not present

## 2018-11-01 DIAGNOSIS — C20 Malignant neoplasm of rectum: Secondary | ICD-10-CM | POA: Diagnosis not present

## 2018-11-01 NOTE — Telephone Encounter (Signed)
Scheduled appt per 8/3 los.  Spoke with patient daughter and she is aware of the appt date and time.

## 2018-11-02 ENCOUNTER — Ambulatory Visit
Admission: RE | Admit: 2018-11-02 | Discharge: 2018-11-02 | Disposition: A | Discharge status: Home / Self Care | Payer: Medicare Other | Source: Ambulatory Visit | Attending: Radiation Oncology | Admitting: Radiation Oncology

## 2018-11-02 DIAGNOSIS — C20 Malignant neoplasm of rectum: Secondary | ICD-10-CM | POA: Diagnosis not present

## 2018-11-02 DIAGNOSIS — Z510 Encounter for antineoplastic radiation therapy: Secondary | ICD-10-CM | POA: Diagnosis not present

## 2018-11-03 ENCOUNTER — Ambulatory Visit
Admission: RE | Admit: 2018-11-03 | Discharge: 2018-11-03 | Disposition: A | Discharge status: Home / Self Care | Payer: Medicare Other | Source: Ambulatory Visit | Attending: Radiation Oncology | Admitting: Radiation Oncology

## 2018-11-03 ENCOUNTER — Ambulatory Visit: Payer: Medicare Other

## 2018-11-03 DIAGNOSIS — C20 Malignant neoplasm of rectum: Secondary | ICD-10-CM | POA: Diagnosis not present

## 2018-11-03 DIAGNOSIS — Z510 Encounter for antineoplastic radiation therapy: Secondary | ICD-10-CM | POA: Diagnosis not present

## 2018-11-03 NOTE — Progress Notes (Signed)
[place]   [phone] [phone]   Clinic Follow up Note   Patient Care Team: Fanny Bien, MD as PCP - General (Family Medicine) Leonie Man, MD as PCP - Cardiology (Cardiology) Ronnette Juniper, MD as Consulting Physician (Gastroenterology) Arna Snipe, RN as Oncology Nurse Navigator Michael Boston, MD as Consulting Physician (General Surgery) Truitt Merle, MD as Consulting Physician (Hematology) Kyung Rudd, MD as Consulting Physician (Radiation Oncology)  Date of Service:  11/07/2018  CHIEF COMPLAINT:  F/u of rectal carcinoma   SUMMARY OF ONCOLOGIC HISTORY: Oncology History Overview Note  Cancer Staging Rectal cancer [facility]) Staging form: Colon and Rectum, AJCC 8th Edition - Clinical: Stage IIA (cT3, cN0, cM0) - Signed by Truitt Merle, MD on 09/21/2018    Rectal cancer ([place])  06/01/2018 Imaging   COLONOSCOPY - Firm, nodular, thickened, raised area felt at the anal verge found on perianal exam. 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 Biopsied. - Two 6 to 8 mm polyps in the transverse colon, removed with a hot snare. Resected and retrieved. - One 7 mm polyp in the ascending colon, removed with a hot snare. Resected and retrieved. - One 6 mm polyp in the cecum, removed with a hot snare. Resected and retrieved. - One 4 mm polyp at the hepatic flexure, removed with a cold biopsy forceps. Resected and retrieved. - One 9 mm polyp in the descending colon, removed with a hot snare. Resected and retrieved. - Diverticulosis in the sigmoid colon and in the descending colon.   06/01/2018 Initial Biopsy   Pathology from colonoscopy:  1. All colon polyps negative for malignancy. 2. Tubulovillous adenoma with at least high grade dysplasia at the rectal tumor. There were several foci highly suspicious but not definitive for invasive adenocarcinoma.    09/03/2018 Imaging   PELVIC MRI IMPRESSION: 1. 3.0 cm polypoid mass along the right anterolateral aspect  of the anal verge, as described above. 2. Given the location, it is unclear whether this is more appropriately characterized as rectal cancer versus anal cancer. Regardless, given the clinical information, rectal cancer imaging characterization has been provided. 3. Rectal adenocarcinoma T stage: T3d 4. Rectal adenocarcinoma N stage:  N0 5. Distance from tumor to the internal anal sphincter is 0 cm.   09/19/2018 Miscellaneous   TUMOR BOARD: concurrent radiation therapy and chemotherapy with Xeloda   09/21/2018 Initial Diagnosis   Rectal cancer ([place])   09/21/2018 Cancer Staging   Staging form: Colon and Rectum, AJCC 8th Edition - Clinical: Stage IIA (cT3, cN0, cM0) - Signed by Truitt Merle, MD on 09/21/2018   09/26/2018 - 11/07/2018 Radiation Therapy   ChemoRT with Xeloda beginning on 09/26/18 by Dr Lisbeth Renshaw ending on 11/07/18.    09/26/2018 - 11/07/2018 Chemotherapy   ChemoRT with Xeloda beginning on 09/26/18, Xeloda 1500mg  q12h on days of radiation. Dose reduced to 1000 mg on 7/27 PM and 7/28 AM; held Xeloda on 10/25/18 due to fatigue, chills, and UTI. Restarted on 10/31/18 at 1000mg  BID. Completed on 8/10.        CURRENT THERAPY:  concurrent chemoRT with Xeloda 1500 mg BID M-F on days with radiation, began 09/26/18, reduced to 1000 mg on 7/27 PM and 7/28 AM; held Xeloda on 10/25/18 due to fatigue, chills, and UTI. Restarted on 10/31/18 at 1000mg  BID. Plan to complete on 8/10.   INTERVAL HISTORY:  [name] is here for a follow up and complete treatment. She presents to the clinic alone. I called her daughter to  be included in the visit today. She notes she is doing better lately. She did better last week. She had no stool incontinence. She was able to eat solid food with improved appetite. She has mild rectal mild with BM, 2/10. She notes occasional blood in her stool or wipe. She does all her personal care herself still.      REVIEW OF SYSTEMS:   Constitutional: Denies fevers, chills or abnormal  weight loss Eyes: Denies blurriness of vision Ears, nose, mouth, throat, and face: Denies mucositis or sore throat Respiratory: Denies cough, dyspnea or wheezes Cardiovascular: Denies palpitation, chest discomfort or lower extremity swelling Gastrointestinal:  Denies nausea, heartburn (+) mild rectal pain, 2/10 (+) occasional blood in her stool or when wiping.  Skin: Denies abnormal skin rashes Lymphatics: Denies new lymphadenopathy or easy bruising Neurological:Denies numbness, tingling or new weaknesses Behavioral/Psych: Mood is stable, no new changes  All other systems were reviewed with the patient and are negative.  MEDICAL HISTORY:  Past Medical History:  Diagnosis Date  . Anxiety   . Atherosclerosis of aortic arch [facility]) April 2017   Noted on CT chest, calcified aortic atherosclerosis at the arch.  . Atrial fibrillation, permanent 03/2013   Previously on Atenolol for Rate - now on combination diltiazem and carvedilol for rate Control;  & Xarelto for AC. CHADS2VASC2 = 7.  . Cerebral infarction [facility]) 10/2008   MRI Brain 05/2014 for near syncope revealed: No acure infarct.  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   Mild small vessel disease type changes.  No intracranial hemorrhage.  Global atrophy.   . CHF (congestive heart failure) ([place])   . COPD (chronic obstructive pulmonary disease) ([place])   . Coronary atherosclerosis due to calcified coronary lesion April 2017   Noted on CT chest  . Dementia ([place])    following 2010 stroke  . Hyperlipemia   . Hypertension   . Pneumonia   . PONV (postoperative nausea and vomiting)   . Stroke [facility])    2010- patient has dementia related to stroke   . Tinea unguium   . Tremor   . Tuberculosis     SURGICAL HISTORY: Past Surgical History:  Procedure Laterality Date  . BIOPSY  06/01/2018   Procedure: BIOPSY;  Surgeon: Ronnette Juniper, MD;  Location: [facility]  ENDOSCOPY;  Service: Gastroenterology;;  . BLADDER SURGERY    . CARPAL TUNNEL RELEASE Left   . COLONOSCOPY N/A 06/01/2018   Procedure: COLONOSCOPY;  Surgeon: Ronnette Juniper, MD;  Location: [facility] ENDOSCOPY;  Service: Gastroenterology;  Laterality: N/A;  . ESOPHAGOGASTRODUODENOSCOPY (EGD) WITH PROPOFOL N/A 02/04/2018   Procedure: ESOPHAGOGASTRODUODENOSCOPY (EGD) WITH PROPOFOL;  Surgeon: Ronnette Juniper, MD;  Location: [place];  Service: Gastroenterology;  Laterality: N/A;  . FOOT SURGERY Right   . HERNIA REPAIR    . NASAL SINUS SURGERY    . POLYPECTOMY  06/01/2018   Procedure: POLYPECTOMY;  Surgeon: Ronnette Juniper, MD;  Location: [facility] ENDOSCOPY;  Service: Gastroenterology;;  . TOTAL ABDOMINAL HYSTERECTOMY      I have reviewed the social history and family history with the patient and they are unchanged from previous note.  ALLERGIES:  is allergic to tetanus toxoids.  MEDICATIONS:  Current Outpatient Medications  Medication Sig Dispense Refill  . albuterol (PROVENTIL) (2.5 MG/3ML) 0.083% nebulizer solution Take 3 mLs (2.5 mg total) by nebulization every 6 (six) hours as needed for wheezing or shortness of breath. 30 vial 1  .  Azelastine HCl 137 MCG/SPRAY SOLN Place 2 sprays into both nostrils 2 (two) times daily. Use in each nostril as directed     . B Complex Vitamins (VITAMIN B COMPLEX PO) Take 1 tablet by mouth daily.     Marland Kitchen BISACODYL 5 MG EC tablet TK AS DIRECTED    . calcitonin, salmon, (MIACALCIN) 200 UNIT/ACT nasal spray Place 1 spray into alternate nostrils daily.    . capecitabine (XELODA) 500 MG tablet Take 3 tablets (1,500 mg total) by mouth 2 (two) times daily after a meal. Take on days of radiation, Monday through Friday 168 tablet 0  . Cholecalciferol (VITAMIN D3 SUPER STRENGTH) 2000 units CAPS Take 2,000 Units by mouth daily.     Marland Kitchen diltiazem (CARDIZEM CD) 120 MG 24 hr capsule TAKE 3 CAPSULES DAILY (PLEASE SCHEDULE AN APPOINTMENT FOR FURTHER REFILLS) (Patient taking differently: Take 360 mg  by mouth daily. ) 360 capsule 4  . docusate sodium (COLACE) 100 MG capsule Take 100-200 mg by mouth 2 (two) times daily as needed for mild constipation. Pt does take at least 100 mg daily    . donepezil (ARICEPT) 10 MG tablet Take 1 tablet (10 mg total) by mouth at bedtime. 90 tablet 3  . feeding supplement, ENSURE ENLIVE, (ENSURE ENLIVE) LIQD Take 237 mLs by mouth 3 (three) times daily between meals. (Patient taking differently: Take 237 mLs by mouth See admin instructions. 2-3  Times daily) 237 mL 12  . ferrous sulfate 325 (65 FE) MG tablet Take 1 tablet (325 mg total) by mouth every other day. 30 tablet 0  . FLUoxetine (PROZAC) 40 MG capsule Take 40 mg by mouth daily.     . Fluticasone-Umeclidin-Vilant (TRELEGY ELLIPTA) 100-62.5-25 MCG/INH AEPB Inhale 1 puff into the lungs daily.    . furosemide (LASIX) 20 MG tablet Take 1 tablet (20 mg total) by mouth daily as needed for fluid or edema. (Patient taking differently: Take 20 mg by mouth every other day. ) 30 tablet 6  . loratadine (CLARITIN) 10 MG tablet Take 10 mg by mouth daily.    . Melatonin 10 MG TABS Take 10 mg by mouth at bedtime.     . memantine (NAMENDA) 10 MG tablet Take 1 tablet (10 mg total) by mouth 2 (two) times daily. 180 tablet 3  . Multiple Vitamin (MULTIVITAMIN) capsule Take 1 capsule by mouth daily.    . ondansetron (ZOFRAN) 8 MG tablet TAKE 0.5-1 TABLET (4-8 MG TOTAL) BY MOUTH EVERY 8 (EIGHT) HOURS AS NEEDED FOR NAUSEA OR VOMITING. 30 tablet 0  . potassium chloride SA (K-DUR) 20 MEQ tablet Take 1 tablet (20 mEq total) by mouth daily. 30 tablet 0  . rivaroxaban (XARELTO) 10 MG TABS tablet Take 1 tablet (10 mg total) by mouth daily. 30 tablet 1  . simvastatin (ZOCOR) 40 MG tablet Take 40 mg by mouth daily.    Marland Kitchen sulfamethoxazole-trimethoprim (BACTRIM DS) 800-160 MG tablet Take 1 tablet by mouth 2 (two) times daily. 6 tablet 0   No current facility-administered medications for this visit.     PHYSICAL EXAMINATION: ECOG  PERFORMANCE STATUS: 2 - Symptomatic, <50% confined to bed  Vitals:   11/07/18 1003  BP: (!) 114/53  Pulse: 92  Resp: 18  Temp: 99.1 F (37.3 C)  SpO2: 97%   Filed Weights   11/07/18 1003  Weight: 140 lb 9.6 oz (63.8 kg)    GENERAL:alert, no distress and comfortable SKIN: skin color, texture, turgor are normal, no rashes or significant lesions (+)  Skin breakdown with skin erythema of medial right and inner buttocks spanning 2-3 cm as pictured below EYES: normal, Conjunctiva are pink and non-injected, sclera clear  NECK: supple, thyroid normal size, non-tender, without nodularity LYMPH:  no palpable lymphadenopathy in the cervical, axillary  LUNGS: clear to auscultation and percussion with normal breathing effort HEART: regular rate & rhythm and no murmurs and no lower extremity edema ABDOMEN:abdomen soft, non-tender and normal bowel sounds Musculoskeletal:no cyanosis of digits and no clubbing  NEURO: alert & oriented x 3 with fluent speech, no focal motor/sensory deficits     LABORATORY DATA:  I have reviewed the data as listed CBC Latest Ref Rng & Units 11/07/2018 10/31/2018 10/25/2018  WBC 4.0 - 10.5 K/uL 5.4 3.0(L) 2.4(L)  Hemoglobin 12.0 - 15.0 g/dL 11.0(L) 10.6(L) 11.3(L)  Hematocrit 36.0 - 46.0 % 33.9(L) 31.9(L) 34.0(L)  Platelets 150 - 400 K/uL 202 159 152     CMP Latest Ref Rng & Units 11/07/2018 10/31/2018 10/25/2018  Glucose 70 - 99 mg/dL 100(H) 87 103(H)  BUN 8 - 23 mg/dL 13 12 18   Creatinine 0.44 - 1.00 mg/dL 0.71 0.66 0.70  Sodium 135 - 145 mmol/L 137 138 138  Potassium 3.5 - 5.1 mmol/L 3.7 3.7 3.1(L)  Chloride 98 - 111 mmol/L 102 104 104  CO2 22 - 32 mmol/L 25 27 25   Calcium 8.9 - 10.3 mg/dL 8.8(L) 8.3(L) 8.5(L)  Total Protein 6.5 - 8.1 g/dL 5.8(L) 5.4(L) 6.1(L)  Total Bilirubin 0.3 - 1.2 mg/dL 0.7 0.5 1.0  Alkaline Phos 38 - 126 U/L 74 65 67  AST 15 - 41 U/L 34 45(H) 25  ALT 0 - 44 U/L 26 37 14      RADIOGRAPHIC STUDIES: I have personally reviewed the  radiological images as listed and agreed with the findings in the report. No results found.   ASSESSMENT & PLAN:  Megan Munoz is a 85 y.o. female with   1.Distalrenaladenocarcinoma, cT3N0M0 stage IIA -She was diagnosed in 05/2018.Distal rectal mass is large, biopsy showed tubulovillous adenoma with at least high-grade dysplasia, and they arehighly suspicious but not definitive invasive adenocarcinoma.Her pelvic MRI showed a distal rectal mass,clinical T3 lesion, no abnormal nodes.  -Based on the above, and her advanced age, I do not think she needs further biopsy, this is most consistent rectal cancer. Her CT chest was negative for metastasis.  -I started her on standard neoadjuvant ChemoRT with Xeloda 1500mg  q12h on days of radiation on 09/26/18, followed by surgery. Plan to complete on 11/07/18. However due to her advanced age, and dementia, she is not a good candidate for APR. She has seen Dr. Johney Maine.  -She has been tolerating treatment moderately well. Given diarrhea she is now on prophylactic imodium twice in the morning. I discussed holding evening dose to avoid constipation. Has improved. Rectal pain now 2/10.  -Held Xeloda 10/25/18-10/31/18 due to UTI, chills, diarrhea and fatigue. She is doing better now, chills resolved. She was treated with antibiotics. Xeloda dose was reduced to 1000mg  BID with restart.  -Her performance status did improve with dose reduction. Her rectal pain remains mild and stable with occasional blood in stool or on tissue.  -Exam today shows she does have skin breakdown of medial right and inner buttocks spanning 2-3 cm from radiation as pictured above. I encouraged her to keep area clean.  -Labs reviewed, CBC and CMP WNL except Hg 11, lymphocytes 0.4, calcium 8.8. Overall adequate to proceed with last dose Xeloda tonight. She will keep the pills  she did not use.  -I do not plan to give more chemo at this point. She is going to f/u with surgeon Dr. Johney Maine in a month.   -F/u in 4 weeks   2.HTN andCongestive heart failure  -On Cardizem, Lasix 20mg  daily   3. History of stroke and dementia -On Xarelto 10mg  daily -On Aricept 10mg  once daily and Namenda 10mg  twice daily -pt is able to do all ADLs, lives with her daughter and son-in-law, has good support -Followed in neurology by Dr. Krista Blue   4. Depression and anxiety -On Prozac 40mg  daily  5. Fatigue, dyspnea, tachypnea, chillswithout fever on 10/25/18  -Xeloda held 7/28-8/3 for above symptoms -she received 500 cc NS on 7/28  -she was found to have UTI with labs last week. Blood culture and chest xray negative  -She completed antibiotics with bactrim.  -She has improved appetite eating solid food and more energy, afebrile. She does all her personal care herself. Rectal pain and GI bleeding is minimal.    PLAN: -Labs reviewed and adequate to proceed with last dose Xeloda 1000mg  BID tonight. She will complete radiaiton today  -Labs and f/u in 4 weeks    No problem-specific Assessment & Plan notes found for this encounter.   No orders of the defined types were placed in this encounter.  All questions were answered. The patient knows to call the clinic with any problems, questions or concerns. No barriers to learning was detected. I spent 15 minutes counseling the patient face to face. The total time spent in the appointment was 20 minutes and more than 50% was on counseling and review of test results     Truitt Merle, MD 11/07/2018   I, Joslyn Devon, am acting as scribe for Truitt Merle, MD.   I have reviewed the above documentation for accuracy and completeness, and I agree with the above.

## 2018-11-04 ENCOUNTER — Ambulatory Visit
Admission: RE | Admit: 2018-11-04 | Discharge: 2018-11-04 | Disposition: A | Discharge status: Home / Self Care | Payer: Medicare Other | Source: Ambulatory Visit | Attending: Radiation Oncology | Admitting: Radiation Oncology

## 2018-11-04 ENCOUNTER — Ambulatory Visit: Payer: Medicare Other

## 2018-11-04 DIAGNOSIS — C20 Malignant neoplasm of rectum: Secondary | ICD-10-CM | POA: Diagnosis not present

## 2018-11-04 DIAGNOSIS — Z510 Encounter for antineoplastic radiation therapy: Secondary | ICD-10-CM | POA: Diagnosis not present

## 2018-11-07 ENCOUNTER — Inpatient Hospital Stay (HOSPITAL_BASED_OUTPATIENT_CLINIC_OR_DEPARTMENT_OTHER): Payer: Medicare Other | Admitting: Hematology

## 2018-11-07 ENCOUNTER — Encounter: Payer: Self-pay | Admitting: Hematology

## 2018-11-07 ENCOUNTER — Encounter: Payer: Self-pay | Admitting: Radiation Oncology

## 2018-11-07 ENCOUNTER — Other Ambulatory Visit: Payer: Self-pay

## 2018-11-07 ENCOUNTER — Inpatient Hospital Stay: Payer: Medicare Other

## 2018-11-07 ENCOUNTER — Ambulatory Visit
Admission: RE | Admit: 2018-11-07 | Discharge: 2018-11-07 | Disposition: A | Discharge status: Home / Self Care | Payer: Medicare Other | Source: Ambulatory Visit | Attending: Radiation Oncology | Admitting: Radiation Oncology

## 2018-11-07 VITALS — BP 114/53 | HR 92 | Temp 99.1°F | Resp 18 | Ht 67.0 in | Wt 140.6 lb

## 2018-11-07 DIAGNOSIS — F329 Major depressive disorder, single episode, unspecified: Secondary | ICD-10-CM | POA: Diagnosis not present

## 2018-11-07 DIAGNOSIS — C20 Malignant neoplasm of rectum: Secondary | ICD-10-CM | POA: Diagnosis not present

## 2018-11-07 DIAGNOSIS — F0390 Unspecified dementia without behavioral disturbance: Secondary | ICD-10-CM | POA: Diagnosis not present

## 2018-11-07 DIAGNOSIS — I509 Heart failure, unspecified: Secondary | ICD-10-CM | POA: Diagnosis not present

## 2018-11-07 DIAGNOSIS — Z510 Encounter for antineoplastic radiation therapy: Secondary | ICD-10-CM | POA: Diagnosis not present

## 2018-11-07 DIAGNOSIS — I110 Hypertensive heart disease with heart failure: Secondary | ICD-10-CM | POA: Diagnosis not present

## 2018-11-07 DIAGNOSIS — F419 Anxiety disorder, unspecified: Secondary | ICD-10-CM | POA: Diagnosis not present

## 2018-11-07 LAB — CBC WITH DIFFERENTIAL (CANCER CENTER ONLY)
Abs Immature Granulocytes: 0.04 10*3/uL (ref 0.00–0.07)
Basophils Absolute: 0 10*3/uL (ref 0.0–0.1)
Basophils Relative: 0 %
Eosinophils Absolute: 0 10*3/uL (ref 0.0–0.5)
Eosinophils Relative: 0 %
HCT: 33.9 % — ABNORMAL LOW (ref 36.0–46.0)
Hemoglobin: 11 g/dL — ABNORMAL LOW (ref 12.0–15.0)
Immature Granulocytes: 1 %
Lymphocytes Relative: 7 %
Lymphs Abs: 0.4 10*3/uL — ABNORMAL LOW (ref 0.7–4.0)
MCH: 31.3 pg (ref 26.0–34.0)
MCHC: 32.4 g/dL (ref 30.0–36.0)
MCV: 96.6 fL (ref 80.0–100.0)
Monocytes Absolute: 0.4 10*3/uL (ref 0.1–1.0)
Monocytes Relative: 7 %
Neutro Abs: 4.6 10*3/uL (ref 1.7–7.7)
Neutrophils Relative %: 85 %
Platelet Count: 202 10*3/uL (ref 150–400)
RBC: 3.51 MIL/uL — ABNORMAL LOW (ref 3.87–5.11)
RDW: 19.7 % — ABNORMAL HIGH (ref 11.5–15.5)
WBC Count: 5.4 10*3/uL (ref 4.0–10.5)
nRBC: 0 % (ref 0.0–0.2)

## 2018-11-07 LAB — CMP (CANCER CENTER ONLY)
ALT: 26 U/L (ref 0–44)
AST: 34 U/L (ref 15–41)
Albumin: 3.2 g/dL — ABNORMAL LOW (ref 3.5–5.0)
Alkaline Phosphatase: 74 U/L (ref 38–126)
Anion gap: 10 (ref 5–15)
BUN: 13 mg/dL (ref 8–23)
CO2: 25 mmol/L (ref 22–32)
Calcium: 8.8 mg/dL — ABNORMAL LOW (ref 8.9–10.3)
Chloride: 102 mmol/L (ref 98–111)
Creatinine: 0.71 mg/dL (ref 0.44–1.00)
GFR, Est AFR Am: >60 mL/min (ref >60)
GFR, Estimated: >60 mL/min (ref >60)
Glucose, Bld: 100 mg/dL — ABNORMAL HIGH (ref 70–99)
Potassium: 3.7 mmol/L (ref 3.5–5.1)
Sodium: 137 mmol/L (ref 135–145)
Total Bilirubin: 0.7 mg/dL (ref 0.3–1.2)
Total Protein: 5.8 g/dL — ABNORMAL LOW (ref 6.5–8.1)

## 2018-11-09 ENCOUNTER — Telehealth: Payer: Self-pay | Admitting: Hematology

## 2018-11-09 NOTE — Telephone Encounter (Signed)
Scheduled appt per 8/10 los.  Spoke with patient daughter and she is aware of the appt date and time

## 2018-11-11 NOTE — Progress Notes (Signed)
  Radiation Oncology         [phone] ________________________________  Name: Megan Munoz MRN: [id number]  Date: 09/21/2018  DOB: 09-22-1932    SIMULATION AND TREATMENT PLANNING NOTE  DIAGNOSIS:     ICD-10-CM   1. Rectal cancer ([place])  C20      The patient presented for simulation for the patient's upcoming course of radiation for the diagnosis of rectal cancer. The patient was placed in a supine position. A customized vac-lock bag was constructed to aid in patient immobilization on. This complex treatment device will be used on a daily basis during the treatment. In this fashion a CT scan was obtained through the pelvic region and the isocenter was placed near midline within the pelvis. Surface markings were placed.  The patient's imaging was loaded into the radiation treatment planning system. The patient will initially be planned to receive a course of radiation to a dose of 45 Gy. This will be accomplished in 25 fractions at 1.8 gray per fraction. This initial treatment will correspond to a 3-D conformal technique. The target volume has been contoured in addition to the rectum, bladder and femoral heads. Dose volume histograms of each of these structures have been requested and these will be carefully reviewed as part of the 3-D conformal treatment planning process. To accomplish this initial treatment, 4 customized blocks have been designed for this purpose. Each of these 4 complex treatment devices will be used on a daily basis during the initial course of the treatment. It is anticipated that the patient will then receive a boost for an additional 9 Gy. The anticipated total dose therefore will be 54 Gy.    Special treatment procedure The patient will receive chemotherapy during the course of radiation treatment. The patient may experience increased or overlapping toxicity due to this combined-modality approach and the patient will be monitored for such problems. This may include extra  lab work as necessary. This therefore constitutes a special treatment procedure.    ________________________________  [name], MD, PhD

## 2018-11-11 NOTE — Progress Notes (Signed)
  Radiation Oncology         [phone]) [phone] ________________________________  Name: Arthelia Callicott MRN: [id number]  Date: 09/21/2018  DOB: Oct 03, 1932  Optical Surface Tracking Plan:  Since intensity modulated radiotherapy (IMRT) and 3D conformal radiation treatment methods are predicated on accurate and precise positioning for treatment, intrafraction motion monitoring is medically necessary to ensure accurate and safe treatment delivery.  The ability to quantify intrafraction motion without excessive ionizing radiation dose can only be performed with optical surface tracking. Accordingly, surface imaging offers the opportunity to obtain 3D measurements of patient position throughout IMRT and 3D treatments without excessive radiation exposure.  I am ordering optical surface tracking for this patient's upcoming course of radiotherapy. ________________________________  Kyung Rudd, MD 11/11/2018 9:40 AM    Reference:   Ursula Alert, J, et al. Surface imaging-based analysis of intrafraction motion for breast radiotherapy patients.Journal of [place], n. 6, nov. 2014. ISSN [id number].   Available at: <http://www.jacmp.org/index.php/jacmp/article/view/4957>.

## 2018-11-15 ENCOUNTER — Telehealth: Payer: Self-pay | Admitting: *Deleted

## 2018-11-15 NOTE — Telephone Encounter (Signed)
OK to watch for now, and call us if her temperature 100.4 or high, or if she develops new symptoms. Thanks    Truitt Merle MD

## 2018-11-15 NOTE — Telephone Encounter (Signed)
Daughter states Mrs Baley had a temperature last Wednesday night of 101.8 via temporal thermometer ( last day of radiation). They gave her tylenol and it came down to 100.0, states she has ranged from 99.5 to 99.8 for the last few days. No tylenol given. Denies any signs of infection, cough or chills. Pt is eating and drinking without problems. States patient is "just blah". Wants to know if they just need to keep an eye on her temperature or get her in to be seen?

## 2018-11-15 NOTE — Telephone Encounter (Signed)
Notified of message below. Verbalized understanding 

## 2018-11-17 ENCOUNTER — Telehealth: Payer: Self-pay | Admitting: Hematology

## 2018-11-17 NOTE — Telephone Encounter (Signed)
[place] 9/9 moved lab/fu to 9/14 with [facility]. Confirmed with patient dtr. Scheduled with [facility] due dtr does not want to push f/u too far out from 9/9.

## 2018-12-07 ENCOUNTER — Other Ambulatory Visit: Payer: Medicare Other

## 2018-12-07 ENCOUNTER — Telehealth: Payer: Self-pay | Admitting: Radiation Oncology

## 2018-12-07 ENCOUNTER — Ambulatory Visit: Payer: Medicare Other | Admitting: Hematology

## 2018-12-07 MED ORDER — SILVER SULFADIAZINE 1 % EX CREA: 1.0000 "application " | TOPICAL_CREAM | Freq: Two times a day (BID) | CUTANEOUS | 0 refills | Status: DC

## 2018-12-07 NOTE — Progress Notes (Signed)
  Radiation Oncology         [phone]) [phone] ________________________________  Name: Megan Munoz MRN: [id number]  Date: 11/07/2018  DOB: 12-Oct-1932  End of Treatment Note  Diagnosis:   Rectal cancer    Cancer Staging Rectal cancer [facility]) Staging form: Colon and Rectum, AJCC 8th Edition - Clinical: Stage IIA (cT3, cN0, cM0) - Signed by Truitt Merle, MD on 09/21/2018   Indication for treatment:  Curative       Radiation treatment dates:   09/26/18 - 11/07/18  Site/dose:    The patient was treated to the pelvis to a dose of 45 Gy at 1.8 Gy per fraction. This was accomplished using a 4 field 3-D conformal technique. The patient then received a boost to the tumor and adjacent high-risk regions for an additional 5.4 Gy at 1.8 gray per fraction. This was carried out using a coned-down 4 field approach. The patient's total dose was 50.4 Gy. Daily AlignRT was used on a daily basis to insure proper patient positioning and localization of critical targets/ structures. The patient received concurrent chemotherapy during the course of radiation treatment.  Narrative: The patient tolerated radiation treatment relatively well.   Local symptoms were managed symptomatically.  Plan: The patient has completed radiation treatment. The patient will return to radiation oncology clinic for routine followup in one month. I advised the patient to call or return sooner if they have any questions or concerns related to their recovery or treatment.   ------------------------------------------------  [name], MD, PhD

## 2018-12-07 NOTE — Telephone Encounter (Signed)
  Radiation Oncology         [phone]) [phone] ________________________________  Name: Megan Munoz MRN: [id number]  Date of Service: 12/07/2018  DOB: 08/01/1932  Post Treatment Telephone Note  Diagnosis:   Stage IIA, cT3dN0Mx clinical carcinoma of the rectum  Interval Since Last Radiation:  4 weeks   09/26/18 - 11/07/18: The patient was treated to the pelvis to a dose of 45 Gy at 1.8 Gy per fraction. This was accomplished using a 4 field 3-D conformal technique. The patient then received a boost to the tumor and adjacent high-risk regions for an additional 5.4 Gy at 1.8 gray per fraction. This was carried out using a coned-down 4 field approach. The patient's total dose was 50.4 Gy. Daily AlignRT was used on a daily basis to insure proper patient positioning and localization of critical targets/ structures. The patient received concurrent chemotherapy during the course of radiation treatment.  Narrative:  The patient was contacted today for routine follow-up. During treatment she did very well with radiotherapy but did have modest dry desquamation. Her daughter reports she is doing better since also being diagnosed and treated for a UTI, she has lost about 10 pounds since her treatment ended, but is starting to increase her oral intake. Her skin is also improved and bowel function seems to be normalizing more so. Her daughter requested more silvadene.  Impression/Plan: 1. Stage IIA, cT3dN0Mx clinical carcinoma of the rectum. The patient has been doing well since completion of radiotherapy. We discussed that we would be happy to continue to follow her as needed, but she will also continue to follow up with Dr. Burr Medico in medical oncology, and she is to see Dr. Johney Maine but is unsure of her appointment. A new rx was sent in for more silvadene. We will follow along with her course expectantly.    Carola Rhine, PAC

## 2018-12-12 ENCOUNTER — Inpatient Hospital Stay: Payer: Medicare Other | Attending: Nurse Practitioner | Admitting: *Deleted

## 2018-12-12 ENCOUNTER — Inpatient Hospital Stay (HOSPITAL_BASED_OUTPATIENT_CLINIC_OR_DEPARTMENT_OTHER): Payer: Medicare Other | Admitting: Nurse Practitioner

## 2018-12-12 ENCOUNTER — Other Ambulatory Visit: Payer: Self-pay

## 2018-12-12 ENCOUNTER — Telehealth: Payer: Self-pay | Admitting: Nurse Practitioner

## 2018-12-12 ENCOUNTER — Other Ambulatory Visit: Payer: Self-pay | Admitting: Nurse Practitioner

## 2018-12-12 VITALS — BP 118/57 | HR 82 | Temp 98.2°F | Resp 17 | Ht 67.0 in | Wt 129.3 lb

## 2018-12-12 DIAGNOSIS — Z79899 Other long term (current) drug therapy: Secondary | ICD-10-CM | POA: Diagnosis not present

## 2018-12-12 DIAGNOSIS — Z7901 Long term (current) use of anticoagulants: Secondary | ICD-10-CM | POA: Insufficient documentation

## 2018-12-12 DIAGNOSIS — C20 Malignant neoplasm of rectum: Secondary | ICD-10-CM | POA: Diagnosis not present

## 2018-12-12 DIAGNOSIS — E876 Hypokalemia: Secondary | ICD-10-CM | POA: Insufficient documentation

## 2018-12-12 DIAGNOSIS — Z8673 Personal history of transient ischemic attack (TIA), and cerebral infarction without residual deficits: Secondary | ICD-10-CM | POA: Diagnosis not present

## 2018-12-12 DIAGNOSIS — I110 Hypertensive heart disease with heart failure: Secondary | ICD-10-CM | POA: Diagnosis not present

## 2018-12-12 DIAGNOSIS — I4821 Permanent atrial fibrillation: Secondary | ICD-10-CM | POA: Diagnosis not present

## 2018-12-12 LAB — CMP (CANCER CENTER ONLY)
ALT: 11 U/L (ref 0–44)
AST: 23 U/L (ref 15–41)
Albumin: 3.6 g/dL (ref 3.5–5.0)
Alkaline Phosphatase: 85 U/L (ref 38–126)
Anion gap: 6 (ref 5–15)
BUN: 12 mg/dL (ref 8–23)
CO2: 29 mmol/L (ref 22–32)
Calcium: 8.9 mg/dL (ref 8.9–10.3)
Chloride: 107 mmol/L (ref 98–111)
Creatinine: 0.69 mg/dL (ref 0.44–1.00)
GFR, Est AFR Am: >60 mL/min (ref >60)
GFR, Estimated: >60 mL/min (ref >60)
Glucose, Bld: 113 mg/dL — ABNORMAL HIGH (ref 70–99)
Potassium: 3.8 mmol/L (ref 3.5–5.1)
Sodium: 142 mmol/L (ref 135–145)
Total Bilirubin: 0.6 mg/dL (ref 0.3–1.2)
Total Protein: 6.8 g/dL (ref 6.5–8.1)

## 2018-12-12 LAB — CBC WITH DIFFERENTIAL (CANCER CENTER ONLY)
Abs Immature Granulocytes: 0.01 10*3/uL (ref 0.00–0.07)
Basophils Absolute: 0 10*3/uL (ref 0.0–0.1)
Basophils Relative: 0 %
Eosinophils Absolute: 0 10*3/uL (ref 0.0–0.5)
Eosinophils Relative: 0 %
HCT: 37.5 % (ref 36.0–46.0)
Hemoglobin: 11.8 g/dL — ABNORMAL LOW (ref 12.0–15.0)
Immature Granulocytes: 0 %
Lymphocytes Relative: 33 %
Lymphs Abs: 1.3 10*3/uL (ref 0.7–4.0)
MCH: 32.3 pg (ref 26.0–34.0)
MCHC: 31.5 g/dL (ref 30.0–36.0)
MCV: 102.7 fL — ABNORMAL HIGH (ref 80.0–100.0)
Monocytes Absolute: 0.3 10*3/uL (ref 0.1–1.0)
Monocytes Relative: 7 %
Neutro Abs: 2.3 10*3/uL (ref 1.7–7.7)
Neutrophils Relative %: 60 %
Platelet Count: 187 10*3/uL (ref 150–400)
RBC: 3.65 MIL/uL — ABNORMAL LOW (ref 3.87–5.11)
RDW: 17.4 % — ABNORMAL HIGH (ref 11.5–15.5)
WBC Count: 3.8 10*3/uL — ABNORMAL LOW (ref 4.0–10.5)
nRBC: 0 % (ref 0.0–0.2)

## 2018-12-12 LAB — CEA (IN HOUSE-CHCC): CEA (CHCC-In House): 3.45 ng/mL (ref 0.00–5.00)

## 2018-12-12 NOTE — Telephone Encounter (Signed)
Scheduled appt per 9/14 los.  Spoke with patient daughter and she is aware of the appt date and time.

## 2018-12-12 NOTE — Progress Notes (Signed)
[place]   [phone] [phone]) [phone]   Clinic Follow up Note   Patient Care Team: Fanny Bien, MD as PCP - General (Family Medicine) Leonie Man, MD as PCP - Cardiology (Cardiology) Ronnette Juniper, MD as Consulting Physician (Gastroenterology) Arna Snipe, RN as Oncology Nurse Navigator Michael Boston, MD as Consulting Physician (General Surgery) Truitt Merle, MD as Consulting Physician (Hematology) Kyung Rudd, MD as Consulting Physician (Radiation Oncology) Date of service: 12/12/18   CHIEF COMPLAINT: f/u rectal cancer   SUMMARY OF ONCOLOGIC HISTORY: Oncology History Overview Note  Cancer Staging Rectal cancer [facility]) Staging form: Colon and Rectum, AJCC 8th Edition - Clinical: Stage IIA (cT3, cN0, cM0) - Signed by Truitt Merle, MD on 09/21/2018    Rectal cancer ([place])  06/01/2018 Imaging   COLONOSCOPY - Firm, nodular, thickened, raised area felt at the anal verge found on perianal exam. 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 Biopsied. - Two 6 to 8 mm polyps in the transverse colon, removed with a hot snare. Resected and retrieved. - One 7 mm polyp in the ascending colon, removed with a hot snare. Resected and retrieved. - One 6 mm polyp in the cecum, removed with a hot snare. Resected and retrieved. - One 4 mm polyp at the hepatic flexure, removed with a cold biopsy forceps. Resected and retrieved. - One 9 mm polyp in the descending colon, removed with a hot snare. Resected and retrieved. - Diverticulosis in the sigmoid colon and in the descending colon.   06/01/2018 Initial Biopsy   Pathology from colonoscopy:  1. All colon polyps negative for malignancy. 2. Tubulovillous adenoma with at least high grade dysplasia at the rectal tumor. There were several foci highly suspicious but not definitive for invasive adenocarcinoma.    09/03/2018 Imaging   PELVIC MRI IMPRESSION: 1. 3.0 cm polypoid mass along the right anterolateral aspect of the anal  verge, as described above. 2. Given the location, it is unclear whether this is more appropriately characterized as rectal cancer versus anal cancer. Regardless, given the clinical information, rectal cancer imaging characterization has been provided. 3. Rectal adenocarcinoma T stage: T3d 4. Rectal adenocarcinoma N stage:  N0 5. Distance from tumor to the internal anal sphincter is 0 cm.   09/19/2018 Miscellaneous   TUMOR BOARD: concurrent radiation therapy and chemotherapy with Xeloda   09/21/2018 Initial Diagnosis   Rectal cancer ([place])   09/21/2018 Cancer Staging   Staging form: Colon and Rectum, AJCC 8th Edition - Clinical: Stage IIA (cT3, cN0, cM0) - Signed by Truitt Merle, MD on 09/21/2018   09/26/2018 - 11/07/2018 Radiation Therapy   ChemoRT with Xeloda beginning on 09/26/18 by Dr Lisbeth Renshaw ending on 11/07/18.    09/26/2018 - 11/07/2018 Chemotherapy   ChemoRT with Xeloda beginning on 09/26/18, Xeloda 1500mg  q12h on days of radiation. Dose reduced to 1000 mg on 7/27 PM and 7/28 AM; held Xeloda on 10/25/18 due to fatigue, chills, and UTI. Restarted on 10/31/18 at 1000mg  BID. Completed on 8/10.       PRIOR THERAPY: concurrent chemoRT with Xeloda 1500 mg BID M-F on days with radiation, began 09/26/18, reduced to 1000 mg on 7/27 PM and 7/28 AM; heldXelodaon 10/25/18 due to fatigue, chills, and UTI. Restarted on 10/31/18 at 1000mg  BID. Completed 11/07/18.  INTERVAL HISTORY: Ms. Manella returns for f/u as scheduled. She completed chemoRT with Xeloda on 11/07/18. Her daughter Maudie Mercury was called to be included in today's visit. She feels things are going well. Her fatigue and  appetite have improved over last couple weeks. She lost weight after treatment but has remained stable over the last 6 days per Maudie Mercury. She is eating more amount and variety and 1 ensure per day. She is mostly independent of ADLs. Has 1-2 BM per day, stools are soft and occasionally formed. Denies blood in stool, incontinence, rectal pain or pressure.  Denies urinary issues. Skin is healing. She uses silvadene. Denies fever, chills, cough, chest pain, leg swelling, or hand/foot redness or pain.    MEDICAL HISTORY:  Past Medical History:  Diagnosis Date   Anxiety    Atherosclerosis of aortic arch [facility]) April 2017   Noted on CT chest, calcified aortic atherosclerosis at the arch.   Atrial fibrillation, permanent 03/2013   Previously on Atenolol for Rate - now on combination diltiazem and carvedilol for rate Control;  & Xarelto for AC. CHADS2VASC2 = 7.   Cerebral infarction [facility]) 10/2008   MRI Brain 05/2014 for near syncope revealed: No acure infarct.  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   Mild small vessel disease type changes.  No intracranial hemorrhage.  Global atrophy.    CHF (congestive heart failure) (HCC)    COPD (chronic obstructive pulmonary disease) ([place])    Coronary atherosclerosis due to calcified coronary lesion April 2017   Noted on CT chest   Dementia [facility])    following 2010 stroke   Hyperlipemia    Hypertension    Pneumonia    PONV (postoperative nausea and vomiting)    Stroke ([place])    2010- patient has dementia related to stroke    Tinea unguium    Tremor    Tuberculosis     SURGICAL HISTORY: Past Surgical History:  Procedure Laterality Date   BIOPSY  06/01/2018   Procedure: BIOPSY;  Surgeon: Ronnette Juniper, MD;  Location: [facility];  Service: Gastroenterology;;   BLADDER SURGERY     CARPAL TUNNEL RELEASE Left    COLONOSCOPY N/A 06/01/2018   Procedure: COLONOSCOPY;  Surgeon: Ronnette Juniper, MD;  Location: [facility];  Service: Gastroenterology;  Laterality: N/A;   ESOPHAGOGASTRODUODENOSCOPY (EGD) WITH PROPOFOL N/A 02/04/2018   Procedure: ESOPHAGOGASTRODUODENOSCOPY (EGD) WITH PROPOFOL;  Surgeon: Ronnette Juniper, MD;  Location: [place];  Service: Gastroenterology;  Laterality: N/A;   FOOT SURGERY  Right    HERNIA REPAIR     NASAL SINUS SURGERY     POLYPECTOMY  06/01/2018   Procedure: POLYPECTOMY;  Surgeon: Ronnette Juniper, MD;  Location: [facility];  Service: Gastroenterology;;   TOTAL ABDOMINAL HYSTERECTOMY      I have reviewed the social history and family history with the patient and they are unchanged from previous note.  ALLERGIES:  is allergic to tetanus toxoids.  MEDICATIONS:  Current Outpatient Medications  Medication Sig Dispense Refill   albuterol (PROVENTIL) (2.5 MG/3ML) 0.083% nebulizer solution Take 3 mLs (2.5 mg total) by nebulization every 6 (six) hours as needed for wheezing or shortness of breath. 30 vial 1   Azelastine HCl 137 MCG/SPRAY SOLN Place 2 sprays into both nostrils 2 (two) times daily. Use in each nostril as directed      B Complex Vitamins (VITAMIN B COMPLEX PO) Take 1 tablet by mouth daily.      BISACODYL 5 MG EC tablet TK AS DIRECTED     calcitonin, salmon, (MIACALCIN) 200 UNIT/ACT nasal spray Place 1 spray into alternate nostrils daily.     capecitabine (XELODA) 500  MG tablet Take 3 tablets (1,500 mg total) by mouth 2 (two) times daily after a meal. Take on days of radiation, Monday through Friday 168 tablet 0   Cholecalciferol (VITAMIN D3 SUPER STRENGTH) 2000 units CAPS Take 2,000 Units by mouth daily.      diltiazem (CARDIZEM CD) 120 MG 24 hr capsule TAKE 3 CAPSULES DAILY (PLEASE SCHEDULE AN APPOINTMENT FOR FURTHER REFILLS) (Patient taking differently: Take 360 mg by mouth daily. ) 360 capsule 4   docusate sodium (COLACE) 100 MG capsule Take 100-200 mg by mouth 2 (two) times daily as needed for mild constipation. Pt does take at least 100 mg daily     donepezil (ARICEPT) 10 MG tablet Take 1 tablet (10 mg total) by mouth at bedtime. 90 tablet 3   feeding supplement, ENSURE ENLIVE, (ENSURE ENLIVE) LIQD Take 237 mLs by mouth 3 (three) times daily between meals. (Patient taking differently: Take 237 mLs by mouth See admin instructions. 2-3   Times daily) 237 mL 12   ferrous sulfate 325 (65 FE) MG tablet Take 1 tablet (325 mg total) by mouth every other day. 30 tablet 0   FLUoxetine (PROZAC) 40 MG capsule Take 40 mg by mouth daily.      Fluticasone-Umeclidin-Vilant (TRELEGY ELLIPTA) 100-62.5-25 MCG/INH AEPB Inhale 1 puff into the lungs daily.     furosemide (LASIX) 20 MG tablet Take 1 tablet (20 mg total) by mouth daily as needed for fluid or edema. (Patient taking differently: Take 20 mg by mouth every other day. ) 30 tablet 6   loratadine (CLARITIN) 10 MG tablet Take 10 mg by mouth daily.     Melatonin 10 MG TABS Take 10 mg by mouth at bedtime.      memantine (NAMENDA) 10 MG tablet Take 1 tablet (10 mg total) by mouth 2 (two) times daily. 180 tablet 3   Multiple Vitamin (MULTIVITAMIN) capsule Take 1 capsule by mouth daily.     rivaroxaban (XARELTO) 10 MG TABS tablet Take 1 tablet (10 mg total) by mouth daily. 30 tablet 1   silver sulfADIAZINE (SILVADENE) 1 % cream Apply 1 application topically 2 (two) times daily. 50 g 0   simvastatin (ZOCOR) 40 MG tablet Take 40 mg by mouth daily.     ondansetron (ZOFRAN) 8 MG tablet TAKE 0.5-1 TABLET (4-8 MG TOTAL) BY MOUTH EVERY 8 (EIGHT) HOURS AS NEEDED FOR NAUSEA OR VOMITING. 30 tablet 0   potassium chloride SA (K-DUR) 20 MEQ tablet Take 1 tablet (20 mEq total) by mouth daily. 30 tablet 0   sulfamethoxazole-trimethoprim (BACTRIM DS) 800-160 MG tablet Take 1 tablet by mouth 2 (two) times daily. 6 tablet 0   No current facility-administered medications for this visit.     PHYSICAL EXAMINATION: ECOG PERFORMANCE STATUS: 2 - Symptomatic, <50% confined to bed  Vitals:   12/12/18 1128  BP: (!) 118/57  Pulse: 82  Resp: 17  Temp: 98.2 F (36.8 C)  SpO2: 100%   Filed Weights   12/12/18 1128  Weight: 129 lb 4.8 oz (58.7 kg)    GENERAL:alert, no distress and comfortable SKIN: no rash.  EYES: sclera clear OROPHARYNX: no thrush or ulcers LUNGS: clear to auscultation with  normal breathing effort HEART: regular rate & rhythm, no lower extremity edema ABDOMEN: abdomen soft, non-tender and normal bowel sounds. Marked improvement of skin erythema to buttock and perianal area, no open wound Rectal exam reveals no external mass. Clear rectal discharge was noted. DRE: Mild thickening in the anterior anal wall. No  blood on glove Musculoskeletal: no cyanosis of digits and no clubbing  NEURO: alert & oriented x 3 with fluent speech; resting tremor. Up to exam table independently    LABORATORY DATA:  I have reviewed the data as listed CBC Latest Ref Rng & Units 12/12/2018 11/07/2018 10/31/2018  WBC 4.0 - 10.5 K/uL 3.8(L) 5.4 3.0(L)  Hemoglobin 12.0 - 15.0 g/dL 11.8(L) 11.0(L) 10.6(L)  Hematocrit 36.0 - 46.0 % 37.5 33.9(L) 31.9(L)  Platelets 150 - 400 K/uL 187 202 159     CMP Latest Ref Rng & Units 12/12/2018 11/07/2018 10/31/2018  Glucose 70 - 99 mg/dL 113(H) 100(H) 87  BUN 8 - 23 mg/dL 12 13 12   Creatinine 0.44 - 1.00 mg/dL 0.69 0.71 0.66  Sodium 135 - 145 mmol/L 142 137 138  Potassium 3.5 - 5.1 mmol/L 3.8 3.7 3.7  Chloride 98 - 111 mmol/L 107 102 104  CO2 22 - 32 mmol/L 29 25 27   Calcium 8.9 - 10.3 mg/dL 8.9 8.8(L) 8.3(L)  Total Protein 6.5 - 8.1 g/dL 6.8 5.8(L) 5.4(L)  Total Bilirubin 0.3 - 1.2 mg/dL 0.6 0.7 0.5  Alkaline Phos 38 - 126 U/L 85 74 65  AST 15 - 41 U/L 23 34 45(H)  ALT 0 - 44 U/L 11 26 37      RADIOGRAPHIC STUDIES: I have personally reviewed the radiological images as listed and agreed with the findings in the report. No results found.   ASSESSMENT & PLAN: 85 yo female  1. Distal rectal adenocarcinoma, cT3N0M0, stage IIA -She began concurrent chemoRT with Xeloda1500 mg BID on M-F on 09/26/18. -toleratingwell until end of week 4 when she developed abrupt moderate to severe fatigue. Xeloda was dose reduced Xeloda to 1000 mg 7/27 PM and 7/28 AM until she was seen in person for evaluation. -Xeloda on hold since 7/28 - 8/3; she continued  radiation  -She tolerated treatment better with reduced dose 1000 mg BID, she ultimately completed chemoRT on 11/07/18 -Ms. Morini lost weight after treatment, but continues to recover. Skin is much improved. She is gaining strength and remains mostly independent.  -Dr. Burr Medico does not plan to give more chemo, she is due for f/u with Dr. Johney Maine   2. UTI, chills without fever on 10/25/18  -Xeloda held temporarily,  -she received 500 cc NS on 7/28  -she was found to have UTI, treated with bactrim -treatment was resumed at reduced dose -resolved  3. Hypokalemia  -stopped taking after completing treatment -resolved  4. Afib -on Xarelto  5. CHF -controlled on cardizem and lasix (every other day for 1-2 years)  Disposition:   Ms. Taveras appears stable. She continues to recover from chemoRT. BMs are soft, occasionally formed. Skin has improved significantly. Physical exam reveals thickening in the anal canal. Labs continue to show mild anemia, Hgb 11.8; CMP unremarkable. CEA is normal, no pre-treatment comparison. I encouraged her to continue nutrition and increase physical activity, improve her strength and PS. Her daughter Maudie Mercury was on the phone today and she will call Dr. Clyda Greener office to f/u. She will return in 4 weeks for lab and f/u with Dr. Burr Medico, we will order scans at that visit to evaluate her response to treatment.  All questions were answered. The patient knows to call the clinic with any problems, questions or concerns. No barriers to learning was detected. I spent 20 minutes counseling the patient face to face. The total time spent in the appointment was  25 minutes and more than 50% was on  counseling and review of test results     Alla Feeling, NP 12/13/18

## 2018-12-13 ENCOUNTER — Telehealth: Payer: Self-pay | Admitting: *Deleted

## 2018-12-13 ENCOUNTER — Encounter: Payer: Self-pay | Admitting: Nurse Practitioner

## 2018-12-13 NOTE — Telephone Encounter (Signed)
Per Cira Rue, NP, called pt and daughter Daine Gip with normal CEA results and to follow up as scheduled. Pt and daughter verbalized understanding.

## 2018-12-13 NOTE — Progress Notes (Signed)
Called number listed, voicemail box is full. Will call back

## 2018-12-13 NOTE — Telephone Encounter (Signed)
-----   Message from Alla Feeling, NP sent at 12/13/2018  8:44 AM EDT ----- Please call patient and daughter Maudie Mercury, CEA is normal. No concerns. Will serve as baseline for monitoring going forward. Thanks, Regan Rakers

## 2018-12-13 NOTE — Progress Notes (Signed)
Called number listed. Pt voicemail was full. Will callback with normal CEA results

## 2018-12-20 DIAGNOSIS — C20 Malignant neoplasm of rectum: Secondary | ICD-10-CM | POA: Diagnosis not present

## 2019-01-09 NOTE — Progress Notes (Signed)
[place]   [phone] [phone]   Clinic Follow up Note   Patient Care Team: Fanny Bien, MD as PCP - General (Family Medicine) Leonie Man, MD as PCP - Cardiology (Cardiology) Ronnette Juniper, MD as Consulting Physician (Gastroenterology) Arna Snipe, RN as Oncology Nurse Navigator Michael Boston, MD as Consulting Physician (General Surgery) Truitt Merle, MD as Consulting Physician (Hematology) Kyung Rudd, MD as Consulting Physician (Radiation Oncology)  Date of Service:  01/12/2019  CHIEF COMPLAINT: F/u of rectal carcinoma  SUMMARY OF ONCOLOGIC HISTORY: Oncology History Overview Note  Cancer Staging Rectal cancer [facility]) Staging form: Colon and Rectum, AJCC 8th Edition - Clinical: Stage IIA (cT3, cN0, cM0) - Signed by Truitt Merle, MD on 09/21/2018    Rectal cancer ([place])  06/01/2018 Imaging   COLONOSCOPY - Firm, nodular, thickened, raised area felt at the anal verge found on perianal exam. 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 Biopsied. - Two 6 to 8 mm polyps in the transverse colon, removed with a hot snare. Resected and retrieved. - One 7 mm polyp in the ascending colon, removed with a hot snare. Resected and retrieved. - One 6 mm polyp in the cecum, removed with a hot snare. Resected and retrieved. - One 4 mm polyp at the hepatic flexure, removed with a cold biopsy forceps. Resected and retrieved. - One 9 mm polyp in the descending colon, removed with a hot snare. Resected and retrieved. - Diverticulosis in the sigmoid colon and in the descending colon.   06/01/2018 Initial Biopsy   Pathology from colonoscopy:  1. All colon polyps negative for malignancy. 2. Tubulovillous adenoma with at least high grade dysplasia at the rectal tumor. There were several foci highly suspicious but not definitive for invasive adenocarcinoma.    09/03/2018 Imaging   PELVIC MRI IMPRESSION: 1. 3.0 cm polypoid mass along the right anterolateral aspect  of the anal verge, as described above. 2. Given the location, it is unclear whether this is more appropriately characterized as rectal cancer versus anal cancer. Regardless, given the clinical information, rectal cancer imaging characterization has been provided. 3. Rectal adenocarcinoma T stage: T3d 4. Rectal adenocarcinoma N stage:  N0 5. Distance from tumor to the internal anal sphincter is 0 cm.   09/19/2018 Miscellaneous   TUMOR BOARD: concurrent radiation therapy and chemotherapy with Xeloda   09/21/2018 Initial Diagnosis   Rectal cancer ([place])   09/21/2018 Cancer Staging   Staging form: Colon and Rectum, AJCC 8th Edition - Clinical: Stage IIA (cT3, cN0, cM0) - Signed by Truitt Merle, MD on 09/21/2018   09/26/2018 - 11/07/2018 Radiation Therapy   ChemoRT with Xeloda beginning on 09/26/18 by Dr Lisbeth Renshaw ending on 11/07/18.    09/26/2018 - 11/07/2018 Chemotherapy   ChemoRT with Xeloda beginning on 09/26/18, Xeloda 1500mg  q12h on days of radiation. Dose reduced to 1000 mg on 7/27 PM and 7/28 AM; held Xeloda on 10/25/18 due to fatigue, chills, and UTI. Restarted on 10/31/18 at 1000mg  BID. Completed on 8/10.        CURRENT THERAPY:  Surveillance   INTERVAL HISTORY:  Megan Munoz is here for a follow up. She presents to the clinic alone. Her daughter was called to be included in visit. She has been off treatment for 2 months. She feels she is doing well and her daughter feels she is almost back to baseline and her weight has improve. She is eating well. She tried to ambulate without assistance and she fell. She has a  large bruise on her left leg and nose. She saw Dr Johney Maine and did not feel tumor but thickened scar tissue. He says they will wait and watch before considering surgery. She has a MRI in 3 months then every 6 months.  She notes having several BMs a day. This can be loose stool or she may need to strain. She does see blood clots in the toilet and blood on tissue when she wipes. She denies any  abdominal pain.     REVIEW OF SYSTEMS:   Constitutional: Denies fevers, chills or abnormal weight loss Eyes: Denies blurriness of vision Ears, nose, mouth, throat, and face: Denies mucositis or sore throat Respiratory: Denies cough, dyspnea or wheezes Cardiovascular: Denies palpitation, chest discomfort or lower extremity swelling Gastrointestinal:  Denies nausea, heartburn (+) inconsistent BMs Skin: Denies abnormal skin rashes Lymphatics: Denies new lymphadenopathy or easy bruising Neurological:Denies numbness, tingling or new weaknesses Behavioral/Psych: Mood is stable, no new changes  All other systems were reviewed with the patient and are negative.  MEDICAL HISTORY:  Past Medical History:  Diagnosis Date  . Anxiety   . Atherosclerosis of aortic arch [facility]) April 2017   Noted on CT chest, calcified aortic atherosclerosis at the arch.  . Atrial fibrillation, permanent ([place]) 03/2013   Previously on Atenolol for Rate - now on combination diltiazem and carvedilol for rate Control;  & Xarelto for AC. CHADS2VASC2 = 7.  . Cerebral infarction [facility]) 10/2008   MRI Brain 05/2014 for near syncope revealed: No acure infarct.  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   Mild small vessel disease type changes.  No intracranial hemorrhage.  Global atrophy.   . CHF (congestive heart failure) ([place])   . COPD (chronic obstructive pulmonary disease) ([place])   . Coronary atherosclerosis due to calcified coronary lesion April 2017   Noted on CT chest  . Dementia ([place])    following 2010 stroke  . Hyperlipemia   . Hypertension   . Pneumonia   . PONV (postoperative nausea and vomiting)   . Stroke [facility])    2010- patient has dementia related to stroke   . Tinea unguium   . Tremor   . Tuberculosis     SURGICAL HISTORY: Past Surgical History:  Procedure Laterality Date  . BIOPSY  06/01/2018   Procedure:  BIOPSY;  Surgeon: Ronnette Juniper, MD;  Location: [facility] ENDOSCOPY;  Service: Gastroenterology;;  . BLADDER SURGERY    . CARPAL TUNNEL RELEASE Left   . COLONOSCOPY N/A 06/01/2018   Procedure: COLONOSCOPY;  Surgeon: Ronnette Juniper, MD;  Location: [facility]OSCOPY;  Service: Gastroenterology;  Laterality: N/A;  . ESOPHAGOGASTRODUODENOSCOPY (EGD) WITH PROPOFOL N/A 02/04/2018   Procedure: ESOPHAGOGASTRODUODENOSCOPY (EGD) WITH PROPOFOL;  Surgeon: Ronnette Juniper, MD;  Location: [facility];  Service: Gastroenterology;  Laterality: N/A;  . FOOT SURGERY Right   . HERNIA REPAIR    . NASAL SINUS SURGERY    . POLYPECTOMY  06/01/2018   Procedure: POLYPECTOMY;  Surgeon: Ronnette Juniper, MD;  Location: [facility]OS[facility];  Service: Gastroenterology;;  . TOTAL ABDOMINAL HYSTERECTOMY      I have reviewed the social history and family history with the patient and they are unchanged from previous note.  ALLERGIES:  is allergic to tetanus toxoids.  MEDICATIONS:  Current Outpatient Medications  Medication Sig Dispense Refill  . albuterol (PROVENTIL) (2.5 MG/3ML) 0.083% nebulizer solution Take 3 mLs (2.5 mg total) by nebulization every 6 (six) hours  as needed for wheezing or shortness of breath. 30 vial 1  . Azelastine HCl 137 MCG/SPRAY SOLN Place 2 sprays into both nostrils 2 (two) times daily. Use in each nostril as directed     . B Complex Vitamins (VITAMIN B COMPLEX PO) Take 1 tablet by mouth daily.     Marland Kitchen BISACODYL 5 MG EC tablet TK AS DIRECTED    . calcitonin, salmon, (MIACALCIN) 200 UNIT/ACT nasal spray Place 1 spray into alternate nostrils daily.    . capecitabine (XELODA) 500 MG tablet Take 3 tablets (1,500 mg total) by mouth 2 (two) times daily after a meal. Take on days of radiation, Monday through Friday 168 tablet 0  . Cholecalciferol (VITAMIN D3 SUPER STRENGTH) 2000 units CAPS Take 2,000 Units by mouth daily.     Marland Kitchen diltiazem (CARDIZEM CD) 120 MG 24 hr capsule TAKE 3 CAPSULES DAILY (PLEASE SCHEDULE AN APPOINTMENT FOR FURTHER  REFILLS) (Patient taking differently: Take 360 mg by mouth daily. ) 360 capsule 4  . docusate sodium (COLACE) 100 MG capsule Take 100-200 mg by mouth 2 (two) times daily as needed for mild constipation. Pt does take at least 100 mg daily    . donepezil (ARICEPT) 10 MG tablet Take 1 tablet (10 mg total) by mouth at bedtime. 90 tablet 3  . feeding supplement, ENSURE ENLIVE, (ENSURE ENLIVE) LIQD Take 237 mLs by mouth 3 (three) times daily between meals. (Patient taking differently: Take 237 mLs by mouth See admin instructions. 2-3  Times daily) 237 mL 12  . ferrous sulfate 325 (65 FE) MG tablet Take 1 tablet (325 mg total) by mouth every other day. 30 tablet 0  . FLUoxetine (PROZAC) 40 MG capsule Take 40 mg by mouth daily.     . Fluticasone-Umeclidin-Vilant (TRELEGY ELLIPTA) 100-62.5-25 MCG/INH AEPB Inhale 1 puff into the lungs daily.    . furosemide (LASIX) 20 MG tablet Take 1 tablet (20 mg total) by mouth daily as needed for fluid or edema. (Patient taking differently: Take 20 mg by mouth every other day. ) 30 tablet 6  . loratadine (CLARITIN) 10 MG tablet Take 10 mg by mouth daily.    . Melatonin 10 MG TABS Take 10 mg by mouth at bedtime.     . memantine (NAMENDA) 10 MG tablet Take 1 tablet (10 mg total) by mouth 2 (two) times daily. 180 tablet 3  . Multiple Vitamin (MULTIVITAMIN) capsule Take 1 capsule by mouth daily.    . ondansetron (ZOFRAN) 8 MG tablet TAKE 0.5-1 TABLET (4-8 MG TOTAL) BY MOUTH EVERY 8 (EIGHT) HOURS AS NEEDED FOR NAUSEA OR VOMITING. 30 tablet 0  . potassium chloride SA (K-DUR) 20 MEQ tablet Take 1 tablet (20 mEq total) by mouth daily. 30 tablet 0  . rivaroxaban (XARELTO) 10 MG TABS tablet Take 1 tablet (10 mg total) by mouth daily. 30 tablet 1  . silver sulfADIAZINE (SILVADENE) 1 % cream Apply 1 application topically 2 (two) times daily. 50 g 0  . simvastatin (ZOCOR) 40 MG tablet Take 40 mg by mouth daily.    Marland Kitchen sulfamethoxazole-trimethoprim (BACTRIM DS) 800-160 MG tablet Take 1  tablet by mouth 2 (two) times daily. 6 tablet 0   No current facility-administered medications for this visit.     PHYSICAL EXAMINATION: ECOG PERFORMANCE STATUS: 3 - Symptomatic, >50% confined to bed  Vitals:   01/12/19 1116 01/12/19 1120  BP: (!) 124/43 (!) 112/44  Pulse: 78   Resp: (!) 24   Temp: 98.7 F (37.1 C)  SpO2: 99%    Filed Weights   01/12/19 1116  Weight: 140 lb 4.8 oz (63.6 kg)    GENERAL:alert, no distress and comfortable SKIN: skin color, texture, turgor are normal, no rashes or significant lesions (+) Moderate bruise of left leg and bridge of nose EYES: normal, Conjunctiva are pink and non-injected, sclera clear  NECK: supple, thyroid normal size, non-tender, without nodularity LYMPH:  no palpable lymphadenopathy in the cervical, axillary  LUNGS: clear to auscultation and percussion with normal breathing effort HEART: regular rate & rhythm and no murmurs and no lower extremity edema ABDOMEN:abdomen soft, non-tender and normal bowel sounds Musculoskeletal:no cyanosis of digits and no clubbing  NEURO: alert & oriented x 3 with fluent speech, no focal motor/sensory deficits RECTAL: (+) Palpable rectal soft mass at 2:00 position, 4cm from anal verge, smooth, likely stool. No mass at anus. No blood on glove.    LABORATORY DATA:  I have reviewed the data as listed CBC Latest Ref Rng & Units 01/12/2019 12/12/2018 11/07/2018  WBC 4.0 - 10.5 K/uL 3.2(L) 3.8(L) 5.4  Hemoglobin 12.0 - 15.0 g/dL 11.7(L) 11.8(L) 11.0(L)  Hematocrit 36.0 - 46.0 % 36.5 37.5 33.9(L)  Platelets 150 - 400 K/uL 191 187 202     CMP Latest Ref Rng & Units 01/12/2019 12/12/2018 11/07/2018  Glucose 70 - 99 mg/dL 80 113(H) 100(H)  BUN 8 - 23 mg/dL 18 12 13   Creatinine 0.44 - 1.00 mg/dL 0.73 0.69 0.71  Sodium 135 - 145 mmol/L 142 142 137  Potassium 3.5 - 5.1 mmol/L 3.9 3.8 3.7  Chloride 98 - 111 mmol/L 104 107 102  CO2 22 - 32 mmol/L 29 29 25   Calcium 8.9 - 10.3 mg/dL 9.4 8.9 8.8(L)  Total  Protein 6.5 - 8.1 g/dL 7.3 6.8 5.8(L)  Total Bilirubin 0.3 - 1.2 mg/dL 0.6 0.6 0.7  Alkaline Phos 38 - 126 U/L 97 85 74  AST 15 - 41 U/L 23 23 34  ALT 0 - 44 U/L 13 11 26       RADIOGRAPHIC STUDIES: I have personally reviewed the radiological images as listed and agreed with the findings in the report. No results found.   ASSESSMENT & PLAN:  Megan Munoz is a 85 y.o. female with   1.Distalrenaladenocarcinoma, cT3N0M0 stage IIA -She was diagnosed in 05/2018.Distal rectal mass is large, biopsy showed tubulovillous adenoma with at least high-grade dysplasia, and they arehighly suspicious but not definitive invasive adenocarcinoma.Her pelvic MRI showed a distal rectal mass,clinical T3 lesion, no abnormal nodes.  -Based on the above, and her advanced age, I do not think she needs further biopsy, this is most consistent rectal cancer. Her CT chest was negative for metastasis.  -she completed concurrent ChemoRT with Xeloda1500mg  q12h on days of radiationon 09/26/18, followed by surgery.She completed on 11/07/18.  -She saw Dr. Johney Maine who has her on surveillance given her good response to treatment. His rectal exam indicated no clinical residual disease  -Since she completed treatment she is nearly back to baseline, her eating is good and she has gained weight back. She does have inconsistent bowel movements with mild bleeding. CBC and CMP WNL except WBC 3.2, Hg 11.7. CEA is still pending. Rectal exam today shows no anal mass, and likely stool at 2:00 position of rectum.   -She had a recent fall while trying to ambulate without assistance. I encouraged her to use Walker.  -Next MRI scan in December or January. Continue regular exams, labs and colonoscopy in 1 year.  -f/u in  5 months. She will f/u with Dr Johney Maine in interim after MRI  -will order CT CAP on next visit     2.HTN andCongestive heart failure  -On Cardizem, Lasix 20mg  daily   3. History of stroke and dementia -On Xarelto 10mg   daily -On Aricept 10mg  once daily and Namenda 10mg  twice daily -pt is able to do all ADLs, lives with her daughter and son-in-law, has good support -Followed in neurology by Dr. Krista Blue   4. Depression and anxiety -On Prozac 40mg  daily  5.Fatigue, dyspnea, tachypnea, chillswithout fever on 10/25/18  -Xeloda held 7/28-8/3 for above symptoms -she received 500 cc NS on 7/28  -she was found to have UTIwith labs last week. Blood culture and chest xray negative -She completed antibiotics with bactrim. -She has improved appetite eating solid food and more energy, afebrile. She does all her personal care herself. Rectal pain and GI bleeding is minimal.}   PLAN: -Flu shot today  -Lab and f/u in 5 months  -pelvic MRI and f/u with Dr. Johney Maine in Dec  -I called her daughter Maudie Mercury during her visit    No problem-specific Assessment & Plan notes found for this encounter.   No orders of the defined types were placed in this encounter.  All questions were answered. The patient knows to call the clinic with any problems, questions or concerns. No barriers to learning was detected. I spent 20 minutes counseling the patient face to face. The total time spent in the appointment was 25 minutes and more than 50% was on counseling and review of test results     Truitt Merle, MD 01/12/2019   I, Joslyn Devon, am acting as scribe for Truitt Merle, MD.   I have reviewed the above documentation for accuracy and completeness, and I agree with the above.

## 2019-01-10 ENCOUNTER — Telehealth: Payer: Self-pay | Admitting: *Deleted

## 2019-01-10 NOTE — Telephone Encounter (Signed)
Left message for daughter to call back regarding f/u with Dr. Johney Maine.  Daughter, Maudie Mercury called back & reports that they did f/u with Dr Johney Maine & no changes made.  Observation for now & he thought what he was feeling was scar tissue but will repeat MRI in @ 2 mo.

## 2019-01-10 NOTE — Telephone Encounter (Signed)
-----   Message from Alla Feeling, NP sent at 01/10/2019  3:17 PM EDT ----- Please call the daughter and see if they have been able to schedule f/u with Dr. Johney Maine. Thanks, Regan Rakers

## 2019-01-12 ENCOUNTER — Inpatient Hospital Stay (HOSPITAL_BASED_OUTPATIENT_CLINIC_OR_DEPARTMENT_OTHER): Payer: Medicare Other | Admitting: Hematology

## 2019-01-12 ENCOUNTER — Other Ambulatory Visit: Payer: Self-pay

## 2019-01-12 ENCOUNTER — Inpatient Hospital Stay: Payer: Medicare Other | Attending: Nurse Practitioner

## 2019-01-12 ENCOUNTER — Encounter: Payer: Self-pay | Admitting: Hematology

## 2019-01-12 ENCOUNTER — Telehealth: Payer: Self-pay | Admitting: Hematology

## 2019-01-12 VITALS — BP 112/44 | HR 78 | Temp 98.7°F | Resp 24 | Ht 67.0 in | Wt 140.3 lb

## 2019-01-12 DIAGNOSIS — I10 Essential (primary) hypertension: Secondary | ICD-10-CM

## 2019-01-12 DIAGNOSIS — C20 Malignant neoplasm of rectum: Secondary | ICD-10-CM

## 2019-01-12 DIAGNOSIS — Z23 Encounter for immunization: Secondary | ICD-10-CM | POA: Diagnosis not present

## 2019-01-12 DIAGNOSIS — I4821 Permanent atrial fibrillation: Secondary | ICD-10-CM | POA: Diagnosis not present

## 2019-01-12 LAB — CBC WITH DIFFERENTIAL (CANCER CENTER ONLY)
Abs Immature Granulocytes: 0.01 10*3/uL (ref 0.00–0.07)
Basophils Absolute: 0 10*3/uL (ref 0.0–0.1)
Basophils Relative: 0 %
Eosinophils Absolute: 0 10*3/uL (ref 0.0–0.5)
Eosinophils Relative: 0 %
HCT: 36.5 % (ref 36.0–46.0)
Hemoglobin: 11.7 g/dL — ABNORMAL LOW (ref 12.0–15.0)
Immature Granulocytes: 0 %
Lymphocytes Relative: 28 %
Lymphs Abs: 0.9 10*3/uL (ref 0.7–4.0)
MCH: 32.7 pg (ref 26.0–34.0)
MCHC: 32.1 g/dL (ref 30.0–36.0)
MCV: 102 fL — ABNORMAL HIGH (ref 80.0–100.0)
Monocytes Absolute: 0.3 10*3/uL (ref 0.1–1.0)
Monocytes Relative: 8 %
Neutro Abs: 2 10*3/uL (ref 1.7–7.7)
Neutrophils Relative %: 64 %
Platelet Count: 191 10*3/uL (ref 150–400)
RBC: 3.58 MIL/uL — ABNORMAL LOW (ref 3.87–5.11)
RDW: 13.9 % (ref 11.5–15.5)
WBC Count: 3.2 10*3/uL — ABNORMAL LOW (ref 4.0–10.5)
nRBC: 0 % (ref 0.0–0.2)

## 2019-01-12 LAB — CMP (CANCER CENTER ONLY)
ALT: 13 U/L (ref 0–44)
AST: 23 U/L (ref 15–41)
Albumin: 3.9 g/dL (ref 3.5–5.0)
Alkaline Phosphatase: 97 U/L (ref 38–126)
Anion gap: 9 (ref 5–15)
BUN: 18 mg/dL (ref 8–23)
CO2: 29 mmol/L (ref 22–32)
Calcium: 9.4 mg/dL (ref 8.9–10.3)
Chloride: 104 mmol/L (ref 98–111)
Creatinine: 0.73 mg/dL (ref 0.44–1.00)
GFR, Est AFR Am: >60 mL/min (ref >60)
GFR, Estimated: >60 mL/min (ref >60)
Glucose, Bld: 80 mg/dL (ref 70–99)
Potassium: 3.9 mmol/L (ref 3.5–5.1)
Sodium: 142 mmol/L (ref 135–145)
Total Bilirubin: 0.6 mg/dL (ref 0.3–1.2)
Total Protein: 7.3 g/dL (ref 6.5–8.1)

## 2019-01-12 LAB — CEA (IN HOUSE-CHCC): CEA (CHCC-In House): 2.69 ng/mL (ref 0.00–5.00)

## 2019-01-12 MED ORDER — INFLUENZA VAC A&B SA ADJ QUAD 0.5 ML IM PRSY
0.5000 mL | PREFILLED_SYRINGE | Freq: Once | INTRAMUSCULAR | Status: AC
  Administered 2019-01-12: 0.5 mL via INTRAMUSCULAR

## 2019-01-12 MED ORDER — INFLUENZA VAC A&B SA ADJ QUAD 0.5 ML IM PRSY
PREFILLED_SYRINGE | INTRAMUSCULAR | Status: AC
  Filled 2019-01-12: qty 0.5

## 2019-01-12 NOTE — Telephone Encounter (Signed)
Scheduled appt per 10/15 los.  Spoke with patient daughter and she is aware of the appt date and time.

## 2019-01-13 ENCOUNTER — Telehealth: Payer: Self-pay | Admitting: *Deleted

## 2019-01-13 NOTE — Telephone Encounter (Signed)
LM with note below 

## 2019-01-13 NOTE — Telephone Encounter (Signed)
-----   Message from Alla Feeling, NP sent at 01/12/2019 10:41 PM EDT ----- Please let her know CEA is normal, no concerns. Thanks, Regan Rakers

## 2019-01-19 ENCOUNTER — Ambulatory Visit (INDEPENDENT_AMBULATORY_CARE_PROVIDER_SITE_OTHER): Payer: Medicare Other | Admitting: Cardiology

## 2019-01-19 ENCOUNTER — Other Ambulatory Visit: Payer: Self-pay

## 2019-01-19 ENCOUNTER — Encounter: Payer: Self-pay | Admitting: Cardiology

## 2019-01-19 VITALS — BP 128/62 | HR 76 | Ht 67.0 in | Wt 140.8 lb

## 2019-01-19 DIAGNOSIS — I272 Pulmonary hypertension, unspecified: Secondary | ICD-10-CM

## 2019-01-19 DIAGNOSIS — I5032 Chronic diastolic (congestive) heart failure: Secondary | ICD-10-CM | POA: Diagnosis not present

## 2019-01-19 DIAGNOSIS — I10 Essential (primary) hypertension: Secondary | ICD-10-CM | POA: Diagnosis not present

## 2019-01-19 DIAGNOSIS — I4821 Permanent atrial fibrillation: Secondary | ICD-10-CM | POA: Diagnosis not present

## 2019-01-19 DIAGNOSIS — E782 Mixed hyperlipidemia: Secondary | ICD-10-CM

## 2019-01-19 NOTE — Progress Notes (Signed)
PCP: Fanny Bien, MD  Clinic Note: Chief Complaint  Patient presents with  . Follow-up    Annual, but has had very complicated last year  . Atrial Fibrillation    Asymptomatic     HPI:   Megan Munoz is a 85 y.o. female with a h/o PAF who presents today for delayed annual f/u. She is accompanied today by her son-in-law but her daughter is on the telephone and majority this conversation was either with her son-in-law and/or her daughter via telephone.  This is a prolonged visit because of health and connectivity issues.  Megan Munoz was last seen last July 2019 -- dtr noted issues with memory & appetite loss.  Otherwise no real cardiac symptoms. -- had been on Xarelto & combo CCB/BB.  Recent Hospitalizations:   Admitted for pneumonia in November 2019.  This is a left upper lobe pneumonia but she was also noted to be anemic with hemoglobin of 7.2.  She was noted to have some evidence of pulmonary edema after blood transfusion and was given Lasix.  Echo was ordered revealing relatively normal EF of 50 to 55% with diffuse hypokinesis.  No significant findings found on EGD. ->  GI medicine was fine with restarting Xarelto.  She was discharged on 20 mg Lasix.  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  Additionally to 6 to 8 mm polyps noted in the transverse colon removed, 1 7 mm polyp in the ascending colon removed.  A 6 mm polyp in the cecum removed, 4 mm polyp at hepatic flexure removed and 9 mm polyp in descending colon removed.  Diverticulosis in the sigmoid colon noted.  September 21, 2018 she was diagnosed with colorectal cancer underwent chemotherapy and radiation therapy from June 29 August 3.:  Rectal cancer stage removal to a cT 3,cN 0,cM 0)  Plan is follow-up MRI roughly 3 months. ->  A visit with Dr. Johney Maine, exam felt to be thickened scar tissue and not tumor.  Plan to watch and wait prior to  considering surgery..  During all this, her Xarelto dose was decreased to 10 mg daily to avoid bleeding issues.  June 13, 2018-ER visit for COPD exacerbation  Reviewed  CV studies:   The following studies were reviewed today: (if available, images/films reviewed: From Epic Chart or Care Everywhere)  Transthoracic echo November 2019:  EF 50 to 55%.  Diffuse HK.  No evidence of elevated filling pressures noted.  Moderate MR.  Moderate LA dilation.  Moderate RV dilation and severe RA dilation.  Moderate TR.  Elevated PA pressures.  (Estimated PA P peak 49 mmHg)  Interval History:   Megan Munoz (pronounced "Kays") returns here today really pretty stable overall from a cardiac standpoint.  They noted that her carvedilol has been stopped back in November when she had a pneumonia.  Has been on diltiazem which she usually takes 2 of the 120 mg tablets daily.  She is currently taking her 20 mg Lasix despite every other day. Has not had any further GI bleeding but is on a reduced dose of Xarelto 10 mg daily.  She definitely has had some issues with very unsteady gait poor balance.  Uses a walker and if she does not she is likely to fall.  She actually did bruise herself with a fall.  She definitely was really weak and tired/lethargic while she was getting her chemotherapy and radiation, but  did not really have any recurrent symptoms of pneumonia or significant weight loss.  Really the major issue is orthostatic dizziness, unstable gait.  She has not actually lost consciousness but has had some falls (not recently).  Some bruising, but no significant bleeding.  Cardiovascular review of symptoms:  positive for - Dizziness with poor balance lightheadedness. negative for - chest pain, dyspnea on exertion, orthopnea, palpitations, paroxysmal nocturnal dyspnea, rapid heart rate, shortness of breath or True syncope/near syncope or TIA/amaurosis fugax.  Does not walk enough of claudication.  The patient does  not have symptoms concerning for COVID-19 infection (fever, chills, cough, or new shortness of breath).  The patient is practicing social distancing. ++ Masking.  She does not have to go out much for anything like groceries/shopping-everything is taken care of by her daughter and son-in-law.   REVIEWED OF SYSTEMS   ROS: A comprehensive was performed. Review of Systems  Constitutional: Positive for malaise/fatigue. Negative for weight loss (Has put back on weight that she lost).  HENT: Negative for congestion and nosebleeds.   Respiratory: Positive for cough (Off and on, but nonproductive.). Negative for shortness of breath.   Gastrointestinal: Negative for abdominal pain (Improving now that she is no longer on chemo meds.), blood in stool, constipation and melena.       No longer really having any GI upset or nausea/vomiting since stopping her radiation and chemo.  Genitourinary: Negative for hematuria.  Musculoskeletal: Positive for falls (No recent falls, but does have a very unsteady gait if not using walker.  Somewhat weak.). Negative for back pain, joint pain and myalgias.  Neurological: Positive for dizziness (If she stands up fast, if she starts walking too quickly she loses her balance.), tremors (She does have some tremor) and weakness (Generalized). Negative for focal weakness and headaches.  Psychiatric/Behavioral: Positive for memory loss. Negative for depression and hallucinations. The patient is not nervous/anxious and does not have insomnia.    I have reviewed and (if needed) personally updated the patient's problem list, medications, allergies, past medical and surgical history, social and family history.   PAST MEDICAL HISTORY   Past Medical History:  Diagnosis Date  . Anxiety   . Atherosclerosis of aortic arch [facility]) April 2017   Noted on CT chest, calcified aortic atherosclerosis at the arch.  . Atrial fibrillation, permanent ([place]) 03/2013   Previously on Atenolol for  Rate - now on combination diltiazem and carvedilol for rate Control;  & Xarelto for AC. CHADS2VASC2 = 7.  . Cerebral infarction [facility]) 10/2008   MRI Brain 05/2014 for near syncope revealed: No acure infarct.  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   Mild small vessel disease type changes.  No intracranial hemorrhage.  Global atrophy.   Marland Kitchen COPD (chronic obstructive pulmonary disease) ([place])   . Coronary atherosclerosis due to calcified coronary lesion April 2017   Noted on CT chest  . Dementia ([place])    following 2010 stroke  . Diastolic dysfunction with chronic heart failure (HCC)    Exacerbated by A. fib RVR  . Hyperlipemia   . Hypertension   . Pneumonia   . PONV (postoperative nausea and vomiting)   . Stroke [facility])    2010- patient has dementia related to stroke   . Tinea unguium   . Tremor   . Tuberculosis      PAST SURGICAL HISTORY   Past Surgical History:  Procedure  Laterality Date  . BIOPSY  06/01/2018   Procedure: BIOPSY;  Surgeon: Ronnette Juniper, MD;  Location: [facility];  Service: Gastroenterology;;  . BLADDER SURGERY    . CARPAL TUNNEL RELEASE Left   . COLONOSCOPY N/A 06/01/2018   Procedure: COLONOSCOPY;  Surgeon: Ronnette Juniper, MD;  Location: [facility];  Service: Gastroenterology;  Laterality: N/A;  . ESOPHAGOGASTRODUODENOSCOPY (EGD) WITH PROPOFOL N/A 02/04/2018   Procedure: ESOPHAGOGASTRODUODENOSCOPY (EGD) WITH PROPOFOL;  Surgeon: Ronnette Juniper, MD;  Location: [facility];  Service: Gastroenterology;  Laterality: N/A;  . FOOT SURGERY Right   . HERNIA REPAIR    . NASAL SINUS SURGERY    . POLYPECTOMY  06/01/2018   Procedure: POLYPECTOMY;  Surgeon: Ronnette Juniper, MD;  Location: [facility];  Service: Gastroenterology;;  . TOTAL ABDOMINAL HYSTERECTOMY       MEDICATIONS/ALLERGIES   Current Meds  Medication Sig  . albuterol (PROVENTIL) (2.5 MG/3ML) 0.083% nebulizer solution Take 3  mLs (2.5 mg total) by nebulization every 6 (six) hours as needed for wheezing or shortness of breath.  . Azelastine HCl 137 MCG/SPRAY SOLN Place 2 sprays into both nostrils 2 (two) times daily. Use in each nostril as directed   . B Complex Vitamins (VITAMIN B COMPLEX PO) Take 1 tablet by mouth daily.   Marland Kitchen BISACODYL 5 MG EC tablet TK AS DIRECTED  . calcitonin, salmon, (MIACALCIN) 200 UNIT/ACT nasal spray Place 1 spray into alternate nostrils daily.  . capecitabine (XELODA) 500 MG tablet Take 3 tablets (1,500 mg total) by mouth 2 (two) times daily after a meal. Take on days of radiation, Monday through Friday  . Cholecalciferol (VITAMIN D3 SUPER STRENGTH) 2000 units CAPS Take 2,000 Units by mouth daily.   Marland Kitchen diltiazem (CARDIZEM) 120 MG tablet Take 240 mg by mouth daily.  Marland Kitchen docusate sodium (COLACE) 100 MG capsule Take 100-200 mg by mouth 2 (two) times daily as needed for mild constipation. Pt does take at least 100 mg daily  . donepezil (ARICEPT) 10 MG tablet Take 1 tablet (10 mg total) by mouth at bedtime.  . feeding supplement, ENSURE ENLIVE, (ENSURE ENLIVE) LIQD Take 237 mLs by mouth 3 (three) times daily between meals. (Patient taking differently: Take 237 mLs by mouth See admin instructions. 2-3  Times daily)  . ferrous sulfate 325 (65 FE) MG tablet Take 1 tablet (325 mg total) by mouth every other day.  Marland Kitchen FLUoxetine (PROZAC) 40 MG capsule Take 40 mg by mouth daily.   . Fluticasone-Umeclidin-Vilant (TRELEGY ELLIPTA) 100-62.5-25 MCG/INH AEPB Inhale 1 puff into the lungs daily.  . furosemide (LASIX) 20 MG tablet Take 1 tablet (20 mg total) by mouth daily as needed for fluid or edema. (Patient taking differently: Take 20 mg by mouth every other day. )  . loratadine (CLARITIN) 10 MG tablet Take 10 mg by mouth daily.  . Melatonin 10 MG TABS Take 10 mg by mouth at bedtime.   . memantine (NAMENDA) 10 MG tablet Take 1 tablet (10 mg total) by mouth 2 (two) times daily.  . Multiple Vitamin (MULTIVITAMIN)  capsule Take 1 capsule by mouth daily.  . ondansetron (ZOFRAN) 8 MG tablet TAKE 0.5-1 TABLET (4-8 MG TOTAL) BY MOUTH EVERY 8 (EIGHT) HOURS AS NEEDED FOR NAUSEA OR VOMITING.  . potassium chloride SA (K-DUR) 20 MEQ tablet Take 1 tablet (20 mEq total) by mouth daily.  . rivaroxaban (XARELTO) 10 MG TABS tablet Take 1 tablet (10 mg total) by mouth daily.  . silver sulfADIAZINE (SILVADENE) 1 % cream Apply 1 application  topically 2 (two) times daily.  . simvastatin (ZOCOR) 40 MG tablet Take 40 mg by mouth daily.  Marland Kitchen sulfamethoxazole-trimethoprim (BACTRIM DS) 800-160 MG tablet Take 1 tablet by mouth 2 (two) times daily.  . [DISCONTINUED] diltiazem (CARDIZEM CD) 120 MG 24 hr capsule TAKE 3 CAPSULES DAILY (PLEASE SCHEDULE AN APPOINTMENT FOR FURTHER REFILLS) (Patient taking differently: Take 160 mg by mouth daily. )    Allergies  Allergen Reactions  . Tetanus Toxoids Other (See Comments)    Told never to take     SOCIAL HISTORY/FAMILY HISTORY   Social History   Tobacco Use  . Smoking status: Former Smoker    Packs/day: 1.00    Years: 16.00    Pack years: 16.00    Start date: 03/30/1954    Quit date: 03/30/1970    Years since quitting: 48.8  . Smokeless tobacco: Never Used  . Tobacco comment: smoked 1/2-1 ppd  Substance Use Topics  . Alcohol use: Not Currently    Alcohol/week: 0.0 standard drinks  . Drug use: No   Social History   Social History Narrative   Widow.  Lives with her daughter & her family.  (Son-in-law & 5 Grandsons).   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   2-4 cups caffeine/day   1-2 glassess of wine/beer  Per week.  Never smoked.      [facility]:   Originally from [place]. She lived in [place], [place], [place], & [place]. 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 No bird exposure. No mold or hot tub exposure.     family history includes Asthma in her daughter; Cancer in her maternal grandmother; Emphysema in her father  and paternal grandmother; Lung disease in her father; Osteoporosis in her mother; Stroke in her brother.   OBJCTIVE -PE, EKG, labs   Wt Readings from Last 3 Encounters:  01/19/19 140 lb 12.8 oz (63.9 kg)  01/12/19 140 lb 4.8 oz (63.6 kg)  12/12/18 129 lb 4.8 oz (58.7 kg)    Physical Exam: BP 128/62   Pulse 76   Ht 5\' 7"  (1.702 m)   Wt 140 lb 12.8 oz (63.9 kg)   BMI 22.05 kg/m  Physical Exam  Constitutional: She is oriented to person, place, and time. No distress.  Thin, frail elderly woman.  Actually seems to have put on weight in the last month or so compared to the chart.  Not cachectic.  Nontoxic.  Well-groomed  HENT:  Head: Normocephalic and atraumatic.  Neck: Normal range of motion. Neck supple. No JVD present. Carotid bruit is not present.  Cardiovascular: Normal rate. An irregularly irregular rhythm present.  No extrasystoles are present. PMI is not displaced. Exam reveals distant heart sounds and decreased pulses (Diminished pedal pulses bilaterally but palpable.). Exam reveals no gallop and no friction rub.  No murmur heard. Pulmonary/Chest: Effort normal and breath sounds normal. No respiratory distress. She has no wheezes. She has no rales.  Abdominal: Soft. Bowel sounds are normal. She exhibits no distension. There is no abdominal tenderness.  No HSM -> I did not palpate firmly in the lower quadrants.  Musculoskeletal: Normal range of motion.        General: No edema.  Neurological: She is alert and oriented to person, place, and time.  Skin: Skin is warm and dry.  Psychiatric:  Answer some questions, but defers to either son-in-law in person or daughter on the telephone.  Relatively poor memory and/poor historian.  Somewhat flat/blunted affect  Vitals reviewed.   Adult ECG Report  Rate: 76 ;  Rhythm: atrial fibrillation and Plan exclude septal MI, age undetermined.  Otherwise normal axis, intervals and durations.;   Narrative Interpretation: Stable EKG  Recent  Labs:   CBC Latest Ref Rng & Units 01/12/2019 12/12/2018 11/07/2018  WBC 4.0 - 10.5 K/uL 3.2(L) 3.8(L) 5.4  Hemoglobin 12.0 - 15.0 g/dL 11.7(L) 11.8(L) 11.0(L)  Hematocrit 36.0 - 46.0 % 36.5 37.5 33.9(L)  Platelets 150 - 400 K/uL 191 187 202   Lab Results  Component Value Date   CREATININE 0.73 01/12/2019   BUN 18 01/12/2019   NA 142 01/12/2019   K 3.9 01/12/2019   CL 104 01/12/2019   CO2 29 01/12/2019   No results found for: CHOL, HDL, LDLCALC, LDLDIRECT, TRIG, CHOLHDL   ASSESSMENT/PLAN    Problem List Items Addressed This Visit    Permanent atrial fibrillation ([place]): CHA2DS2-VASc Score 7 - on Xarelto - Primary (Chronic)   Relevant Medications   diltiazem (CARDIZEM) 120 MG tablet   Other Relevant Orders   EKG 12-Lead (Completed)   Essential hypertension (Chronic)   Relevant Medications   diltiazem (CARDIZEM) 120 MG tablet   Pulmonary hypertension (HCC)    Elevated pulmonary pressures noted on echocardiogram in the setting of a pneumonia exacerbation.  I would suspect that this level of dilated RV pressures etc. will be related to COPD.  Not likely related to left-sided heart failure.      Relevant Medications   diltiazem (CARDIZEM) 120 MG tablet       COVID-19 Education: The signs and symptoms of COVID-19 were discussed with the patient and how to seek care for testing (follow up with PCP or arrange E-visit).   The importance of social distancing was discussed today.  I spent a total of 30 minutes with the patient and chart review. >  50% of the time was spent in direct patient consultation.  Extensive time was spent due to much of the communication being via telephone to her daughter while she and her son-in-law were there.  Sometimes questions had to be repeated etc. Additional time spent with chart review (studies, outside notes, etc): 15 --extensive chart review with hospitalizations, colonoscopies treatment regimen for rectal cancer etc. Total Time: 40 min    Current medicines are reviewed at length with the patient today.  (+/- concerns) none   Patient Instructions / Medication Changes & Studies & Tests Ordered   Patient Instructions  Medication Instructions:   CONTINUE WITH CURRENT MEDICATIONS  CONTINUE TO TAKE DILTIAZEM XT( LONG ACTING)  240 MG  ( 2 TABLETS ) DAILY , IF  DIZZY OR BLOOD PRESSURE   IS LOW MAY TAKE 120 MG ONLY  THAT DAY *If you need a refill on your cardiac medications before your next appointment, please call your pharmacy*  Lab Work: NOT NEEDED    Testing/Procedures: NOT NEEDED  Follow-Up: At [facility], you and your health needs are our priority.  As part of our continuing mission to provide you with exceptional heart care, we have created designated Provider Care Teams.  These Care Teams include your primary Cardiologist (physician) and Advanced Practice Providers (APPs -  Physician Assistants and Nurse Practitioners) who all work together to provide you with the care you need, when you need it.  Your next appointment:   8 months- June /July 2021  The format for your next appointment:   In Person  Provider:   Glenetta Hew, MD  Other Instructions     Studies Ordered:   Orders Placed This Encounter  Procedures  . EKG 12-Lead     Glenetta Hew, M.D., M.S. Interventional Cardiologist   Pager # [phone] Phone # [phone] [address] [place], [place] [address]   Thank you for choosing Heart[facility] at [facility]!!

## 2019-01-19 NOTE — Patient Instructions (Addendum)
Medication Instructions:   CONTINUE WITH CURRENT MEDICATIONS  CONTINUE TO TAKE DILTIAZEM XT( LONG ACTING)  240 MG  ( 2 TABLETS ) DAILY , IF  DIZZY OR BLOOD PRESSURE   IS LOW MAY TAKE 120 MG ONLY  THAT DAY *If you need a refill on your cardiac medications before your next appointment, please call your pharmacy*  Lab Work: NOT NEEDED    Testing/Procedures: NOT NEEDED  Follow-Up: At [facility], you and your health needs are our priority.  As part of our continuing mission to provide you with exceptional heart care, we have created designated Provider Care Teams.  These Care Teams include your primary Cardiologist (physician) and Advanced Practice Providers (APPs -  Physician Assistants and Nurse Practitioners) who all work together to provide you with the care you need, when you need it.  Your next appointment:   8 months- June /July 2021  The format for your next appointment:   In Person  Provider:   Glenetta Hew, MD  Other Instructions

## 2019-01-24 ENCOUNTER — Telehealth: Payer: Medicare Other | Admitting: Neurology

## 2019-01-24 ENCOUNTER — Encounter: Payer: Self-pay | Admitting: Cardiology

## 2019-01-24 DIAGNOSIS — F0390 Unspecified dementia without behavioral disturbance: Secondary | ICD-10-CM | POA: Diagnosis not present

## 2019-01-24 DIAGNOSIS — F419 Anxiety disorder, unspecified: Secondary | ICD-10-CM | POA: Diagnosis not present

## 2019-01-24 DIAGNOSIS — I10 Essential (primary) hypertension: Secondary | ICD-10-CM | POA: Diagnosis not present

## 2019-01-24 DIAGNOSIS — J449 Chronic obstructive pulmonary disease, unspecified: Secondary | ICD-10-CM | POA: Diagnosis not present

## 2019-01-24 DIAGNOSIS — I4891 Unspecified atrial fibrillation: Secondary | ICD-10-CM | POA: Diagnosis not present

## 2019-01-24 NOTE — Assessment & Plan Note (Addendum)
Elevated pulmonary pressures noted on echocardiogram in the setting of a pneumonia exacerbation.  I would suspect that this level of dilated RV pressures etc. will be related to COPD.  Not likely related to left-sided heart failure.  No oxygen requirement.  Is currently on diltiazem.  As most likely pressures from echocardiogram overestimate true measured pressures, would not do more at this point since she is relatively asymptomatic.

## 2019-01-24 NOTE — Assessment & Plan Note (Signed)
Pretty euvolemic.  She takes Lasix every other day simply continue taking it as needed additionally if necessary.  Would not want to be much more aggressive as this probably better to have a little edema then have her dehydrated with her balance issues.

## 2019-01-24 NOTE — Assessment & Plan Note (Signed)
Blood pressure looks good off of both beta-blocker and ACE inhibitor.  Is only on diltiazem.  I will try to err on the side of permissive hypertension therefore would not add any additional meds.

## 2019-01-24 NOTE — Assessment & Plan Note (Signed)
She remains on simvastatin.  This point I think we really could probably even suggest that this may be reasonable to stop if there is concern for worsening dementia.  Question the long-term benefit at this point.

## 2019-01-24 NOTE — Assessment & Plan Note (Signed)
Premeds per minute chronic A. fib.  Basely totally asymptomatic.  Has been rate controlled initially with a combination of diltiazem and beta-blocker.  Beta-blocker was discontinued about a year ago.  Remains now on basely 240 mg diltiazem.    Recommendation will be to take an extra dose if she has increased heart rate or palpitations and potentially even take only 1 tab if she is more lightheaded and dizzy with normal heart rate.  The other issue is that she is now on 10 mg of Xarelto as opposed to 20.  Somewhat flawed logic in this process and that she essentially is not adequately protected is for stroke prevention with reduced dose of Xarelto.  There is a reduced risk of bleeding, but the effectiveness is lost.  For now until we are sure that her oncological issues are resolved, we will continue on lower dose, however we will need to readdress going back up to maintenance dose versus considering possibly converting to Eliquis at this concern of GI bleeding.

## 2019-01-24 NOTE — Progress Notes (Signed)
Virtual Visit via Video Note  I connected with Megan Munoz on 01/25/19 at 10:45 AM EDT by a video enabled telemedicine application and verified that I am speaking with the correct person using two identifiers.  Location: Patient: At her home Provider: In the office    I discussed the limitations of evaluation and management by telemedicine and the availability of in person appointments. The patient expressed understanding and agreed to proceed.  History of Present Illness: Megan Munoz a 85 yo RH , female, is accompanied by her daughter Megan Munoz, referred by her primary care physician Dr Ernie Hew for continued neurological care for her history of stroke, essential tremor,  She had a history of hypertension, hyperlipidemia, diagnosis of atrial fibrillation since 2015, is taking Xarelto  She suffered left frontal, basal ganglion stroke in 2010, presented with mild confusion, slow processing time, initially was treated with aspirin, since her diagnosis of atrial fibrillation in 2015, she was started on anticoagulation Xarelto  She moved in with her daughter recently, no longer driving, but independent in her daily activity, doing laundry, ambulate without assistance, mild memory trouble today's Mini-Mental Status Examination is 26 out of 30  She had long-standing history of gradual onset bilateral hands tremor, was diagnosed with essential tremor, acute worsening since her stroke in 2010, they have tried variable dose of primidone up to 750 mg daily, currently taking 250 mg twice a day, which works out best for her, she still has posturing tremor of her bilateral hands  UPDATE Dec 11 2014: She is with her daughter Megan Munoz at today's clinical visit, continue has mild memory trouble, unsteady gait,  I have reviewed MRI report from outside hospital February 2016 1. No MR evidence of an acute intracranial abnormality. 2. Sequela of previous left frontal infarct (with resultant cystic  encephalomalacia with surrounding gliosis). 3. Age-appropriate global cerebral volume loss and background microvascular disease. 4. No MR evidence of abnormal intracranial enhancement. 5. Bilateral maxillary sinus disease with small fluid levels suggesting an acute sinusitis.  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 She also has a history of essential tremor gait abnormality and insomnia. She also has a history of stress with minimal bruising and no bleeding. Since last seen she received some physical therapy for her gait however she is not doing a home exercise program. She denies any falls. Her primidone was stopped after her last visit and her tremor is very intermittent. It is not bothersome to her usually only bothers her during eating. She takes melatonin for her insomnia with good results. She continues to be very active does her own laundry, play solitaire and other stimulating activities.She returns for reevaluation  UPDATE July 26 2018 SS: [name] is an 85 year old female with history of  dementia likely central nervous system  degenerative disorder, essential tremor, gait abnormality, insomnia.  In April 2019 her MMSE was 26/30.  She is currently taking Aricept and Namenda.  In the past she has been on primidone.  Primidone was stopped about a year ago, because it was not very helpful for the tremors.  Since being off the medication, her and her daughter feel the tremors have gotten worse.  The tremors are mostly in her hands, right more so than left.  She used to notice them just when she was doing something, but now she will notice mild tremors in her hands when she is sitting resting.  Occasionally she will have tremors in her torso.  Her daughter reports back in [place] where they used to live they had inquired about a DBS, decided was not worth the risk.  In March 2020, she had a hospitalization for pneumonia, mild CHF, had to do some rehab.  She feels that her tremors have gotten worse, having to eat with her left hand.  She denies any weight loss, her appetite is fair, she does drink 2 ensures daily.  She reports continued trouble finding her words, knows what she wants to say but cannot get the words out.  She is currently living with her daughter, her daughter manages her medications.  She is to walk with a walker, denies any falls.  Update January 25, 2019 SS: [name] presents today for virtual visit accompanied by her daughter. They want to discuss her tremor today. She has been on primidone in the past, but wasn't very helpful. For the last year, more prominent tremor, slightly worse. The tremor is in her hands. Sometimes if she crosses her arms, it looks like her whole body is shaking. She uses a spoon for eating. The tremor is more prominent in her right hand. She uses a walker, goes out to get the paper. A few weeks ago she fell when bending down to get the paper, after she had walked around the cul-de-sac. She remains on Aricept, Namenda. Memory is stable. In May, diagnosed with rectal cancer, did radiation, chemo up  until August. Her weight had dropped after therapy, now back up.   Observations/Objective: Virtual visit, is alert and oriented, sitting on the couch with her daughter, speech is clear and concise, follows commands, facial symmetry noted, mild tremor noted to right hand when outstretched, gait is intact, with turns, tendency to lean to the left, is wearing a life alert necklace  MOCA-Blind 16/22, disoriented to date  Assessment and Plan: 1. Memory disturbance  -Memory appears to be stable -Continue Aricept and Namenda  2. Essential tremor -Has been off primidone -She wants to try something for tremor, bothersome with eating, may try Topamax 50 mg at bedtime x 2 weeks, then try 50 mg twice a day -Follow-up in 6 months or sooner if needed  Follow Up Instructions: 6 months 08/01/2019 11:15   I discussed the assessment and treatment plan with the patient. The patient was provided an opportunity to ask questions and all were answered. The patient agreed with the plan and demonstrated an understanding of the instructions.   The patient was advised to call back or seek an in-person evaluation if the symptoms worsen or if the condition fails to improve as anticipated.  I provided 15 minutes of non-face-to-face time during this encounter.  Evangeline Dakin, DNP  [facility] [address], [place], [place] [address] [phone]

## 2019-01-25 ENCOUNTER — Telehealth (INDEPENDENT_AMBULATORY_CARE_PROVIDER_SITE_OTHER): Payer: Medicare Other | Admitting: Neurology

## 2019-01-25 ENCOUNTER — Ambulatory Visit: Payer: Self-pay | Admitting: Neurology

## 2019-01-25 ENCOUNTER — Encounter: Payer: Self-pay | Admitting: Neurology

## 2019-01-25 ENCOUNTER — Other Ambulatory Visit: Payer: Self-pay

## 2019-01-25 DIAGNOSIS — G250 Essential tremor: Secondary | ICD-10-CM | POA: Diagnosis not present

## 2019-01-25 DIAGNOSIS — R413 Other amnesia: Secondary | ICD-10-CM

## 2019-01-25 MED ORDER — TOPIRAMATE 50 MG PO TABS: ORAL_TABLET | ORAL | 5 refills | Status: DC

## 2019-01-25 NOTE — Progress Notes (Signed)
I have reviewed and agreed above plan. 

## 2019-02-07 ENCOUNTER — Other Ambulatory Visit: Payer: Self-pay | Admitting: Surgery

## 2019-02-07 DIAGNOSIS — C20 Malignant neoplasm of rectum: Secondary | ICD-10-CM

## 2019-02-25 ENCOUNTER — Other Ambulatory Visit: Payer: Self-pay | Admitting: Cardiology

## 2019-03-15 ENCOUNTER — Ambulatory Visit
Admission: RE | Admit: 2019-03-15 | Discharge: 2019-03-15 | Disposition: A | Discharge status: Home / Self Care | Payer: Medicare Other | Source: Ambulatory Visit | Attending: Surgery | Admitting: Surgery

## 2019-03-15 DIAGNOSIS — C20 Malignant neoplasm of rectum: Secondary | ICD-10-CM

## 2019-03-28 ENCOUNTER — Telehealth: Payer: Self-pay | Admitting: Nurse Practitioner

## 2019-03-28 NOTE — Telephone Encounter (Signed)
Scheduled appt per 12/29 sch message- unable to reach pt daughter .left message with appt date and time

## 2019-03-29 ENCOUNTER — Telehealth: Payer: Self-pay | Admitting: Nurse Practitioner

## 2019-03-29 ENCOUNTER — Inpatient Hospital Stay: Payer: Medicare Other | Attending: Nurse Practitioner | Admitting: Nurse Practitioner

## 2019-03-29 NOTE — Telephone Encounter (Signed)
Patient/daughter could not be reached for phone visit after 2 attempts. Appointment will be rescheduled.  Cira Rue, NP  03/29/2019

## 2019-03-30 ENCOUNTER — Telehealth: Payer: Self-pay | Admitting: Nurse Practitioner

## 2019-03-30 NOTE — Telephone Encounter (Signed)
No los per 12/30. °

## 2019-04-03 ENCOUNTER — Encounter: Payer: Self-pay | Admitting: Nurse Practitioner

## 2019-04-03 ENCOUNTER — Inpatient Hospital Stay: Payer: Medicare Other | Attending: Nurse Practitioner | Admitting: Nurse Practitioner

## 2019-04-03 DIAGNOSIS — Z79899 Other long term (current) drug therapy: Secondary | ICD-10-CM | POA: Insufficient documentation

## 2019-04-03 DIAGNOSIS — C20 Malignant neoplasm of rectum: Secondary | ICD-10-CM | POA: Diagnosis not present

## 2019-04-03 DIAGNOSIS — N39498 Other specified urinary incontinence: Secondary | ICD-10-CM | POA: Diagnosis not present

## 2019-04-03 DIAGNOSIS — R630 Anorexia: Secondary | ICD-10-CM | POA: Diagnosis not present

## 2019-04-03 MED ORDER — MEGESTROL ACETATE 400 MG/10ML PO SUSP: 200.0000 mg | Freq: Every day | ORAL | 0 refills | Status: DC

## 2019-04-03 NOTE — Progress Notes (Signed)
[place]   [phone] [phone]   Clinic Follow up Note   Patient Care Team: Fanny Bien, MD as PCP - General (Family Medicine) Leonie Man, MD as PCP - Cardiology (Cardiology) Ronnette Juniper, MD as Consulting Physician (Gastroenterology) Arna Snipe, RN (Inactive) as Oncology Nurse Navigator Michael Boston, MD as Consulting Physician (General Surgery) Truitt Merle, MD as Consulting Physician (Hematology) Kyung Rudd, MD as Consulting Physician (Radiation Oncology) 04/03/2019   I connected with Megan Munoz on 04/03/19 at 1:34 PM EST by telephone visit and verified that I am speaking with the correct person using two identifiers.   I discussed the limitations, risks, security and privacy concerns of performing an evaluation and management service by telemedicine and the availability of in-person appointments. I also discussed with the patient that there may be a patient responsible charge related to this service. The patient expressed understanding and agreed to proceed.   Other persons participating in the visit and their role in the encounter: Daughter, [name]  Patient's location: home Provider's location: [place] office   CHIEF COMPLAINT: F/u anorexia and weight loss   SUMMARY OF ONCOLOGIC HISTORY: Oncology History Overview Note  Cancer Staging Rectal cancer [facility] Staging form: Colon and Rectum, AJCC 8th Edition - Clinical: Stage IIA (cT3, cN0, cM0) - Signed by Truitt Merle, MD on 09/21/2018    Rectal cancer ([place])  06/01/2018 Imaging   COLONOSCOPY - Firm, nodular, thickened, raised area felt at the anal verge found on perianal exam. 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 Biopsied. - Two 6 to 8 mm polyps in the transverse colon, removed with a hot snare. Resected and retrieved. - One 7 mm polyp in the ascending colon, removed with a hot snare. Resected and retrieved. - One 6 mm polyp in the cecum, removed with a hot snare. Resected and  retrieved. - One 4 mm polyp at the hepatic flexure, removed with a cold biopsy forceps. Resected and retrieved. - One 9 mm polyp in the descending colon, removed with a hot snare. Resected and retrieved. - Diverticulosis in the sigmoid colon and in the descending colon.   06/01/2018 Initial Biopsy   Pathology from colonoscopy:  1. All colon polyps negative for malignancy. 2. Tubulovillous adenoma with at least high grade dysplasia at the rectal tumor. There were several foci highly suspicious but not definitive for invasive adenocarcinoma.    09/03/2018 Imaging   PELVIC MRI IMPRESSION: 1. 3.0 cm polypoid mass along the right anterolateral aspect of the anal verge, as described above. 2. Given the location, it is unclear whether this is more appropriately characterized as rectal cancer versus anal cancer. Regardless, given the clinical information, rectal cancer imaging characterization has been provided. 3. Rectal adenocarcinoma T stage: T3d 4. Rectal adenocarcinoma N stage:  N0 5. Distance from tumor to the internal anal sphincter is 0 cm.   09/19/2018 Miscellaneous   TUMOR BOARD: concurrent radiation therapy and chemotherapy with Xeloda   09/21/2018 Initial Diagnosis   Rectal cancer ([place])   09/21/2018 Cancer Staging   Staging form: Colon and Rectum, AJCC 8th Edition - Clinical: Stage IIA (cT3, cN0, cM0) - Signed by Truitt Merle, MD on 09/21/2018   09/26/2018 - 11/07/2018 Radiation Therapy   ChemoRT with Xeloda beginning on 09/26/18 by Dr Lisbeth Renshaw ending on 11/07/18.    09/26/2018 - 11/07/2018 Chemotherapy   ChemoRT with Xeloda beginning on 09/26/18, Xeloda 1500mg  q12h on days of radiation. Dose reduced to 1000 mg on 7/27 PM and 7/28  AM; held Xeloda on 10/25/18 due to fatigue, chills, and UTI. Restarted on 10/31/18 at 1000mg  BID. Completed on 8/10.     03/15/2019 Imaging   MRI pelvis WO contrast IMPRESSION: Interval resolution of previously seen mass at the anorectal junction. No residual soft tissue  mass visualized. No evidence of pelvic metastatic disease.     CURRENT THERAPY: Surveillance   INTERVAL HISTORY: I connected with Ms. Notz and her daughter [name] today by phone. Patient feels fine lately. She had lost 7 lbs in the 10 days prior to last visit with Dr. Johney Maine in 02/2019. She drinks up to 2 Ensure daily. Appetite is low, and lacks motivation to eat food. Not interested in food she used to like. In general she is little weaker. Still able to perform ADLs without assistance but takes longer with more effort. She fell 2-3 months ago with minor superficial injury from which she has recovered. She had 2 urinary "accidents" in last 2-3 weeks. Denies dysuria, urgency, frequency, fever or chills. Has several BMs per day of varying consistency. With minimal leakage and mucus. No GI bleeding. Has some allergy symptoms with persistent cough, nasal drainage. Daughter says its not COVID. No chest pain or dyspnea.    MEDICAL HISTORY:  Past Medical History:  Diagnosis Date  . Anxiety   . Atherosclerosis of aortic arch [facility]) April 2017   Noted on CT chest, calcified aortic atherosclerosis at the arch.  . Atrial fibrillation, permanent ([place]) 03/2013   Previously on Atenolol for Rate - now on combination diltiazem and carvedilol for rate Control;  & Xarelto for AC. CHADS2VASC2 = 7.  . Cerebral infarction [facility]) 10/2008   MRI Brain 05/2014 for near syncope revealed: No acure infarct.  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   Mild small vessel disease type changes.  No intracranial hemorrhage.  Global atrophy.   Marland Kitchen COPD (chronic obstructive pulmonary disease) ([place])   . Coronary atherosclerosis due to calcified coronary lesion April 2017   Noted on CT chest  . Dementia ([place])    following 2010 stroke  . Diastolic dysfunction with chronic heart failure (HCC)    Exacerbated by A. fib RVR  . Hyperlipemia     . Hypertension   . Pneumonia   . PONV (postoperative nausea and vomiting)   . Stroke [facility])    2010- patient has dementia related to stroke   . Tinea unguium   . Tremor   . Tuberculosis     SURGICAL HISTORY: Past Surgical History:  Procedure Laterality Date  . BIOPSY  06/01/2018   Procedure: BIOPSY;  Surgeon: Ronnette Juniper, MD;  Location: [facility] ENDOSCOPY;  Service: Gastroenterology;;  . BLADDER SURGERY    . CARPAL TUNNEL RELEASE Left   . COLONOSCOPY N/A 06/01/2018   Procedure: COLONOSCOPY;  Surgeon: Ronnette Juniper, MD;  Location: [facility] ENDOSCOPY;  Service: Gastroenterology;  Laterality: N/A;  . ESOPHAGOGASTRODUODENOSCOPY (EGD) WITH PROPOFOL N/A 02/04/2018   Procedure: ESOPHAGOGASTRODUODENOSCOPY (EGD) WITH PROPOFOL;  Surgeon: Ronnette Juniper, MD;  Location: [place];  Service: Gastroenterology;  Laterality: N/A;  . FOOT SURGERY Right   . HERNIA REPAIR    . NASAL SINUS SURGERY    . POLYPECTOMY  06/01/2018   Procedure: POLYPECTOMY;  Surgeon: Ronnette Juniper, MD;  Location: [facility];  Service: Gastroenterology;;  . TOTAL ABDOMINAL HYSTERECTOMY      I have reviewed the social history and family history with the patient and  they are unchanged from previous note.  ALLERGIES:  is allergic to tetanus toxoids.  MEDICATIONS:  Current Outpatient Medications  Medication Sig Dispense Refill  . albuterol (PROVENTIL) (2.5 MG/3ML) 0.083% nebulizer solution Take 3 mLs (2.5 mg total) by nebulization every 6 (six) hours as needed for wheezing or shortness of breath. 30 vial 1  . Azelastine HCl 137 MCG/SPRAY SOLN Place 2 sprays into both nostrils 2 (two) times daily. Use in each nostril as directed     . B Complex Vitamins (VITAMIN B COMPLEX PO) Take 1 tablet by mouth daily.     Marland Kitchen BISACODYL 5 MG EC tablet TK AS DIRECTED    . calcitonin, salmon, (MIACALCIN) 200 UNIT/ACT nasal spray Place 1 spray into alternate nostrils daily.    . capecitabine (XELODA) 500 MG tablet Take 3 tablets (1,500 mg total) by mouth 2 (two)  times daily after a meal. Take on days of radiation, Monday through Friday 168 tablet 0  . Cholecalciferol (VITAMIN D3 SUPER STRENGTH) 2000 units CAPS Take 2,000 Units by mouth daily.     Marland Kitchen diltiazem (CARDIZEM CD) 120 MG 24 hr capsule TAKE 240 MG (2 TABLETS) DAILY. IF DIZZY OR BLOOD PRESSURE IS LOW MAY TAKE 120 MG ONLY THAT DAY 360 capsule 3  . diltiazem (CARDIZEM) 120 MG tablet Take 240 mg by mouth daily.    Marland Kitchen docusate sodium (COLACE) 100 MG capsule Take 100-200 mg by mouth 2 (two) times daily as needed for mild constipation. Pt does take at least 100 mg daily    . donepezil (ARICEPT) 10 MG tablet Take 1 tablet (10 mg total) by mouth at bedtime. 90 tablet 3  . feeding supplement, ENSURE ENLIVE, (ENSURE ENLIVE) LIQD Take 237 mLs by mouth 3 (three) times daily between meals. (Patient taking differently: Take 237 mLs by mouth See admin instructions. 2-3  Times daily) 237 mL 12  . ferrous sulfate 325 (65 FE) MG tablet Take 1 tablet (325 mg total) by mouth every other day. 30 tablet 0  . FLUoxetine (PROZAC) 40 MG capsule Take 40 mg by mouth daily.     . Fluticasone-Umeclidin-Vilant (TRELEGY ELLIPTA) 100-62.5-25 MCG/INH AEPB Inhale 1 puff into the lungs daily.    . furosemide (LASIX) 20 MG tablet Take 1 tablet (20 mg total) by mouth daily as needed for fluid or edema. (Patient taking differently: Take 20 mg by mouth every other day. ) 30 tablet 6  . loratadine (CLARITIN) 10 MG tablet Take 10 mg by mouth daily.    . megestrol (MEGACE) 400 MG/10ML suspension Take 5 mLs (200 mg total) by mouth daily. 240 mL 0  . Melatonin 10 MG TABS Take 10 mg by mouth at bedtime.     . memantine (NAMENDA) 10 MG tablet Take 1 tablet (10 mg total) by mouth 2 (two) times daily. 180 tablet 3  . Multiple Vitamin (MULTIVITAMIN) capsule Take 1 capsule by mouth daily.    . ondansetron (ZOFRAN) 8 MG tablet TAKE 0.5-1 TABLET (4-8 MG TOTAL) BY MOUTH EVERY 8 (EIGHT) HOURS AS NEEDED FOR NAUSEA OR VOMITING. 30 tablet 0  . potassium  chloride SA (K-DUR) 20 MEQ tablet Take 1 tablet (20 mEq total) by mouth daily. 30 tablet 0  . rivaroxaban (XARELTO) 10 MG TABS tablet Take 1 tablet (10 mg total) by mouth daily. 30 tablet 1  . silver sulfADIAZINE (SILVADENE) 1 % cream Apply 1 application topically 2 (two) times daily. 50 g 0  . simvastatin (ZOCOR) 40 MG tablet Take 40 mg  by mouth daily.    Marland Kitchen sulfamethoxazole-trimethoprim (BACTRIM DS) 800-160 MG tablet Take 1 tablet by mouth 2 (two) times daily. 6 tablet 0  . topiramate (TOPAMAX) 50 MG tablet Start taking 50 mg at bedtime x 2 week, then take 50 mg twice a day 60 tablet 5   No current facility-administered medications for this visit.    PHYSICAL EXAMINATION: ECOG PERFORMANCE STATUS: 1-2  There were no vitals filed for this visit. There were no vitals filed for this visit.  Patient appears well over the phone. Speech is clear and non-pressured. Alert and oriented. No cough or conversational dyspnea.     LABORATORY DATA:  No labs for this visit    RADIOGRAPHIC STUDIES: I have personally reviewed the radiological images as listed and agreed with the findings in the report. No results found.   ASSESSMENT & PLAN: 86 yo female with   1. Anorexia, weight loss  -She lost weight during chemoRT but was able to gain some back, now with 7+ lbs weight loss in a month -we discussed nutrition and appetite stimulant options.  -She is on fluoxetine which is effective for many years, I would not change this to mirtazapine now -I don't feel steroids is a durable option, and marinol I would not use given her age and she is not nauseous or on chemo -will try low dose megace 200 mg daily. We reviewed potential side effects such as diarrhea and risk of thrombosis. She is mobile at home and able to ambulate. Also on xarelto for afib and h/o stroke, she is compliant with this -given she has weakness and incontinence in last couple weeks, will check UA to r/o infection as a cause for her  recent appetite change. She will come on 1/6 for lab; I will call with result  -will do phone visit in 3-4 weeks to monitor her response and weight gain  2. Distal rectal adenocarcinoma [id number] Stage IIA -diagnosed 05/2018, s/o chemoRT with Xeloda 1500 mg BID on days of radiation 09/26/18 - 11/07/18 -Recent DRE per Dr. Johney Maine without evidence of residual cancer  -Surveillance MRI on 03/15/19 showed no evidence of residual or recurrent mass or metastatic disease in the pelvis. -Continue watchful wait approach given she is high risk surgical candidate  -she is having stool leakage and a couple urinary incontinence episodes lately, she has been referred to pelvic floor PT.  -Daughter [name] is asking for general PT to help improve her overall strength for ADLs. She fell recently, and is requiring longer and more effort to do her normal activity. I will refer her.  -Routine f/u with [place] in 05/2019   3. Other co-morbidities - HTN, CHF, depression, anxiety, h/o stroke, dementia  -continue routine meds and f/u with providers -lives with daughter [name] who assists and is very involved in her care   PLAN: -Rx: megace 200 mg daily low dose for anorexia and weight loss  -Lab 1/6 to r/o UTI -Phone visit in 3-4 weeks to evaluate response to appetite stimulant  -Refer to [facility] for generalized strengthening for ADLs, continue pelvic floor PT as referred by Dr. Johney Maine  -Routine cancer surveillance f/u in 05/2019 as scheduled   No problem-specific Assessment & Plan notes found for this encounter.   Orders Placed This Encounter  Procedures  . Urine Culture    Standing Status:   Future    Standing Expiration Date:   04/02/2020  . Urinalysis, Complete w Microscopic    Standing Status:  Future    Standing Expiration Date:   04/02/2020  . Ambulatory referral to Physical Therapy    Referral Priority:   Routine    Referral Type:   Physical Medicine    Referral Reason:   Specialty Services Required     Requested Specialty:   Physical Therapy    Number of Visits Requested:   1   All questions were answered. The patient knows to call the clinic with any problems, questions or concerns. No barriers to learning were detected. I spent 21 minutes non-face-to-face time on today's phone call and more than 50% was on counseling.     Alla Feeling, NP 04/03/19

## 2019-04-04 ENCOUNTER — Telehealth: Payer: Self-pay | Admitting: Nurse Practitioner

## 2019-04-04 NOTE — Telephone Encounter (Signed)
I left a message regarding schedule  

## 2019-04-05 ENCOUNTER — Other Ambulatory Visit: Payer: Self-pay

## 2019-04-05 ENCOUNTER — Inpatient Hospital Stay: Payer: Medicare Other

## 2019-04-05 DIAGNOSIS — F419 Anxiety disorder, unspecified: Secondary | ICD-10-CM | POA: Diagnosis not present

## 2019-04-05 DIAGNOSIS — F0390 Unspecified dementia without behavioral disturbance: Secondary | ICD-10-CM | POA: Diagnosis not present

## 2019-04-05 DIAGNOSIS — R630 Anorexia: Secondary | ICD-10-CM | POA: Diagnosis not present

## 2019-04-05 DIAGNOSIS — I2510 Atherosclerotic heart disease of native coronary artery without angina pectoris: Secondary | ICD-10-CM | POA: Diagnosis not present

## 2019-04-05 DIAGNOSIS — I5032 Chronic diastolic (congestive) heart failure: Secondary | ICD-10-CM | POA: Diagnosis not present

## 2019-04-05 DIAGNOSIS — M6281 Muscle weakness (generalized): Secondary | ICD-10-CM | POA: Diagnosis not present

## 2019-04-05 DIAGNOSIS — C20 Malignant neoplasm of rectum: Secondary | ICD-10-CM | POA: Diagnosis not present

## 2019-04-05 DIAGNOSIS — I69311 Memory deficit following cerebral infarction: Secondary | ICD-10-CM | POA: Diagnosis not present

## 2019-04-05 DIAGNOSIS — Z79899 Other long term (current) drug therapy: Secondary | ICD-10-CM | POA: Diagnosis not present

## 2019-04-05 DIAGNOSIS — R000 Tachycardia, unspecified: Secondary | ICD-10-CM | POA: Diagnosis not present

## 2019-04-05 DIAGNOSIS — N39498 Other specified urinary incontinence: Secondary | ICD-10-CM

## 2019-04-05 DIAGNOSIS — Z8615 Personal history of latent tuberculosis infection: Secondary | ICD-10-CM | POA: Diagnosis not present

## 2019-04-05 DIAGNOSIS — I4821 Permanent atrial fibrillation: Secondary | ICD-10-CM | POA: Diagnosis not present

## 2019-04-05 DIAGNOSIS — J449 Chronic obstructive pulmonary disease, unspecified: Secondary | ICD-10-CM | POA: Diagnosis not present

## 2019-04-05 DIAGNOSIS — I110 Hypertensive heart disease with heart failure: Secondary | ICD-10-CM | POA: Diagnosis not present

## 2019-04-05 DIAGNOSIS — F329 Major depressive disorder, single episode, unspecified: Secondary | ICD-10-CM | POA: Diagnosis not present

## 2019-04-05 DIAGNOSIS — I700 Atherosclerosis of aorta: Secondary | ICD-10-CM | POA: Diagnosis not present

## 2019-04-05 LAB — URINALYSIS, COMPLETE (UACMP) WITH MICROSCOPIC
Bilirubin Urine: NEGATIVE
Glucose, UA: NEGATIVE mg/dL
Hgb urine dipstick: NEGATIVE
Ketones, ur: NEGATIVE mg/dL
Nitrite: NEGATIVE
Protein, ur: NEGATIVE mg/dL
Specific Gravity, Urine: 1.014 (ref 1.005–1.030)
pH: 7 (ref 5.0–8.0)

## 2019-04-05 LAB — CMP (CANCER CENTER ONLY)
ALT: 11 U/L (ref 0–44)
AST: 18 U/L (ref 15–41)
Albumin: 3.8 g/dL (ref 3.5–5.0)
Alkaline Phosphatase: 85 U/L (ref 38–126)
Anion gap: 9 (ref 5–15)
BUN: 22 mg/dL (ref 8–23)
CO2: 27 mmol/L (ref 22–32)
Calcium: 9.1 mg/dL (ref 8.9–10.3)
Chloride: 107 mmol/L (ref 98–111)
Creatinine: 0.78 mg/dL (ref 0.44–1.00)
GFR, Est AFR Am: >60 mL/min (ref >60)
GFR, Estimated: >60 mL/min (ref >60)
Glucose, Bld: 103 mg/dL — ABNORMAL HIGH (ref 70–99)
Potassium: 3.7 mmol/L (ref 3.5–5.1)
Sodium: 143 mmol/L (ref 135–145)
Total Bilirubin: 0.4 mg/dL (ref 0.3–1.2)
Total Protein: 7.1 g/dL (ref 6.5–8.1)

## 2019-04-05 LAB — CBC WITH DIFFERENTIAL (CANCER CENTER ONLY)
Abs Immature Granulocytes: 0.02 10*3/uL (ref 0.00–0.07)
Basophils Absolute: 0 10*3/uL (ref 0.0–0.1)
Basophils Relative: 0 %
Eosinophils Absolute: 0 10*3/uL (ref 0.0–0.5)
Eosinophils Relative: 0 %
HCT: 36.4 % (ref 36.0–46.0)
Hemoglobin: 11.7 g/dL — ABNORMAL LOW (ref 12.0–15.0)
Immature Granulocytes: 0 %
Lymphocytes Relative: 14 %
Lymphs Abs: 0.7 10*3/uL (ref 0.7–4.0)
MCH: 30.4 pg (ref 26.0–34.0)
MCHC: 32.1 g/dL (ref 30.0–36.0)
MCV: 94.5 fL (ref 80.0–100.0)
Monocytes Absolute: 0.3 10*3/uL (ref 0.1–1.0)
Monocytes Relative: 7 %
Neutro Abs: 3.6 10*3/uL (ref 1.7–7.7)
Neutrophils Relative %: 79 %
Platelet Count: 228 10*3/uL (ref 150–400)
RBC: 3.85 MIL/uL — ABNORMAL LOW (ref 3.87–5.11)
RDW: 12.8 % (ref 11.5–15.5)
WBC Count: 4.6 10*3/uL (ref 4.0–10.5)
nRBC: 0 % (ref 0.0–0.2)

## 2019-04-05 LAB — CEA (IN HOUSE-CHCC): CEA (CHCC-In House): 2.72 ng/mL (ref 0.00–5.00)

## 2019-04-06 ENCOUNTER — Telehealth: Payer: Self-pay | Admitting: *Deleted

## 2019-04-06 LAB — URINE CULTURE: Culture: NO GROWTH

## 2019-04-06 NOTE — Telephone Encounter (Signed)
Per Cira Rue, NP, called pt daughter to notify her that Megan Munoz urine culture is negative for UTI, mild anemia is stable and tumor markers are normal. Labs otherwise unremarkable. Pt daughter appreciative and verbalized understanding

## 2019-04-06 NOTE — Telephone Encounter (Signed)
-----   Message from Alla Feeling, NP sent at 04/06/2019 11:53 AM EST ----- Please let her know urine culture is negative for UTI, mild anemia is stable. Tumor marker is normal. Labs otherwise unremarkable.  Thanks, Regan Rakers

## 2019-04-11 DIAGNOSIS — I69311 Memory deficit following cerebral infarction: Secondary | ICD-10-CM | POA: Diagnosis not present

## 2019-04-11 DIAGNOSIS — M6281 Muscle weakness (generalized): Secondary | ICD-10-CM | POA: Diagnosis not present

## 2019-04-11 DIAGNOSIS — I110 Hypertensive heart disease with heart failure: Secondary | ICD-10-CM | POA: Diagnosis not present

## 2019-04-11 DIAGNOSIS — C20 Malignant neoplasm of rectum: Secondary | ICD-10-CM | POA: Diagnosis not present

## 2019-04-11 DIAGNOSIS — F0390 Unspecified dementia without behavioral disturbance: Secondary | ICD-10-CM | POA: Diagnosis not present

## 2019-04-11 DIAGNOSIS — R630 Anorexia: Secondary | ICD-10-CM | POA: Diagnosis not present

## 2019-04-13 DIAGNOSIS — F0390 Unspecified dementia without behavioral disturbance: Secondary | ICD-10-CM | POA: Diagnosis not present

## 2019-04-13 DIAGNOSIS — C20 Malignant neoplasm of rectum: Secondary | ICD-10-CM | POA: Diagnosis not present

## 2019-04-13 DIAGNOSIS — M6281 Muscle weakness (generalized): Secondary | ICD-10-CM | POA: Diagnosis not present

## 2019-04-13 DIAGNOSIS — R630 Anorexia: Secondary | ICD-10-CM | POA: Diagnosis not present

## 2019-04-13 DIAGNOSIS — I110 Hypertensive heart disease with heart failure: Secondary | ICD-10-CM | POA: Diagnosis not present

## 2019-04-13 DIAGNOSIS — I69311 Memory deficit following cerebral infarction: Secondary | ICD-10-CM | POA: Diagnosis not present

## 2019-04-18 DIAGNOSIS — C20 Malignant neoplasm of rectum: Secondary | ICD-10-CM | POA: Diagnosis not present

## 2019-04-18 DIAGNOSIS — R630 Anorexia: Secondary | ICD-10-CM | POA: Diagnosis not present

## 2019-04-18 DIAGNOSIS — M6281 Muscle weakness (generalized): Secondary | ICD-10-CM | POA: Diagnosis not present

## 2019-04-18 DIAGNOSIS — I110 Hypertensive heart disease with heart failure: Secondary | ICD-10-CM | POA: Diagnosis not present

## 2019-04-18 DIAGNOSIS — I69311 Memory deficit following cerebral infarction: Secondary | ICD-10-CM | POA: Diagnosis not present

## 2019-04-18 DIAGNOSIS — F0390 Unspecified dementia without behavioral disturbance: Secondary | ICD-10-CM | POA: Diagnosis not present

## 2019-04-19 DIAGNOSIS — R630 Anorexia: Secondary | ICD-10-CM | POA: Diagnosis not present

## 2019-04-19 DIAGNOSIS — M6281 Muscle weakness (generalized): Secondary | ICD-10-CM | POA: Diagnosis not present

## 2019-04-19 DIAGNOSIS — I110 Hypertensive heart disease with heart failure: Secondary | ICD-10-CM | POA: Diagnosis not present

## 2019-04-19 DIAGNOSIS — I69311 Memory deficit following cerebral infarction: Secondary | ICD-10-CM | POA: Diagnosis not present

## 2019-04-19 DIAGNOSIS — F0390 Unspecified dementia without behavioral disturbance: Secondary | ICD-10-CM | POA: Diagnosis not present

## 2019-04-19 DIAGNOSIS — C20 Malignant neoplasm of rectum: Secondary | ICD-10-CM | POA: Diagnosis not present

## 2019-04-20 ENCOUNTER — Telehealth: Payer: Self-pay

## 2019-04-20 DIAGNOSIS — I110 Hypertensive heart disease with heart failure: Secondary | ICD-10-CM | POA: Diagnosis not present

## 2019-04-20 DIAGNOSIS — M6281 Muscle weakness (generalized): Secondary | ICD-10-CM | POA: Diagnosis not present

## 2019-04-20 DIAGNOSIS — R630 Anorexia: Secondary | ICD-10-CM | POA: Diagnosis not present

## 2019-04-20 DIAGNOSIS — F0390 Unspecified dementia without behavioral disturbance: Secondary | ICD-10-CM | POA: Diagnosis not present

## 2019-04-20 DIAGNOSIS — I69311 Memory deficit following cerebral infarction: Secondary | ICD-10-CM | POA: Diagnosis not present

## 2019-04-20 DIAGNOSIS — C20 Malignant neoplasm of rectum: Secondary | ICD-10-CM | POA: Diagnosis not present

## 2019-04-20 NOTE — Telephone Encounter (Signed)
Faxed signed order for ST to [facility], sent to HIM for scan to chart.

## 2019-04-20 NOTE — Telephone Encounter (Signed)
Faxed order for ST for swallow study to [facility] to fax [phone]

## 2019-04-21 DIAGNOSIS — C20 Malignant neoplasm of rectum: Secondary | ICD-10-CM | POA: Diagnosis not present

## 2019-04-21 DIAGNOSIS — I110 Hypertensive heart disease with heart failure: Secondary | ICD-10-CM | POA: Diagnosis not present

## 2019-04-21 DIAGNOSIS — I69311 Memory deficit following cerebral infarction: Secondary | ICD-10-CM | POA: Diagnosis not present

## 2019-04-21 DIAGNOSIS — R630 Anorexia: Secondary | ICD-10-CM | POA: Diagnosis not present

## 2019-04-21 DIAGNOSIS — M6281 Muscle weakness (generalized): Secondary | ICD-10-CM | POA: Diagnosis not present

## 2019-04-21 DIAGNOSIS — F0390 Unspecified dementia without behavioral disturbance: Secondary | ICD-10-CM | POA: Diagnosis not present

## 2019-04-24 ENCOUNTER — Inpatient Hospital Stay (HOSPITAL_BASED_OUTPATIENT_CLINIC_OR_DEPARTMENT_OTHER): Payer: Medicare Other | Admitting: Nurse Practitioner

## 2019-04-24 ENCOUNTER — Encounter: Payer: Self-pay | Admitting: Nurse Practitioner

## 2019-04-24 DIAGNOSIS — R630 Anorexia: Secondary | ICD-10-CM

## 2019-04-24 DIAGNOSIS — C20 Malignant neoplasm of rectum: Secondary | ICD-10-CM | POA: Diagnosis not present

## 2019-04-24 NOTE — Progress Notes (Signed)
[place]   [phone] [phone]   Clinic Follow up Note   Patient Care Team: Megan Bien, MD as PCP - General (Family Medicine) Megan Man, MD as PCP - Cardiology (Cardiology) Megan Juniper, MD as Consulting Physician (Gastroenterology) Megan Snipe, RN (Inactive) as Oncology Nurse Navigator Megan Boston, MD as Consulting Physician (General Surgery) Megan Merle, MD as Consulting Physician (Hematology) Megan Rudd, MD as Consulting Physician (Radiation Oncology) 04/24/2019  I connected with Megan Munoz on 04/24/19 at  2:45 PM EST by telephone visit and verified that I am speaking with the correct person using two identifiers.   I discussed the limitations, risks, security and privacy concerns of performing an evaluation and management service by telemedicine and the availability of in-person appointments. I also discussed with the patient that there may be a patient responsible charge related to this service. The patient expressed understanding and agreed to proceed.   Other persons participating in the visit and their role in the encounter: Daughter, Megan Munoz   Patient's location: Home Provider's location: [place] office  CHIEF COMPLAINT: F/u anorexia and weight loss   SUMMARY OF ONCOLOGIC HISTORY: Oncology History Overview Note  Cancer Staging Rectal cancer [facility]) Staging form: Colon and Rectum, AJCC 8th Edition - Clinical: Stage IIA (cT3, cN0, cM0) - Signed by Megan Merle, MD on 09/21/2018    Rectal cancer ([place])  06/01/2018 Imaging   COLONOSCOPY - Firm, nodular, thickened, raised area felt at the anal verge found on perianal exam. 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 Biopsied. - Two 6 to 8 mm polyps in the transverse colon, removed with a hot snare. Resected and retrieved. - One 7 mm polyp in the ascending colon, removed with a hot snare. Resected and retrieved. - One 6 mm polyp in the cecum, removed with a hot snare. Resected and  retrieved. - One 4 mm polyp at the hepatic flexure, removed with a cold biopsy forceps. Resected and retrieved. - One 9 mm polyp in the descending colon, removed with a hot snare. Resected and retrieved. - Diverticulosis in the sigmoid colon and in the descending colon.   06/01/2018 Initial Biopsy   Pathology from colonoscopy:  1. All colon polyps negative for malignancy. 2. Tubulovillous adenoma with at least high grade dysplasia at the rectal tumor. There were several foci highly suspicious but not definitive for invasive adenocarcinoma.    09/03/2018 Imaging   PELVIC MRI IMPRESSION: 1. 3.0 cm polypoid mass along the right anterolateral aspect of the anal verge, as described above. 2. Given the location, it is unclear whether this is more appropriately characterized as rectal cancer versus anal cancer. Regardless, given the clinical information, rectal cancer imaging characterization has been provided. 3. Rectal adenocarcinoma T stage: T3d 4. Rectal adenocarcinoma N stage:  N0 5. Distance from tumor to the internal anal sphincter is 0 cm.   09/19/2018 Miscellaneous   TUMOR BOARD: concurrent radiation therapy and chemotherapy with Xeloda   09/21/2018 Initial Diagnosis   Rectal cancer ([place])   09/21/2018 Cancer Staging   Staging form: Colon and Rectum, AJCC 8th Edition - Clinical: Stage IIA (cT3, cN0, cM0) - Signed by Megan Merle, MD on 09/21/2018   09/26/2018 - 11/07/2018 Radiation Therapy   ChemoRT with Xeloda beginning on 09/26/18 by Dr Lisbeth Renshaw ending on 11/07/18.    09/26/2018 - 11/07/2018 Chemotherapy   ChemoRT with Xeloda beginning on 09/26/18, Xeloda 1500mg  q12h on days of radiation. Dose reduced to 1000 mg on 7/27 PM and 7/28  AM; held Xeloda on 10/25/18 due to fatigue, chills, and UTI. Restarted on 10/31/18 at 1000mg  BID. Completed on 8/10.     03/15/2019 Imaging   MRI pelvis WO contrast IMPRESSION: Interval resolution of previously seen mass at the anorectal junction. No residual soft tissue  mass visualized. No evidence of pelvic metastatic disease.     INTERVAL HISTORY: Megan Munoz presents with her daughter Megan Munoz by phone. She identifies herself by name and DOB. She is doing "fine, could be better." Her weight is holding steady, down 1 lb since f/u on 1/4. She has not started Megace yet, daughter didn't realize it was liquid and has not offered it yet. [name] is taking more ensure, eating some fruit and an occasional meal with is a "bonus" but not routine. She swallows without difficulty, no coughing or regurgitation. No n/v/c/d. Denies rectal pain or incontinence issues. She started more PT/OT. Her cognition has declined, a recent mini-cog score of 0. OT is helping to safely determine if she can do basic ADLs. She has a speech/swallow study coming up.    MEDICAL HISTORY:  Past Medical History:  Diagnosis Date  . Anxiety   . Atherosclerosis of aortic arch [facility]) April 2017   Noted on CT chest, calcified aortic atherosclerosis at the arch.  . Atrial fibrillation, permanent ([place]) 03/2013   Previously on Atenolol for Rate - now on combination diltiazem and carvedilol for rate Control;  & Xarelto for AC. CHADS2VASC2 = 7.  . Cerebral infarction [facility]) 10/2008   MRI Brain 05/2014 for near syncope revealed: No acure infarct.  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   Mild small vessel disease type changes.  No intracranial hemorrhage.  Global atrophy.   Marland Kitchen COPD (chronic obstructive pulmonary disease) ([place])   . Coronary atherosclerosis due to calcified coronary lesion April 2017   Noted on CT chest  . Dementia ([place])    following 2010 stroke  . Diastolic dysfunction with chronic heart failure (HCC)    Exacerbated by A. fib RVR  . Hyperlipemia   . Hypertension   . Pneumonia   . PONV (postoperative nausea and vomiting)   . Stroke [facility])    2010- patient has dementia related to stroke   .  Tinea unguium   . Tremor   . Tuberculosis     SURGICAL HISTORY: Past Surgical History:  Procedure Laterality Date  . BIOPSY  06/01/2018   Procedure: BIOPSY;  Surgeon: Megan Juniper, MD;  Location: [facility];  Service: Gastroenterology;;  . BLADDER SURGERY    . CARPAL TUNNEL RELEASE Left   . COLONOSCOPY N/A 06/01/2018   Procedure: COLONOSCOPY;  Surgeon: Megan Juniper, MD;  Location: [facility];  Service: Gastroenterology;  Laterality: N/A;  . ESOPHAGOGASTRODUODENOSCOPY (EGD) WITH PROPOFOL N/A 02/04/2018   Procedure: ESOPHAGOGASTRODUODENOSCOPY (EGD) WITH PROPOFOL;  Surgeon: Megan Juniper, MD;  Location: [place];  Service: Gastroenterology;  Laterality: N/A;  . FOOT SURGERY Right   . HERNIA REPAIR    . NASAL SINUS SURGERY    . POLYPECTOMY  06/01/2018   Procedure: POLYPECTOMY;  Surgeon: Megan Juniper, MD;  Location: [facility];  Service: Gastroenterology;;  . TOTAL ABDOMINAL HYSTERECTOMY      I have reviewed the social history and family history with the patient and they are unchanged from previous note.  ALLERGIES:  is allergic to tetanus toxoids.  MEDICATIONS:  Current Outpatient Medications  Medication Sig Dispense Refill  .  albuterol (PROVENTIL) (2.5 MG/3ML) 0.083% nebulizer solution Take 3 mLs (2.5 mg total) by nebulization every 6 (six) hours as needed for wheezing or shortness of breath. 30 vial 1  . Azelastine HCl 137 MCG/SPRAY SOLN Place 2 sprays into both nostrils 2 (two) times daily. Use in each nostril as directed     . B Complex Vitamins (VITAMIN B COMPLEX PO) Take 1 tablet by mouth daily.     Marland Kitchen BISACODYL 5 MG EC tablet TK AS DIRECTED    . calcitonin, salmon, (MIACALCIN) 200 UNIT/ACT nasal spray Place 1 spray into alternate nostrils daily.    . capecitabine (XELODA) 500 MG tablet Take 3 tablets (1,500 mg total) by mouth 2 (two) times daily after a meal. Take on days of radiation, Monday through Friday 168 tablet 0  . Cholecalciferol (VITAMIN D3 SUPER STRENGTH) 2000 units  CAPS Take 2,000 Units by mouth daily.     Marland Kitchen diltiazem (CARDIZEM CD) 120 MG 24 hr capsule TAKE 240 MG (2 TABLETS) DAILY. IF DIZZY OR BLOOD PRESSURE IS LOW MAY TAKE 120 MG ONLY THAT DAY 360 capsule 3  . diltiazem (CARDIZEM) 120 MG tablet Take 240 mg by mouth daily.    Marland Kitchen docusate sodium (COLACE) 100 MG capsule Take 100-200 mg by mouth 2 (two) times daily as needed for mild constipation. Pt does take at least 100 mg daily    . donepezil (ARICEPT) 10 MG tablet Take 1 tablet (10 mg total) by mouth at bedtime. 90 tablet 3  . feeding supplement, ENSURE ENLIVE, (ENSURE ENLIVE) LIQD Take 237 mLs by mouth 3 (three) times daily between meals. (Patient taking differently: Take 237 mLs by mouth See admin instructions. 2-3  Times daily) 237 mL 12  . ferrous sulfate 325 (65 FE) MG tablet Take 1 tablet (325 mg total) by mouth every other day. 30 tablet 0  . FLUoxetine (PROZAC) 40 MG capsule Take 40 mg by mouth daily.     . Fluticasone-Umeclidin-Vilant (TRELEGY ELLIPTA) 100-62.5-25 MCG/INH AEPB Inhale 1 puff into the lungs daily.    . furosemide (LASIX) 20 MG tablet Take 1 tablet (20 mg total) by mouth daily as needed for fluid or edema. (Patient taking differently: Take 20 mg by mouth every other day. ) 30 tablet 6  . loratadine (CLARITIN) 10 MG tablet Take 10 mg by mouth daily.    . megestrol (MEGACE) 400 MG/10ML suspension Take 5 mLs (200 mg total) by mouth daily. 240 mL 0  . Melatonin 10 MG TABS Take 10 mg by mouth at bedtime.     . memantine (NAMENDA) 10 MG tablet Take 1 tablet (10 mg total) by mouth 2 (two) times daily. 180 tablet 3  . Multiple Vitamin (MULTIVITAMIN) capsule Take 1 capsule by mouth daily.    . ondansetron (ZOFRAN) 8 MG tablet TAKE 0.5-1 TABLET (4-8 MG TOTAL) BY MOUTH EVERY 8 (EIGHT) HOURS AS NEEDED FOR NAUSEA OR VOMITING. 30 tablet 0  . potassium chloride SA (K-DUR) 20 MEQ tablet Take 1 tablet (20 mEq total) by mouth daily. 30 tablet 0  . rivaroxaban (XARELTO) 10 MG TABS tablet Take 1 tablet  (10 mg total) by mouth daily. 30 tablet 1  . silver sulfADIAZINE (SILVADENE) 1 % cream Apply 1 application topically 2 (two) times daily. 50 g 0  . simvastatin (ZOCOR) 40 MG tablet Take 40 mg by mouth daily.    Marland Kitchen sulfamethoxazole-trimethoprim (BACTRIM DS) 800-160 MG tablet Take 1 tablet by mouth 2 (two) times daily. 6 tablet 0  .  topiramate (TOPAMAX) 50 MG tablet Start taking 50 mg at bedtime x 2 week, then take 50 mg twice a day 60 tablet 5   No current facility-administered medications for this visit.    PHYSICAL EXAMINATION:  There were no vitals filed for this visit. There were no vitals filed for this visit.  Patient appears well over the phone. Speech is fluent and clear. Answers questions appropriately. Mood and affect appear normal  LABORATORY DATA:  I have reviewed the data as listed CBC Latest Ref Rng & Units 04/05/2019 01/12/2019 12/12/2018  WBC 4.0 - 10.5 K/uL 4.6 3.2(L) 3.8(L)  Hemoglobin 12.0 - 15.0 g/dL 11.7(L) 11.7(L) 11.8(L)  Hematocrit 36.0 - 46.0 % 36.4 36.5 37.5  Platelets 150 - 400 K/uL 228 191 187     CMP Latest Ref Rng & Units 04/05/2019 01/12/2019 12/12/2018  Glucose 70 - 99 mg/dL 103(H) 80 113(H)  BUN 8 - 23 mg/dL 22 18 12   Creatinine 0.44 - 1.00 mg/dL 0.78 0.73 0.69  Sodium 135 - 145 mmol/L 143 142 142  Potassium 3.5 - 5.1 mmol/L 3.7 3.9 3.8  Chloride 98 - 111 mmol/L 107 104 107  CO2 22 - 32 mmol/L 27 29 29   Calcium 8.9 - 10.3 mg/dL 9.1 9.4 8.9  Total Protein 6.5 - 8.1 g/dL 7.1 7.3 6.8  Total Bilirubin 0.3 - 1.2 mg/dL 0.4 0.6 0.6  Alkaline Phos 38 - 126 U/L 85 97 85  AST 15 - 41 U/L 18 23 23   ALT 0 - 44 U/L 11 13 11       RADIOGRAPHIC STUDIES: I have personally reviewed the radiological images as listed and agreed with the findings in the report. No results found.   ASSESSMENT & PLAN: 86 yo female with   1. Anorexia, weight loss  -She lost weight during chemoRT but was able to gain some back, had lost 7 lbs in the 10 days prior to last f/u on  1/4. Based on her symptoms and other medications, I recommended megace 200 mg daily  -her weight loss has stabilized lately, only lost 1 lb since f/u on 1/4. She has not started Megace because it's a liquid.  -taking 3 Ensure per day -She is currently undergoing speech/swallow study and is involved with PT/OT. I think it's reasonable to hold megace until swallow study can be done. Her daughter does not have concerns about her safety to swallow.  -I will refer her to our dietician  -continue monitoring   2. Distal rectal adenocarcinoma cT3N0M0 Stage IIA -diagnosed 05/2018, s/o chemoRT with Xeloda 1500 mg BID on days of radiation 09/26/18 - 11/07/18 -Recent DRE per Dr. Johney Maine without evidence of residual cancer  -Surveillance MRI on 03/15/19 showed no evidence of residual or recurrent mass or metastatic disease in the pelvis. -Continue watchful wait approach given she is high risk surgical candidate  -she had stool leakage and a couple urinary incontinence episodes lately, she has been referred to pelvic floor PT.  -Ms. Trouten appears stable. Denies rectal pain, bleeding, or change in bowel habits lately.   3. Other co-morbidities - HTN, CHF, depression, anxiety, h/o stroke, dementia  -continue routine meds and f/u with providers -lives with daughter Megan Munoz who assists and is very involved in her care  -OT/PT involved for evaluation/management of cognitive decline and functional limitations   PLAN: -Refer to dietician  -Routine lab and f/u in 05/2019    No problem-specific Assessment & Plan notes found for this encounter.   Orders Placed This Encounter  Procedures  . Ambulatory referral to Nutrition and Diabetic [facility]    Referral Priority:   Routine    Referral Type:   Consultation    Referral Reason:   Specialty Services Required    Number of Visits Requested:   1   All questions were answered. The patient knows to call the clinic with any problems, questions or concerns. No barriers to  learning were detected. I spent 16 minutes non-face-to-face time on today's virtual call, and more than 50% was on counseling.      Alla Feeling, NP 04/24/19

## 2019-04-25 ENCOUNTER — Telehealth: Payer: Self-pay | Admitting: Nurse Practitioner

## 2019-04-25 DIAGNOSIS — C20 Malignant neoplasm of rectum: Secondary | ICD-10-CM | POA: Diagnosis not present

## 2019-04-25 DIAGNOSIS — F0390 Unspecified dementia without behavioral disturbance: Secondary | ICD-10-CM | POA: Diagnosis not present

## 2019-04-25 DIAGNOSIS — I110 Hypertensive heart disease with heart failure: Secondary | ICD-10-CM | POA: Diagnosis not present

## 2019-04-25 DIAGNOSIS — M6281 Muscle weakness (generalized): Secondary | ICD-10-CM | POA: Diagnosis not present

## 2019-04-25 DIAGNOSIS — I69311 Memory deficit following cerebral infarction: Secondary | ICD-10-CM | POA: Diagnosis not present

## 2019-04-25 DIAGNOSIS — R630 Anorexia: Secondary | ICD-10-CM | POA: Diagnosis not present

## 2019-04-25 NOTE — Telephone Encounter (Signed)
No los per 1/25,

## 2019-04-26 DIAGNOSIS — R630 Anorexia: Secondary | ICD-10-CM | POA: Diagnosis not present

## 2019-04-26 DIAGNOSIS — F0390 Unspecified dementia without behavioral disturbance: Secondary | ICD-10-CM | POA: Diagnosis not present

## 2019-04-26 DIAGNOSIS — M6281 Muscle weakness (generalized): Secondary | ICD-10-CM | POA: Diagnosis not present

## 2019-04-26 DIAGNOSIS — C20 Malignant neoplasm of rectum: Secondary | ICD-10-CM | POA: Diagnosis not present

## 2019-04-26 DIAGNOSIS — I110 Hypertensive heart disease with heart failure: Secondary | ICD-10-CM | POA: Diagnosis not present

## 2019-04-26 DIAGNOSIS — I69311 Memory deficit following cerebral infarction: Secondary | ICD-10-CM | POA: Diagnosis not present

## 2019-04-27 DIAGNOSIS — M6281 Muscle weakness (generalized): Secondary | ICD-10-CM | POA: Diagnosis not present

## 2019-04-27 DIAGNOSIS — I110 Hypertensive heart disease with heart failure: Secondary | ICD-10-CM | POA: Diagnosis not present

## 2019-04-27 DIAGNOSIS — I69311 Memory deficit following cerebral infarction: Secondary | ICD-10-CM | POA: Diagnosis not present

## 2019-04-27 DIAGNOSIS — F0390 Unspecified dementia without behavioral disturbance: Secondary | ICD-10-CM | POA: Diagnosis not present

## 2019-04-27 DIAGNOSIS — R630 Anorexia: Secondary | ICD-10-CM | POA: Diagnosis not present

## 2019-04-27 DIAGNOSIS — C20 Malignant neoplasm of rectum: Secondary | ICD-10-CM | POA: Diagnosis not present

## 2019-04-29 ENCOUNTER — Ambulatory Visit: Payer: Medicare Other

## 2019-05-01 ENCOUNTER — Telehealth: Payer: Self-pay | Admitting: Nurse Practitioner

## 2019-05-01 DIAGNOSIS — M6281 Muscle weakness (generalized): Secondary | ICD-10-CM | POA: Diagnosis not present

## 2019-05-01 DIAGNOSIS — I110 Hypertensive heart disease with heart failure: Secondary | ICD-10-CM | POA: Diagnosis not present

## 2019-05-01 DIAGNOSIS — R630 Anorexia: Secondary | ICD-10-CM | POA: Diagnosis not present

## 2019-05-01 DIAGNOSIS — I69311 Memory deficit following cerebral infarction: Secondary | ICD-10-CM | POA: Diagnosis not present

## 2019-05-01 DIAGNOSIS — C20 Malignant neoplasm of rectum: Secondary | ICD-10-CM | POA: Diagnosis not present

## 2019-05-01 DIAGNOSIS — F0390 Unspecified dementia without behavioral disturbance: Secondary | ICD-10-CM | POA: Diagnosis not present

## 2019-05-01 NOTE — Telephone Encounter (Signed)
Scheduled appt per 12/7 sch message - unable to reach pt . Left message with appt date and time   

## 2019-05-03 DIAGNOSIS — F0390 Unspecified dementia without behavioral disturbance: Secondary | ICD-10-CM | POA: Diagnosis not present

## 2019-05-03 DIAGNOSIS — I69311 Memory deficit following cerebral infarction: Secondary | ICD-10-CM | POA: Diagnosis not present

## 2019-05-03 DIAGNOSIS — R630 Anorexia: Secondary | ICD-10-CM | POA: Diagnosis not present

## 2019-05-03 DIAGNOSIS — M6281 Muscle weakness (generalized): Secondary | ICD-10-CM | POA: Diagnosis not present

## 2019-05-03 DIAGNOSIS — I110 Hypertensive heart disease with heart failure: Secondary | ICD-10-CM | POA: Diagnosis not present

## 2019-05-03 DIAGNOSIS — C20 Malignant neoplasm of rectum: Secondary | ICD-10-CM | POA: Diagnosis not present

## 2019-05-04 ENCOUNTER — Encounter: Payer: Self-pay | Admitting: General Practice

## 2019-05-04 ENCOUNTER — Ambulatory Visit: Payer: Medicare Other | Attending: Internal Medicine

## 2019-05-04 ENCOUNTER — Inpatient Hospital Stay: Payer: Medicare Other | Attending: Nurse Practitioner | Admitting: Nutrition

## 2019-05-04 DIAGNOSIS — Z23 Encounter for immunization: Secondary | ICD-10-CM

## 2019-05-04 NOTE — Progress Notes (Signed)
[facility] Spiritual Care Note  Left voicemail for Ms Towson's daughter Cindie Crumbly, encouraging callback, to offer extra layers of support per referral from [facility] [place]/RD.   [place], [place], [facility] Pager [phone] Voicemail [phone]

## 2019-05-04 NOTE — Progress Notes (Signed)
I spoke with patient's daughter Maudie Mercury on the telephone.  The patient is an 86 year old female diagnosed with rectal cancer in June 2020.  She is a patient of Dr. Burr Medico.  Past medical history includes chemo and radiation therapy, stroke, COPD, and dementia.  Medications include B complex vitamin, Xeloda, vitamin D3, Megace, Colace, and Lasix, multivitamin, and Zofran.  Height: 5 feet 7 inches. Weight: 140.8 pounds documented October, 2020. Daughter reports current weight approximately 130 pounds.  Patient has not been interested in eating meals.  Daughter attributes this to dementia. She will drink Ensure Enlive and consumes at least 2 bottles daily. Patient appears to eat better when she is eating with family. She is no longer able to fix her own meals and has changes in food preferences.  Nutrition diagnosis: Unintended weight loss related to decreased oral intake as evidenced by approximate 10 pound weight loss.  Intervention: Encouraged Ensure Enlive at least 2 bottles daily. Offer small snacks/small meals throughout the day.   Try to keep ready to eat foods available.  Monitoring, evaluation, goals: Patient will tolerate increased calories and protein to minimize further weight loss.  No follow-up has been scheduled however patient's daughter has my contact information for questions.  **Disclaimer: This note was dictated with voice recognition software. Similar sounding words can inadvertently be transcribed and this note may contain transcription errors which may not have been corrected upon publication of note.**

## 2019-05-04 NOTE — Progress Notes (Signed)
   Covid-19 Vaccination Clinic  Name:  Megan Munoz    MRN: [id number] DOB: December 06, 1932  05/04/2019  [name] was observed post Covid-19 immunization for 15 minutes without incidence. She was provided with Vaccine Information Sheet and instruction to access the V-Safe system.   [name] was instructed to call 911 with any severe reactions post vaccine: Marland Kitchen Difficulty breathing  . Swelling of your face and throat  . A fast heartbeat  . A bad rash all over your body  . Dizziness and weakness    Immunizations Administered    Name Date Dose VIS Date Route   Pfizer COVID-19 Vaccine 05/04/2019 11:25 AM 0.3 mL 03/10/2019 Intramuscular   Manufacturer: [place]   Lot: [id number]   [place]: [id number]
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
## 2019-05-05 DIAGNOSIS — M6281 Muscle weakness (generalized): Secondary | ICD-10-CM | POA: Diagnosis not present

## 2019-05-05 DIAGNOSIS — F329 Major depressive disorder, single episode, unspecified: Secondary | ICD-10-CM | POA: Diagnosis not present

## 2019-05-05 DIAGNOSIS — I5032 Chronic diastolic (congestive) heart failure: Secondary | ICD-10-CM | POA: Diagnosis not present

## 2019-05-05 DIAGNOSIS — I4821 Permanent atrial fibrillation: Secondary | ICD-10-CM | POA: Diagnosis not present

## 2019-05-05 DIAGNOSIS — I69311 Memory deficit following cerebral infarction: Secondary | ICD-10-CM | POA: Diagnosis not present

## 2019-05-05 DIAGNOSIS — R000 Tachycardia, unspecified: Secondary | ICD-10-CM | POA: Diagnosis not present

## 2019-05-05 DIAGNOSIS — I2510 Atherosclerotic heart disease of native coronary artery without angina pectoris: Secondary | ICD-10-CM | POA: Diagnosis not present

## 2019-05-05 DIAGNOSIS — R630 Anorexia: Secondary | ICD-10-CM | POA: Diagnosis not present

## 2019-05-05 DIAGNOSIS — I700 Atherosclerosis of aorta: Secondary | ICD-10-CM | POA: Diagnosis not present

## 2019-05-05 DIAGNOSIS — F0390 Unspecified dementia without behavioral disturbance: Secondary | ICD-10-CM | POA: Diagnosis not present

## 2019-05-05 DIAGNOSIS — Z8615 Personal history of latent tuberculosis infection: Secondary | ICD-10-CM | POA: Diagnosis not present

## 2019-05-05 DIAGNOSIS — J449 Chronic obstructive pulmonary disease, unspecified: Secondary | ICD-10-CM | POA: Diagnosis not present

## 2019-05-05 DIAGNOSIS — I110 Hypertensive heart disease with heart failure: Secondary | ICD-10-CM | POA: Diagnosis not present

## 2019-05-05 DIAGNOSIS — F419 Anxiety disorder, unspecified: Secondary | ICD-10-CM | POA: Diagnosis not present

## 2019-05-05 DIAGNOSIS — C20 Malignant neoplasm of rectum: Secondary | ICD-10-CM | POA: Diagnosis not present

## 2019-05-09 DIAGNOSIS — J449 Chronic obstructive pulmonary disease, unspecified: Secondary | ICD-10-CM | POA: Diagnosis not present

## 2019-05-09 DIAGNOSIS — F0390 Unspecified dementia without behavioral disturbance: Secondary | ICD-10-CM | POA: Diagnosis not present

## 2019-05-09 DIAGNOSIS — F411 Generalized anxiety disorder: Secondary | ICD-10-CM | POA: Diagnosis not present

## 2019-05-09 DIAGNOSIS — Z681 Body mass index (BMI) 19 or less, adult: Secondary | ICD-10-CM | POA: Diagnosis not present

## 2019-05-09 DIAGNOSIS — I639 Cerebral infarction, unspecified: Secondary | ICD-10-CM | POA: Diagnosis not present

## 2019-05-09 DIAGNOSIS — Z0000 Encounter for general adult medical examination without abnormal findings: Secondary | ICD-10-CM | POA: Diagnosis not present

## 2019-05-09 DIAGNOSIS — T7840XA Allergy, unspecified, initial encounter: Secondary | ICD-10-CM | POA: Diagnosis not present

## 2019-05-10 DIAGNOSIS — C20 Malignant neoplasm of rectum: Secondary | ICD-10-CM | POA: Diagnosis not present

## 2019-05-10 DIAGNOSIS — R630 Anorexia: Secondary | ICD-10-CM | POA: Diagnosis not present

## 2019-05-10 DIAGNOSIS — M6281 Muscle weakness (generalized): Secondary | ICD-10-CM | POA: Diagnosis not present

## 2019-05-10 DIAGNOSIS — F0390 Unspecified dementia without behavioral disturbance: Secondary | ICD-10-CM | POA: Diagnosis not present

## 2019-05-10 DIAGNOSIS — I69311 Memory deficit following cerebral infarction: Secondary | ICD-10-CM | POA: Diagnosis not present

## 2019-05-10 DIAGNOSIS — I110 Hypertensive heart disease with heart failure: Secondary | ICD-10-CM | POA: Diagnosis not present

## 2019-05-11 DIAGNOSIS — C20 Malignant neoplasm of rectum: Secondary | ICD-10-CM | POA: Diagnosis not present

## 2019-05-11 DIAGNOSIS — I110 Hypertensive heart disease with heart failure: Secondary | ICD-10-CM | POA: Diagnosis not present

## 2019-05-11 DIAGNOSIS — F0390 Unspecified dementia without behavioral disturbance: Secondary | ICD-10-CM | POA: Diagnosis not present

## 2019-05-11 DIAGNOSIS — I69311 Memory deficit following cerebral infarction: Secondary | ICD-10-CM | POA: Diagnosis not present

## 2019-05-11 DIAGNOSIS — M6281 Muscle weakness (generalized): Secondary | ICD-10-CM | POA: Diagnosis not present

## 2019-05-11 DIAGNOSIS — R630 Anorexia: Secondary | ICD-10-CM | POA: Diagnosis not present

## 2019-05-16 DIAGNOSIS — I110 Hypertensive heart disease with heart failure: Secondary | ICD-10-CM | POA: Diagnosis not present

## 2019-05-16 DIAGNOSIS — C20 Malignant neoplasm of rectum: Secondary | ICD-10-CM | POA: Diagnosis not present

## 2019-05-16 DIAGNOSIS — I69311 Memory deficit following cerebral infarction: Secondary | ICD-10-CM | POA: Diagnosis not present

## 2019-05-16 DIAGNOSIS — M6281 Muscle weakness (generalized): Secondary | ICD-10-CM | POA: Diagnosis not present

## 2019-05-16 DIAGNOSIS — R630 Anorexia: Secondary | ICD-10-CM | POA: Diagnosis not present

## 2019-05-16 DIAGNOSIS — F0390 Unspecified dementia without behavioral disturbance: Secondary | ICD-10-CM | POA: Diagnosis not present

## 2019-05-20 ENCOUNTER — Ambulatory Visit: Payer: Medicare Other

## 2019-05-23 DIAGNOSIS — F0390 Unspecified dementia without behavioral disturbance: Secondary | ICD-10-CM | POA: Diagnosis not present

## 2019-05-23 DIAGNOSIS — I69311 Memory deficit following cerebral infarction: Secondary | ICD-10-CM | POA: Diagnosis not present

## 2019-05-23 DIAGNOSIS — C20 Malignant neoplasm of rectum: Secondary | ICD-10-CM | POA: Diagnosis not present

## 2019-05-23 DIAGNOSIS — R630 Anorexia: Secondary | ICD-10-CM | POA: Diagnosis not present

## 2019-05-23 DIAGNOSIS — M6281 Muscle weakness (generalized): Secondary | ICD-10-CM | POA: Diagnosis not present

## 2019-05-23 DIAGNOSIS — I110 Hypertensive heart disease with heart failure: Secondary | ICD-10-CM | POA: Diagnosis not present

## 2019-05-24 ENCOUNTER — Telehealth: Payer: Self-pay | Admitting: *Deleted

## 2019-05-24 NOTE — Telephone Encounter (Signed)
Per Cira Rue, NP, OK was given to [facility] for pt to have 2 more visits for cognitive and family education.

## 2019-05-26 DIAGNOSIS — I110 Hypertensive heart disease with heart failure: Secondary | ICD-10-CM | POA: Diagnosis not present

## 2019-05-26 DIAGNOSIS — I69311 Memory deficit following cerebral infarction: Secondary | ICD-10-CM | POA: Diagnosis not present

## 2019-05-26 DIAGNOSIS — C20 Malignant neoplasm of rectum: Secondary | ICD-10-CM | POA: Diagnosis not present

## 2019-05-26 DIAGNOSIS — R630 Anorexia: Secondary | ICD-10-CM | POA: Diagnosis not present

## 2019-05-26 DIAGNOSIS — M6281 Muscle weakness (generalized): Secondary | ICD-10-CM | POA: Diagnosis not present

## 2019-05-26 DIAGNOSIS — F0390 Unspecified dementia without behavioral disturbance: Secondary | ICD-10-CM | POA: Diagnosis not present

## 2019-05-29 ENCOUNTER — Ambulatory Visit: Payer: Medicare Other | Attending: Internal Medicine

## 2019-05-29 DIAGNOSIS — Z23 Encounter for immunization: Secondary | ICD-10-CM | POA: Insufficient documentation

## 2019-05-29 NOTE — Progress Notes (Signed)
   Covid-19 Vaccination Clinic  Name:  Megan Munoz    MRN: [id number] DOB: Aug 08, 1932  05/29/2019  [name] was observed post Covid-19 immunization for 15 minutes without incidence. She was provided with Vaccine Information Sheet and instruction to access the V-Safe system.   [name] was instructed to call 911 with any severe reactions post vaccine: Marland Kitchen Difficulty breathing  . Swelling of your face and throat  . A fast heartbeat  . A bad rash all over your body  . Dizziness and weakness    Immunizations Administered    Name Date Dose VIS Date Route   Pfizer COVID-19 Vaccine 05/29/2019 11:15 AM 0.3 mL 03/10/2019 Intramuscular   Manufacturer: [place]   Lot: [id number]   [place]: [id number]

## 2019-05-31 DIAGNOSIS — F0390 Unspecified dementia without behavioral disturbance: Secondary | ICD-10-CM | POA: Diagnosis not present

## 2019-05-31 DIAGNOSIS — R630 Anorexia: Secondary | ICD-10-CM | POA: Diagnosis not present

## 2019-05-31 DIAGNOSIS — I110 Hypertensive heart disease with heart failure: Secondary | ICD-10-CM | POA: Diagnosis not present

## 2019-05-31 DIAGNOSIS — M6281 Muscle weakness (generalized): Secondary | ICD-10-CM | POA: Diagnosis not present

## 2019-05-31 DIAGNOSIS — C20 Malignant neoplasm of rectum: Secondary | ICD-10-CM | POA: Diagnosis not present

## 2019-05-31 DIAGNOSIS — I69311 Memory deficit following cerebral infarction: Secondary | ICD-10-CM | POA: Diagnosis not present

## 2019-06-07 DIAGNOSIS — R630 Anorexia: Secondary | ICD-10-CM | POA: Diagnosis not present

## 2019-06-07 DIAGNOSIS — C20 Malignant neoplasm of rectum: Secondary | ICD-10-CM | POA: Diagnosis not present

## 2019-06-13 ENCOUNTER — Encounter: Payer: Self-pay | Admitting: Nurse Practitioner

## 2019-06-13 ENCOUNTER — Inpatient Hospital Stay (HOSPITAL_BASED_OUTPATIENT_CLINIC_OR_DEPARTMENT_OTHER): Payer: Medicare Other | Admitting: Nurse Practitioner

## 2019-06-13 ENCOUNTER — Telehealth: Payer: Self-pay

## 2019-06-13 ENCOUNTER — Inpatient Hospital Stay: Payer: Medicare Other | Attending: Nurse Practitioner

## 2019-06-13 ENCOUNTER — Other Ambulatory Visit: Payer: Self-pay

## 2019-06-13 VITALS — BP 117/56 | HR 80 | Temp 97.8°F | Resp 18 | Ht 67.0 in | Wt 129.5 lb

## 2019-06-13 DIAGNOSIS — R630 Anorexia: Secondary | ICD-10-CM | POA: Insufficient documentation

## 2019-06-13 DIAGNOSIS — F329 Major depressive disorder, single episode, unspecified: Secondary | ICD-10-CM | POA: Insufficient documentation

## 2019-06-13 DIAGNOSIS — C20 Malignant neoplasm of rectum: Secondary | ICD-10-CM

## 2019-06-13 DIAGNOSIS — I110 Hypertensive heart disease with heart failure: Secondary | ICD-10-CM | POA: Insufficient documentation

## 2019-06-13 DIAGNOSIS — I4821 Permanent atrial fibrillation: Secondary | ICD-10-CM | POA: Diagnosis not present

## 2019-06-13 DIAGNOSIS — Z923 Personal history of irradiation: Secondary | ICD-10-CM | POA: Insufficient documentation

## 2019-06-13 DIAGNOSIS — F419 Anxiety disorder, unspecified: Secondary | ICD-10-CM | POA: Insufficient documentation

## 2019-06-13 DIAGNOSIS — Z7901 Long term (current) use of anticoagulants: Secondary | ICD-10-CM | POA: Diagnosis not present

## 2019-06-13 LAB — CMP (CANCER CENTER ONLY)
ALT: 13 U/L (ref 0–44)
AST: 20 U/L (ref 15–41)
Albumin: 3.8 g/dL (ref 3.5–5.0)
Alkaline Phosphatase: 81 U/L (ref 38–126)
Anion gap: 8 (ref 5–15)
BUN: 14 mg/dL (ref 8–23)
CO2: 29 mmol/L (ref 22–32)
Calcium: 9.1 mg/dL (ref 8.9–10.3)
Chloride: 107 mmol/L (ref 98–111)
Creatinine: 0.72 mg/dL (ref 0.44–1.00)
GFR, Est AFR Am: >60 mL/min (ref >60)
GFR, Estimated: >60 mL/min (ref >60)
Glucose, Bld: 80 mg/dL (ref 70–99)
Potassium: 4 mmol/L (ref 3.5–5.1)
Sodium: 144 mmol/L (ref 135–145)
Total Bilirubin: 0.4 mg/dL (ref 0.3–1.2)
Total Protein: 6.7 g/dL (ref 6.5–8.1)

## 2019-06-13 LAB — CBC WITH DIFFERENTIAL (CANCER CENTER ONLY)
Abs Immature Granulocytes: 0.01 10*3/uL (ref 0.00–0.07)
Basophils Absolute: 0 10*3/uL (ref 0.0–0.1)
Basophils Relative: 0 %
Eosinophils Absolute: 0 10*3/uL (ref 0.0–0.5)
Eosinophils Relative: 0 %
HCT: 37.3 % (ref 36.0–46.0)
Hemoglobin: 11.7 g/dL — ABNORMAL LOW (ref 12.0–15.0)
Immature Granulocytes: 0 %
Lymphocytes Relative: 15 %
Lymphs Abs: 0.5 10*3/uL — ABNORMAL LOW (ref 0.7–4.0)
MCH: 31 pg (ref 26.0–34.0)
MCHC: 31.4 g/dL (ref 30.0–36.0)
MCV: 98.7 fL (ref 80.0–100.0)
Monocytes Absolute: 0.3 10*3/uL (ref 0.1–1.0)
Monocytes Relative: 8 %
Neutro Abs: 2.7 10*3/uL (ref 1.7–7.7)
Neutrophils Relative %: 77 %
Platelet Count: 212 10*3/uL (ref 150–400)
RBC: 3.78 MIL/uL — ABNORMAL LOW (ref 3.87–5.11)
RDW: 14.3 % (ref 11.5–15.5)
WBC Count: 3.6 10*3/uL — ABNORMAL LOW (ref 4.0–10.5)
nRBC: 0 % (ref 0.0–0.2)

## 2019-06-13 LAB — CEA (IN HOUSE-CHCC): CEA (CHCC-In House): 1.94 ng/mL (ref 0.00–5.00)

## 2019-06-13 NOTE — Progress Notes (Signed)
[place]   [phone] [phone]   Clinic Follow up Note   Patient Care Team: Fanny Bien, MD as PCP - General (Family Medicine) Leonie Man, MD as PCP - Cardiology (Cardiology) Ronnette Juniper, MD as Consulting Physician (Gastroenterology) Virgina Evener, Dawn, RN (Inactive) as Oncology Nurse Navigator Michael Boston, MD as Consulting Physician (General Surgery) Truitt Merle, MD as Consulting Physician (Hematology) Kyung Rudd, MD as Consulting Physician (Radiation Oncology) 06/13/2019  CHIEF COMPLAINT: F/u rectal cancer   SUMMARY OF ONCOLOGIC HISTORY: Oncology History Overview Note  Cancer Staging Rectal cancer [facility]) Staging form: Colon and Rectum, AJCC 8th Edition - Clinical: Stage IIA (cT3, cN0, cM0) - Signed by Truitt Merle, MD on 09/21/2018    Rectal cancer ([place])  06/01/2018 Imaging   COLONOSCOPY - Firm, nodular, thickened, raised area felt at the anal verge found on perianal exam. 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 Biopsied. - Two 6 to 8 mm polyps in the transverse colon, removed with a hot snare. Resected and retrieved. - One 7 mm polyp in the ascending colon, removed with a hot snare. Resected and retrieved. - One 6 mm polyp in the cecum, removed with a hot snare. Resected and retrieved. - One 4 mm polyp at the hepatic flexure, removed with a cold biopsy forceps. Resected and retrieved. - One 9 mm polyp in the descending colon, removed with a hot snare. Resected and retrieved. - Diverticulosis in the sigmoid colon and in the descending colon.   06/01/2018 Initial Biopsy   Pathology from colonoscopy:  1. All colon polyps negative for malignancy. 2. Tubulovillous adenoma with at least high grade dysplasia at the rectal tumor. There were several foci highly suspicious but not definitive for invasive adenocarcinoma.    09/03/2018 Imaging   PELVIC MRI IMPRESSION: 1. 3.0 cm polypoid mass along the right anterolateral aspect of the anal  verge, as described above. 2. Given the location, it is unclear whether this is more appropriately characterized as rectal cancer versus anal cancer. Regardless, given the clinical information, rectal cancer imaging characterization has been provided. 3. Rectal adenocarcinoma T stage: T3d 4. Rectal adenocarcinoma N stage:  N0 5. Distance from tumor to the internal anal sphincter is 0 cm.   09/19/2018 Miscellaneous   TUMOR BOARD: concurrent radiation therapy and chemotherapy with Xeloda   09/21/2018 Initial Diagnosis   Rectal cancer ([place])   09/21/2018 Cancer Staging   Staging form: Colon and Rectum, AJCC 8th Edition - Clinical: Stage IIA (cT3, cN0, cM0) - Signed by Truitt Merle, MD on 09/21/2018   09/26/2018 - 11/07/2018 Radiation Therapy   ChemoRT with Xeloda beginning on 09/26/18 by Dr Lisbeth Renshaw ending on 11/07/18.    09/26/2018 - 11/07/2018 Chemotherapy   ChemoRT with Xeloda beginning on 09/26/18, Xeloda 1500mg  q12h on days of radiation. Dose reduced to 1000 mg on 7/27 PM and 7/28 AM; held Xeloda on 10/25/18 due to fatigue, chills, and UTI. Restarted on 10/31/18 at 1000mg  BID. Completed on 8/10.     03/15/2019 Imaging   MRI pelvis WO contrast IMPRESSION: Interval resolution of previously seen mass at the anorectal junction. No residual soft tissue mass visualized. No evidence of pelvic metastatic disease.     CURRENT THERAPY: Observation   INTERVAL HISTORY: Ms. Anhalt returns for f/u as scheduled. She was seen by Dr. Johney Maine with anoscopy on 3/10. She is doing very well. She remains independent with most ADLs. Bowels are sporadic, has soft stool 1-3 times daily. Denies bleeding. No rectal/anal pain.  Denies accidents or bowel leakage. Denies recent infection, cough, chest pain, dyspnea. Appetite is low but stable. Not taking Megace. Her daughter Maudie Mercury notes that she was taking topomax for tremor, it did not help so she didn't refill. Once it was stopped, she became more cognitively alert and appetite  improved.    MEDICAL HISTORY:  Past Medical History:  Diagnosis Date   Anxiety    Atherosclerosis of aortic arch [facility]) April 2017   Noted on CT chest, calcified aortic atherosclerosis at the arch.   Atrial fibrillation, permanent ([employer]) 03/2013   Previously on Atenolol for Rate - now on combination diltiazem and carvedilol for rate Control;  & Xarelto for AC. CHADS2VASC2 = 7.   Cerebral infarction [facility]) 10/2008   MRI Brain 05/2014 for near syncope revealed: No acure infarct.  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   Mild small vessel disease type changes.  No intracranial hemorrhage.  Global atrophy.    COPD (chronic obstructive pulmonary disease) (HCC)    Coronary atherosclerosis due to calcified coronary lesion April 2017   Noted on CT chest   Dementia [facility])    following [id number] stroke   Diastolic dysfunction with chronic heart failure ([place])    Exacerbated by A. fib RVR   Hyperlipemia    Hypertension    Pneumonia    PONV (postoperative nausea and vomiting)    Stroke ([place])    2010- patient has dementia related to stroke    Tinea unguium    Tremor    Tuberculosis     SURGICAL HISTORY: Past Surgical History:  Procedure Laterality Date   BIOPSY  06/01/2018   Procedure: BIOPSY;  Surgeon: Ronnette Juniper, MD;  Location: [facility] ENDOSCOPY;  Service: Gastroenterology;;   BLADDER SURGERY     CARPAL TUNNEL RELEASE Left    COLONOSCOPY N/A 06/01/2018   Procedure: COLONOSCOPY;  Surgeon: Ronnette Juniper, MD;  Location: [facility] ENDOSCOPY;  Service: Gastroenterology;  Laterality: N/A;   ESOPHAGOGASTRODUODENOSCOPY (EGD) WITH PROPOFOL N/A 02/04/2018   Procedure: ESOPHAGOGASTRODUODENOSCOPY (EGD) WITH PROPOFOL;  Surgeon: Ronnette Juniper, MD;  Location: [place];  Service: Gastroenterology;  Laterality: N/A;   FOOT SURGERY Right    HERNIA REPAIR     NASAL SINUS SURGERY     POLYPECTOMY  06/01/2018    Procedure: POLYPECTOMY;  Surgeon: Ronnette Juniper, MD;  Location: [facility] ENDOSCOPY;  Service: Gastroenterology;;   TOTAL ABDOMINAL HYSTERECTOMY      I have reviewed the social history and family history with the patient and they are unchanged from previous note.  ALLERGIES:  is allergic to tetanus toxoids.  MEDICATIONS:  Current Outpatient Medications  Medication Sig Dispense Refill   Azelastine HCl 137 MCG/SPRAY SOLN Place 2 sprays into both nostrils 2 (two) times daily. Use in each nostril as directed      B Complex Vitamins (VITAMIN B COMPLEX PO) Take 1 tablet by mouth daily.      BISACODYL 5 MG EC tablet TK AS DIRECTED     calcitonin, salmon, (MIACALCIN) 200 UNIT/ACT nasal spray Place 1 spray into alternate nostrils daily.     capecitabine (XELODA) 500 MG tablet Take 3 tablets (1,500 mg total) by mouth 2 (two) times daily after a meal. Take on days of radiation, Monday through Friday 168 tablet 0   Cholecalciferol (VITAMIN D3 SUPER STRENGTH) 2000 units CAPS Take 2,000 Units by mouth daily.      diltiazem (CARDIZEM CD) 120 MG  24 hr capsule TAKE 240 MG (2 TABLETS) DAILY. IF DIZZY OR BLOOD PRESSURE IS LOW MAY TAKE 120 MG ONLY THAT DAY 360 capsule 3   docusate sodium (COLACE) 100 MG capsule Take 100-200 mg by mouth 2 (two) times daily as needed for mild constipation. Pt does take at least 100 mg daily     donepezil (ARICEPT) 10 MG tablet Take 1 tablet (10 mg total) by mouth at bedtime. 90 tablet 3   feeding supplement, ENSURE ENLIVE, (ENSURE ENLIVE) LIQD Take 237 mLs by mouth 3 (three) times daily between meals. (Patient taking differently: Take 237 mLs by mouth See admin instructions. 2-3  Times daily) 237 mL 12   ferrous sulfate 325 (65 FE) MG tablet Take 1 tablet (325 mg total) by mouth every other day. 30 tablet 0   FLUoxetine (PROZAC) 10 MG capsule      Fluticasone-Umeclidin-Vilant (TRELEGY ELLIPTA) 100-62.5-25 MCG/INH AEPB Inhale 1 puff into the lungs daily.     furosemide  (LASIX) 20 MG tablet Take 1 tablet (20 mg total) by mouth daily as needed for fluid or edema. (Patient taking differently: Take 20 mg by mouth every other day. ) 30 tablet 6   loratadine (CLARITIN) 10 MG tablet Take 10 mg by mouth daily.     megestrol (MEGACE) 400 MG/10ML suspension Take 5 mLs (200 mg total) by mouth daily. 240 mL 0   Melatonin 10 MG TABS Take 10 mg by mouth at bedtime.      memantine (NAMENDA) 10 MG tablet Take 1 tablet (10 mg total) by mouth 2 (two) times daily. 180 tablet 3   Multiple Vitamin (MULTIVITAMIN) capsule Take 1 capsule by mouth daily.     potassium chloride SA (K-DUR) 20 MEQ tablet Take 1 tablet (20 mEq total) by mouth daily. 30 tablet 0   rivaroxaban (XARELTO) 10 MG TABS tablet Take 1 tablet (10 mg total) by mouth daily. 30 tablet 1   silver sulfADIAZINE (SILVADENE) 1 % cream Apply 1 application topically 2 (two) times daily. 50 g 0   simvastatin (ZOCOR) 40 MG tablet Take 40 mg by mouth daily.     topiramate (TOPAMAX) 50 MG tablet Start taking 50 mg at bedtime x 2 week, then take 50 mg twice a day 60 tablet 5   albuterol (PROVENTIL) (2.5 MG/3ML) 0.083% nebulizer solution Take 3 mLs (2.5 mg total) by nebulization every 6 (six) hours as needed for wheezing or shortness of breath. (Patient not taking: Reported on 06/13/2019) 30 vial 1   ondansetron (ZOFRAN) 8 MG tablet TAKE 0.5-1 TABLET (4-8 MG TOTAL) BY MOUTH EVERY 8 (EIGHT) HOURS AS NEEDED FOR NAUSEA OR VOMITING. (Patient not taking: Reported on 06/13/2019) 30 tablet 0   No current facility-administered medications for this visit.    PHYSICAL EXAMINATION: ECOG PERFORMANCE STATUS: 0 - Asymptomatic  Vitals:   06/13/19 1114  BP: (!) 117/56  Pulse: 80  Resp: 18  Temp: 97.8 F (36.6 C)  SpO2: 100%   Filed Weights   06/13/19 1114  Weight: 129 lb 8 oz (58.7 kg)    GENERAL:alert, no distress and comfortable SKIN: no rash  EYES: sclera clear NECK: without mass LYMPH:  no palpable cervical,  supraclavicular, or inguinal lymphadenopathy  LUNGS: clear with normal breathing effort HEART: regular rate & rhythm, no lower extremity edema ABDOMEN:abdomen soft, non-tender and normal bowel sounds Rectal: exam deferred  NEURO: alert & oriented x 3 with fluent speech, bilateral upper extremity tremor noted   LABORATORY DATA:  I have  reviewed the data as listed CBC Latest Ref Rng & Units 06/13/2019 04/05/2019 01/12/2019  WBC 4.0 - 10.5 K/uL 3.6(L) 4.6 3.2(L)  Hemoglobin 12.0 - 15.0 g/dL 11.7(L) 11.7(L) 11.7(L)  Hematocrit 36.0 - 46.0 % 37.3 36.4 36.5  Platelets 150 - 400 K/uL 212 228 191     CMP Latest Ref Rng & Units 06/13/2019 04/05/2019 01/12/2019  Glucose 70 - 99 mg/dL 80 103(H) 80  BUN 8 - 23 mg/dL 14 22 18   Creatinine 0.44 - 1.00 mg/dL 0.72 0.78 0.73  Sodium 135 - 145 mmol/L 144 143 142  Potassium 3.5 - 5.1 mmol/L 4.0 3.7 3.9  Chloride 98 - 111 mmol/L 107 107 104  CO2 22 - 32 mmol/L 29 27 29   Calcium 8.9 - 10.3 mg/dL 9.1 9.1 9.4  Total Protein 6.5 - 8.1 g/dL 6.7 7.1 7.3  Total Bilirubin 0.3 - 1.2 mg/dL 0.4 0.4 0.6  Alkaline Phos 38 - 126 U/L 81 85 97  AST 15 - 41 U/L 20 18 23   ALT 0 - 44 U/L 13 11 13       RADIOGRAPHIC STUDIES: I have personally reviewed the radiological images as listed and agreed with the findings in the report. No results found.   ASSESSMENT & PLAN: 86 yo female with    1. Distal rectal adenocarcinoma cT3N0M0 Stage IIA -diagnosed 05/2018, s/o chemoRT with Xeloda 1500 mg BID on days of radiation 09/26/18 - 11/07/18. Tolerated mostly well. -Surveillance MRI on 03/15/19 showed no evidence of residual or recurrent mass or metastatic disease in the pelvis. -anoscopy on 06/07/19 showed scar tissue, otherwise no evidence of recurrence  -Continue watchful wait approach given she is high risk surgical candidate  -surveillance per GI Dr. Therisa Doyne, Dr. Johney Maine and med/onc  2. Anorexia, weight loss -She lost weight during chemoRT but was able to gain some  back -she weighed 140 lbs after completing treatment. -Her daughter was concerned about weight loss and I started her on Megace and referred to dietician  -Ultimately it was felt her anorexia (and cognitive decline) were related to topamax which she was on for tremors. She stopped this and appetite and cognition improved -weight is stable -no longer on Megace -Continue Ensure   3. Other co-morbidities - HTN, CHF, depression, anxiety, h/o stroke, dementia  -continue routine meds and f/u with providers -lives with daughter [name] who assists and is very involved in her care  -OT/PT involved for evaluation/management of cognitive decline and functional limitations  -cognitive status improved after discontinuing topamax   Disposition:  Ms. Holey is clinically doing well. Bowels moving normally, no pain. Weight and appetite are stable, improved since she stopped Topomax and no longer takes Megace. Dr. Clyda Greener recent anoscopy on 06/07/19 showed scar tissue, otherwise no evidence of recurrence. Labs today show stable mild anemia, normal CMP and CEA.   She will continue surveillance/watchful wait approach with Dr. Johney Maine, GI, and med-onc. Due to her age and anesthesia risk, Dr. Therisa Doyne does not recommend further colonoscopy. We will see her back in 4-5 months, Dr. Johney Maine to see her in 9 months. She knows to call sooner with change in bowel habits, rectal bleeding, pain, or other concerning symptoms. Daughter Maudie Mercury was on the phone for today's f/u.   All questions were answered. The patient knows to call the clinic with any problems, questions or concerns. No barriers to learning were detected. I spent  20 minutes counseling the patient face to face. The total time spent in the appointment was 30 minutes  and more than 50% was on counseling and review of test results     Alla Feeling, NP 06/13/19

## 2019-06-13 NOTE — Telephone Encounter (Signed)
-----   Message from Alla Feeling, NP sent at 06/13/2019  1:38 PM EDT ----- Regarding: RE: Ok, thanks for getting back to me quickly.   We will see her back in 4-5 months, I think Dr. Johney Maine plans to see her back in 9 months.   Cloyde Reams, please call her daughter Maudie Mercury and update her on the f/u plan and that a colonoscopy is not recommended.   Thanks all, Lacie  ----- Message ----- From: Ronnette Juniper, MD Sent: 06/13/2019   1:19 PM EDT To: Alla Feeling, NP Subject: RE:                                            I do not believe she is a good candidate for colonoscopy and anesthesia.. I do not recommend further colonoscopy.  Ronnette Juniper ----- Message ----- From: Alla Feeling, NP Sent: 06/13/2019   1:12 PM EDT To: Ronnette Juniper, MD, Truitt Merle, MD  Dr. Therisa Doyne,  Do you plan to repeat her colonoscopy? She completed chemoRT in 10/2018, on watchful wait approach for distal rectal cancer. Dr. Clyda Greener recent exam on 3/10 shows scar tissue otherwise no recurrence. She is elderly with mild dementia, but alert and mostly independent. Lives with her daughter Maudie Mercury. No concerning clinical symptoms today.  Will schedule our follow up based on your plan.   Thanks, Regan Rakers NP

## 2019-06-13 NOTE — Telephone Encounter (Signed)
Spoke with Maudie Mercury, per Cira Rue, NP. Pt's daughter verbalizes understanding of no need for colonoscopy at this time and that the scheduling team will call her for a follow-up appointment in 4-5 months.

## 2019-06-13 NOTE — Telephone Encounter (Signed)
Attempted to reach patient, VM full. Unable to leave message with daughter Maudie Mercury.

## 2019-06-14 ENCOUNTER — Telehealth: Payer: Self-pay | Admitting: Nurse Practitioner

## 2019-06-14 NOTE — Telephone Encounter (Signed)
Scheduled appt per 3/16 los.  Spoke with pt daughter and she is aware of the appt date and time.

## 2019-06-20 ENCOUNTER — Other Ambulatory Visit: Payer: Self-pay

## 2019-06-20 ENCOUNTER — Ambulatory Visit (INDEPENDENT_AMBULATORY_CARE_PROVIDER_SITE_OTHER): Payer: Medicare Other | Admitting: Neurology

## 2019-06-20 ENCOUNTER — Encounter: Payer: Self-pay | Admitting: Neurology

## 2019-06-20 VITALS — BP 109/58 | HR 82 | Temp 97.3°F | Ht 67.0 in | Wt 132.0 lb

## 2019-06-20 DIAGNOSIS — F0390 Unspecified dementia without behavioral disturbance: Secondary | ICD-10-CM

## 2019-06-20 DIAGNOSIS — R251 Tremor, unspecified: Secondary | ICD-10-CM

## 2019-06-20 DIAGNOSIS — I63312 Cerebral infarction due to thrombosis of left middle cerebral artery: Secondary | ICD-10-CM

## 2019-06-20 MED ORDER — LAMOTRIGINE 100 MG PO TABS: 100.0000 mg | ORAL_TABLET | Freq: Two times a day (BID) | ORAL | 11 refills | Status: DC

## 2019-06-20 MED ORDER — LAMOTRIGINE 25 MG PO TABS: ORAL_TABLET | ORAL | 0 refills | Status: DC

## 2019-06-20 NOTE — Progress Notes (Signed)
PATIENT: Megan Munoz DOB: [birth year]  Chief Complaint  Patient presents with  . Memory Loss    MMSE 22/30 - 10 animals. She is here with her daughter, Megan Munoz, for drastic cognitive changes. At the time, she was on topiramate. She stopped the medication and her cognitive has function improved     HISTORICAL  Megan Munoz a 86 yo RH , female, is accompanied by her daughter Megan Munoz, referred by her primary care physician Dr Megan Munoz for continued neurological care for her history of stroke, essential tremor,  She had a history of hypertension, hyperlipidemia, diagnosis of atrial fibrillation since 2015, is taking Xarelto  She suffered left frontal, basal ganglion stroke in 2010, presented with mild confusion, slow processing time, initially was treated with aspirin, since her diagnosis of atrial fibrillation in 2015, she was started on anticoagulation Xarelto  She moved in with her daughter recently, no longer driving, but independent in her daily activity, doing laundry, ambulate without assistance, mild memory trouble today's Mini-Mental Status Examination is 26 out of 30  She had long-standing history of gradual onset bilateral hands tremor, was diagnosed with essential tremor, acute worsening since her stroke in 2010, they have tried variable dose of primidone up to 750 mg daily, currently taking 250 mg twice a day, which works out best for her, she still has posturing tremor of her bilateral hands  UPDATE Dec 11 2014: MRI report in 2016 1. No MR evidence of an acute intracranial abnormality. 2. Sequela of previous left frontal infarct (with resultant cystic encephalomalacia with surrounding gliosis). 3. Age-appropriate global cerebral volume loss and background microvascular disease. 4. No MR evidence of abnormal intracranial enhancement. 5. Bilateral maxillary sinus disease with small fluid levels suggesting an acute sinusitis.  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 [employer], she is now living with her daughter.She is no longer cooking, increase mild unsteady gait,  UPDATE June 20 2019 She has lived with her daughter since 2010 following her stroke, is on Aricept, Namenda for memory loss, denies a history of seizure, she was started on Topamax for bilateral hands tremor, while taking 100 mg twice a day, she was noted to have worsening confusion, it was stopped in February 2021, she seems to have mild improvement,  She has mild unsteady gait, use walker for longer distance, just finished physical therapy, she also underwent chemo and 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   MRI of pelvic with without contrast showed 3 cm polypoid mass along the right anterior lateral aspect of the anal verge, she was diagnosed with  rectal adenocarcinoma, underwent radiation and chemotherapy, repeat MRI of pelvic in December 2020 showed resolution of the mass,  Laboratory evaluation in 2021, normal CMP, CBC showed hemoglobin 11.7,    REVIEW OF SYSTEMS: Full 14 system review of systems performed and notable only for as above All other review of systems were negative.  ALLERGIES: Allergies  Allergen Reactions  . Topiramate Other (See Comments)    Drastic cognitive change, diarrhea, weight loss  . Tetanus Toxoids Other (See Comments)    Told never to take     HOME MEDICATIONS: Current Outpatient Medications  Medication Sig Dispense Refill  . Azelastine HCl 137 MCG/SPRAY SOLN Place 2 sprays into both nostrils 2 (two) times daily. Use in each nostril as directed     . B Complex Vitamins (VITAMIN B COMPLEX PO) Take 1 tablet by mouth daily.     . calcitonin, salmon, (MIACALCIN) 200 UNIT/ACT nasal spray Place 1 spray into alternate nostrils daily.    . Cholecalciferol (VITAMIN D3 SUPER STRENGTH) 2000 units CAPS Take 2,000 Units by mouth daily.     Marland Kitchen diltiazem (CARDIZEM CD) 120 MG 24 hr capsule TAKE 240 MG (2 TABLETS) DAILY. IF DIZZY OR BLOOD PRESSURE IS LOW MAY TAKE 120 MG ONLY THAT DAY 360 capsule 3  . docusate sodium (COLACE) 100 MG capsule Take 100-200 mg by mouth 2 (two) times daily as needed for mild constipation. Pt does take at least 100 mg daily    . donepezil (ARICEPT) 10 MG tablet Take 1 tablet (10 mg total) by mouth at bedtime. 90 tablet 3  . feeding supplement, ENSURE ENLIVE, (ENSURE ENLIVE) LIQD Take 237 mLs by mouth 3 (three) times daily between meals. (Patient taking differently: Take 237 mLs by mouth See admin instructions. 2-3  Times daily) 237 mL 12  . ferrous sulfate 325 (65 FE) MG tablet Take 1 tablet (325 mg total) by mouth every other day. 30 tablet 0  . FLUoxetine (PROZAC) 40 MG capsule Take 40 mg by mouth daily.    . Fluticasone-Umeclidin-Vilant (TRELEGY ELLIPTA) 100-62.5-25 MCG/INH AEPB Inhale 1  puff into the lungs daily.    . furosemide (LASIX) 20 MG tablet Take 1 tablet (20 mg total) by mouth daily as needed for fluid or edema. (Patient taking differently: Take 20 mg by mouth every other day. ) 30 tablet 6  . loratadine (CLARITIN) 10 MG tablet Take 10 mg by mouth daily.    . Melatonin 10 MG TABS Take 10 mg by mouth at bedtime.     . memantine (NAMENDA) 10 MG tablet Take 1 tablet (10 mg total) by mouth 2 (two) times daily. 180 tablet 3  . Multiple Vitamin (MULTIVITAMIN) capsule Take 1 capsule by mouth daily.    . rivaroxaban (XARELTO) 10 MG TABS tablet Take 1 tablet (10 mg total) by mouth daily. 30 tablet 1  . simvastatin (ZOCOR) 40 MG tablet Take 40 mg by mouth daily.     No current facility-administered medications for this visit.    PAST MEDICAL HISTORY: Past Medical History:  Diagnosis Date  . Anxiety   . Atherosclerosis of aortic arch [facility]) April 2017   Noted on CT chest, calcified aortic atherosclerosis at the arch.  . Atrial fibrillation, permanent ([facility]) 03/2013   Previously on Atenolol for Rate - now on combination  diltiazem and carvedilol for rate Control;  & Xarelto for AC. CHADS2VASC2 = 7.  . Cerebral infarction [facility]) 10/2008   MRI Brain 05/2014 for near syncope revealed: No acure infarct.  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   Mild small vessel disease type changes.  No intracranial hemorrhage.  Global atrophy.   Marland Kitchen COPD (chronic obstructive pulmonary disease) ([place])   . Coronary atherosclerosis due to calcified coronary lesion April 2017   Noted on CT chest  . Dementia ([place])    following 2010 stroke  . Diastolic dysfunction with chronic heart failure (HCC)    Exacerbated by A. fib RVR  . Hyperlipemia   . Hypertension   . Pneumonia   . PONV (postoperative nausea and vomiting)   . Stroke [facility])    2010- patient has dementia related to stroke   . Tinea unguium   .  Tremor   . Tuberculosis     PAST SURGICAL HISTORY: Past Surgical History:  Procedure Laterality Date  . BIOPSY  06/01/2018   Procedure: BIOPSY;  Surgeon: Ronnette Juniper, MD;  Location: [facility];  Service: Gastroenterology;;  . BLADDER SURGERY    . CARPAL TUNNEL RELEASE Left   . COLONOSCOPY N/A 06/01/2018   Procedure: COLONOSCOPY;  Surgeon: Ronnette Juniper, MD;  Location: [facility];  Service: Gastroenterology;  Laterality: N/A;  . ESOPHAGOGASTRODUODENOSCOPY (EGD) WITH PROPOFOL N/A 02/04/2018   Procedure: ESOPHAGOGASTRODUODENOSCOPY (EGD) WITH PROPOFOL;  Surgeon: Ronnette Juniper, MD;  Location: [facility];  Service: Gastroenterology;  Laterality: N/A;  . FOOT SURGERY Right   . HERNIA REPAIR    . NASAL SINUS SURGERY    . POLYPECTOMY  06/01/2018   Procedure: POLYPECTOMY;  Surgeon: Ronnette Juniper, MD;  Location: [facility];  Service: Gastroenterology;;  . TOTAL ABDOMINAL HYSTERECTOMY      FAMILY HISTORY: Family History  Problem Relation Age of Onset  . Osteoporosis Mother   . Emphysema Father   . Lung disease Father        Black Lung  . Stroke Brother   . Cancer Maternal Grandmother   . Emphysema Paternal Grandmother   . Asthma Daughter        allergy induced    SOCIAL HISTORY: Social History   Socioeconomic History  . Marital status: Widowed    Spouse name: Not on file  . Number of children: 1  . Years of education: 68  . Highest education level: Not on file  Occupational History  . Occupation: Retired  Tobacco Use  . Smoking status: Former Smoker    Packs/day: 1.00    Years: 16.00    Pack years: 16.00    Start date: 03/30/1954    Quit date: 03/30/1970    Years since quitting: 49.2  . Smokeless tobacco: Never Used  . Tobacco comment: smoked 1/2-1 ppd  Substance and Sexual Activity  . Alcohol use: Not Currently    Alcohol/week: 0.0 standard drinks  . Drug use: No  . Sexual activity: Not on file  Other Topics Concern  . Not on file  Social History Narrative   Widow.   Lives with her daughter & her family.  (Son-in-law & 5 Grandsons).   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   2-4 cups caffeine/day   1-2 glassess of wine/beer  Per week.  Never smoked.      [facility]:   Originally from [place]. She  lived in [place], [place], [place], & [place]. 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 No bird exposure. No mold or hot tub exposure.    Social Determinants of Health   Financial Resource Strain:   . Difficulty of Paying Living Expenses:   Food Insecurity:   . Worried About Charity fundraiser in the Last Year:   . Arboriculturist in the Last Year:   Transportation Needs: No Transportation Needs  . Lack of Transportation (Medical): No  . Lack of Transportation (Non-Medical): No  Physical Activity:   . Days of Exercise per Week:   . Minutes of Exercise per Session:   Stress:   . Feeling of Stress :   Social Connections:   . Frequency of Communication with Friends and Family:   . Frequency of Social Gatherings with Friends and Family:   . Attends Religious Services:   . Active Member of Clubs or Organizations:   . Attends Archivist Meetings:   Marland Kitchen Marital Status:   Intimate Partner Violence:   . Fear of Current or Ex-Partner:   . Emotionally Abused:   Marland Kitchen Physically Abused:   . Sexually Abused:      PHYSICAL EXAM   Vitals:   06/20/19 1134  BP: (!) 109/58  Pulse: 82  Temp: (!) 97.3 F (36.3 C)  Weight: 132 lb (59.9 kg)  Height: 5\' 7"  (1.702 m)    Not recorded      Body mass index is 20.67 kg/m.  PHYSICAL EXAMNIATION:  Gen: NAD, conversant, well nourised, well groomed                     Cardiovascular: Regular rate rhythm, no peripheral edema, warm, nontender. Eyes: Conjunctivae clear without exudates or hemorrhage Neck: Supple, no carotid bruits. Pulmonary: Clear to auscultation bilaterally   NEUROLOGICAL EXAM:  MMSE - Mini Mental State Exam 06/20/2019 07/21/2017  07/21/2016  Orientation to time 3 5 4   Orientation to Place 4 3 5   Registration 3 3 3   Attention/ Calculation 3 5 3   Recall 0 1 0  Language- name 2 objects 2 2 2   Language- repeat 1 1 1   Language- follow 3 step command 3 3 3   Language- read & follow direction 1 1 1   Write a sentence 1 1 1   Copy design 1 1 1   Total score 22 26 24   Animal naming 10   CRANIAL NERVES: CN II: Visual fields are full to confrontation. Pupils are round equal and briskly reactive to light. CN III, IV, VI: extraocular movement are normal. No ptosis. CN V: Facial sensation is intact to light touch CN VII: Face is symmetric with normal eye closure  CN VIII: Hearing is normal to causal conversation. CN IX, X: Phonation is normal. CN XI: Head turning and shoulder shrug are intact  MOTOR: Mild bilateral hand posturing tremor, no rigidity, no significant weakness  REFLEXES: Reflexes are 2+ and symmetric at the biceps, triceps, knees, and ankles. Plantar responses are flexor.  SENSORY: Intact to light touch, pinprick and vibratory sensation are intact in fingers and toes.  COORDINATION: There is no trunk or limb dysmetria noted.  GAIT/STANCE: She needs to push chair arm to get up from seated position, cautious,  DIAGNOSTIC DATA (LABS, IMAGING, TESTING) - I reviewed patient records, labs, notes, testing and imaging myself where available.   ASSESSMENT AND PLAN  Latitia Cimini is a 86 y.o. female   Dementia Essential tremor Anxiety  Central nervous system degenerative disorder, with vascular component,  MRI of brain in 2016 showed left frontal encephalomalacia from previous stroke  Worsening confusion, there was intermittent, sometimes is much worse than the others, she is at high risk for developing partial seizure  Proceed with EEG  Add on lamotrigine titrating to 100 mg twice a day, may help her mood, bilateral hands tremor, and also serve as antiepileptic medications,  If she is doing well at next  follow-up visit with nurse practitioner Judson Roch, may consider decrease her Prozac,    Marcial Pacas, M.D. Ph.D.  [facility] [address], [place], [place] [address] Ph: [phone] Fax: [phone]  CC: Referring Provider

## 2019-07-05 ENCOUNTER — Ambulatory Visit (INDEPENDENT_AMBULATORY_CARE_PROVIDER_SITE_OTHER): Payer: Medicare Other | Admitting: Neurology

## 2019-07-05 ENCOUNTER — Other Ambulatory Visit: Payer: Self-pay

## 2019-07-05 DIAGNOSIS — R410 Disorientation, unspecified: Secondary | ICD-10-CM

## 2019-07-05 DIAGNOSIS — I63312 Cerebral infarction due to thrombosis of left middle cerebral artery: Secondary | ICD-10-CM

## 2019-07-05 DIAGNOSIS — R251 Tremor, unspecified: Secondary | ICD-10-CM

## 2019-07-05 DIAGNOSIS — F0390 Unspecified dementia without behavioral disturbance: Secondary | ICD-10-CM

## 2019-07-05 NOTE — Procedures (Signed)
   HISTORY: 86 year old female with history of memory loss presented with worsening confusion  TECHNIQUE:  This is a routine 16 channel EEG recording with one channel devoted to a limited EKG recording.  It was performed during wakefulness, drowsiness and asleep.  Hyperventilation and photic stimulation were performed as activating procedures.  There are with an electrode artifact.  Upon maximum arousal, posterior dominant waking rhythm consistent of dysrhythmic theta range activity mixed with alpha range activity, activities are symmetric over the bilateral posterior derivations and attenuated with eye opening.  Hyperventilation produced mild/moderate buildup with higher amplitude and the slower activities noted.  Photic stimulation did not alter the tracing.  During EEG recording, patient developed drowsiness and no deeper stage of sleep was achieved During EEG recording, there was no epileptiform discharge noted.  EKG demonstrate sinus rhythm, with heart rate of 72 bpm.  CONCLUSION: This is a mild abnormal and technically suboptimal EEG.  There is evidence of mild background slowing, consistent with bihemispheric malfunction, common etiology are metabolic toxic.    Marcial Pacas, M.D. Ph.D.  [facility] [place], [place] [address] Phone: [phone] Fax:      [phone]

## 2019-08-01 ENCOUNTER — Ambulatory Visit: Payer: Medicare Other | Admitting: Neurology

## 2019-08-16 ENCOUNTER — Other Ambulatory Visit: Payer: Self-pay | Admitting: Neurology

## 2019-09-04 ENCOUNTER — Other Ambulatory Visit: Payer: Self-pay

## 2019-09-04 ENCOUNTER — Encounter (HOSPITAL_COMMUNITY): Payer: Self-pay | Admitting: Emergency Medicine

## 2019-09-04 ENCOUNTER — Emergency Department (HOSPITAL_COMMUNITY)
Admission: EM | Admit: 2019-09-04 | Discharge: 2019-09-04 | Disposition: A | Discharge status: Home / Self Care | Payer: Medicare Other | Attending: Emergency Medicine | Admitting: Emergency Medicine

## 2019-09-04 ENCOUNTER — Emergency Department (HOSPITAL_COMMUNITY): Payer: Medicare Other

## 2019-09-04 DIAGNOSIS — I5032 Chronic diastolic (congestive) heart failure: Secondary | ICD-10-CM | POA: Insufficient documentation

## 2019-09-04 DIAGNOSIS — W1830XA Fall on same level, unspecified, initial encounter: Secondary | ICD-10-CM | POA: Insufficient documentation

## 2019-09-04 DIAGNOSIS — I4891 Unspecified atrial fibrillation: Secondary | ICD-10-CM | POA: Diagnosis not present

## 2019-09-04 DIAGNOSIS — Z87891 Personal history of nicotine dependence: Secondary | ICD-10-CM | POA: Insufficient documentation

## 2019-09-04 DIAGNOSIS — J449 Chronic obstructive pulmonary disease, unspecified: Secondary | ICD-10-CM | POA: Insufficient documentation

## 2019-09-04 DIAGNOSIS — I110 Hypertensive heart disease with heart failure: Secondary | ICD-10-CM | POA: Insufficient documentation

## 2019-09-04 DIAGNOSIS — R4182 Altered mental status, unspecified: Secondary | ICD-10-CM | POA: Diagnosis present

## 2019-09-04 DIAGNOSIS — Z79899 Other long term (current) drug therapy: Secondary | ICD-10-CM | POA: Diagnosis not present

## 2019-09-04 DIAGNOSIS — Z20822 Contact with and (suspected) exposure to covid-19: Secondary | ICD-10-CM | POA: Insufficient documentation

## 2019-09-04 DIAGNOSIS — J189 Pneumonia, unspecified organism: Secondary | ICD-10-CM | POA: Diagnosis not present

## 2019-09-04 DIAGNOSIS — Z85048 Personal history of other malignant neoplasm of rectum, rectosigmoid junction, and anus: Secondary | ICD-10-CM | POA: Insufficient documentation

## 2019-09-04 DIAGNOSIS — Z7901 Long term (current) use of anticoagulants: Secondary | ICD-10-CM | POA: Diagnosis not present

## 2019-09-04 LAB — CBC
HCT: 34.6 % — ABNORMAL LOW (ref 36.0–46.0)
Hemoglobin: 10.8 g/dL — ABNORMAL LOW (ref 12.0–15.0)
MCH: 30.2 pg (ref 26.0–34.0)
MCHC: 31.2 g/dL (ref 30.0–36.0)
MCV: 96.6 fL (ref 80.0–100.0)
Platelets: 209 10*3/uL (ref 150–400)
RBC: 3.58 MIL/uL — ABNORMAL LOW (ref 3.87–5.11)
RDW: 14.5 % (ref 11.5–15.5)
WBC: 8.8 10*3/uL (ref 4.0–10.5)
nRBC: 0 % (ref 0.0–0.2)

## 2019-09-04 LAB — COMPREHENSIVE METABOLIC PANEL
ALT: 18 U/L (ref 0–44)
AST: 44 U/L — ABNORMAL HIGH (ref 15–41)
Albumin: 3.9 g/dL (ref 3.5–5.0)
Alkaline Phosphatase: 82 U/L (ref 38–126)
Anion gap: 11 (ref 5–15)
BUN: 18 mg/dL (ref 8–23)
CO2: 22 mmol/L (ref 22–32)
Calcium: 8.9 mg/dL (ref 8.9–10.3)
Chloride: 104 mmol/L (ref 98–111)
Creatinine, Ser: 0.67 mg/dL (ref 0.44–1.00)
GFR calc Af Amer: >60 mL/min (ref >60)
GFR calc non Af Amer: >60 mL/min (ref >60)
Glucose, Bld: 107 mg/dL — ABNORMAL HIGH (ref 70–99)
Potassium: 3.6 mmol/L (ref 3.5–5.1)
Sodium: 137 mmol/L (ref 135–145)
Total Bilirubin: 0.7 mg/dL (ref 0.3–1.2)
Total Protein: 6.7 g/dL (ref 6.5–8.1)

## 2019-09-04 LAB — PROTIME-INR
INR: 1.6 — ABNORMAL HIGH (ref 0.8–1.2)
Prothrombin Time: 18.8 seconds — ABNORMAL HIGH (ref 11.4–15.2)

## 2019-09-04 LAB — URINALYSIS, ROUTINE W REFLEX MICROSCOPIC
Bacteria, UA: NONE SEEN
Bilirubin Urine: NEGATIVE
Glucose, UA: NEGATIVE mg/dL
Hgb urine dipstick: NEGATIVE
Ketones, ur: 80 mg/dL — AB
Leukocytes,Ua: NEGATIVE
Nitrite: NEGATIVE
Protein, ur: 30 mg/dL — AB
Specific Gravity, Urine: 1.02 (ref 1.005–1.030)
pH: 6 (ref 5.0–8.0)

## 2019-09-04 LAB — SARS CORONAVIRUS 2 BY RT PCR (HOSPITAL ORDER, PERFORMED IN ~~LOC~~ HOSPITAL LAB): SARS Coronavirus 2: NEGATIVE

## 2019-09-04 LAB — CBG MONITORING, ED: Glucose-Capillary: 102 mg/dL — ABNORMAL HIGH (ref 70–99)

## 2019-09-04 MED ORDER — SODIUM CHLORIDE 0.9 % IV SOLN
1.0000 g | Freq: Once | INTRAVENOUS | Status: AC
  Administered 2019-09-04: 1 g via INTRAVENOUS
  Filled 2019-09-04: qty 10

## 2019-09-04 MED ORDER — ACETAMINOPHEN 325 MG PO TABS
650.0000 mg | ORAL_TABLET | Freq: Once | ORAL | Status: AC
  Administered 2019-09-04: 650 mg via ORAL
  Filled 2019-09-04: qty 2

## 2019-09-04 MED ORDER — DOXYCYCLINE HYCLATE 100 MG PO CAPS
100.0000 mg | ORAL_CAPSULE | Freq: Two times a day (BID) | ORAL | 0 refills | Status: DC
Start: 2019-09-04 — End: 2019-12-22

## 2019-09-04 MED ORDER — SODIUM CHLORIDE 0.9 % IV BOLUS
1000.0000 mL | Freq: Once | INTRAVENOUS | Status: AC
  Administered 2019-09-04: 1000 mL via INTRAVENOUS

## 2019-09-04 MED ORDER — AMOXICILLIN-POT CLAVULANATE 875-125 MG PO TABS
1.0000 | ORAL_TABLET | Freq: Two times a day (BID) | ORAL | 0 refills | Status: DC
Start: 2019-09-04 — End: 2019-12-22

## 2019-09-04 MED ORDER — SODIUM CHLORIDE 0.9 % IV SOLN
500.0000 mg | Freq: Once | INTRAVENOUS | Status: AC
  Administered 2019-09-04: 500 mg via INTRAVENOUS
  Filled 2019-09-04: qty 500

## 2019-09-04 MED ORDER — SODIUM CHLORIDE 0.9% FLUSH: 3.0000 mL | Freq: Once | INTRAVENOUS | Status: DC

## 2019-09-04 NOTE — ED Triage Notes (Signed)
Pt from home w/ family.  Reports today she fell although her story is a little bit vague.  Pt feels hot to the touch however temp is only 99.8 oral.    Pt is a poor historian, waiting for family member who is moving his car for further information on the altered mental status.

## 2019-09-04 NOTE — ED Notes (Signed)
Pt amulated to RR with assitance. Pt ambulated and maintain Sp02 of 97%

## 2019-09-04 NOTE — ED Notes (Signed)
Patient verbalizes understanding of discharge instructions. Opportunity for questioning and answers were provided. Armband removed by staff, pt discharged from ED.  

## 2019-09-04 NOTE — ED Notes (Signed)
Purewick adjusted and pt encouraged to provide urine sample.

## 2019-09-04 NOTE — Discharge Instructions (Addendum)
Start Augmentin twice daily with food for one week (antibiotic) Start Doxycycline twice daily with food for 5 days (antibiotic) Please drink plenty of fluids since you are mildly dehydrated The hospital at home program will follow up with you tomorrow Return to the ED for worsening symptoms

## 2019-09-04 NOTE — ED Provider Notes (Signed)
[facility] EMERGENCY DEPARTMENT Provider Note   CSN: [id number] Arrival date & time: 09/04/19  0536     History Chief Complaint  Patient presents with  . Fall  . Altered Mental Status    Megan Munoz is a 86 y.o. female with history of HTN, HLD, hx of CVA, COPD, A.fib on Xarelto, hx of rectal cancer currently in remission who presents with AMS and a fall. Her son in law is at bedside and helps with history. She lives with her family. She states she got up to go to the bathroom this morning and she came back to her room and thought she was sitting down on the bed but she found herself on the floor. She can't recall a fall. Her son in law states he was alerted around 4:30 in the morning because she has an alert button. He got her up and EMS checked her out and got her back to bed. This morning they decided to bring her to the ED and she has been very weak and having difficulty walking with her walker. He notes that yesterday she wasn't feeling well and he checked her vitals. Her sats were borderline low (90-91%) and HR was ~111 and she had an elevated temp of 100. She hasn't been eating and drinking well and has been more confused than normal. The patient reports feeling short of breath "sometimes" but not currently. She denies any pain. No cough, nausea, vomiting, diarrhea. She has been urinating more frequently. She has had 2 COVID vaccines.  HPI     Past Medical History:  Diagnosis Date  . Anxiety   . Atherosclerosis of aortic arch [facility]) April 2017   Noted on CT chest, calcified aortic atherosclerosis at the arch.  . Atrial fibrillation, permanent ([place]) 03/2013   Previously on Atenolol for Rate - now on combination diltiazem and carvedilol for rate Control;  & Xarelto for AC. CHADS2VASC2 = 7.  . Cerebral infarction [facility]) 10/2008   MRI Brain 05/2014 for near syncope revealed: No acure infarct.  Moderate to large remote left frontal lobe infarct with encephalomalacia and  dilation of the frontal horn of the left lateral ventricle.   Mild small vessel disease type changes.  No intracranial hemorrhage.  Global atrophy.   Marland Kitchen COPD (chronic obstructive pulmonary disease) ([place])   . Coronary atherosclerosis due to calcified coronary lesion April 2017   Noted on CT chest  . Dementia ([place])    following 2010 stroke  . Diastolic dysfunction with chronic heart failure (HCC)    Exacerbated by A. fib RVR  . Hyperlipemia   . Hypertension   . Pneumonia   . PONV (postoperative nausea and vomiting)   . Stroke [facility])    2010- patient has dementia related to stroke   . Tinea unguium   . Tremor   . Tuberculosis     Patient Active Problem List   Diagnosis Date Noted  . Cerebrovascular accident (CVA) due to thrombosis of left middle cerebral artery ([place]) 06/20/2019  . Rectal cancer ([place]) 09/21/2018  . Pulmonary hypertension ([place]) 02/07/2018  . Acute on chronic diastolic heart failure ([place]) 02/07/2018  . Respiratory distress   . Encephalomalacia on imaging study 02/03/2018  . COPD (chronic obstructive pulmonary disease) with emphysema ([place]) 02/03/2018  . Gastrointestinal hemorrhage   . CAP (community acquired pneumonia) 02/02/2018  . Tremor 07/21/2017  . Insomnia 07/21/2017  . Dementia without behavioral disturbance ([place]) 07/21/2016  . Gait abnormality 07/21/2016  . Recurrent pneumonia  12/06/2015  . Multiple lung nodules on CT 11/01/2015  . Chronic diastolic CHF (congestive heart failure) ([place]) 10/30/2015  . Bilateral lower extremity edema 08/05/2015  . Lobar pneumonia due to unspecified organism 07/30/2015  . Malnutrition of moderate degree 07/29/2015  . Hyponatremia 07/25/2015  . Symptomatic anemia 07/25/2015  . Acute respiratory failure with hypoxia ([place]) 07/24/2015  . Essential hypertension 07/22/2014  . Permanent atrial fibrillation ([place]): CHA2DS2-VASc Score 7 - on Xarelto 07/07/2014  . Hyperlipidemia 07/07/2014  . H/O: stroke 10/29/2008    Past Surgical  History:  Procedure Laterality Date  . BIOPSY  06/01/2018   Procedure: BIOPSY;  Surgeon: Ronnette Juniper, MD;  Location: [facility];  Service: Gastroenterology;;  . BLADDER SURGERY    . CARPAL TUNNEL RELEASE Left   . COLONOSCOPY N/A 06/01/2018   Procedure: COLONOSCOPY;  Surgeon: Ronnette Juniper, MD;  Location: [facility] ENDOS[facility];  Service: Gastroenterology;  Laterality: N/A;  . ESOPHAGOGASTRODUODENOSCOPY (EGD) WITH PROPOFOL N/A 02/04/2018   Procedure: ESOPHAGOGASTRODUODENOSCOPY (EGD) WITH PROPOFOL;  Surgeon: Ronnette Juniper, MD;  Location: [facility];  Service: Gastroenterology;  Laterality: N/A;  . FOOT SURGERY Right   . HERNIA REPAIR    . NASAL SINUS SURGERY    . POLYPECTOMY  06/01/2018   Procedure: POLYPECTOMY;  Surgeon: Ronnette Juniper, MD;  Location: [facility];  Service: Gastroenterology;;  . TOTAL ABDOMINAL HYSTERECTOMY       OB History   No obstetric history on file.     Family History  Problem Relation Age of Onset  . Osteoporosis Mother   . Emphysema Father   . Lung disease Father        Black Lung  . Stroke Brother   . Cancer Maternal Grandmother   . Emphysema Paternal Grandmother   . Asthma Daughter        allergy induced    Social History   Tobacco Use  . Smoking status: Former Smoker    Packs/day: 1.00    Years: 16.00    Pack years: 16.00    Start date: 03/30/1954    Quit date: 03/30/1970    Years since quitting: 49.4  . Smokeless tobacco: Never Used  . Tobacco comment: smoked 1/2-1 ppd  Substance Use Topics  . Alcohol use: Not Currently    Alcohol/week: 0.0 standard drinks  . Drug use: No    Home Medications Prior to Admission medications   Medication Sig Start Date End Date Taking? Authorizing Provider  Azelastine HCl 137 MCG/SPRAY SOLN Place 2 sprays into both nostrils 2 (two) times daily. Use in each nostril as directed     [provider]  B Complex Vitamins (VITAMIN B COMPLEX PO) Take 1 tablet by mouth daily.     [provider]  calcitonin,  salmon, (MIACALCIN) 200 UNIT/ACT nasal spray Place 1 spray into alternate nostrils daily.    [provider]  Cholecalciferol (VITAMIN D3 SUPER STRENGTH) 2000 units CAPS Take 2,000 Units by mouth daily.     [provider]  diltiazem (CARDIZEM CD) 120 MG 24 hr capsule TAKE 240 MG (2 TABLETS) DAILY. IF DIZZY OR BLOOD PRESSURE IS LOW MAY TAKE 120 MG ONLY THAT DAY 02/28/19   Leonie Man, MD  docusate sodium (COLACE) 100 MG capsule Take 100-200 mg by mouth 2 (two) times daily as needed for mild constipation. Pt does take at least 100 mg daily    [provider]  donepezil (ARICEPT) 10 MG tablet TAKE 1 TABLET AT BEDTIME 08/17/19   Suzzanne Cloud, NP  feeding supplement, ENSURE ENLIVE, (ENSURE ENLIVE) LIQD Take 237 mLs by mouth 3 (three) times daily between meals. Patient taking differently: Take 237 mLs by mouth See admin instructions. 2-3  Times daily 07/30/15   Dhungel, Nishant, MD  ferrous sulfate 325 (65 FE) MG tablet Take 1 tablet (325 mg total) by mouth every other day. 02/07/18   Wilber Oliphant, MD  FLUoxetine (PROZAC) 40 MG capsule Take 40 mg by mouth daily.    [provider]  Fluticasone-Umeclidin-Vilant (TRELEGY ELLIPTA) 100-62.5-25 MCG/INH AEPB Inhale 1 puff into the lungs daily.    [provider]  furosemide (LASIX) 20 MG tablet Take 1 tablet (20 mg total) by mouth daily as needed for fluid or edema. Patient taking differently: Take 20 mg by mouth every other day.  08/05/15   Leonie Man, MD  lamoTRIgine (LAMICTAL) 100 MG tablet Take 1 tablet (100 mg total) by mouth 2 (two) times daily. 06/20/19   Marcial Pacas, MD  lamoTRIgine (LAMICTAL) 25 MG tablet 1 tab bid x one week 2 tab bid x 2nd week 3 tab bid x 3rd week 06/20/19   Marcial Pacas, MD  loratadine (CLARITIN) 10 MG tablet Take 10 mg by mouth daily.    [provider]  Melatonin 10 MG TABS Take 10 mg by mouth at bedtime.     [provider]  memantine (NAMENDA) 10 MG tablet Take  1 tablet (10 mg total) by mouth 2 (two) times daily. 07/26/18   Suzzanne Cloud, NP  Multiple Vitamin (MULTIVITAMIN) capsule Take 1 capsule by mouth daily.    [provider]  rivaroxaban (XARELTO) 10 MG TABS tablet Take 1 tablet (10 mg total) by mouth daily. 10/05/18   Truitt Merle, MD  simvastatin (ZOCOR) 40 MG tablet Take 40 mg by mouth daily.    [provider]    Allergies    Topiramate and Tetanus toxoids  Review of Systems   Review of Systems  Constitutional: Negative for chills and fever.  Respiratory: Positive for shortness of breath. Negative for cough and wheezing.   Cardiovascular: Negative for chest pain.  Gastrointestinal: Negative for abdominal pain, diarrhea, nausea and vomiting.  Genitourinary: Positive for frequency. Negative for dysuria and flank pain.  Musculoskeletal: Negative for back pain.  Neurological: Positive for weakness. Negative for dizziness, syncope, light-headedness and headaches.  Psychiatric/Behavioral: Positive for confusion.  All other systems reviewed and are negative.   Physical Exam Updated Vital Signs BP (!) 150/68 (BP Location: Right Arm)   Pulse (!) 120   Temp 100 F (37.8 C) (Rectal)   Resp (!) 23   Ht 5\' 7"  (1.702 m)   Wt 59.5 kg   SpO2 94%   BMI 20.54 kg/m   Physical Exam Vitals and nursing note reviewed.  Constitutional:      General: She is not in acute distress.    Appearance: Normal appearance. She is well-developed. She is not ill-appearing.  HENT:     Head: Normocephalic and atraumatic.  Eyes:     General: No scleral icterus.       Right eye: No discharge.        Left eye: No discharge.     Conjunctiva/sclera: Conjunctivae normal.     Pupils: Pupils are equal, round, and reactive to light.  Cardiovascular:     Rate and Rhythm: Tachycardia present. Rhythm irregularly irregular.  Pulmonary:     Effort: Pulmonary effort is normal. Tachypnea present. No respiratory distress.     Breath  sounds: Normal breath  sounds.  Abdominal:     General: There is no distension.     Palpations: Abdomen is soft.     Tenderness: There is no abdominal tenderness.  Musculoskeletal:     Cervical back: Normal range of motion.  Skin:    General: Skin is warm and dry.  Neurological:     Mental Status: She is alert and oriented to person, place, and time.     Comments: Mild tremor noted of bilateral upper extremities  Psychiatric:        Behavior: Behavior normal.     ED Results / Procedures / Treatments   Labs (all labs ordered are listed, but only abnormal results are displayed) Labs Reviewed  COMPREHENSIVE METABOLIC PANEL - Abnormal; Notable for the following components:      Result Value   Glucose, Bld 107 (*)    AST 44 (*)    All other components within normal limits  CBC - Abnormal; Notable for the following components:   RBC 3.58 (*)    Hemoglobin 10.8 (*)    HCT 34.6 (*)    All other components within normal limits  PROTIME-INR - Abnormal; Notable for the following components:   Prothrombin Time 18.8 (*)    INR 1.6 (*)    All other components within normal limits  URINALYSIS, ROUTINE W REFLEX MICROSCOPIC - Abnormal; Notable for the following components:   Ketones, ur 80 (*)    Protein, ur 30 (*)    All other components within normal limits  CBG MONITORING, ED - Abnormal; Notable for the following components:   Glucose-Capillary 102 (*)    All other components within normal limits  SARS CORONAVIRUS 2 BY RT PCR [facility] ORDER, [place])    EKG EKG Interpretation  Date/Time:  Monday September 04 2019 05:43:50 EDT Ventricular Rate:  118 PR Interval:    QRS Duration: 78 QT Interval:  326 QTC Calculation: 456 R Axis:   -28 Text Interpretation: Atrial fibrillation with rapid ventricular response with premature ventricular or aberrantly conducted complexes Anteroseptal infarct , age undetermined Abnormal ECG No acute changes No significant change since last tracing  Confirmed by Varney Biles [phone]) on 09/04/2019 10:18:34 AM   Radiology DG Chest 2 View  Result Date: 09/04/2019 CLINICAL DATA:  Altered mental status EXAM: CHEST - 2 VIEW COMPARISON:  10/25/2018 FINDINGS: Mild cardiomegaly. Especially on the second view there is a hazy density at the right base. No edema, effusion, or pneumothorax. No acute osseous finding. IMPRESSION: Mild atelectasis or infiltrate at the right base. Electronically Signed   By: Monte Fantasia M.D.   On: 09/04/2019 07:12    Procedures Procedures (including critical care time)  Medications Ordered in ED Medications  sodium chloride flush (NS) 0.9 % injection 3 mL (has no administration in time range)  cefTRIAXone (ROCEPHIN) 1 g in sodium chloride 0.9 % 100 mL IVPB (0 g Intravenous Stopped 09/04/19 0842)  azithromycin (ZITHROMAX) 500 mg in sodium chloride 0.9 % 250 mL IVPB (0 mg Intravenous Stopped 09/04/19 0943)  sodium chloride 0.9 % bolus 1,000 mL (0 mLs Intravenous Stopped 09/04/19 0954)  acetaminophen (TYLENOL) tablet 650 mg (650 mg Oral Given 09/04/19 0805)    ED Course  I have reviewed the triage vital signs and the nursing notes.  Pertinent labs & imaging results that were available during my care of the patient were reviewed by me and considered in my medical decision making (see chart for details).  86 year old female presents with AMS, weakness, and a fall. Temp is elevated at 100. HR is elevated as well with rate 110-131. EKG confirms A.fib with RVR. Other vitals are reassuring. She is generally weak appearing on exam and mildly tachypenic. Labs are reassuring. She has mild stable anemia. CXR shows right lower lobe pneumonia. UA is pending. Will give fluids, Tylenol, and abx. CURB-65 score is 2 which is moderate risk and 6.8% 30 day mortality. COVID is negative.  Shared visit with Dr. Kathrynn Humble. Will contact hospital at home program to see if we can avoid admission. Discussed with Dr. Shan Levans who will evaluate pt.    Dr. Shan Levans feels pt is a candidate for hospital at home. Pt feels better and ambulated after meds and fluids. UA does not show UTI but has 80 ketones. Pt has hx of CHF and is tolerating PO so will have her hydrate orally. Antibiotic rx send to patient's pharmacy and her son in law was advised they would be called so they can be followed up tomorrow.  Megan Munoz was evaluated in Emergency Department on 09/04/2019 for the symptoms described in the history of present illness. She was evaluated in the context of the global COVID-19 pandemic, which necessitated consideration that the patient might be at risk for infection with the SARS-CoV-2 virus that causes COVID-19. Institutional protocols and algorithms that pertain to the evaluation of patients at risk for COVID-19 are in a state of rapid change based on information released by regulatory bodies including the CDC and federal and state organizations. These policies and algorithms were followed during the patient's care in the ED.   MDM Rules/Calculators/A&P                       Final Clinical Impression(s) / ED Diagnoses Final diagnoses:  Community acquired pneumonia of right lower lobe of lung    Rx / DC Orders ED Discharge Orders    None       Recardo Evangelist, PA-C 09/04/19 1532    Varney Biles, MD 09/04/19 1624

## 2019-09-04 NOTE — ED Notes (Addendum)
purewick placed on patient.  

## 2019-09-04 NOTE — Plan of Care (Addendum)
[facility]  Consult Note  Assessment: Patient is an 86 year old female with PMH of Permanent atrial fibrillation, HFpEF, PH, COPD, Malnutrition, recurrent pneumonias, CVA, rectal cancer, chronic tremor of upper extremities.  She arrived early this morning after some confusion at home during the night apparently getting out of bed and was found on the floor with no recollection of this.  Her lab workup was overall reassuring and she has a chest x-ray with RLL infiltrate vs atelectasis.   Cardiac: JVD flat, mildly tachycardic irregularly irregular with frequent ectopy, no LE edema Pulmonary: not in distress, mild wheezing RLL otherwise clear Abdominal: non distended abdomen, soft and nontender Psych: Alert, at her baseline, conversant, in good spirits   Plan:  Patient will benefit from close follow up given the events described above and her comorbidities.  They will prescribe her a short course of oral antibiotics to cover for pneumonia. We will see her tomorrow in our home based primary care program and will continue to follow her closely.    Greatly appreciate consultation from ED provider.    Katherine Roan, MD 09/04/2019, 11:38 AM

## 2019-09-13 ENCOUNTER — Emergency Department (HOSPITAL_COMMUNITY): Payer: Medicare Other

## 2019-09-13 ENCOUNTER — Other Ambulatory Visit: Payer: Self-pay

## 2019-09-13 ENCOUNTER — Emergency Department (HOSPITAL_COMMUNITY)
Admission: EM | Admit: 2019-09-13 | Discharge: 2019-09-13 | Disposition: A | Discharge status: Home / Self Care | Payer: Medicare Other | Attending: Emergency Medicine | Admitting: Emergency Medicine

## 2019-09-13 DIAGNOSIS — Y92009 Unspecified place in unspecified non-institutional (private) residence as the place of occurrence of the external cause: Secondary | ICD-10-CM | POA: Insufficient documentation

## 2019-09-13 DIAGNOSIS — W010XXA Fall on same level from slipping, tripping and stumbling without subsequent striking against object, initial encounter: Secondary | ICD-10-CM | POA: Insufficient documentation

## 2019-09-13 DIAGNOSIS — S0990XA Unspecified injury of head, initial encounter: Secondary | ICD-10-CM | POA: Diagnosis present

## 2019-09-13 DIAGNOSIS — S0003XA Contusion of scalp, initial encounter: Secondary | ICD-10-CM

## 2019-09-13 DIAGNOSIS — W19XXXA Unspecified fall, initial encounter: Secondary | ICD-10-CM

## 2019-09-13 DIAGNOSIS — I110 Hypertensive heart disease with heart failure: Secondary | ICD-10-CM | POA: Insufficient documentation

## 2019-09-13 DIAGNOSIS — R519 Headache, unspecified: Secondary | ICD-10-CM | POA: Diagnosis not present

## 2019-09-13 DIAGNOSIS — Z7901 Long term (current) use of anticoagulants: Secondary | ICD-10-CM | POA: Diagnosis not present

## 2019-09-13 DIAGNOSIS — J449 Chronic obstructive pulmonary disease, unspecified: Secondary | ICD-10-CM | POA: Insufficient documentation

## 2019-09-13 DIAGNOSIS — Y939 Activity, unspecified: Secondary | ICD-10-CM | POA: Insufficient documentation

## 2019-09-13 DIAGNOSIS — R102 Pelvic and perineal pain: Secondary | ICD-10-CM | POA: Diagnosis not present

## 2019-09-13 DIAGNOSIS — Y999 Unspecified external cause status: Secondary | ICD-10-CM | POA: Insufficient documentation

## 2019-09-13 DIAGNOSIS — Z79899 Other long term (current) drug therapy: Secondary | ICD-10-CM | POA: Diagnosis not present

## 2019-09-13 DIAGNOSIS — Z85048 Personal history of other malignant neoplasm of rectum, rectosigmoid junction, and anus: Secondary | ICD-10-CM | POA: Diagnosis not present

## 2019-09-13 DIAGNOSIS — I5032 Chronic diastolic (congestive) heart failure: Secondary | ICD-10-CM | POA: Insufficient documentation

## 2019-09-13 DIAGNOSIS — F0390 Unspecified dementia without behavioral disturbance: Secondary | ICD-10-CM | POA: Insufficient documentation

## 2019-09-13 DIAGNOSIS — Z87891 Personal history of nicotine dependence: Secondary | ICD-10-CM | POA: Diagnosis not present

## 2019-09-13 DIAGNOSIS — R079 Chest pain, unspecified: Secondary | ICD-10-CM | POA: Diagnosis not present

## 2019-09-13 DIAGNOSIS — J90 Pleural effusion, not elsewhere classified: Secondary | ICD-10-CM | POA: Diagnosis not present

## 2019-09-13 LAB — CBC WITH DIFFERENTIAL/PLATELET
Abs Immature Granulocytes: 0.05 10*3/uL (ref 0.00–0.07)
Basophils Absolute: 0 10*3/uL (ref 0.0–0.1)
Basophils Relative: 0 %
Eosinophils Absolute: 0 10*3/uL (ref 0.0–0.5)
Eosinophils Relative: 0 %
HCT: 33.9 % — ABNORMAL LOW (ref 36.0–46.0)
Hemoglobin: 10.1 g/dL — ABNORMAL LOW (ref 12.0–15.0)
Immature Granulocytes: 1 %
Lymphocytes Relative: 17 %
Lymphs Abs: 0.9 10*3/uL (ref 0.7–4.0)
MCH: 29 pg (ref 26.0–34.0)
MCHC: 29.8 g/dL — ABNORMAL LOW (ref 30.0–36.0)
MCV: 97.4 fL (ref 80.0–100.0)
Monocytes Absolute: 0.4 10*3/uL (ref 0.1–1.0)
Monocytes Relative: 7 %
Neutro Abs: 4.1 10*3/uL (ref 1.7–7.7)
Neutrophils Relative %: 75 %
Platelets: 339 10*3/uL (ref 150–400)
RBC: 3.48 MIL/uL — ABNORMAL LOW (ref 3.87–5.11)
RDW: 14.4 % (ref 11.5–15.5)
WBC: 5.4 10*3/uL (ref 4.0–10.5)
nRBC: 0 % (ref 0.0–0.2)

## 2019-09-13 LAB — BASIC METABOLIC PANEL
Anion gap: 10 (ref 5–15)
BUN: 16 mg/dL (ref 8–23)
CO2: 26 mmol/L (ref 22–32)
Calcium: 9.1 mg/dL (ref 8.9–10.3)
Chloride: 105 mmol/L (ref 98–111)
Creatinine, Ser: 0.61 mg/dL (ref 0.44–1.00)
GFR calc Af Amer: >60 mL/min (ref >60)
GFR calc non Af Amer: >60 mL/min (ref >60)
Glucose, Bld: 103 mg/dL — ABNORMAL HIGH (ref 70–99)
Potassium: 3.8 mmol/L (ref 3.5–5.1)
Sodium: 141 mmol/L (ref 135–145)

## 2019-09-13 LAB — PROTIME-INR
INR: 1.5 — ABNORMAL HIGH (ref 0.8–1.2)
Prothrombin Time: 17.5 seconds — ABNORMAL HIGH (ref 11.4–15.2)

## 2019-09-13 NOTE — ED Triage Notes (Signed)
Patient arrived via [facility] with c/o fall on blood thinner. Patient stated she was in the bathroom and turned and fell. Denies loosing consciousness, hit head on counter. No N/V, no SOB noted. Patient denies any pain at this time, has dementia. Egg size hematoma on right side noted on right temporal. No other obvious injuries.

## 2019-09-13 NOTE — Discharge Instructions (Addendum)
Recommend follow-up with your primary doctor regarding the fall today.  If you have any additional falls, have any lightheadedness, neck pain, chest pain or difficulty in breathing, please return to ER for reassessment.

## 2019-09-13 NOTE — ED Notes (Signed)
TO CT with this RN

## 2019-09-13 NOTE — ED Notes (Signed)
Got patient undress on the monitor did ekg shown to the er doctor patient is resting with call bell in reach

## 2019-09-13 NOTE — ED Provider Notes (Addendum)
[facility] EMERGENCY DEPARTMENT Provider Note   CSN: [id number] Arrival date & time: 09/13/19  4098     History Chief Complaint  Patient presents with  . Fall    Megan Munoz is a 86 y.o. female.  HTN, HLD, hx of CVA, COPD, A.fib on Xarelto, hx of rectal cancer currently in remission presents after fall.  This morning, was in her house tripped, fell, landed on right side of her head.  Denies any neck pain, back pain, chest or abdominal pain.  Has been ambulatory since accident without issue.  Denies any ongoing medical complaints.  HPI     Past Medical History:  Diagnosis Date  . Anxiety   . Atherosclerosis of aortic arch [facility]) April 2017   Noted on CT chest, calcified aortic atherosclerosis at the arch.  . Atrial fibrillation, permanent ([place]) 03/2013   Previously on Atenolol for Rate - now on combination diltiazem and carvedilol for rate Control;  & Xarelto for AC. CHADS2VASC2 = 7.  . Cerebral infarction [facility]) 10/2008   MRI Brain 05/2014 for near syncope revealed: No acure infarct.  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   Mild small vessel disease type changes.  No intracranial hemorrhage.  Global atrophy.   Marland Kitchen COPD (chronic obstructive pulmonary disease) ([place])   . Coronary atherosclerosis due to calcified coronary lesion April 2017   Noted on CT chest  . Dementia ([place])    following 2010 stroke  . Diastolic dysfunction with chronic heart failure (HCC)    Exacerbated by A. fib RVR  . Hyperlipemia   . Hypertension   . Pneumonia   . PONV (postoperative nausea and vomiting)   . Stroke [facility])    2010- patient has dementia related to stroke   . Tinea unguium   . Tremor   . Tuberculosis     Patient Active Problem List   Diagnosis Date Noted  . Cerebrovascular accident (CVA) due to thrombosis of left middle cerebral artery ([place]) 06/20/2019  . Rectal cancer ([place])  09/21/2018  . Pulmonary hypertension ([place]) 02/07/2018  . Acute on chronic diastolic heart failure ([place]) 02/07/2018  . Respiratory distress   . Encephalomalacia on imaging study 02/03/2018  . COPD (chronic obstructive pulmonary disease) with emphysema ([place]) 02/03/2018  . Gastrointestinal hemorrhage   . CAP (community acquired pneumonia) 02/02/2018  . Tremor 07/21/2017  . Insomnia 07/21/2017  . Dementia without behavioral disturbance ([place]) 07/21/2016  . Gait abnormality 07/21/2016  . Recurrent pneumonia 12/06/2015  . Multiple lung nodules on CT 11/01/2015  . Chronic diastolic CHF (congestive heart failure) ([place]) 10/30/2015  . Bilateral lower extremity edema 08/05/2015  . Lobar pneumonia due to unspecified organism 07/30/2015  . Malnutrition of moderate degree 07/29/2015  . Hyponatremia 07/25/2015  . Symptomatic anemia 07/25/2015  . Acute respiratory failure with hypoxia ([place]) 07/24/2015  . Essential hypertension 07/22/2014  . Permanent atrial fibrillation ([place]): CHA2DS2-VASc Score 7 - on Xarelto 07/07/2014  . Hyperlipidemia 07/07/2014  . H/O: stroke 10/29/2008    Past Surgical History:  Procedure Laterality Date  . BIOPSY  06/01/2018   Procedure: BIOPSY;  Surgeon: Ronnette Juniper, MD;  Location: [facility];  Service: Gastroenterology;;  . BLADDER SURGERY    . CARPAL TUNNEL RELEASE Left   . COLONOSCOPY N/A 06/01/2018   Procedure: COLONOSCOPY;  Surgeon: Ronnette Juniper, MD;  Location: [facility];  Service: Gastroenterology;  Laterality: N/A;  . ESOPHAGOGASTRODUODENOSCOPY (EGD) WITH  PROPOFOL N/A 02/04/2018   Procedure: ESOPHAGOGASTRODUODENOSCOPY (EGD) WITH PROPOFOL;  Surgeon: Ronnette Juniper, MD;  Location: [place];  Service: Gastroenterology;  Laterality: N/A;  . FOOT SURGERY Right   . HERNIA REPAIR    . NASAL SINUS SURGERY    . POLYPECTOMY  06/01/2018   Procedure: POLYPECTOMY;  Surgeon: Ronnette Juniper, MD;  Location: [facility];  Service: Gastroenterology;;  . TOTAL ABDOMINAL HYSTERECTOMY        OB History   No obstetric history on file.     Family History  Problem Relation Age of Onset  . Osteoporosis Mother   . Emphysema Father   . Lung disease Father        Black Lung  . Stroke Brother   . Cancer Maternal Grandmother   . Emphysema Paternal Grandmother   . Asthma Daughter        allergy induced    Social History   Tobacco Use  . Smoking status: Former Smoker    Packs/day: 1.00    Years: 16.00    Pack years: 16.00    Start date: 03/30/1954    Quit date: 03/30/1970    Years since quitting: 49.4  . Smokeless tobacco: Never Used  . Tobacco comment: smoked 1/2-1 ppd  Vaping Use  . Vaping Use: Never used  Substance Use Topics  . Alcohol use: Not Currently    Alcohol/week: 0.0 standard drinks  . Drug use: No    Home Medications Prior to Admission medications   Medication Sig Start Date End Date Taking? Authorizing Provider  acetaminophen (TYLENOL) 500 MG tablet Take 1,000 mg by mouth every 6 (six) hours as needed for mild pain, fever or headache.    [provider]  amoxicillin-clavulanate (AUGMENTIN) 875-125 MG tablet Take 1 tablet by mouth every 12 (twelve) hours. 09/04/19   Recardo Evangelist, PA-C  Azelastine HCl 137 MCG/SPRAY SOLN Place 2 sprays into both nostrils 2 (two) times daily. Use in each nostril as directed     [provider]  B Complex Vitamins (VITAMIN B COMPLEX PO) Take 1 tablet by mouth daily.     [provider]  calcitonin, salmon, (MIACALCIN) 200 UNIT/ACT nasal spray Place 1 spray into alternate nostrils daily.    [provider]  Cholecalciferol (VITAMIN D3 SUPER STRENGTH) 2000 units CAPS Take 2,000 Units by mouth daily.     [provider]  diltiazem (CARDIZEM CD) 120 MG 24 hr capsule TAKE 240 MG (2 TABLETS) DAILY. IF DIZZY OR BLOOD PRESSURE IS LOW MAY TAKE 120 MG ONLY THAT DAY Patient taking differently: Take 240 mg by mouth daily. TAKE 240 MG (2 TABLETS) DAILY. IF DIZZY OR BLOOD PRESSURE IS  LOW MAY TAKE 120 MG ONLY THAT DAY 02/28/19   Leonie Man, MD  donepezil (ARICEPT) 10 MG tablet TAKE 1 TABLET AT BEDTIME Patient taking differently: Take 10 mg by mouth at bedtime.  08/17/19   Suzzanne Cloud, NP  doxycycline (VIBRAMYCIN) 100 MG capsule Take 1 capsule (100 mg total) by mouth 2 (two) times daily. 09/04/19   Recardo Evangelist, PA-C  feeding supplement, ENSURE ENLIVE, (ENSURE ENLIVE) LIQD Take 237 mLs by mouth 3 (three) times daily between meals. Patient taking differently: Take 237 mLs by mouth See admin instructions. 2-3  Times daily 07/30/15   Dhungel, Nishant, MD  ferrous sulfate 325 (65 FE) MG tablet Take 1 tablet (325 mg total) by mouth every other day. Patient taking differently: Take 325 mg by mouth every other day.  Jory Sims, Fri 02/07/18   Wilber Oliphant, MD  FLUoxetine (PROZAC) 40 MG capsule Take 40 mg by mouth daily.    [provider]  Fluticasone-Umeclidin-Vilant (TRELEGY ELLIPTA) 100-62.5-25 MCG/INH AEPB Inhale 1 puff into the lungs daily.    [provider]  furosemide (LASIX) 20 MG tablet Take 1 tablet (20 mg total) by mouth daily as needed for fluid or edema. Patient taking differently: Take 20 mg by mouth every other day. Mon, Wed, Fri 08/05/15   Leonie Man, MD  lamoTRIgine (LAMICTAL) 100 MG tablet Take 1 tablet (100 mg total) by mouth 2 (two) times daily. 06/20/19   Marcial Pacas, MD  lamoTRIgine (LAMICTAL) 25 MG tablet 1 tab bid x one week 2 tab bid x 2nd week 3 tab bid x 3rd week Patient not taking: Reported on 09/04/2019 06/20/19   Marcial Pacas, MD  loratadine (CLARITIN) 10 MG tablet Take 10 mg by mouth daily.    [provider]  Melatonin 10 MG TABS Take 10 mg by mouth at bedtime.     [provider]  memantine (NAMENDA) 10 MG tablet Take 1 tablet (10 mg total) by mouth 2 (two) times daily. 07/26/18   Suzzanne Cloud, NP  Multiple Vitamin (MULTIVITAMIN) capsule Take 1 capsule by mouth daily.    [provider]  rivaroxaban  (XARELTO) 10 MG TABS tablet Take 1 tablet (10 mg total) by mouth daily. 10/05/18   Truitt Merle, MD  simvastatin (ZOCOR) 40 MG tablet Take 40 mg by mouth daily.    [provider]  Wheat Dextrin (BENEFIBER DRINK MIX PO) Take 10 mLs by mouth daily. Mix with water     [provider]    Allergies    Topiramate and Tetanus toxoids  Review of Systems   Review of Systems  Constitutional: Negative for chills and fever.  HENT: Negative for ear pain and sore throat.   Eyes: Negative for pain and visual disturbance.  Respiratory: Negative for cough and shortness of breath.   Cardiovascular: Negative for chest pain and palpitations.  Gastrointestinal: Negative for abdominal pain and vomiting.  Genitourinary: Negative for dysuria and hematuria.  Musculoskeletal: Negative for arthralgias and back pain.  Skin: Negative for color change and rash.  Neurological: Negative for seizures and syncope.  All other systems reviewed and are negative.   Physical Exam Updated Vital Signs BP 128/62   Pulse 85   Temp 98.2 F (36.8 C) (Oral)   Resp 20   Ht 5\' 7"  (1.702 m)   Wt 59 kg   SpO2 99%   BMI 20.36 kg/m   Physical Exam Vitals and nursing note reviewed.  Constitutional:      General: She is not in acute distress.    Appearance: She is well-developed.  HENT:     Head: Normocephalic.     Comments: R frontal scalp hematoma, no significant laceration, no bleeding Eyes:     Conjunctiva/sclera: Conjunctivae normal.  Cardiovascular:     Rate and Rhythm: Normal rate and regular rhythm.     Heart sounds: No murmur heard.   Pulmonary:     Effort: Pulmonary effort is normal. No respiratory distress.     Breath sounds: Normal breath sounds.  Abdominal:     Palpations: Abdomen is soft.     Tenderness: There is no abdominal tenderness.  Musculoskeletal:     Cervical back: Neck supple.     Comments: Back: no C, T, L spine TTP, no step off  or deformity RUE: no TTP throughout, no  deformity, normal joint ROM, radial pulse intact, distal sensation and motor intact LUE: no TTP throughout, no deformity, normal joint ROM, radial pulse intact, distal sensation and motor intact RLE:  no TTP throughout, no deformity, normal joint ROM, distal pulse, sensation and motor intact LLE: no TTP throughout, no deformity, normal joint ROM, distal pulse, sensation and motor intact  Skin:    General: Skin is warm and dry.     Capillary Refill: Capillary refill takes less than 2 seconds.  Neurological:     General: No focal deficit present.     Mental Status: She is alert.  Psychiatric:        Mood and Affect: Mood normal.        Behavior: Behavior normal.     ED Results / Procedures / Treatments   Labs (all labs ordered are listed, but only abnormal results are displayed) Labs Reviewed  CBC WITH DIFFERENTIAL/PLATELET - Abnormal; Notable for the following components:      Result Value   RBC 3.48 (*)    Hemoglobin 10.1 (*)    HCT 33.9 (*)    MCHC 29.8 (*)    All other components within normal limits  BASIC METABOLIC PANEL - Abnormal; Notable for the following components:   Glucose, Bld 103 (*)    All other components within normal limits  PROTIME-INR - Abnormal; Notable for the following components:   Prothrombin Time 17.5 (*)    INR 1.5 (*)    All other components within normal limits    EKG None  Radiology DG Chest 1 View  Result Date: 09/13/2019 CLINICAL DATA:  Fall; chest pain; pelvis pain EXAM: CHEST  1 VIEW COMPARISON:  Chest radiograph 09/04/2019 FINDINGS: Stable cardiomediastinal contours with enlarged heart size. Aortic arch calcification. There is a new ill-defined opacity at the right lung base. The left lung is clear. No pneumothorax or large pleural effusion. No acute finding in the visualized skeleton. IMPRESSION: New opacity at the right lung base could represent aspiration, infection or contusion. Electronically Signed   By: Audie Pinto M.D.   On:  09/13/2019 10:21   DG Pelvis 1-2 Views  Result Date: 09/13/2019 CLINICAL DATA:  Recent fall with pelvic pain, initial encounter EXAM: PELVIS - 1 VIEW COMPARISON:  None. FINDINGS: Pelvic ring is intact. No acute fracture or dislocation is noted. Old healed fractures in the pubic rami on the left are seen. Mild degenerative changes of lumbar spine are seen. IMPRESSION: No acute abnormality noted. Electronically Signed   By: Inez Catalina M.D.   On: 09/13/2019 10:21   CT Head Wo Contrast  Result Date: 09/13/2019 CLINICAL DATA:  Fall.  Trauma right-sided head.  On blood thinners. EXAM: CT HEAD WITHOUT CONTRAST TECHNIQUE: Contiguous axial images were obtained from the base of the skull through the vertex without intravenous contrast. COMPARISON:  Brain MR 07/07/2014. FINDINGS: Brain: Expected cerebral atrophy. Mild low density in the periventricular white matter likely related to small vessel disease. Left frontal remote infarct with encephalomalacia and dilatation of the anterior horn of the left lateral ventricle. Physiologic calcifications within the basal ganglia. No mass lesion, hemorrhage, hydrocephalus, acute infarct, intra-axial, or extra-axial fluid collection. Ventriculomegaly is similar, favored to be due to cerebral atrophy. Vascular: Intracranial atherosclerosis. Skull: Moderate right frontal scalp soft tissue swelling/hematoma, including on 39/4. No underlying skull fracture. Sinuses/Orbits: Bilateral lens extractions. Clear paranasal sinuses and mastoid air cells. Other: None. IMPRESSION: 1. Right frontal scalp soft  tissue swelling/hematoma, without acute intracranial abnormality. 2. Cerebral atrophy and small vessel ischemic change. 3. Remote left frontal infarct. Electronically Signed   By: Abigail Miyamoto M.D.   On: 09/13/2019 10:18    Procedures Procedures (including critical care time)  Medications Ordered in ED Medications - No data to display  ED Course  I have reviewed the triage  vital signs and the nursing notes.  Pertinent labs & imaging results that were available during my care of the patient were reviewed by me and considered in my medical decision making (see chart for details).    MDM Rules/Calculators/A&P                          86 year old lady with multiple medical problems including A. fib on Xarelto presented to ER after mechanical fall at home.  On close trauma exam, only notable finding was a forehead/scalp hematoma.  CT head negative.  Patient otherwise well-appearing, no ongoing medical complaints, stable vital signs.  Labs grossly negative.  Screening pelvis and cxr negative for trauma. CXR showed pos RLL infiltrate. Recently dx with pnemonia and currently completing course of augmentin. Pt reports she has clinically been improving, no ongoing cough, no ongoing fevers, no worsening SOB.  Given there is clinical improvement, will defer making any changes in abx regimen. Discussed with pt and daugther recommendation that she have a close - 24 to 48 hour recheck with PCP and ask PCP to monitor her lungs, repeat CXR at some point to ensure resolution.  After the discussed management above, the patient was determined to be safe for discharge.  The patient was in agreement with this plan and all questions regarding their care were answered.  ED return precautions were discussed and the patient will return to the ED with any significant worsening of condition.    Final Clinical Impression(s) / ED Diagnoses Final diagnoses:  Hematoma of scalp, initial encounter  Fall, initial encounter    Rx / DC Orders ED Discharge Orders    None       Lucrezia Starch, MD 09/13/19 1045    Lucrezia Starch, MD 09/13/19 1112

## 2019-09-21 ENCOUNTER — Telehealth (INDEPENDENT_AMBULATORY_CARE_PROVIDER_SITE_OTHER): Payer: Medicare Other | Admitting: Neurology

## 2019-09-21 ENCOUNTER — Encounter: Payer: Self-pay | Admitting: Neurology

## 2019-09-21 DIAGNOSIS — R251 Tremor, unspecified: Secondary | ICD-10-CM | POA: Diagnosis not present

## 2019-09-21 DIAGNOSIS — G9389 Other specified disorders of brain: Secondary | ICD-10-CM

## 2019-09-21 DIAGNOSIS — F0390 Unspecified dementia without behavioral disturbance: Secondary | ICD-10-CM

## 2019-09-21 MED ORDER — MEMANTINE HCL 10 MG PO TABS: 10.0000 mg | ORAL_TABLET | Freq: Two times a day (BID) | ORAL | 3 refills | Status: AC

## 2019-09-21 NOTE — Progress Notes (Signed)
Virtual Visit via Video Note  I connected with Megan Munoz on 09/21/19 at 10:45 AM EDT by a video enabled telemedicine application and verified that I am speaking with the correct person using two identifiers.  Location: Patient: at her home Provider: in the office   I discussed the limitations of evaluation and management by telemedicine and the availability of in person appointments. The patient expressed understanding and agreed to proceed.  History of Present Illness: Megan Munoz a 86 yo RH , female, is accompanied by her daughter Megan Munoz, referred by her primary care physician Dr Megan Munoz for continued neurological care for her history of stroke, essential tremor,  She had a history of hypertension, hyperlipidemia, diagnosis of atrial fibrillation since 2015, is taking Xarelto  She suffered left frontal, basal ganglion stroke in 2010, presented with mild confusion, slow processing time, initially was treated with aspirin, since her diagnosis of atrial fibrillation in 2015, she was started on anticoagulation Xarelto  She moved in with her daughter recently, no longer driving, but independent in her daily activity, doing laundry, ambulate without assistance, mild memory trouble today's Mini-Mental Status Examination is 26 out of 30  She had long-standing history of gradual onset bilateral hands tremor, was diagnosed with essential tremor, acute worsening since her stroke in 2010, they have tried variable dose of primidone up to 750 mg daily, currently taking 250 mg twice a day, which works out best for her, she still has posturing tremor of her bilateral hands  UPDATE Dec 11 2014: MRI report in 2016 1. No MR evidence of an acute intracranial abnormality. 2. Sequela of previous left frontal infarct (with resultant cystic encephalomalacia with surrounding gliosis). 3. Age-appropriate global cerebral volume loss and background microvascular disease. 4. No MR evidence of  abnormal intracranial enhancement. 5. Bilateral maxillary sinus disease with small fluid levels suggesting an acute sinusitis.  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 [employer], she is now living with her daughter.She is no longer cooking, increase mild unsteady gait,  UPDATE June 20 2019 She has lived with her daughter since 2010 following her stroke, is on Aricept, Namenda for memory loss, denies a history of seizure, she was started on Topamax for bilateral hands tremor, while taking 100 mg twice a day, she was noted to have worsening confusion, it was stopped in February 2021, she seems to have mild improvement,  She has mild unsteady gait, use walker for longer distance, just finished physical therapy, she also underwent chemo and 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   MRI of pelvic with without contrast showed 3 cm polypoid mass along the right anterior lateral aspect of the anal verge, she was diagnosed with rectal adenocarcinoma, underwent radiation and chemotherapy, repeat MRI of pelvic in December 2020 showed resolution of the mass,  Laboratory evaluation in 2021, normal CMP, CBC showed hemoglobin 11.7,  Update September 21, 2019 SS: Via virtual visit, is at her home, accompanied by her daughter.  Now taking lamotrigine 100 mg twice a day, due to potential for partial seizure, also helping mood stabilizer and with tremor.  Did not respond well to Topamax, had confusion as side effect, this could explain confusion episodes, thought to be possible partial seizure.  However, patient does have left frontal encephalomalacia from previous stroke, making at risk for partial seizure.  Has suffered several falls, went to the ER June 16, CT head showed a right frontal hematoma, no acute intracranial abnormality.  They are now involved in a free program from [facility] health, " remote health", has a nurse that comes out, is also just started physical therapy.  She is wearing life alert.  Memory remains stable.  She has discontinued Prozac, is now on Lexapro 20 mg.  Also taking Aricept and Namenda.   Observations/Objective: Via virtual visit, is alert and oriented, speech is clear and concise, follows commands well, no arm drift, with hands outstretched, postural tremor noted to both hands, greater in the right than the left.  Able to stand from seated position with pushoff from chair, able to ambulate in the room with a walker, short stepping gait.  In March MMSE was 22/30  Assessment and Plan: 1.  Dementia 2.  Essential tremor 3.  Anxiety 4.  Encephalomalacia on MRI of the brain -Appears to overall be doing well, has had a few recent falls, starting physical therapy soon -Central nervous system degenerative disorder, with vascular  component -MRI of the brain in 2016 showed left frontal encephalomalacia from previous stroke, high risk for developing partial seizure-confusion episodes may have been side effect of Topamax?  -EEG showed mild background slowing -Continue Lamictal 100 mg twice a day, for mood, bilateral hand tremor, also AED medication, tolerating well  -Now off Prozac, on Lexapro 20 mg daily -Continue Aricept 10 mg daily, Namenda 10 mg twice a day -Now involved in " remote health" from [facility], has a nurse coming out, starting physical therapy -Follow-up in 6 months or sooner if needed  Follow Up Instructions: 6 months, 03/25/2020 10:45   I discussed the assessment and treatment plan with the patient. The patient was provided an opportunity to ask questions and all were answered. The patient agreed with the plan and demonstrated an understanding of the instructions.   The patient was advised to call back or seek an in-person evaluation if the symptoms worsen or if the condition fails to improve as anticipated.  I spent 30 minutes of face-to-face and non-face-to-face time with patient.  This included previsit chart review, lab review, study review, order entry, electronic health record documentation, patient education.  Evangeline Dakin, DNP  [facility] [address], [place], [place] [address] [phone]

## 2019-09-29 ENCOUNTER — Other Ambulatory Visit: Payer: Self-pay | Admitting: Surgery

## 2019-09-29 DIAGNOSIS — C20 Malignant neoplasm of rectum: Secondary | ICD-10-CM

## 2019-10-06 DIAGNOSIS — Z8673 Personal history of transient ischemic attack (TIA), and cerebral infarction without residual deficits: Secondary | ICD-10-CM | POA: Diagnosis not present

## 2019-10-06 DIAGNOSIS — M1990 Unspecified osteoarthritis, unspecified site: Secondary | ICD-10-CM | POA: Diagnosis not present

## 2019-10-06 DIAGNOSIS — I110 Hypertensive heart disease with heart failure: Secondary | ICD-10-CM | POA: Diagnosis not present

## 2019-10-06 DIAGNOSIS — Z85048 Personal history of other malignant neoplasm of rectum, rectosigmoid junction, and anus: Secondary | ICD-10-CM | POA: Diagnosis not present

## 2019-10-06 DIAGNOSIS — E440 Moderate protein-calorie malnutrition: Secondary | ICD-10-CM | POA: Diagnosis not present

## 2019-10-06 DIAGNOSIS — I272 Pulmonary hypertension, unspecified: Secondary | ICD-10-CM | POA: Diagnosis not present

## 2019-10-06 DIAGNOSIS — E7800 Pure hypercholesterolemia, unspecified: Secondary | ICD-10-CM | POA: Diagnosis not present

## 2019-10-06 DIAGNOSIS — Z8701 Personal history of pneumonia (recurrent): Secondary | ICD-10-CM | POA: Diagnosis not present

## 2019-10-06 DIAGNOSIS — Z7951 Long term (current) use of inhaled steroids: Secondary | ICD-10-CM | POA: Diagnosis not present

## 2019-10-06 DIAGNOSIS — F0391 Unspecified dementia with behavioral disturbance: Secondary | ICD-10-CM | POA: Diagnosis not present

## 2019-10-06 DIAGNOSIS — Z9181 History of falling: Secondary | ICD-10-CM | POA: Diagnosis not present

## 2019-10-06 DIAGNOSIS — J449 Chronic obstructive pulmonary disease, unspecified: Secondary | ICD-10-CM | POA: Diagnosis not present

## 2019-10-06 DIAGNOSIS — R32 Unspecified urinary incontinence: Secondary | ICD-10-CM | POA: Diagnosis not present

## 2019-10-06 DIAGNOSIS — I4891 Unspecified atrial fibrillation: Secondary | ICD-10-CM | POA: Diagnosis not present

## 2019-10-06 DIAGNOSIS — Z7901 Long term (current) use of anticoagulants: Secondary | ICD-10-CM | POA: Diagnosis not present

## 2019-10-06 DIAGNOSIS — I5032 Chronic diastolic (congestive) heart failure: Secondary | ICD-10-CM | POA: Diagnosis not present

## 2019-10-09 DIAGNOSIS — I110 Hypertensive heart disease with heart failure: Secondary | ICD-10-CM | POA: Diagnosis not present

## 2019-10-09 DIAGNOSIS — E440 Moderate protein-calorie malnutrition: Secondary | ICD-10-CM | POA: Diagnosis not present

## 2019-10-09 DIAGNOSIS — I10 Essential (primary) hypertension: Secondary | ICD-10-CM | POA: Diagnosis not present

## 2019-10-09 DIAGNOSIS — J449 Chronic obstructive pulmonary disease, unspecified: Secondary | ICD-10-CM | POA: Diagnosis not present

## 2019-10-09 DIAGNOSIS — I5032 Chronic diastolic (congestive) heart failure: Secondary | ICD-10-CM | POA: Diagnosis not present

## 2019-10-09 DIAGNOSIS — M1990 Unspecified osteoarthritis, unspecified site: Secondary | ICD-10-CM | POA: Diagnosis not present

## 2019-10-09 DIAGNOSIS — F0391 Unspecified dementia with behavioral disturbance: Secondary | ICD-10-CM | POA: Diagnosis not present

## 2019-10-09 DIAGNOSIS — I272 Pulmonary hypertension, unspecified: Secondary | ICD-10-CM | POA: Diagnosis not present

## 2019-10-09 DIAGNOSIS — E785 Hyperlipidemia, unspecified: Secondary | ICD-10-CM | POA: Diagnosis not present

## 2019-10-10 DIAGNOSIS — E440 Moderate protein-calorie malnutrition: Secondary | ICD-10-CM | POA: Diagnosis not present

## 2019-10-10 DIAGNOSIS — M1990 Unspecified osteoarthritis, unspecified site: Secondary | ICD-10-CM | POA: Diagnosis not present

## 2019-10-10 DIAGNOSIS — I5032 Chronic diastolic (congestive) heart failure: Secondary | ICD-10-CM | POA: Diagnosis not present

## 2019-10-10 DIAGNOSIS — F0391 Unspecified dementia with behavioral disturbance: Secondary | ICD-10-CM | POA: Diagnosis not present

## 2019-10-10 DIAGNOSIS — J449 Chronic obstructive pulmonary disease, unspecified: Secondary | ICD-10-CM | POA: Diagnosis not present

## 2019-10-10 DIAGNOSIS — I110 Hypertensive heart disease with heart failure: Secondary | ICD-10-CM | POA: Diagnosis not present

## 2019-10-17 DIAGNOSIS — E440 Moderate protein-calorie malnutrition: Secondary | ICD-10-CM | POA: Diagnosis not present

## 2019-10-17 DIAGNOSIS — I110 Hypertensive heart disease with heart failure: Secondary | ICD-10-CM | POA: Diagnosis not present

## 2019-10-17 DIAGNOSIS — I5032 Chronic diastolic (congestive) heart failure: Secondary | ICD-10-CM | POA: Diagnosis not present

## 2019-10-17 DIAGNOSIS — M1990 Unspecified osteoarthritis, unspecified site: Secondary | ICD-10-CM | POA: Diagnosis not present

## 2019-10-17 DIAGNOSIS — J449 Chronic obstructive pulmonary disease, unspecified: Secondary | ICD-10-CM | POA: Diagnosis not present

## 2019-10-17 DIAGNOSIS — F0391 Unspecified dementia with behavioral disturbance: Secondary | ICD-10-CM | POA: Diagnosis not present

## 2019-10-18 NOTE — Progress Notes (Signed)
Megan Munoz   [phone] Fax:([phone]) [phone]   Clinic Follow up Note   Patient Care Team: Fanny Bien, MD as PCP - General (Family Medicine) Leonie Man, MD as PCP - Cardiology (Cardiology) Ronnette Juniper, MD as Consulting Physician (Gastroenterology) Virgina Evener, Dawn, RN (Inactive) as Oncology Nurse Navigator Michael Boston, MD as Consulting Physician (General Surgery) Truitt Merle, MD as Consulting Physician (Hematology) Kyung Rudd, MD as Consulting Physician (Radiation Oncology)  Date of Service:  10/23/2019  CHIEF COMPLAINT: F/u of rectal cancer  SUMMARY OF ONCOLOGIC HISTORY: Oncology History Overview Note  Cancer Staging Rectal cancer [facility]) Staging form: Colon and Rectum, AJCC 8th Edition - Clinical: Stage IIA (cT3, cN0, cM0) - Signed by Truitt Merle, MD on 09/21/2018    Rectal cancer ([place])  06/01/2018 Imaging   COLONOSCOPY - Firm, nodular, thickened, raised area felt at the anal verge found on perianal exam. 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 Biopsied. - Two 6 to 8 mm polyps in the transverse colon, removed with a hot snare. Resected and retrieved. - One 7 mm polyp in the ascending colon, removed with a hot snare. Resected and retrieved. - One 6 mm polyp in the cecum, removed with a hot snare. Resected and retrieved. - One 4 mm polyp at the hepatic flexure, removed with a cold biopsy forceps. Resected and retrieved. - One 9 mm polyp in the descending colon, removed with a hot snare. Resected and retrieved. - Diverticulosis in the sigmoid colon and in the descending colon.   06/01/2018 Initial Biopsy   Pathology from colonoscopy:  1. All colon polyps negative for malignancy. 2. Tubulovillous adenoma with at least high grade dysplasia at the rectal tumor. There were several foci highly suspicious but not definitive for invasive adenocarcinoma.    09/03/2018 Imaging   PELVIC MRI IMPRESSION: 1. 3.0 cm polypoid mass along the right anterolateral  aspect of the anal verge, as described above. 2. Given the location, it is unclear whether this is more appropriately characterized as rectal cancer versus anal cancer. Regardless, given the clinical information, rectal cancer imaging characterization has been provided. 3. Rectal adenocarcinoma T stage: T3d 4. Rectal adenocarcinoma N stage:  N0 5. Distance from tumor to the internal anal sphincter is 0 cm.   09/19/2018 Miscellaneous   TUMOR BOARD: concurrent radiation therapy and chemotherapy with Xeloda   09/21/2018 Initial Diagnosis   Rectal cancer ([place])   09/21/2018 Cancer Staging   Staging form: Colon and Rectum, AJCC 8th Edition - Clinical: Stage IIA (cT3, cN0, cM0) - Signed by Truitt Merle, MD on 09/21/2018   09/26/2018 - 11/07/2018 Radiation Therapy   ChemoRT with Xeloda beginning on 09/26/18 by Dr Lisbeth Renshaw ending on 11/07/18.    09/26/2018 - 11/07/2018 Chemotherapy   ChemoRT with Xeloda beginning on 09/26/18, Xeloda 1500mg  q12h on days of radiation. Dose reduced to 1000 mg on 7/27 PM and 7/28 AM; held Xeloda on 10/25/18 due to fatigue, chills, and UTI. Restarted on 10/31/18 at 1000mg  BID. Completed on 8/10.     03/15/2019 Imaging   MRI pelvis WO contrast IMPRESSION: Interval resolution of previously seen mass at the anorectal junction. No residual soft tissue mass visualized. No evidence of pelvic metastatic disease.      CURRENT THERAPY:  Surveillance   INTERVAL HISTORY:  Megan Munoz is here for a follow up of rectal cancer. She was last seen by me 9 months ago. She has been seen by NP Lacie in interim, last in 05/2019. She presents  to the clinic alone. Her daughter was called to be included in the visit. She notes she is doing well. She notes her BM a few times a day with 6-8 lumps at a time. She has mucus in her stool, no red blood. She denies stool incontinence, abdominal pain or nausea. She notes her eating is still low but improved. Her weight is stable.  She had a fall in June and had  bump on her head. She went to ED and did not have cranial bleeding. She does ambulate with walker, but she does not like to use it. She also sees PT. She also had Pneumonia in June before her fall. She was taking oral iron every other day but due to anemia, she increased to once daily. She did have Anoscope with last visit to Dr Johney Maine. She plans to have MRI in 2-3 weeks before next f/u with him.     REVIEW OF SYSTEMS:   Constitutional: Denies fevers, chills or abnormal weight loss (+) Improved low appetite  Eyes: Denies blurriness of vision Ears, nose, mouth, throat, and face: Denies mucositis or sore throat Respiratory: Denies cough, dyspnea or wheezes Cardiovascular: Denies palpitation, chest discomfort or lower extremity swelling Gastrointestinal:  Denies nausea, heartburn (+) Increased loose stool daily  Skin: Denies abnormal skin rashes Lymphatics: Denies new lymphadenopathy or easy bruising Neurological:Denies numbness, tingling or new weaknesses Behavioral/Psych: Mood is stable, no new changes  All other systems were reviewed with the patient and are negative.   MEDICAL HISTORY:  Past Medical History:  Diagnosis Date   Anxiety    Atherosclerosis of aortic arch [facility]) April 2017   Noted on CT chest, calcified aortic atherosclerosis at the arch.   Atrial fibrillation, permanent ([place]) 03/2013   Previously on Atenolol for Rate - now on combination diltiazem and carvedilol for rate Control;  & Xarelto for AC. CHADS2VASC2 = 7.   Cerebral infarction [facility]) 10/2008   MRI Brain 05/2014 for near syncope revealed: No acure infarct.  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   Mild small vessel disease type changes.  No intracranial hemorrhage.  Global atrophy.    COPD (chronic obstructive pulmonary disease) (HCC)    Coronary atherosclerosis due to calcified coronary lesion April 2017   Noted on  CT chest   Dementia [facility])    following 6568 stroke   Diastolic dysfunction with chronic heart failure ([place])    Exacerbated by A. fib RVR   Hyperlipemia    Hypertension    Pneumonia    PONV (postoperative nausea and vomiting)    Stroke ([place])    2010- patient has dementia related to stroke    Tinea unguium    Tremor    Tuberculosis     SURGICAL HISTORY: Past Surgical History:  Procedure Laterality Date   BIOPSY  06/01/2018   Procedure: BIOPSY;  Surgeon: Ronnette Juniper, MD;  Location: [facility];  Service: Gastroenterology;;   BLADDER SURGERY     CARPAL TUNNEL RELEASE Left    COLONOSCOPY N/A 06/01/2018   Procedure: COLONOSCOPY;  Surgeon: Ronnette Juniper, MD;  Location: [facility];  Service: Gastroenterology;  Laterality: N/A;   ESOPHAGOGASTRODUODENOSCOPY (EGD) WITH PROPOFOL N/A 02/04/2018   Procedure: ESOPHAGOGASTRODUODENOSCOPY (EGD) WITH PROPOFOL;  Surgeon: Ronnette Juniper, MD;  Location: [facility];  Service: Gastroenterology;  Laterality: N/A;   FOOT SURGERY Right    HERNIA REPAIR     NASAL SINUS SURGERY  POLYPECTOMY  06/01/2018   Procedure: POLYPECTOMY;  Surgeon: Ronnette Juniper, MD;  Location: [facility];  Service: Gastroenterology;;   TOTAL ABDOMINAL HYSTERECTOMY      I have reviewed the social history and family history with the patient and they are unchanged from previous note.  ALLERGIES:  is allergic to topiramate and tetanus toxoids.  MEDICATIONS:  Current Outpatient Medications  Medication Sig Dispense Refill   acetaminophen (TYLENOL) 500 MG tablet Take 1,000 mg by mouth every 6 (six) hours as needed for mild pain, fever or headache.     amoxicillin-clavulanate (AUGMENTIN) 875-125 MG tablet Take 1 tablet by mouth every 12 (twelve) hours. 10 tablet 0   Azelastine HCl 137 MCG/SPRAY SOLN Place 2 sprays into both nostrils 2 (two) times daily. Use in each nostril as directed      B Complex Vitamins (VITAMIN B COMPLEX PO) Take 1 tablet by mouth daily.        calcitonin, salmon, (MIACALCIN) 200 UNIT/ACT nasal spray Place 1 spray into alternate nostrils daily.     Cholecalciferol (VITAMIN D3 SUPER STRENGTH) 2000 units CAPS Take 2,000 Units by mouth daily.      diltiazem (CARDIZEM CD) 120 MG 24 hr capsule TAKE 240 MG (2 TABLETS) DAILY. IF DIZZY OR BLOOD PRESSURE IS LOW MAY TAKE 120 MG ONLY THAT DAY (Patient taking differently: Take 240 mg by mouth daily. TAKE 240 MG (2 TABLETS) DAILY. IF DIZZY OR BLOOD PRESSURE IS LOW MAY TAKE 120 MG ONLY THAT DAY) 360 capsule 3   donepezil (ARICEPT) 10 MG tablet TAKE 1 TABLET AT BEDTIME (Patient taking differently: Take 10 mg by mouth at bedtime. ) 90 tablet 3   doxycycline (VIBRAMYCIN) 100 MG capsule Take 1 capsule (100 mg total) by mouth 2 (two) times daily. 14 capsule 0   escitalopram (LEXAPRO) 20 MG tablet Take 20 mg by mouth daily.     feeding supplement, ENSURE ENLIVE, (ENSURE ENLIVE) LIQD Take 237 mLs by mouth 3 (three) times daily between meals. (Patient taking differently: Take 237 mLs by mouth See admin instructions. 2-3  Times daily) 237 mL 12   ferrous sulfate 325 (65 FE) MG tablet Take 1 tablet (325 mg total) by mouth every other day. (Patient taking differently: Take 325 mg by mouth every other day. Mon, Wed, Fri) 30 tablet 0   Fluticasone-Umeclidin-Vilant (TRELEGY ELLIPTA) 100-62.5-25 MCG/INH AEPB Inhale 1 puff into the lungs daily.     furosemide (LASIX) 20 MG tablet Take 1 tablet (20 mg total) by mouth daily as needed for fluid or edema. (Patient taking differently: Take 20 mg by mouth every other day. Mon, Wed, Fri) 30 tablet 6   lamoTRIgine (LAMICTAL) 100 MG tablet Take 1 tablet (100 mg total) by mouth 2 (two) times daily. 60 tablet 11   lamoTRIgine (LAMICTAL) 25 MG tablet 1 tab bid x one week 2 tab bid x 2nd week 3 tab bid x 3rd week 84 tablet 0   loratadine (CLARITIN) 10 MG tablet Take 10 mg by mouth daily.     Melatonin 10 MG TABS Take 10 mg by mouth at bedtime.      memantine  (NAMENDA) 10 MG tablet Take 1 tablet (10 mg total) by mouth 2 (two) times daily. 180 tablet 3   Multiple Vitamin (MULTIVITAMIN) capsule Take 1 capsule by mouth daily.     rivaroxaban (XARELTO) 10 MG TABS tablet Take 1 tablet (10 mg total) by mouth daily. 30 tablet 1   simvastatin (ZOCOR) 40 MG tablet Take 40  mg by mouth daily.     Wheat Dextrin (BENEFIBER DRINK MIX PO) Take 10 mLs by mouth daily. Mix with water      No current facility-administered medications for this visit.    PHYSICAL EXAMINATION: ECOG PERFORMANCE STATUS: 3 - Symptomatic, >50% confined to bed Weight 130 lbs, blood pressure 122/60, pulse 78, respirate of 16, temperature 99.1, pulse ox 100% on room air GENERAL:alert, no distress and comfortable SKIN: skin color, texture, turgor are normal, no rashes or significant lesions EYES: normal, Conjunctiva are pink and non-injected, sclera clear  NECK: supple, thyroid normal size, non-tender, without nodularity LYMPH:  no palpable lymphadenopathy in the cervical, axillary  LUNGS: clear to auscultation and percussion with normal breathing effort HEART: regular rate & rhythm and no murmurs and no lower extremity edema ABDOMEN:abdomen soft, non-tender and normal bowel sounds (+) mild abdominal tenderness, pt declined rectal exam today  Musculoskeletal:no cyanosis of digits and no clubbing  NEURO: alert & oriented x 3 with fluent speech, no focal motor/sensory deficits EXAM DONE IN WHEELCHAIR   LABORATORY DATA:  I have reviewed the data as listed CBC Latest Ref Rng & Units 10/23/2019 09/13/2019 09/04/2019  WBC 4.0 - 10.5 K/uL 3.3(L) 5.4 8.8  Hemoglobin 12.0 - 15.0 g/dL 10.8(L) 10.1(L) 10.8(L)  Hematocrit 36 - 46 % 35.3(L) 33.9(L) 34.6(L)  Platelets 150 - 400 K/uL 197 339 209     CMP Latest Ref Rng & Units 10/23/2019 09/13/2019 09/04/2019  Glucose 70 - 99 mg/dL 67(L) 103(H) 107(H)  BUN 8 - 23 mg/dL 20 16 18   Creatinine 0.44 - 1.00 mg/dL 0.77 0.61 0.67  Sodium 135 - 145 mmol/L  143 141 137  Potassium 3.5 - 5.1 mmol/L 3.6 3.8 3.6  Chloride 98 - 111 mmol/L 107 105 104  CO2 22 - 32 mmol/L 28 26 22   Calcium 8.9 - 10.3 mg/dL 9.9 9.1 8.9  Total Protein 6.5 - 8.1 g/dL 6.7 - 6.7  Total Bilirubin 0.3 - 1.2 mg/dL 0.4 - 0.7  Alkaline Phos 38 - 126 U/L 82 - 82  AST 15 - 41 U/L 21 - 44(H)  ALT 0 - 44 U/L 12 - 18      RADIOGRAPHIC STUDIES: I have personally reviewed the radiological images as listed and agreed with the findings in the report. No results found.   ASSESSMENT & PLAN:  Megan Munoz is a 86 y.o. female with    1.Distalrenaladenocarcinoma, cT3N0M0 stage IIA, status post concurrent chemoradiation -She was diagnosed in 05/2018.Biopsy showed tubulovillous adenoma with at least high-grade dysplasia, and they arehighly suspicious but not definitive invasive adenocarcinoma.Her pelvic MRI showed a distal rectal mass,clinical T3 lesion, no abnormal nodes.  -She completed concurrent ChemoRT with Xeloda1500mg  q12h. She did not proceed with surgery due to her advanced age and medical comorbidities. -Surveillance MRI on 03/15/19 showed no evidence of residual or recurrent mass or metastatic disease in the pelvis. -Anoscopy on 06/07/19 showed scar tissue, otherwise no evidence of recurrence  -Dr. Therisa Doyne does not recommend further colonoscopy due to her age and medical comorbidities, which is reasonable. -She is mostly stable. Labs reviewed, CBC and CMP WNL except WBC 3.3, Hg 10.8, BG 67. CEA still pending. Physical exam shows mild tenderness, but soft abdomen, will monitor.   -Given low BG, although asymptomatic, I recommend she eat something or drink orange juice before leaving clinic. She is agreeable.  -Next MRI in 2-3 weeks before next visit with Dr. Johney Maine.  Her daughter will call to schedule -Continue clinical surveillance, I  did not plan to repeat a whole-body CT scans unless she has concerning symptoms for metastatic disease --F/u in 4 months.     2. Anemia    -Mild currently. She has been on oral iron every other day.  -Hg 10.8 today (10/23/19). She denies GI bleeding. She can increase her oral iron to 2 tabs every other day.  -I discussed watching for fatigue and SOB, indicating worsened anemia. She will also f/u with PCP to repeat labs before next visit.  -will check iron study next time    3. Anorexia, weight loss  -She lost weight during chemoRT but was able to gain some back. She weighed 140 lbs after completing treatment. -Her daughter was concerned about weight loss and I started her on Megace. She will continue to f/u with dietician  -Ultimately it was felt her anorexia (and cognitive decline) were related to topamax which she was on for tremors. She stopped this and appetite and cognition improved.  -Megan Munoz is stable, Her lower appetite has improved. No longer on Megace. Will continue Ensure and eat more daily to try to gain weight.   4. Other co-morbidities - HTN, CHF, depression, anxiety, h/o stroke, dementia  -continue routine meds and f/u with providers -pt is able to do all ADLs, lives with her daughter and son-in-law, has good support who is very involved in her care -OT/PT involved for evaluation/managementof cognitive decline and functional limitations  -cognitive status improved after discontinuing topamax  -She did have PNA in early June. She [facility] which results in lump of head, no cranial bleeding. I strongly encouraged he rot ambulate with walker.    PLAN: -Lab and F/u in 4 months  -Proceed with pelvic MRI and F/u with Gross soon    No problem-specific Assessment & Plan notes found for this encounter.   Orders Placed This Encounter  Procedures   Iron and TIBC    Standing Status:   Future    Standing Expiration Date:   10/22/2020   Ferritin    Standing Status:   Future    Standing Expiration Date:   10/22/2020   Folate RBC    Standing Status:   Future    Standing Expiration Date:    10/22/2020   Methylmalonic acid, serum    Standing Status:   Future    Standing Expiration Date:   10/22/2020   All questions were answered. The patient knows to call the clinic with any problems, questions or concerns. No barriers to learning was detected. The total time spent in the appointment was 30 minutes.     Truitt Merle, MD 10/23/2019   I, Joslyn Devon, am acting as scribe for Truitt Merle, MD.   I have reviewed the above documentation for accuracy and completeness, and I agree with the above.

## 2019-10-23 ENCOUNTER — Inpatient Hospital Stay: Payer: Medicare Other

## 2019-10-23 ENCOUNTER — Encounter: Payer: Self-pay | Admitting: Hematology

## 2019-10-23 ENCOUNTER — Inpatient Hospital Stay: Payer: Medicare Other | Attending: Hematology | Admitting: Hematology

## 2019-10-23 ENCOUNTER — Other Ambulatory Visit: Payer: Self-pay

## 2019-10-23 DIAGNOSIS — I63312 Cerebral infarction due to thrombosis of left middle cerebral artery: Secondary | ICD-10-CM | POA: Diagnosis not present

## 2019-10-23 DIAGNOSIS — C20 Malignant neoplasm of rectum: Secondary | ICD-10-CM

## 2019-10-23 DIAGNOSIS — D649 Anemia, unspecified: Secondary | ICD-10-CM | POA: Diagnosis not present

## 2019-10-23 DIAGNOSIS — E785 Hyperlipidemia, unspecified: Secondary | ICD-10-CM | POA: Diagnosis not present

## 2019-10-23 DIAGNOSIS — F419 Anxiety disorder, unspecified: Secondary | ICD-10-CM | POA: Insufficient documentation

## 2019-10-23 DIAGNOSIS — Z923 Personal history of irradiation: Secondary | ICD-10-CM | POA: Diagnosis not present

## 2019-10-23 DIAGNOSIS — Z9221 Personal history of antineoplastic chemotherapy: Secondary | ICD-10-CM | POA: Diagnosis not present

## 2019-10-23 DIAGNOSIS — Z7901 Long term (current) use of anticoagulants: Secondary | ICD-10-CM | POA: Insufficient documentation

## 2019-10-23 DIAGNOSIS — I4821 Permanent atrial fibrillation: Secondary | ICD-10-CM | POA: Diagnosis not present

## 2019-10-23 DIAGNOSIS — I110 Hypertensive heart disease with heart failure: Secondary | ICD-10-CM | POA: Diagnosis not present

## 2019-10-23 DIAGNOSIS — F329 Major depressive disorder, single episode, unspecified: Secondary | ICD-10-CM | POA: Insufficient documentation

## 2019-10-23 DIAGNOSIS — I509 Heart failure, unspecified: Secondary | ICD-10-CM | POA: Insufficient documentation

## 2019-10-23 DIAGNOSIS — J449 Chronic obstructive pulmonary disease, unspecified: Secondary | ICD-10-CM | POA: Insufficient documentation

## 2019-10-23 DIAGNOSIS — Z79899 Other long term (current) drug therapy: Secondary | ICD-10-CM | POA: Diagnosis not present

## 2019-10-23 DIAGNOSIS — F0390 Unspecified dementia without behavioral disturbance: Secondary | ICD-10-CM | POA: Insufficient documentation

## 2019-10-23 DIAGNOSIS — Z85048 Personal history of other malignant neoplasm of rectum, rectosigmoid junction, and anus: Secondary | ICD-10-CM | POA: Diagnosis not present

## 2019-10-23 LAB — CMP (CANCER CENTER ONLY)
ALT: 12 U/L (ref 0–44)
AST: 21 U/L (ref 15–41)
Albumin: 3.9 g/dL (ref 3.5–5.0)
Alkaline Phosphatase: 82 U/L (ref 38–126)
Anion gap: 8 (ref 5–15)
BUN: 20 mg/dL (ref 8–23)
CO2: 28 mmol/L (ref 22–32)
Calcium: 9.9 mg/dL (ref 8.9–10.3)
Chloride: 107 mmol/L (ref 98–111)
Creatinine: 0.77 mg/dL (ref 0.44–1.00)
GFR, Est AFR Am: >60 mL/min (ref >60)
GFR, Estimated: >60 mL/min (ref >60)
Glucose, Bld: 67 mg/dL — ABNORMAL LOW (ref 70–99)
Potassium: 3.6 mmol/L (ref 3.5–5.1)
Sodium: 143 mmol/L (ref 135–145)
Total Bilirubin: 0.4 mg/dL (ref 0.3–1.2)
Total Protein: 6.7 g/dL (ref 6.5–8.1)

## 2019-10-23 LAB — CBC WITH DIFFERENTIAL (CANCER CENTER ONLY)
Abs Immature Granulocytes: 0.01 10*3/uL (ref 0.00–0.07)
Basophils Absolute: 0 10*3/uL (ref 0.0–0.1)
Basophils Relative: 0 %
Eosinophils Absolute: 0 10*3/uL (ref 0.0–0.5)
Eosinophils Relative: 0 %
HCT: 35.3 % — ABNORMAL LOW (ref 36.0–46.0)
Hemoglobin: 10.8 g/dL — ABNORMAL LOW (ref 12.0–15.0)
Immature Granulocytes: 0 %
Lymphocytes Relative: 15 %
Lymphs Abs: 0.5 10*3/uL — ABNORMAL LOW (ref 0.7–4.0)
MCH: 30.3 pg (ref 26.0–34.0)
MCHC: 30.6 g/dL (ref 30.0–36.0)
MCV: 98.9 fL (ref 80.0–100.0)
Monocytes Absolute: 0.4 10*3/uL (ref 0.1–1.0)
Monocytes Relative: 12 %
Neutro Abs: 2.4 10*3/uL (ref 1.7–7.7)
Neutrophils Relative %: 73 %
Platelet Count: 197 10*3/uL (ref 150–400)
RBC: 3.57 MIL/uL — ABNORMAL LOW (ref 3.87–5.11)
RDW: 15.7 % — ABNORMAL HIGH (ref 11.5–15.5)
WBC Count: 3.3 10*3/uL — ABNORMAL LOW (ref 4.0–10.5)
nRBC: 0 % (ref 0.0–0.2)

## 2019-10-23 LAB — CEA (IN HOUSE-CHCC): CEA (CHCC-In House): 2.96 ng/mL (ref 0.00–5.00)

## 2019-10-24 ENCOUNTER — Telehealth: Payer: Self-pay

## 2019-10-24 ENCOUNTER — Telehealth: Payer: Self-pay | Admitting: Hematology

## 2019-10-24 NOTE — Telephone Encounter (Signed)
LM to cb for results.

## 2019-10-24 NOTE — Telephone Encounter (Signed)
-----   Message from Alla Feeling, NP sent at 10/24/2019  8:18 AM EDT ----- Please let patient/daughter know CEA tumor marker is normal Thanks, Regan Rakers

## 2019-10-24 NOTE — Telephone Encounter (Signed)
Called and spoke with daughter Megan Munoz (In chart to call her or son in law) and notified her that Megan Munoz CEA tumor marker is normal. Daughter Megan Munoz verbalized understanding.

## 2019-10-24 NOTE — Telephone Encounter (Signed)
Scheduled per 7/26 los. Spoke with pt's daughter kim and is aware of appt times and date.

## 2019-10-26 DIAGNOSIS — I110 Hypertensive heart disease with heart failure: Secondary | ICD-10-CM | POA: Diagnosis not present

## 2019-10-26 DIAGNOSIS — E440 Moderate protein-calorie malnutrition: Secondary | ICD-10-CM | POA: Diagnosis not present

## 2019-10-26 DIAGNOSIS — M1990 Unspecified osteoarthritis, unspecified site: Secondary | ICD-10-CM | POA: Diagnosis not present

## 2019-10-26 DIAGNOSIS — J449 Chronic obstructive pulmonary disease, unspecified: Secondary | ICD-10-CM | POA: Diagnosis not present

## 2019-10-26 DIAGNOSIS — F0391 Unspecified dementia with behavioral disturbance: Secondary | ICD-10-CM | POA: Diagnosis not present

## 2019-10-26 DIAGNOSIS — I5032 Chronic diastolic (congestive) heart failure: Secondary | ICD-10-CM | POA: Diagnosis not present

## 2019-10-31 ENCOUNTER — Other Ambulatory Visit: Payer: Self-pay | Admitting: *Deleted

## 2019-10-31 MED ORDER — LAMOTRIGINE 100 MG PO TABS: 100.0000 mg | ORAL_TABLET | Freq: Two times a day (BID) | ORAL | 1 refills | Status: AC

## 2019-11-05 DIAGNOSIS — R32 Unspecified urinary incontinence: Secondary | ICD-10-CM | POA: Diagnosis not present

## 2019-11-05 DIAGNOSIS — M1990 Unspecified osteoarthritis, unspecified site: Secondary | ICD-10-CM | POA: Diagnosis not present

## 2019-11-05 DIAGNOSIS — E440 Moderate protein-calorie malnutrition: Secondary | ICD-10-CM | POA: Diagnosis not present

## 2019-11-05 DIAGNOSIS — I4891 Unspecified atrial fibrillation: Secondary | ICD-10-CM | POA: Diagnosis not present

## 2019-11-05 DIAGNOSIS — Z8673 Personal history of transient ischemic attack (TIA), and cerebral infarction without residual deficits: Secondary | ICD-10-CM | POA: Diagnosis not present

## 2019-11-05 DIAGNOSIS — Z8701 Personal history of pneumonia (recurrent): Secondary | ICD-10-CM | POA: Diagnosis not present

## 2019-11-05 DIAGNOSIS — I272 Pulmonary hypertension, unspecified: Secondary | ICD-10-CM | POA: Diagnosis not present

## 2019-11-05 DIAGNOSIS — I5032 Chronic diastolic (congestive) heart failure: Secondary | ICD-10-CM | POA: Diagnosis not present

## 2019-11-05 DIAGNOSIS — E7800 Pure hypercholesterolemia, unspecified: Secondary | ICD-10-CM | POA: Diagnosis not present

## 2019-11-05 DIAGNOSIS — J449 Chronic obstructive pulmonary disease, unspecified: Secondary | ICD-10-CM | POA: Diagnosis not present

## 2019-11-05 DIAGNOSIS — I110 Hypertensive heart disease with heart failure: Secondary | ICD-10-CM | POA: Diagnosis not present

## 2019-11-05 DIAGNOSIS — Z85048 Personal history of other malignant neoplasm of rectum, rectosigmoid junction, and anus: Secondary | ICD-10-CM | POA: Diagnosis not present

## 2019-11-05 DIAGNOSIS — F0391 Unspecified dementia with behavioral disturbance: Secondary | ICD-10-CM | POA: Diagnosis not present

## 2019-11-05 DIAGNOSIS — Z9181 History of falling: Secondary | ICD-10-CM | POA: Diagnosis not present

## 2019-11-05 DIAGNOSIS — Z7901 Long term (current) use of anticoagulants: Secondary | ICD-10-CM | POA: Diagnosis not present

## 2019-11-05 DIAGNOSIS — Z7951 Long term (current) use of inhaled steroids: Secondary | ICD-10-CM | POA: Diagnosis not present

## 2019-11-07 ENCOUNTER — Telehealth: Payer: Self-pay | Admitting: Cardiology

## 2019-11-07 NOTE — Telephone Encounter (Signed)
LVM for patient to return call to get follow up scheduled with [facility] from recall list 

## 2019-11-14 DIAGNOSIS — J449 Chronic obstructive pulmonary disease, unspecified: Secondary | ICD-10-CM | POA: Diagnosis not present

## 2019-11-14 DIAGNOSIS — I5032 Chronic diastolic (congestive) heart failure: Secondary | ICD-10-CM | POA: Diagnosis not present

## 2019-11-14 DIAGNOSIS — E440 Moderate protein-calorie malnutrition: Secondary | ICD-10-CM | POA: Diagnosis not present

## 2019-11-14 DIAGNOSIS — M1990 Unspecified osteoarthritis, unspecified site: Secondary | ICD-10-CM | POA: Diagnosis not present

## 2019-11-14 DIAGNOSIS — I110 Hypertensive heart disease with heart failure: Secondary | ICD-10-CM | POA: Diagnosis not present

## 2019-11-14 DIAGNOSIS — F0391 Unspecified dementia with behavioral disturbance: Secondary | ICD-10-CM | POA: Diagnosis not present

## 2019-11-17 DIAGNOSIS — F411 Generalized anxiety disorder: Secondary | ICD-10-CM | POA: Diagnosis not present

## 2019-11-17 DIAGNOSIS — J449 Chronic obstructive pulmonary disease, unspecified: Secondary | ICD-10-CM | POA: Diagnosis not present

## 2019-11-17 DIAGNOSIS — F0390 Unspecified dementia without behavioral disturbance: Secondary | ICD-10-CM | POA: Diagnosis not present

## 2019-11-17 DIAGNOSIS — I509 Heart failure, unspecified: Secondary | ICD-10-CM | POA: Diagnosis not present

## 2019-11-17 DIAGNOSIS — D649 Anemia, unspecified: Secondary | ICD-10-CM | POA: Diagnosis not present

## 2019-11-17 DIAGNOSIS — I4891 Unspecified atrial fibrillation: Secondary | ICD-10-CM | POA: Diagnosis not present

## 2019-11-29 DIAGNOSIS — J449 Chronic obstructive pulmonary disease, unspecified: Secondary | ICD-10-CM | POA: Diagnosis not present

## 2019-11-29 DIAGNOSIS — I110 Hypertensive heart disease with heart failure: Secondary | ICD-10-CM | POA: Diagnosis not present

## 2019-11-29 DIAGNOSIS — F0391 Unspecified dementia with behavioral disturbance: Secondary | ICD-10-CM | POA: Diagnosis not present

## 2019-11-29 DIAGNOSIS — E440 Moderate protein-calorie malnutrition: Secondary | ICD-10-CM | POA: Diagnosis not present

## 2019-11-29 DIAGNOSIS — I5032 Chronic diastolic (congestive) heart failure: Secondary | ICD-10-CM | POA: Diagnosis not present

## 2019-11-29 DIAGNOSIS — M1990 Unspecified osteoarthritis, unspecified site: Secondary | ICD-10-CM | POA: Diagnosis not present

## 2019-12-18 ENCOUNTER — Telehealth: Payer: Self-pay | Admitting: *Deleted

## 2019-12-18 NOTE — Telephone Encounter (Signed)
°  Patient Consent for Virtual Visit         Megan Munoz has provided verbal consent on 12/18/2019 for a virtual visit (video or telephone).   CONSENT FOR VIRTUAL VISIT FOR:  Megan Munoz  By participating in this virtual visit I agree to the following:  I hereby voluntarily request, consent and authorize [facility] and its employed or contracted physicians, physician assistants, nurse practitioners or other licensed health care professionals (the Practitioner), to provide me with telemedicine health care services (the Services") as deemed necessary by the treating Practitioner. I acknowledge and consent to receive the Services by the Practitioner via telemedicine. I understand that the telemedicine visit will involve communicating with the Practitioner through live audiovisual communication technology and the disclosure of certain medical information by electronic transmission. I acknowledge that I have been given the opportunity to request an in-person assessment or other available alternative prior to the telemedicine visit and am voluntarily participating in the telemedicine visit.  I understand that I have the right to withhold or withdraw my consent to the use of telemedicine in the course of my care at any time, without affecting my right to future care or treatment, and that the Practitioner or I may terminate the telemedicine visit at any time. I understand that I have the right to inspect all information obtained and/or recorded in the course of the telemedicine visit and may receive copies of available information for a reasonable fee.  I understand that some of the potential risks of receiving the Services via telemedicine include:   Delay or interruption in medical evaluation due to technological equipment failure or disruption;  Information transmitted may not be sufficient (e.g. poor resolution of images) to allow for appropriate medical decision making by the Practitioner; and/or     In rare instances, security protocols could fail, causing a breach of personal health information.  Furthermore, I acknowledge that it is my responsibility to provide information about my medical history, conditions and care that is complete and accurate to the best of my ability. I acknowledge that Practitioner's advice, recommendations, and/or decision may be based on factors not within their control, such as incomplete or inaccurate data provided by me or distortions of diagnostic images or specimens that may result from electronic transmissions. I understand that the practice of medicine is not an exact science and that Practitioner makes no warranties or guarantees regarding treatment outcomes. I acknowledge that a copy of this consent can be made available to me via my patient portal ([place]), or I can request a printed copy by calling the office of [facility].    I understand that my insurance will be billed for this visit.   I have read or had this consent read to me.  I understand the contents of this consent, which adequately explains the benefits and risks of the Services being provided via telemedicine.   I have been provided ample opportunity to ask questions regarding this consent and the Services and have had my questions answered to my satisfaction.  I give my informed consent for the services to be provided through the use of telemedicine in my medical care

## 2019-12-21 DIAGNOSIS — M25551 Pain in right hip: Secondary | ICD-10-CM | POA: Diagnosis not present

## 2019-12-22 ENCOUNTER — Encounter: Payer: Self-pay | Admitting: Cardiology

## 2019-12-22 ENCOUNTER — Telehealth (INDEPENDENT_AMBULATORY_CARE_PROVIDER_SITE_OTHER): Payer: Medicare Other | Admitting: Cardiology

## 2019-12-22 VITALS — Ht 66.0 in | Wt 130.0 lb

## 2019-12-22 DIAGNOSIS — I10 Essential (primary) hypertension: Secondary | ICD-10-CM | POA: Diagnosis not present

## 2019-12-22 DIAGNOSIS — I272 Pulmonary hypertension, unspecified: Secondary | ICD-10-CM | POA: Diagnosis not present

## 2019-12-22 DIAGNOSIS — R600 Localized edema: Secondary | ICD-10-CM

## 2019-12-22 DIAGNOSIS — C20 Malignant neoplasm of rectum: Secondary | ICD-10-CM

## 2019-12-22 DIAGNOSIS — I5032 Chronic diastolic (congestive) heart failure: Secondary | ICD-10-CM

## 2019-12-22 DIAGNOSIS — R269 Unspecified abnormalities of gait and mobility: Secondary | ICD-10-CM | POA: Diagnosis not present

## 2019-12-22 DIAGNOSIS — E782 Mixed hyperlipidemia: Secondary | ICD-10-CM

## 2019-12-22 DIAGNOSIS — I63312 Cerebral infarction due to thrombosis of left middle cerebral artery: Secondary | ICD-10-CM

## 2019-12-22 DIAGNOSIS — I4821 Permanent atrial fibrillation: Secondary | ICD-10-CM

## 2019-12-22 MED ORDER — RIVAROXABAN 10 MG PO TABS: 10.0000 mg | ORAL_TABLET | Freq: Every day | ORAL | 3 refills | Status: AC

## 2019-12-22 NOTE — Patient Instructions (Addendum)
Medication Instructions:  No change  *If you need a refill on your cardiac medications before your next appointment, please call your pharmacy*   Lab Work: None  If you have labs (blood work) drawn today and your tests are completely normal, you will receive your results only by: Marland Kitchen MyChart Message (if you have MyChart) OR . A paper copy in the mail If you have any lab test that is abnormal or we need to change your treatment, we will call you to review the results.   Testing/Procedures: None   Follow-Up: At [facility], you and your health needs are our priority.  As part of our continuing mission to provide you with exceptional heart care, we have created designated Provider Care Teams.  These Care Teams include your primary Cardiologist (physician) and Advanced Practice Providers (APPs -  Physician Assistants and Nurse Practitioners) who all work together to provide you with the care you need, when you need it.  We recommend signing up for the patient portal called "MyChart".  Sign up information is provided on this After Visit Summary.  MyChart is used to connect with patients for Virtual Visits (Telemedicine).  Patients are able to view lab/test results, encounter notes, upcoming appointments, etc.  Non-urgent messages can be sent to your provider as well.   To learn more about what you can do with MyChart, go to [email or web address].    Your next appointment:   1 year(s)  The format for your next appointment:   In Person  Provider:   Glenetta Hew, MD   Other Instructions Stay active, walk around as much you can and make sure you eat plenty of food Your physician wants you to follow-up in: 1 year with Dr. Ellyn Hack.  You will receive a reminder letter in the mail two months in advance. If you don't receive a letter, please call our office to schedule the follow-up appointment.

## 2019-12-22 NOTE — Assessment & Plan Note (Signed)
Continue elevating feet, compression stockings with 4 times a week Lasix.  Seems to be relatively stable.

## 2019-12-22 NOTE — Assessment & Plan Note (Signed)
Rates pretty much well controlled on diltiazem.  Remains on low-dose Xarelto with no bleeding issues.  Remains asymptomatic with no diastolic heart failure episodes.

## 2019-12-22 NOTE — Assessment & Plan Note (Signed)
No significant dyspnea.  I suspect that this was probably related to the fact that she was having a pneumonia when the echo was checked. No dyspnea.  On stable dose of diltiazem. Thus the symptoms change, would not recheck echo

## 2019-12-22 NOTE — Assessment & Plan Note (Signed)
Remains on simvastatin.  Would have low threshold to consider changing statin based on her being on diltiazem, but also with memory issues.  I did not choose to make any changes at this point because she is stable, however if lipid panel is stable, would consider switching to lower dose (20 mg) rosuvastatin

## 2019-12-22 NOTE — Assessment & Plan Note (Signed)
She is no longer on beta-blocker or ACE inhibitor.  Only on diltiazem and pressures seem to be stable based on home readings.  Continue to err on the side of permissive hypertension to avoid orthostasis.

## 2019-12-22 NOTE — Assessment & Plan Note (Signed)
I really think she has diastolic dysfunction and is symptomatic when she goes to A. fib RVR as a result.  Does not really have baseline heart failure symptoms.  Edema does not seem to be as much related to heart failure as it is just simply venous stasis. Continue current dose of diltiazem.  She is taking her Lasix 4 days a week and has not required any additional dosing. Would be reluctant to push her blood pressure medications too far in order to avoid complicating her already tenuous balance.  Orthostatic symptoms.

## 2019-12-22 NOTE — Assessment & Plan Note (Signed)
If she continues to have more falls, we may need to reconsider DOAC, for now I think risks are still outweighed by benefits.Megan Munoz

## 2019-12-22 NOTE — Progress Notes (Signed)
Virtual Visit via Telephone Note   This visit type was conducted due to national recommendations for restrictions regarding the COVID-19 Pandemic (e.g. social distancing) in an effort to limit this patient's exposure and mitigate transmission in our community.  Due to her co-morbid illnesses, this patient is at least at moderate risk for complications without adequate follow up.  This format is felt to be most appropriate for this patient at this time.  The patient did not have access to video technology/had technical difficulties with video requiring transitioning to audio format only (telephone).  All issues noted in this document were discussed and addressed.  No physical exam could be performed with this format.  Please refer to the patient's chart for her  consent to telehealth for [facility].   Patient has given verbal permission to conduct this visit via virtual appointment and to bill insurance 12/22/2019 11:30 AM     Evaluation Performed:  Follow-up visit  Date:  12/22/2019   ID:  Megan Munoz, DOB 03/24/1933, MRN [id number]  Patient Location: Home Provider Location: Office/Clinic  PCP:  Fanny Bien, MD  Cardiologist:  Glenetta Hew, MD  Electrophysiologist:  None   Chief Complaint:   Chief Complaint  Patient presents with  . Follow-up    Annual  . Atrial Fibrillation    No real symptoms of fast heart rates; no syncope or near syncope    History of Present Illness:    Megan Munoz is a 86 y.o. female with PMH notable for [place] who presents via audio/video conferencing for a telehealth visit today as an annual follow-up.  Megan Munoz was last seen January 19, 2019.  Conversation was mostly with son-in-law and the daughter on the telephone.  Prolonged visit.  Hospitalizations:  . September 04, 2019-right lower lobe pneumonia -> presentation was AMS and fall.  Apparently she got up to the bathroom in the morning and went back to the room to get back  into bed but must have missed the bed and fell on the floor.  Does not remember the fall.  Apparently this occurred at roughly 4:30 in the morning because she pushed her alert button.  She was able to get back into bed with the assistance of EMS, but presented to emergency room for evaluation in the morning because she felt quite weak with difficulty walking.  Had borderline oxygen saturations with tachycardia A. fib RVR) and a low-grade temperature.  Had not been eating and drinking well.  Presumptive diagnosis of pneumonia based on findings on chest x-ray.  Treated with IV fluids plus antibiotics.  Was discharged home to "hospital at home program . September 13, 2019, fall with scalp hematoma; ER note indicates that she tripped and fell, however of daughter indicates that Kalsey had gotten up to go to the bathroom and apparently while trying to stand up from the commode she went to grab the handle and missed then fell forward and hit her head.  No LOC.  She just does not remember the events.  Negative head CT.  Chest x-ray still showed right lower lobe infiltrate.   Recent - Interim CV studies:   The following studies were reviewed today: . None:  Inerval History   Megan Munoz is dong relatively well -- since her PNA.  (Interview led by daughter).   2 falls - 1 x fell out of bed. 2nd - cannot recall.--Each lead to ER visit as noted above.  [facility] comes out to see her along  with HH PT (at least once every other week since March) -- BP levels have always been good.  No LOW. No swelling. Not short of breath - but can get worn out with PT.  Needs to catch her breath after short walks.   Still has some dizziness with poor balance.  No other falls.  No syncope or near syncope.  For the most part has been pretty stable from a cardiac standpoint.  Heart rate was fast when she went to the emergency room on 7 June, but has not really been all that notably fast since.  Did not routinely check her heart rates,  but she has not complained of any palpitations or irregular heartbeats.  The caveat is that she does not complain of much of anything anyway  Cardiovascular ROS: no chest pain or dyspnea on exertion positive for - dizziness, unsteady gait. 1 unexplained fall) negative for - chest pain, dyspnea on exertion, edema, irregular heartbeat, orthopnea, palpitations, paroxysmal nocturnal dyspnea, rapid heart rate, shortness of breath or syncope/near syncope; TIA, amaurosis fugax -- NO LOC with fall & heat trauma. ]   ROS:  Please see the history of present illness.    The patient does not have symptoms concerning for COVID-19 infection (fever, chills, cough, or new shortness of breath).  Review of Systems  Constitutional: Positive for weight loss (around her illness in June - not eating.  Now doing better & eating more). Negative for malaise/fatigue.  HENT: Negative for congestion.   Respiratory: Negative for shortness of breath.        None recently  Cardiovascular: Negative for leg swelling.  Gastrointestinal: Positive for abdominal pain. Negative for blood in stool, constipation, diarrhea and melena.  Genitourinary: Positive for dysuria. Negative for hematuria.  Musculoskeletal: Positive for falls. Negative for joint pain.  Neurological: Positive for dizziness. Negative for speech change, focal weakness and weakness.       Unsteady gait  Psychiatric/Behavioral: Positive for memory loss. The patient is not nervous/anxious and does not have insomnia.     The patient is practicing social distancing.  Past Medical History:  Diagnosis Date  . Anxiety   . Atherosclerosis of aortic arch [facility]) April 2017   Noted on CT chest, calcified aortic atherosclerosis at the arch.  . Atrial fibrillation, permanent ([place]) 03/2013   Previously on Atenolol for Rate - now on combination diltiazem and carvedilol for rate Control;  & Xarelto for AC. CHADS2VASC2 = 7.  . Cerebral infarction [facility]) 10/2008   MRI Brain  05/2014 for near syncope revealed: No acure infarct.  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   Mild small vessel disease type changes.  No intracranial hemorrhage.  Global atrophy.   Marland Kitchen COPD (chronic obstructive pulmonary disease) ([place])   . Coronary atherosclerosis due to calcified coronary lesion April 2017   Noted on CT chest  . Dementia ([place])    following 2010 stroke  . Diastolic dysfunction with chronic heart failure (HCC)    Exacerbated by A. fib RVR  . Hyperlipemia   . Hypertension   . Pneumonia   . PONV (postoperative nausea and vomiting)   . Stroke [facility])    2010- patient has dementia related to stroke   . Tinea unguium   . Tremor   . Tuberculosis    Past Surgical History:  Procedure Laterality Date  . BIOPSY  06/01/2018   Procedure: BIOPSY;  Surgeon:  Ronnette Juniper, MD;  Location: [facility];  Service: Gastroenterology;;  . BLADDER SURGERY    . CARPAL TUNNEL RELEASE Left   . COLONOSCOPY N/A 06/01/2018   Procedure: COLONOSCOPY;  Surgeon: Ronnette Juniper, MD;  Location: [facility];  Service: Gastroenterology;  Laterality: N/A;  . ESOPHAGOGASTRODUODENOSCOPY (EGD) WITH PROPOFOL N/A 02/04/2018   Procedure: ESOPHAGOGASTRODUODENOSCOPY (EGD) WITH PROPOFOL;  Surgeon: Ronnette Juniper, MD;  Location: [place];  Service: Gastroenterology;  Laterality: N/A;  . FOOT SURGERY Right   . HERNIA REPAIR    . NASAL SINUS SURGERY    . POLYPECTOMY  06/01/2018   Procedure: POLYPECTOMY;  Surgeon: Ronnette Juniper, MD;  Location: [facility];  Service: Gastroenterology;;  . TOTAL ABDOMINAL HYSTERECTOMY      Immunization History  Administered Date(s) Administered  . Fluad Quad(high Dose 65+) 01/12/2019  . Influenza Split 12/29/2014  . Influenza-Unspecified 12/04/2015, 01/28/2018  . PFIZER SARS-COV-2 Vaccination 05/04/2019, 05/29/2019  . Pneumococcal-Unspecified 12/04/2015    Current Meds  Medication Sig  .  acetaminophen (TYLENOL) 500 MG tablet Take 1,000 mg by mouth every 6 (six) hours as needed for mild pain, fever or headache.  . Azelastine HCl 137 MCG/SPRAY SOLN Place 2 sprays into both nostrils 2 (two) times daily. Use in each nostril as directed   . B Complex Vitamins (VITAMIN B COMPLEX PO) Take 1 tablet by mouth daily.   . calcitonin, salmon, (MIACALCIN) 200 UNIT/ACT nasal spray Place 1 spray into alternate nostrils daily.  . Cholecalciferol (VITAMIN D3 SUPER STRENGTH) 2000 units CAPS Take 2,000 Units by mouth daily.   Marland Kitchen diltiazem (CARDIZEM CD) 120 MG 24 hr capsule TAKE 240 MG (2 TABLETS) DAILY. IF DIZZY OR BLOOD PRESSURE IS LOW MAY TAKE 120 MG ONLY THAT DAY (Patient taking differently: Take 240 mg by mouth daily. TAKE 240 MG (2 TABLETS) DAILY. IF DIZZY OR BLOOD PRESSURE IS LOW MAY TAKE 120 MG ONLY THAT DAY)  . donepezil (ARICEPT) 10 MG tablet Take 10 mg by mouth at bedtime.  . Ensure (ENSURE) Take 237 mLs by mouth 3 (three) times daily between meals.  Marland Kitchen escitalopram (LEXAPRO) 20 MG tablet Take 20 mg by mouth daily.  . ferrous sulfate 325 (65 FE) MG EC tablet Take 325 mg by mouth every other day.  . Fluticasone-Umeclidin-Vilant (TRELEGY ELLIPTA) 100-62.5-25 MCG/INH AEPB Inhale 1 puff into the lungs daily.  . furosemide (LASIX) 20 MG tablet Take 20 mg by mouth every other day.  . lamoTRIgine (LAMICTAL) 100 MG tablet Take 1 tablet (100 mg total) by mouth 2 (two) times daily.  Marland Kitchen loratadine (CLARITIN) 10 MG tablet Take 10 mg by mouth daily.  . Melatonin 10 MG TABS Take 10 mg by mouth at bedtime.   . memantine (NAMENDA) 10 MG tablet Take 1 tablet (10 mg total) by mouth 2 (two) times daily.  . Multiple Vitamin (MULTIVITAMIN) capsule Take 1 capsule by mouth daily.  . rivaroxaban (XARELTO) 10 MG TABS tablet Take 1 tablet (10 mg total) by mouth daily.  . simvastatin (ZOCOR) 40 MG tablet Take 40 mg by mouth daily.  . Wheat Dextrin (BENEFIBER DRINK MIX PO) Take 10 mLs by mouth daily. Mix with water   .  [DISCONTINUED] donepezil (ARICEPT) 10 MG tablet TAKE 1 TABLET AT BEDTIME (Patient taking differently: Take 10 mg by mouth at bedtime. )  . [DISCONTINUED] feeding supplement, ENSURE ENLIVE, (ENSURE ENLIVE) LIQD Take 237 mLs by mouth 3 (three) times daily between meals. (Patient taking differently: Take 237 mLs by mouth See admin instructions. 2-3  Times daily)  . [DISCONTINUED] ferrous sulfate 325 (65 FE) MG tablet Take 1 tablet (325 mg total) by mouth every other day. (Patient taking differently: Take 325 mg by mouth every other day. Mon, Wed, Fri)  . [DISCONTINUED] furosemide (LASIX) 20 MG tablet Take 1 tablet (20 mg total) by mouth daily as needed for fluid or edema. (Patient taking differently: Take 20 mg by mouth every other day. Mon, Wed, Fri)  . [DISCONTINUED] rivaroxaban (XARELTO) 10 MG TABS tablet Take 1 tablet (10 mg total) by mouth daily.     Allergies:   Topiramate and Tetanus toxoids   Social History   Tobacco Use  . Smoking status: Former Smoker    Packs/day: 1.00    Years: 16.00    Pack years: 16.00    Start date: 03/30/1954    Quit date: 03/30/1970    Years since quitting: 49.7  . Smokeless tobacco: Never Used  . Tobacco comment: smoked 1/2-1 ppd  Vaping Use  . Vaping Use: Never used  Substance Use Topics  . Alcohol use: Not Currently    Alcohol/week: 0.0 standard drinks  . Drug use: No     Family Hx: The patient's family history includes Asthma in her daughter; Cancer in her maternal grandmother; Emphysema in her father and paternal grandmother; Lung disease in her father; Osteoporosis in her mother; Stroke in her brother.   Labs/Other Tests and Data Reviewed:    EKG:  No ECG reviewed.  Recent Labs: 10/23/2019: ALT 12; BUN 20; Creatinine 0.77; Hemoglobin 10.8; Platelet Count 197; Potassium 3.6; Sodium 143   Recent Lipid Panel No results found for: CHOL, TRIG, HDL, CHOLHDL, LDLCALC, LDLDIRECT  Wt Readings from Last 3 Encounters:  12/22/19 130 lb (59 kg)    09/13/19 130 lb (59 kg)  09/04/19 131 lb 2.8 oz (59.5 kg)     Objective:    Vital Signs:  Ht 5\' 6"  (1.676 m)   Wt 130 lb (59 kg)   BMI 20.98 kg/m  ; 126/70 mmHg Pulse "good"  VITAL SIGNS:  reviewed Pleasant NO acute distress. A&O x 2 (stable).   Mood & Affect Non-labored respirations  ASSESSMENT & PLAN:    Problem List Items Addressed This Visit    Permanent atrial fibrillation [facility]): CHA2DS2-VASc Score 7 - on Xarelto (Chronic)    Rates pretty much well controlled on diltiazem.  Remains on low-dose Xarelto with no bleeding issues.  Remains asymptomatic with no diastolic heart failure episodes.      Relevant Medications   rivaroxaban (XARELTO) 10 MG TABS tablet   furosemide (LASIX) 20 MG tablet   Bilateral lower extremity edema (Chronic)    Continue elevating feet, compression stockings with 4 times a week Lasix.  Seems to be relatively stable.      Hyperlipidemia (Chronic)    Remains on simvastatin.  Would have low threshold to consider changing statin based on her being on diltiazem, but also with memory issues.  I did not choose to make any changes at this point because she is stable, however if lipid panel is stable, would consider switching to lower dose (20 mg) rosuvastatin      Relevant Medications   rivaroxaban (XARELTO) 10 MG TABS tablet   furosemide (LASIX) 20 MG tablet   Essential hypertension (Chronic)    She is no longer on beta-blocker or ACE inhibitor.  Only on diltiazem and pressures seem to be stable based on home readings.  Continue to err on the side of permissive hypertension to  avoid orthostasis.      Relevant Medications   rivaroxaban (XARELTO) 10 MG TABS tablet   furosemide (LASIX) 20 MG tablet   Chronic diastolic CHF (congestive heart failure) (HCC) - Primary (Chronic)    I really think she has diastolic dysfunction and is symptomatic when she goes to A. fib RVR as a result.  Does not really have baseline heart failure symptoms.  Edema does not  seem to be as much related to heart failure as it is just simply venous stasis. Continue current dose of diltiazem.  She is taking her Lasix 4 days a week and has not required any additional dosing. Would be reluctant to push her blood pressure medications too far in order to avoid complicating her already tenuous balance.  Orthostatic symptoms.      Relevant Medications   rivaroxaban (XARELTO) 10 MG TABS tablet   furosemide (LASIX) 20 MG tablet   Gait abnormality (Chronic)    If she continues to have more falls, we may need to reconsider DOAC, for now I think risks are still outweighed by benefits..      Pulmonary hypertension (HCC) (Chronic)    No significant dyspnea.  I suspect that this was probably related to the fact that she was having a pneumonia when the echo was checked. No dyspnea.  On stable dose of diltiazem. Thus the symptoms change, would not recheck echo      Relevant Medications   rivaroxaban (XARELTO) 10 MG TABS tablet   furosemide (LASIX) 20 MG tablet    Other Visit Diagnoses    Malignant neoplasm of rectum (HCC)       Relevant Medications   rivaroxaban (XARELTO) 10 MG TABS tablet      COVID-19 Education: The signs and symptoms of COVID-19 were discussed with the patient and how to seek care for testing (follow up with PCP or arrange E-visit).   The importance of social distancing was discussed today.  Time:   Today, I have spent 19 minutes with the patient with telehealth technology discussing the above problems.  6 min charting.  = 25 min total   Medication Adjustments/Labs and Tests Ordered: Current medicines are reviewed at length with the patient today.  Concerns regarding medicines are outlined above.   Patient Instructions  Medication Instructions:  No change  *If you need a refill on your cardiac medications before your next appointment, please call your pharmacy*   Lab Work: None  If you have labs (blood work) drawn today and your tests  are completely normal, you will receive your results only by: Marland Kitchen MyChart Message (if you have MyChart) OR . A paper copy in the mail If you have any lab test that is abnormal or we need to change your treatment, we will call you to review the results.   Testing/Procedures: None   Follow-Up: At [facility], you and your health needs are our priority.  As part of our continuing mission to provide you with exceptional heart care, we have created designated Provider Care Teams.  These Care Teams include your primary Cardiologist (physician) and Advanced Practice Providers (APPs -  Physician Assistants and Nurse Practitioners) who all work together to provide you with the care you need, when you need it.  We recommend signing up for the patient portal called "MyChart".  Sign up information is provided on this After Visit Summary.  MyChart is used to connect with patients for Virtual Visits (Telemedicine).  Patients are able to view lab/test results,  encounter notes, upcoming appointments, etc.  Non-urgent messages can be sent to your provider as well.   To learn more about what you can do with MyChart, go to [email or web address].    Your next appointment:   1 year(s)  The format for your next appointment:   In Person  Provider:   Glenetta Hew, MD   Other Instructions Stay active, walk around as much you can and make sure you eat plenty of food Your physician wants you to follow-up in: 1 year with Dr. Ellyn Hack.  You will receive a reminder letter in the mail two months in advance. If you don't receive a letter, please call our office to schedule the follow-up appointment.      Signed, Glenetta Hew, MD  12/22/2019 11:30 AM    [place]

## 2019-12-31 DIAGNOSIS — M1711 Unilateral primary osteoarthritis, right knee: Secondary | ICD-10-CM | POA: Diagnosis not present

## 2019-12-31 DIAGNOSIS — M79661 Pain in right lower leg: Secondary | ICD-10-CM | POA: Diagnosis not present

## 2020-01-01 DIAGNOSIS — M79604 Pain in right leg: Secondary | ICD-10-CM | POA: Diagnosis not present

## 2020-01-03 DIAGNOSIS — S32501A Unspecified fracture of right pubis, initial encounter for closed fracture: Secondary | ICD-10-CM | POA: Diagnosis not present

## 2020-01-09 ENCOUNTER — Inpatient Hospital Stay: Admission: RE | Admit: 2020-01-09 | Payer: TRICARE For Life (TFL) | Source: Ambulatory Visit

## 2020-01-19 DIAGNOSIS — S32501A Unspecified fracture of right pubis, initial encounter for closed fracture: Secondary | ICD-10-CM | POA: Diagnosis not present

## 2020-02-05 ENCOUNTER — Ambulatory Visit
Admission: RE | Admit: 2020-02-05 | Discharge: 2020-02-05 | Disposition: A | Discharge status: Home / Self Care | Payer: Medicare Other | Source: Ambulatory Visit | Attending: Surgery | Admitting: Surgery

## 2020-02-05 ENCOUNTER — Other Ambulatory Visit: Payer: Self-pay

## 2020-02-05 DIAGNOSIS — Z90710 Acquired absence of both cervix and uterus: Secondary | ICD-10-CM | POA: Diagnosis not present

## 2020-02-05 DIAGNOSIS — C218 Malignant neoplasm of overlapping sites of rectum, anus and anal canal: Secondary | ICD-10-CM | POA: Diagnosis not present

## 2020-02-05 DIAGNOSIS — K5730 Diverticulosis of large intestine without perforation or abscess without bleeding: Secondary | ICD-10-CM | POA: Diagnosis not present

## 2020-02-05 DIAGNOSIS — C20 Malignant neoplasm of rectum: Secondary | ICD-10-CM

## 2020-02-16 DIAGNOSIS — S32501A Unspecified fracture of right pubis, initial encounter for closed fracture: Secondary | ICD-10-CM | POA: Diagnosis not present

## 2020-02-19 NOTE — Progress Notes (Signed)
I have reviewed and agreed above plan. 

## 2020-02-28 ENCOUNTER — Other Ambulatory Visit: Payer: Self-pay

## 2020-02-28 ENCOUNTER — Emergency Department (HOSPITAL_COMMUNITY): Payer: Medicare Other

## 2020-02-28 ENCOUNTER — Inpatient Hospital Stay: Payer: Medicare Other

## 2020-02-28 ENCOUNTER — Inpatient Hospital Stay: Payer: Medicare Other | Admitting: Hematology

## 2020-02-28 ENCOUNTER — Encounter (HOSPITAL_COMMUNITY): Payer: Self-pay

## 2020-02-28 ENCOUNTER — Inpatient Hospital Stay (HOSPITAL_COMMUNITY)
Admission: EM | Admit: 2020-02-28 | Discharge: 2020-03-04 | DRG: 640 | Disposition: A | Discharge status: Skilled Nursing Facility | Payer: Medicare Other | Attending: Student | Admitting: Student

## 2020-02-28 ENCOUNTER — Observation Stay (HOSPITAL_COMMUNITY): Payer: Medicare Other

## 2020-02-28 DIAGNOSIS — Z6820 Body mass index (BMI) 20.0-20.9, adult: Secondary | ICD-10-CM

## 2020-02-28 DIAGNOSIS — G9341 Metabolic encephalopathy: Secondary | ICD-10-CM | POA: Diagnosis not present

## 2020-02-28 DIAGNOSIS — R32 Unspecified urinary incontinence: Secondary | ICD-10-CM | POA: Diagnosis present

## 2020-02-28 DIAGNOSIS — Z923 Personal history of irradiation: Secondary | ICD-10-CM

## 2020-02-28 DIAGNOSIS — Z20822 Contact with and (suspected) exposure to covid-19: Secondary | ICD-10-CM | POA: Diagnosis not present

## 2020-02-28 DIAGNOSIS — M25559 Pain in unspecified hip: Secondary | ICD-10-CM

## 2020-02-28 DIAGNOSIS — Z66 Do not resuscitate: Secondary | ICD-10-CM | POA: Diagnosis not present

## 2020-02-28 DIAGNOSIS — S3282XA Multiple fractures of pelvis without disruption of pelvic ring, initial encounter for closed fracture: Secondary | ICD-10-CM | POA: Diagnosis not present

## 2020-02-28 DIAGNOSIS — N3000 Acute cystitis without hematuria: Secondary | ICD-10-CM | POA: Diagnosis not present

## 2020-02-28 DIAGNOSIS — I5032 Chronic diastolic (congestive) heart failure: Secondary | ICD-10-CM | POA: Diagnosis present

## 2020-02-28 DIAGNOSIS — I4821 Permanent atrial fibrillation: Secondary | ICD-10-CM | POA: Diagnosis not present

## 2020-02-28 DIAGNOSIS — Z85048 Personal history of other malignant neoplasm of rectum, rectosigmoid junction, and anus: Secondary | ICD-10-CM

## 2020-02-28 DIAGNOSIS — E785 Hyperlipidemia, unspecified: Secondary | ICD-10-CM | POA: Diagnosis present

## 2020-02-28 DIAGNOSIS — M25552 Pain in left hip: Secondary | ICD-10-CM | POA: Diagnosis not present

## 2020-02-28 DIAGNOSIS — E440 Moderate protein-calorie malnutrition: Secondary | ICD-10-CM | POA: Diagnosis present

## 2020-02-28 DIAGNOSIS — J449 Chronic obstructive pulmonary disease, unspecified: Secondary | ICD-10-CM | POA: Diagnosis present

## 2020-02-28 DIAGNOSIS — F39 Unspecified mood [affective] disorder: Secondary | ICD-10-CM | POA: Diagnosis present

## 2020-02-28 DIAGNOSIS — E860 Dehydration: Principal | ICD-10-CM | POA: Diagnosis present

## 2020-02-28 DIAGNOSIS — I10 Essential (primary) hypertension: Secondary | ICD-10-CM | POA: Diagnosis not present

## 2020-02-28 DIAGNOSIS — I2510 Atherosclerotic heart disease of native coronary artery without angina pectoris: Secondary | ICD-10-CM | POA: Diagnosis present

## 2020-02-28 DIAGNOSIS — I2584 Coronary atherosclerosis due to calcified coronary lesion: Secondary | ICD-10-CM | POA: Diagnosis present

## 2020-02-28 DIAGNOSIS — F0390 Unspecified dementia without behavioral disturbance: Secondary | ICD-10-CM | POA: Diagnosis present

## 2020-02-28 DIAGNOSIS — Z8673 Personal history of transient ischemic attack (TIA), and cerebral infarction without residual deficits: Secondary | ICD-10-CM

## 2020-02-28 DIAGNOSIS — I110 Hypertensive heart disease with heart failure: Secondary | ICD-10-CM | POA: Diagnosis present

## 2020-02-28 DIAGNOSIS — Z23 Encounter for immunization: Secondary | ICD-10-CM

## 2020-02-28 DIAGNOSIS — I272 Pulmonary hypertension, unspecified: Secondary | ICD-10-CM | POA: Diagnosis present

## 2020-02-28 DIAGNOSIS — Z7901 Long term (current) use of anticoagulants: Secondary | ICD-10-CM | POA: Diagnosis not present

## 2020-02-28 DIAGNOSIS — M549 Dorsalgia, unspecified: Secondary | ICD-10-CM | POA: Diagnosis not present

## 2020-02-28 DIAGNOSIS — M25551 Pain in right hip: Secondary | ICD-10-CM | POA: Diagnosis not present

## 2020-02-28 DIAGNOSIS — I4891 Unspecified atrial fibrillation: Secondary | ICD-10-CM | POA: Diagnosis not present

## 2020-02-28 DIAGNOSIS — N390 Urinary tract infection, site not specified: Secondary | ICD-10-CM | POA: Diagnosis present

## 2020-02-28 DIAGNOSIS — Z90710 Acquired absence of both cervix and uterus: Secondary | ICD-10-CM

## 2020-02-28 DIAGNOSIS — I517 Cardiomegaly: Secondary | ICD-10-CM | POA: Diagnosis not present

## 2020-02-28 DIAGNOSIS — R531 Weakness: Secondary | ICD-10-CM | POA: Diagnosis not present

## 2020-02-28 DIAGNOSIS — S329XXA Fracture of unspecified parts of lumbosacral spine and pelvis, initial encounter for closed fracture: Secondary | ICD-10-CM | POA: Diagnosis not present

## 2020-02-28 DIAGNOSIS — Z79899 Other long term (current) drug therapy: Secondary | ICD-10-CM

## 2020-02-28 DIAGNOSIS — Z87891 Personal history of nicotine dependence: Secondary | ICD-10-CM

## 2020-02-28 DIAGNOSIS — R52 Pain, unspecified: Secondary | ICD-10-CM | POA: Diagnosis not present

## 2020-02-28 DIAGNOSIS — Z9221 Personal history of antineoplastic chemotherapy: Secondary | ICD-10-CM

## 2020-02-28 DIAGNOSIS — D509 Iron deficiency anemia, unspecified: Secondary | ICD-10-CM | POA: Diagnosis present

## 2020-02-28 LAB — COMPREHENSIVE METABOLIC PANEL
ALT: 14 U/L (ref 0–44)
AST: 27 U/L (ref 15–41)
Albumin: 3.5 g/dL (ref 3.5–5.0)
Alkaline Phosphatase: 77 U/L (ref 38–126)
Anion gap: 13 (ref 5–15)
BUN: 21 mg/dL (ref 8–23)
CO2: 24 mmol/L (ref 22–32)
Calcium: 9.4 mg/dL (ref 8.9–10.3)
Chloride: 105 mmol/L (ref 98–111)
Creatinine, Ser: 0.68 mg/dL (ref 0.44–1.00)
GFR, Estimated: >60 mL/min (ref >60)
Glucose, Bld: 79 mg/dL (ref 70–99)
Potassium: 3.9 mmol/L (ref 3.5–5.1)
Sodium: 142 mmol/L (ref 135–145)
Total Bilirubin: 0.9 mg/dL (ref 0.3–1.2)
Total Protein: 6.4 g/dL — ABNORMAL LOW (ref 6.5–8.1)

## 2020-02-28 LAB — CK: Total CK: 46 U/L (ref 38–234)

## 2020-02-28 LAB — RESP PANEL BY RT-PCR (FLU A&B, COVID) ARPGX2
Influenza A by PCR: NEGATIVE
Influenza B by PCR: NEGATIVE
SARS Coronavirus 2 by RT PCR: NEGATIVE

## 2020-02-28 LAB — URINALYSIS, ROUTINE W REFLEX MICROSCOPIC
Bilirubin Urine: NEGATIVE
Glucose, UA: NEGATIVE mg/dL
Hgb urine dipstick: NEGATIVE
Ketones, ur: NEGATIVE mg/dL
Leukocytes,Ua: NEGATIVE
Nitrite: POSITIVE — AB
Protein, ur: NEGATIVE mg/dL
Specific Gravity, Urine: 1.012 (ref 1.005–1.030)
pH: 8 (ref 5.0–8.0)

## 2020-02-28 LAB — CBC WITH DIFFERENTIAL/PLATELET
Abs Immature Granulocytes: 0.04 10*3/uL (ref 0.00–0.07)
Basophils Absolute: 0 10*3/uL (ref 0.0–0.1)
Basophils Relative: 0 %
Eosinophils Absolute: 0 10*3/uL (ref 0.0–0.5)
Eosinophils Relative: 0 %
HCT: 35.9 % — ABNORMAL LOW (ref 36.0–46.0)
Hemoglobin: 10.9 g/dL — ABNORMAL LOW (ref 12.0–15.0)
Immature Granulocytes: 1 %
Lymphocytes Relative: 10 %
Lymphs Abs: 0.6 10*3/uL — ABNORMAL LOW (ref 0.7–4.0)
MCH: 30.5 pg (ref 26.0–34.0)
MCHC: 30.4 g/dL (ref 30.0–36.0)
MCV: 100.6 fL — ABNORMAL HIGH (ref 80.0–100.0)
Monocytes Absolute: 0.5 10*3/uL (ref 0.1–1.0)
Monocytes Relative: 8 %
Neutro Abs: 5.2 10*3/uL (ref 1.7–7.7)
Neutrophils Relative %: 81 %
Platelets: 234 10*3/uL (ref 150–400)
RBC: 3.57 MIL/uL — ABNORMAL LOW (ref 3.87–5.11)
RDW: 13.7 % (ref 11.5–15.5)
WBC: 6.4 10*3/uL (ref 4.0–10.5)
nRBC: 0 % (ref 0.0–0.2)

## 2020-02-28 LAB — TROPONIN I (HIGH SENSITIVITY)
Troponin I (High Sensitivity): 7 ng/L (ref <18)
Troponin I (High Sensitivity): 7 ng/L (ref <18)

## 2020-02-28 LAB — SEDIMENTATION RATE: Sed Rate: 23 mm/hr — ABNORMAL HIGH (ref 0–22)

## 2020-02-28 MED ORDER — FLUTICASONE FUROATE-VILANTEROL 200-25 MCG/INH IN AEPB
1.0000 | INHALATION_SPRAY | Freq: Every day | RESPIRATORY_TRACT | Status: DC
  Administered 2020-03-01 – 2020-03-03 (×3): 1 via RESPIRATORY_TRACT
  Filled 2020-02-28: qty 28

## 2020-02-28 MED ORDER — MORPHINE SULFATE (PF) 4 MG/ML IV SOLN
4.0000 mg | Freq: Once | INTRAVENOUS | Status: AC
  Administered 2020-02-28: 4 mg via INTRAVENOUS
  Filled 2020-02-28: qty 1

## 2020-02-28 MED ORDER — SODIUM CHLORIDE 0.9 % IV BOLUS
500.0000 mL | Freq: Once | INTRAVENOUS | Status: AC
  Administered 2020-02-28: 500 mL via INTRAVENOUS

## 2020-02-28 MED ORDER — SODIUM CHLORIDE 0.9% FLUSH
3.0000 mL | Freq: Two times a day (BID) | INTRAVENOUS | Status: DC
  Administered 2020-02-28 – 2020-03-04 (×10): 3 mL via INTRAVENOUS

## 2020-02-28 MED ORDER — LAMOTRIGINE 100 MG PO TABS
100.0000 mg | ORAL_TABLET | Freq: Two times a day (BID) | ORAL | Status: DC
  Administered 2020-02-28 – 2020-03-04 (×10): 100 mg via ORAL
  Filled 2020-02-28 (×11): qty 1

## 2020-02-28 MED ORDER — DILTIAZEM HCL ER COATED BEADS 240 MG PO CP24
240.0000 mg | ORAL_CAPSULE | Freq: Every day | ORAL | Status: DC
  Administered 2020-02-28 – 2020-03-04 (×6): 240 mg via ORAL
  Filled 2020-02-28 (×2): qty 1
  Filled 2020-02-28: qty 2
  Filled 2020-02-28 (×3): qty 1

## 2020-02-28 MED ORDER — CALCITONIN (SALMON) 200 UNIT/ACT NA SOLN
1.0000 | Freq: Every day | NASAL | Status: DC
  Administered 2020-02-29 – 2020-03-04 (×5): 1 via NASAL
  Filled 2020-02-28: qty 3.7

## 2020-02-28 MED ORDER — MELATONIN 5 MG PO TABS
10.0000 mg | ORAL_TABLET | Freq: Every day | ORAL | Status: DC
  Administered 2020-02-28 – 2020-03-03 (×5): 10 mg via ORAL
  Filled 2020-02-28 (×7): qty 2

## 2020-02-28 MED ORDER — ENSURE ENLIVE PO LIQD
237.0000 mL | Freq: Three times a day (TID) | ORAL | Status: DC
  Administered 2020-02-29 – 2020-03-04 (×13): 237 mL via ORAL
  Filled 2020-02-28: qty 237

## 2020-02-28 MED ORDER — UMECLIDINIUM BROMIDE 62.5 MCG/INH IN AEPB
1.0000 | INHALATION_SPRAY | Freq: Every day | RESPIRATORY_TRACT | Status: DC
  Administered 2020-02-29 – 2020-03-03 (×4): 1 via RESPIRATORY_TRACT
  Filled 2020-02-28: qty 7

## 2020-02-28 MED ORDER — ESCITALOPRAM OXALATE 20 MG PO TABS
20.0000 mg | ORAL_TABLET | Freq: Every day | ORAL | Status: DC
  Administered 2020-02-28 – 2020-03-04 (×6): 20 mg via ORAL
  Filled 2020-02-28 (×5): qty 1
  Filled 2020-02-28: qty 2

## 2020-02-28 MED ORDER — MEMANTINE HCL 10 MG PO TABS
10.0000 mg | ORAL_TABLET | Freq: Two times a day (BID) | ORAL | Status: DC
  Administered 2020-02-28 – 2020-03-04 (×10): 10 mg via ORAL
  Filled 2020-02-28 (×11): qty 1

## 2020-02-28 MED ORDER — ACETAMINOPHEN 325 MG PO TABS
650.0000 mg | ORAL_TABLET | Freq: Four times a day (QID) | ORAL | Status: DC | PRN
  Administered 2020-02-29: 650 mg via ORAL
  Filled 2020-02-28: qty 2

## 2020-02-28 MED ORDER — FUROSEMIDE 20 MG PO TABS
20.0000 mg | ORAL_TABLET | ORAL | Status: DC
  Administered 2020-02-28 – 2020-03-03 (×3): 20 mg via ORAL
  Filled 2020-02-28 (×4): qty 1

## 2020-02-28 MED ORDER — RIVAROXABAN 10 MG PO TABS
10.0000 mg | ORAL_TABLET | Freq: Every day | ORAL | Status: DC
  Administered 2020-02-28 – 2020-03-04 (×6): 10 mg via ORAL
  Filled 2020-02-28 (×7): qty 1

## 2020-02-28 MED ORDER — SODIUM CHLORIDE 0.9 % IV SOLN
1.0000 g | INTRAVENOUS | Status: DC
  Administered 2020-02-28 – 2020-02-29 (×2): 1 g via INTRAVENOUS
  Filled 2020-02-28 (×3): qty 10

## 2020-02-28 MED ORDER — TRAMADOL HCL 50 MG PO TABS
50.0000 mg | ORAL_TABLET | Freq: Four times a day (QID) | ORAL | Status: DC | PRN
  Administered 2020-02-29 – 2020-03-02 (×4): 50 mg via ORAL
  Filled 2020-02-28 (×4): qty 1

## 2020-02-28 MED ORDER — ALBUTEROL SULFATE (2.5 MG/3ML) 0.083% IN NEBU: 2.5000 mg | INHALATION_SOLUTION | Freq: Four times a day (QID) | RESPIRATORY_TRACT | Status: DC | PRN

## 2020-02-28 MED ORDER — ACETAMINOPHEN 650 MG RE SUPP: 650.0000 mg | Freq: Four times a day (QID) | RECTAL | Status: DC | PRN

## 2020-02-28 MED ORDER — LORATADINE 10 MG PO TABS
10.0000 mg | ORAL_TABLET | Freq: Every day | ORAL | Status: DC
  Administered 2020-02-28 – 2020-03-04 (×6): 10 mg via ORAL
  Filled 2020-02-28 (×6): qty 1

## 2020-02-28 MED ORDER — SIMVASTATIN 20 MG PO TABS
40.0000 mg | ORAL_TABLET | Freq: Every day | ORAL | Status: DC
  Administered 2020-02-28 – 2020-02-29 (×2): 40 mg via ORAL
  Filled 2020-02-28 (×2): qty 2

## 2020-02-28 MED ORDER — DONEPEZIL HCL 10 MG PO TABS
10.0000 mg | ORAL_TABLET | Freq: Every day | ORAL | Status: DC
  Administered 2020-02-28 – 2020-03-03 (×5): 10 mg via ORAL
  Filled 2020-02-28 (×6): qty 1

## 2020-02-28 NOTE — H&P (Addendum)
History and Physical    Megan Munoz GYI:[id number] DOB: Nov 19, 1932 DOA: 02/28/2020  Referring MD/NP/PA: Veryl Speak, MD PCP: Fanny Bien, MD  Patient coming from: home  Chief Complaint: Cramps  I have personally briefly reviewed patient's old medical records in [place]   HPI: Megan Munoz is a 86 y.o. female with medical history significant of permanent atrial fibrillation on anticoagulation, hypertension, hyperlipidemia, CVA,  lung nodules, colorectal cancer s/p chemoradiation, and dementia presents with having cramps all over the last few days. The patient has been having cramps in her hip, knees, sternum, feet, and back. Normally at baseline patient is able to complete most ADLs including bathing, dressing, and even cooks apart from meals. She lives at with her daughter's family and normally ambulates with the use of the walker. Over the last few days patient reports that she has been needing to wear depends due to accidentally urinating on herself. She reports feeling measurable and was unable to get up and move due to the discomfort she felt. Patient reports that she has fallen 3 times over the last 6 months with the last fall possibly being in the last week. Patient denies having any nausea, vomiting, or diarrhea symptoms.  The patient's daughter helps provide additional history over the phone. The patient had been more weak over the last few days and complaining of pain radiating down her back. Was also noted to have a low-grade fever up to 100.1 F yesterday for which she was given Tylenol. They had had a televisit the other day due to her symptoms she was diagnosed with a urinary tract infection prescribed Bactrim. Last night the patient took 1 dose of Bactrim. However, this morning he was unable to get up and move like usual and therefore they brought her to the hospital for further evaluation. Daughter also notes that about 5 weeks ago the patient hada pelvis fracture. She had  repeat x-rays done last week due to her ability to get around and reportedly showed new fractures. She also reports that the patient has been short of breath when getting up and moving around.  ED Course: Upon admission into the emergency department patient was seen to be afebrile, pulse 88-119 in atrial fibrillation, respirations 16-31, and all other vital signs relatively maintained.  Labs relatively unchanged from baseline.  Chest x-ray showed mild cardiomegaly unchanged from previous. Urinalysis was positive for nitrates. Patient was given 500 mL of IV fluid x1 dose.  TRH called to admit.  Review of Systems  Unable to perform ROS: Dementia  Constitutional: Positive for fever and malaise/fatigue.  HENT: Negative for ear discharge and nosebleeds.   Eyes: Negative for double vision and photophobia.  Respiratory: Negative for cough and shortness of breath.   Cardiovascular: Negative for chest pain and leg swelling.  Gastrointestinal: Negative for nausea and vomiting.  Genitourinary: Negative for dysuria.       Positive for urinary incontinence  Musculoskeletal: Positive for falls, joint pain and myalgias.  Skin: Negative for itching and rash.  Neurological: Positive for weakness. Negative for loss of consciousness.  Endo/Heme/Allergies: Negative for polydipsia.  Psychiatric/Behavioral: Negative for memory loss and substance abuse.    Past Medical History:  Diagnosis Date  . Anxiety   . Atherosclerosis of aortic arch [facility]) April 2017   Noted on CT chest, calcified aortic atherosclerosis at the arch.  . Atrial fibrillation, permanent ([place]) 03/2013   Previously on Atenolol for Rate - now on combination diltiazem and carvedilol for rate Control;  &  Xarelto for [facility]. CHADS2VASC2 = 7.  . Cerebral infarction [facility]) 10/2008   MRI Brain 05/2014 for near syncope revealed: No acure infarct.  Moderate to large remote left frontal lobe infarct with encephalomalacia and dilation of the frontal horn of the  left lateral ventricle.   Mild small vessel disease type changes.  No intracranial hemorrhage.  Global atrophy.   Marland Kitchen COPD (chronic obstructive pulmonary disease) ([place])   . Coronary atherosclerosis due to calcified coronary lesion April 2017   Noted on CT chest  . Dementia ([place])    following 2010 stroke  . Diastolic dysfunction with chronic heart failure (HCC)    Exacerbated by A. fib RVR  . Hyperlipemia   . Hypertension   . Pneumonia   . PONV (postoperative nausea and vomiting)   . Stroke [facility])    2010- patient has dementia related to stroke   . Tinea unguium   . Tremor   . Tuberculosis     Past Surgical History:  Procedure Laterality Date  . BIOPSY  06/01/2018   Procedure: BIOPSY;  Surgeon: Ronnette Juniper, MD;  Location: [facility];  Service: Gastroenterology;;  . BLADDER SURGERY    . CARPAL TUNNEL RELEASE Left   . COLONOSCOPY N/A 06/01/2018   Procedure: COLONOSCOPY;  Surgeon: Ronnette Juniper, MD;  Location: [facility];  Service: Gastroenterology;  Laterality: N/A;  . ESOPHAGOGASTRODUODENOSCOPY (EGD) WITH PROPOFOL N/A 02/04/2018   Procedure: ESOPHAGOGASTRODUODENOSCOPY (EGD) WITH PROPOFOL;  Surgeon: Ronnette Juniper, MD;  Location: [facility];  Service: Gastroenterology;  Laterality: N/A;  . FOOT SURGERY Right   . HERNIA REPAIR    . NASAL SINUS SURGERY    . POLYPECTOMY  06/01/2018   Procedure: POLYPECTOMY;  Surgeon: Ronnette Juniper, MD;  Location: [facility];  Service: Gastroenterology;;  . TOTAL ABDOMINAL HYSTERECTOMY       reports that she quit smoking about 49 years ago. She started smoking about 65 years ago. She has a 16.00 pack-year smoking history. She has never used smokeless tobacco. She reports previous alcohol use. She reports that she does not use drugs.  Allergies  Allergen Reactions  . Topiramate Other (See Comments)    Drastic cognitive change, diarrhea, weight loss  . Tetanus Toxoids Other (See Comments)    Told never to take     Family History  Problem Relation Age of  Onset  . Osteoporosis Mother   . Emphysema Father   . Lung disease Father        Black Lung  . Stroke Brother   . Cancer Maternal Grandmother   . Emphysema Paternal Grandmother   . Asthma Daughter        allergy induced    Prior to Admission medications   Medication Sig Start Date End Date Taking? Authorizing Provider  acetaminophen (TYLENOL) 500 MG tablet Take 1,000 mg by mouth every 6 (six) hours as needed for mild pain, fever or headache.   Yes [provider]  Azelastine HCl 137 MCG/SPRAY SOLN Place 2 sprays into both nostrils 2 (two) times daily. Use in each nostril as directed    Yes [provider]  B Complex Vitamins (VITAMIN B COMPLEX PO) Take 1 tablet by mouth daily.    Yes [provider]  calcitonin, salmon, (MIACALCIN) 200 UNIT/ACT nasal spray Place 1 spray into alternate nostrils daily.   Yes [provider]  Cholecalciferol (VITAMIN D3 SUPER STRENGTH) 2000 units CAPS Take 2,000 Units by mouth daily.    Yes [provider]  diltiazem (CARDIZEM CD)  120 MG 24 hr capsule TAKE 240 MG (2 TABLETS) DAILY. IF DIZZY OR BLOOD PRESSURE IS LOW MAY TAKE 120 MG ONLY THAT DAY Patient taking differently: Take 240 mg by mouth daily. TAKE 240 MG (2 TABLETS) DAILY. IF DIZZY OR BLOOD PRESSURE IS LOW MAY TAKE 120 MG ONLY THAT DAY 02/28/19  Yes Leonie Man, MD  donepezil (ARICEPT) 10 MG tablet Take 10 mg by mouth at bedtime.    [provider]  Ensure (ENSURE) Take 237 mLs by mouth 3 (three) times daily between meals.    [provider]  escitalopram (LEXAPRO) 20 MG tablet Take 20 mg by mouth daily.    [provider]  ferrous sulfate 325 (65 FE) MG EC tablet Take 325 mg by mouth every other day.    [provider]  Fluticasone-Umeclidin-Vilant (TRELEGY ELLIPTA) 100-62.5-25 MCG/INH AEPB Inhale 1 puff into the lungs daily.    [provider]  furosemide (LASIX) 20 MG tablet Take 20 mg by mouth every other  day.    [provider]  lamoTRIgine (LAMICTAL) 100 MG tablet Take 1 tablet (100 mg total) by mouth 2 (two) times daily. 10/31/19   Marcial Pacas, MD  loratadine (CLARITIN) 10 MG tablet Take 10 mg by mouth daily.    [provider]  Melatonin 10 MG TABS Take 10 mg by mouth at bedtime.     [provider]  memantine (NAMENDA) 10 MG tablet Take 1 tablet (10 mg total) by mouth 2 (two) times daily. 09/21/19   Suzzanne Cloud, NP  Multiple Vitamin (MULTIVITAMIN) capsule Take 1 capsule by mouth daily.    [provider]  rivaroxaban (XARELTO) 10 MG TABS tablet Take 1 tablet (10 mg total) by mouth daily. 12/22/19   Leonie Man, MD  simvastatin (ZOCOR) 40 MG tablet Take 40 mg by mouth daily.    [provider]  Wheat Dextrin (BENEFIBER DRINK MIX PO) Take 10 mLs by mouth daily. Mix with water     [provider]    Physical Exam:  Constitutional: Elderly female currently in no acute distress Vitals:   02/28/20 1515 02/28/20 1530 02/28/20 1545 02/28/20 1600  BP:  (!) 145/76  (!) 149/136  Pulse: 88 96 (!) 102 (!) 119  Resp: (!) 30 16 (!) 26 (!) 29  Temp:      TempSrc:      SpO2: 96% 96% 91% 93%  Weight:      Height:       Eyes: PERRL, lids and conjunctivae normal ENMT: Mucous membranes are moist. Posterior pharynx clear of any exudate or lesions.   Neck: normal, supple, no masses, no thyromegaly Respiratory: clear to auscultation bilaterally, no wheezing, no crackles. Normal respiratory effort. No accessory muscle use. Talking complete sentences on room air. Cardiovascular: Irregular irregular no murmurs / rubs / gallops. No extremity edema. 2+ pedal pulses. No carotid bruits.  Abdomen: no tenderness, no masses palpated. No hepatosplenomegaly. Bowel sounds positive.  Musculoskeletal: no clubbing / cyanosis. Decreased mobility due to joint pain.  Skin: no rashes, lesions, ulcers. No induration Neurologic: CN 2-12 grossly intact. Sensation  intact, DTR normal. Strength 4/5 in all 4. Tremor noted at base Psychiatric: Normal recent memory. Alert and oriented x 3. Normal mood.     Labs on Admission: I have personally reviewed following labs and imaging studies  CBC: Recent Labs  Lab 02/28/20 1019  WBC 6.4  NEUTROABS 5.2  HGB 10.9*  HCT 35.9*  MCV 100.6*  PLT 381   Basic Metabolic Panel: Recent Labs  Lab 02/28/20 1019  NA 142  K 3.9  CL 105  CO2 24  GLUCOSE 79  BUN 21  CREATININE 0.68  CALCIUM 9.4   GFR: Estimated Creatinine Clearance: 47.9 mL/min (by C-G formula based on SCr of 0.68 mg/dL). Liver Function Tests: Recent Labs  Lab 02/28/20 1019  AST 27  ALT 14  ALKPHOS 77  BILITOT 0.9  PROT 6.4*  ALBUMIN 3.5   No results for input(s): LIPASE, AMYLASE in the last 168 hours. No results for input(s): AMMONIA in the last 168 hours. Coagulation Profile: No results for input(s): INR, PROTIME in the last 168 hours. Cardiac Enzymes: Recent Labs  Lab 02/28/20 1019  CKTOTAL 46   BNP (last 3 results) No results for input(s): PROBNP in the last 8760 hours. HbA1C: No results for input(s): HGBA1C in the last 72 hours. CBG: No results for input(s): GLUCAP in the last 168 hours. Lipid Profile: No results for input(s): CHOL, HDL, LDLCALC, TRIG, CHOLHDL, LDLDIRECT in the last 72 hours. Thyroid Function Tests: No results for input(s): TSH, T4TOTAL, FREET4, T3FREE, THYROIDAB in the last 72 hours. Anemia Panel: No results for input(s): VITAMINB12, FOLATE, FERRITIN, TIBC, IRON, RETICCTPCT in the last 72 hours. Urine analysis:    Component Value Date/Time   COLORURINE AMBER (A) 02/28/2020 1019   APPEARANCEUR CLOUDY (A) 02/28/2020 1019   LABSPEC 1.012 02/28/2020 1019   PHURINE 8.0 02/28/2020 1019   GLUCOSEU NEGATIVE 02/28/2020 1019   HGBUR NEGATIVE 02/28/2020 1019   BILIRUBINUR NEGATIVE 02/28/2020 1019   KETONESUR NEGATIVE 02/28/2020 1019   PROTEINUR NEGATIVE 02/28/2020 1019   UROBILINOGEN 0.2 07/07/2014  1451   NITRITE POSITIVE (A) 02/28/2020 1019   LEUKOCYTESUR NEGATIVE 02/28/2020 1019   Sepsis Labs: Recent Results (from the past 240 hour(s))  Resp Panel by RT-PCR (Flu A&B, Covid) Nasopharyngeal Swab     Status: None   Collection Time: 02/28/20 10:19 AM   Specimen: Nasopharyngeal Swab; Nasopharyngeal(NP) swabs in vial transport medium  Result Value Ref Range Status   SARS Coronavirus 2 by RT PCR NEGATIVE NEGATIVE Final    Comment: (NOTE) SARS-CoV-2 target nucleic acids are NOT DETECTED.  The SARS-CoV-2 RNA is generally detectable in upper respiratory specimens during the acute phase of infection. The lowest concentration of SARS-CoV-2 viral copies this assay can detect is 138 copies/mL. A negative result does not preclude SARS-Cov-2 infection and should not be used as the sole basis for treatment or other patient management decisions. A negative result may occur with  improper specimen collection/handling, submission of specimen other than nasopharyngeal swab, presence of viral mutation(s) within the areas targeted by this assay, and inadequate number of viral copies(<138 copies/mL). A negative result must be combined with clinical observations, patient history, and epidemiological information. The expected result is Negative.  Fact Sheet for Patients:  EntrepreneurPulse.com.au  Fact Sheet for Healthcare Providers:  IncredibleEmployment.be  This test is no t yet approved or cleared by the [place] FDA and  has been authorized for detection and/or diagnosis of SARS-CoV-2 by FDA under an Emergency Use Authorization (EUA). This EUA will remain  in effect (meaning this test can be used) for the duration of the COVID-19 declaration under Section 564(b)(1) of the Act, 21 U.S.C.section 360bbb-3(b)(1), unless the authorization is terminated  or revoked sooner.       Influenza A by PCR NEGATIVE NEGATIVE Final   Influenza B by PCR NEGATIVE  NEGATIVE Final    Comment: (NOTE) The  Xpert Xpress SARS-CoV-2/FLU/RSV plus assay is intended as an aid in the diagnosis of influenza from Nasopharyngeal swab specimens and should not be used as a sole basis for treatment. Nasal washings and aspirates are unacceptable for Xpert Xpress SARS-CoV-2/FLU/RSV testing.  Fact Sheet for Patients: EntrepreneurPulse.com.au  Fact Sheet for Healthcare Providers: IncredibleEmployment.be  This test is not yet approved or cleared by the [place] FDA and has been authorized for detection and/or diagnosis of SARS-CoV-2 by FDA under an Emergency Use Authorization (EUA). This EUA will remain in effect (meaning this test can be used) for the duration of the COVID-19 declaration under Section 564(b)(1) of the Act, 21 U.S.C. section 360bbb-3(b)(1), unless the authorization is terminated or revoked.  Performed at [facility], [place] [address] [place], [place] [address]      Radiological Exams on Admission: DG Chest Port 1 View  Result Date: 02/28/2020 CLINICAL DATA:  Weakness. Additional history provided: Patient reports weakness, back pain, hip pain, leg pain. EXAM: PORTABLE CHEST 1 VIEW COMPARISON:  Prior chest radiograph 09/13/2019. FINDINGS: Mild cardiomegaly, unchanged. Aortic atherosclerosis. No appreciable airspace consolidation or pulmonary edema. No evidence of pleural effusion or pneumothorax. No acute bony abnormality identified. IMPRESSION: No evidence of acute cardiopulmonary abnormality. Mild cardiomegaly, unchanged. Aortic Atherosclerosis (ICD10-I70.0). Electronically Signed   By: Kellie Simmering DO   On: 02/28/2020 10:22    EKG: Independently reviewed. Atrial fibrillation at 95 bpm  Assessment/Plan Weakness secondary to urinary tract: Acute. Patient presents with complaints of weakness. Noted to have a urinary incontinence which is unusual for her. Patient had been prescribed Bactrim and took 1  dose last night and urinalysis positive for nitrites.  -Admit to medical telemetry bed -Check urine culture, but may not show clear organism given patient had already been received at least 1 dose of factor -Rocephin IV  Pelvic fracture history of fall: Patient reportedly had pelvic fracture that was initially diagnosed about 5 weeks ago. She is currently weightbearing as tolerated, but reported possibly having another fall this week. Daughter does not know about a fall in the last week, but patient has dementia and sometimes has trouble with dates. -Tramadol as needed for pain -Check pelvic x-ray -PT/OT consulted for weakness   Persistent atrial fibrillation on chronic anticoagulation: On admission patient's heart rates ranged from 88-119. Home medication regimen includes Cardizem 240 mg daily and Xarelto 10 mg daily -Continue Cardizem and Xarelto  Dementia: Patient with a past history of dementia as well as stroke. At baseline patient is able to function relatively well and complete most of her ADLs without much assistance. Home medications include Aricept 10 mg daily and Namenda 10 mg twice daily -Continue home regimen  Dyspnea: Acute on chronic. Patient's family notes that she has been more short of breath lately. At baseline patient is on Trelegy Ellipta for issues with breathing. Has prior history of pulmonary nodules, recurrent pneumonia, pulmonary hypertension and COPD. On physical exam no significant wheezes or rhonchi appreciated. Chest x-ray did not show any significant signs of fluid or infiltrate. -Continue pharmacy substitution for Trelegy Ellipta -Albuterol inhaler  Diastolic CHF: Chronic. Patient appears to be euvolemic at this time. Last EF noted to be 50-55% with diffuse hypokinesis back in 01/2018.  -Strict intake and output -Daily weights  Normocytic anemia: Hemoglobin 10.9 on admission which appears near patient's baseline. Patient denies any reports of  bleeding. -Continue to monitor  Hyperlipidemia: Home medications include simvastatin 40 mg daily -Continue statin  History of colorectal cancer: Patient  status post chemotherapy and radiation followed by Dr. Burr Medico of oncology in the outpatient setting. -Continue outpatient follow-up  DNR: Present on admission  DVT prophylaxis: Xarelto Code Status: DNR Family Communication: Daughter updated over the phone Disposition Plan: To be determined Consults called: None Admission status: Observation  Norval Morton MD [facility] Pager [phone]   If 7PM-7AM, please contact night-coverage [email or web address] Password [facility]  02/28/2020, 4:25 PM

## 2020-02-28 NOTE — ED Notes (Signed)
Pt called out requesting pain medication, EDP made aware, awaiting orders

## 2020-02-28 NOTE — ED Triage Notes (Signed)
Pt from home with ems with multiple complaints: Pain to her hips, lower back and bilateral legs. Pt also c.o generally not feeling well, some chest pain. Per family pt is able to walk with a walker at home up until today. Pt recently treated outpatient for a UTI and a ?pelvic fracture 5 weeks ago. Pt denies any falls. Pt a.o, VSS

## 2020-02-28 NOTE — ED Notes (Signed)
This RN called lab again due to labs not in process, spoke with [place], states he does not see that they were received and will call this RN back.

## 2020-02-28 NOTE — ED Notes (Signed)
This RN called about labs not in process. Lab found labs, processing at this time

## 2020-02-28 NOTE — ED Notes (Signed)
X-ray at bedside

## 2020-02-28 NOTE — ED Notes (Signed)
RN called lab again, spoke with Maudie Mercury, states she just now found her labs, processing them now

## 2020-02-28 NOTE — ED Provider Notes (Addendum)
[place] EMERGENCY DEPARTMENT Provider Note   CSN: [id number] Arrival date & time: 02/28/20  3149     History Chief Complaint  Patient presents with  . Weakness  . Back Pain  . Hip Pain  . Leg Pain    Megan Munoz is a 86 y.o. female.  Patient is an 86 year old female with past medical history of atrial fibrillation, COPD, hypertension, dementia.  Patient recently diagnosed with pelvic fracture being treated with weightbearing as tolerated as well as recent UTI currently on antibiotics.  She presents today for evaluation of weakness and generalized pain.  Yesterday patient seemed to be in her normal state of health, however this morning she became weak and unable to ambulate.  She describes pain in her ankles, knees, hips, shoulders, and abdomen that is worse when she attempts to move.  She denies any bowel or bladder complaints.  Patient tells me she had a fever last night to 101, but is currently afebrile.  The history is provided by the patient.  Weakness Severity:  Moderate Onset quality:  Sudden Timing:  Constant Progression:  Unchanged Chronicity:  New Relieved by:  Nothing Worsened by:  Nothing Ineffective treatments:  None tried Back Pain Associated symptoms: leg pain and weakness   Hip Pain  Leg Pain Associated symptoms: back pain        Past Medical History:  Diagnosis Date  . Anxiety   . Atherosclerosis of aortic arch [facility]) April 2017   Noted on CT chest, calcified aortic atherosclerosis at the arch.  . Atrial fibrillation, permanent ([place]) 03/2013   Previously on Atenolol for Rate - now on combination diltiazem and carvedilol for rate Control;  & Xarelto for AC. CHADS2VASC2 = 7.  . Cerebral infarction [facility]) 10/2008   MRI Brain 05/2014 for near syncope revealed: No acure infarct.  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   Mild small vessel disease  type changes.  No intracranial hemorrhage.  Global atrophy.   Marland Kitchen COPD (chronic obstructive pulmonary disease) ([place])   . Coronary atherosclerosis due to calcified coronary lesion April 2017   Noted on CT chest  . Dementia ([place])    following 2010 stroke  . Diastolic dysfunction with chronic heart failure (HCC)    Exacerbated by A. fib RVR  . Hyperlipemia   . Hypertension   . Pneumonia   . PONV (postoperative nausea and vomiting)   . Stroke [facility])    2010- patient has dementia related to stroke   . Tinea unguium   . Tremor   . Tuberculosis     Patient Active Problem List   Diagnosis Date Noted  . Cerebrovascular accident (CVA) due to thrombosis of left middle cerebral artery ([place]) 06/20/2019  . Rectal cancer ([place]) 09/21/2018  . Pulmonary hypertension ([place]) 02/07/2018  . Acute on chronic diastolic heart failure ([place]) 02/07/2018  . Respiratory distress   . Encephalomalacia on imaging study 02/03/2018  . COPD (chronic obstructive pulmonary disease) with emphysema ([place]) 02/03/2018  . Gastrointestinal hemorrhage   . CAP (community acquired pneumonia) 02/02/2018  . Tremor 07/21/2017  . Insomnia 07/21/2017  . Dementia without behavioral disturbance ([place]) 07/21/2016  . Gait abnormality 07/21/2016  . Recurrent pneumonia 12/06/2015  . Multiple lung nodules on CT 11/01/2015  . Chronic diastolic CHF (congestive heart failure) ([place]) 10/30/2015  . Bilateral lower extremity edema 08/05/2015  . Lobar pneumonia due to unspecified organism 07/30/2015  .  Malnutrition of moderate degree 07/29/2015  . Hyponatremia 07/25/2015  . Symptomatic anemia 07/25/2015  . Acute respiratory failure with hypoxia ([place]) 07/24/2015  . Essential hypertension 07/22/2014  . Permanent atrial fibrillation ([name]): CHA2DS2-VASc Score 7 - on Xarelto 07/07/2014  . Hyperlipidemia 07/07/2014  . H/O: stroke 10/29/2008    Past Surgical History:  Procedure Laterality Date  . BIOPSY  06/01/2018   Procedure: BIOPSY;  Surgeon:  Ronnette Juniper, MD;  Location: [facility];  Service: Gastroenterology;;  . BLADDER SURGERY    . CARPAL TUNNEL RELEASE Left   . COLONOSCOPY N/A 06/01/2018   Procedure: COLONOSCOPY;  Surgeon: Ronnette Juniper, MD;  Location: [facility];  Service: Gastroenterology;  Laterality: N/A;  . ESOPHAGOGASTRODUODENOSCOPY (EGD) WITH PROPOFOL N/A 02/04/2018   Procedure: ESOPHAGOGASTRODUODENOSCOPY (EGD) WITH PROPOFOL;  Surgeon: Ronnette Juniper, MD;  Location: [place];  Service: Gastroenterology;  Laterality: N/A;  . FOOT SURGERY Right   . HERNIA REPAIR    . NASAL SINUS SURGERY    . POLYPECTOMY  06/01/2018   Procedure: POLYPECTOMY;  Surgeon: Ronnette Juniper, MD;  Location: [facility];  Service: Gastroenterology;;  . TOTAL ABDOMINAL HYSTERECTOMY       OB History   No obstetric history on file.     Family History  Problem Relation Age of Onset  . Osteoporosis Mother   . Emphysema Father   . Lung disease Father        Black Lung  . Stroke Brother   . Cancer Maternal Grandmother   . Emphysema Paternal Grandmother   . Asthma Daughter        allergy induced    Social History   Tobacco Use  . Smoking status: Former Smoker    Packs/day: 1.00    Years: 16.00    Pack years: 16.00    Start date: 03/30/1954    Quit date: 03/30/1970    Years since quitting: 49.9  . Smokeless tobacco: Never Used  . Tobacco comment: smoked 1/2-1 ppd  Vaping Use  . Vaping Use: Never used  Substance Use Topics  . Alcohol use: Not Currently    Alcohol/week: 0.0 standard drinks  . Drug use: No    Home Medications Prior to Admission medications   Medication Sig Start Date End Date Taking? Authorizing Provider  acetaminophen (TYLENOL) 500 MG tablet Take 1,000 mg by mouth every 6 (six) hours as needed for mild pain, fever or headache.    [provider]  Azelastine HCl 137 MCG/SPRAY SOLN Place 2 sprays into both nostrils 2 (two) times daily. Use in each nostril as directed     [provider]  B Complex  Vitamins (VITAMIN B COMPLEX PO) Take 1 tablet by mouth daily.     [provider]  calcitonin, salmon, (MIACALCIN) 200 UNIT/ACT nasal spray Place 1 spray into alternate nostrils daily.    [provider]  Cholecalciferol (VITAMIN D3 SUPER STRENGTH) 2000 units CAPS Take 2,000 Units by mouth daily.     [provider]  diltiazem (CARDIZEM CD) 120 MG 24 hr capsule TAKE 240 MG (2 TABLETS) DAILY. IF DIZZY OR BLOOD PRESSURE IS LOW MAY TAKE 120 MG ONLY THAT DAY Patient taking differently: Take 240 mg by mouth daily. TAKE 240 MG (2 TABLETS) DAILY. IF DIZZY OR BLOOD PRESSURE IS LOW MAY TAKE 120 MG ONLY THAT DAY 02/28/19   Leonie Man, MD  donepezil (ARICEPT) 10 MG tablet Take 10 mg by mouth at bedtime.    [provider]  Ensure (ENSURE) Take  237 mLs by mouth 3 (three) times daily between meals.    [provider]  escitalopram (LEXAPRO) 20 MG tablet Take 20 mg by mouth daily.    [provider]  ferrous sulfate 325 (65 FE) MG EC tablet Take 325 mg by mouth every other day.    [provider]  Fluticasone-Umeclidin-Vilant (TRELEGY ELLIPTA) 100-62.5-25 MCG/INH AEPB Inhale 1 puff into the lungs daily.    [provider]  furosemide (LASIX) 20 MG tablet Take 20 mg by mouth every other day.    [provider]  lamoTRIgine (LAMICTAL) 100 MG tablet Take 1 tablet (100 mg total) by mouth 2 (two) times daily. 10/31/19   Marcial Pacas, MD  loratadine (CLARITIN) 10 MG tablet Take 10 mg by mouth daily.    [provider]  Melatonin 10 MG TABS Take 10 mg by mouth at bedtime.     [provider]  memantine (NAMENDA) 10 MG tablet Take 1 tablet (10 mg total) by mouth 2 (two) times daily. 09/21/19   Suzzanne Cloud, NP  Multiple Vitamin (MULTIVITAMIN) capsule Take 1 capsule by mouth daily.    [provider]  rivaroxaban (XARELTO) 10 MG TABS tablet Take 1 tablet (10 mg total) by mouth daily. 12/22/19   Leonie Man, MD    simvastatin (ZOCOR) 40 MG tablet Take 40 mg by mouth daily.    [provider]  Wheat Dextrin (BENEFIBER DRINK MIX PO) Take 10 mLs by mouth daily. Mix with water     [provider]    Allergies    Topiramate and Tetanus toxoids  Review of Systems   Review of Systems  Musculoskeletal: Positive for back pain.  Neurological: Positive for weakness.  All other systems reviewed and are negative.   Physical Exam Updated Vital Signs BP 125/76   Pulse 91   Temp 97.6 F (36.4 C) (Oral)   Resp (!) 31   Ht 5' 6"  (1.676 m)   Wt 59 kg   SpO2 100%   BMI 20.99 kg/m   Physical Exam Vitals and nursing note reviewed.  Constitutional:      General: She is not in acute distress.    Appearance: She is well-developed. She is not diaphoretic.  HENT:     Head: Normocephalic and atraumatic.     Mouth/Throat:     Mouth: Mucous membranes are moist.  Cardiovascular:     Rate and Rhythm: Normal rate and regular rhythm.     Heart sounds: No murmur heard.  No friction rub. No gallop.   Pulmonary:     Effort: Pulmonary effort is normal. No respiratory distress.     Breath sounds: Normal breath sounds. No wheezing.  Abdominal:     General: Bowel sounds are normal. There is no distension.     Palpations: Abdomen is soft.     Tenderness: There is no abdominal tenderness.  Musculoskeletal:        General: Normal range of motion.     Cervical back: Normal range of motion and neck supple.     Comments: Bilateral hips, knees, ankles, and shoulders appears grossly normal.  There are no palpable effusions, erythema, or warmth.  Skin:    General: Skin is warm and dry.  Neurological:     Mental Status: She is alert and oriented to person, place, and time.     ED Results / Procedures / Treatments   Labs (all labs ordered are listed, but only abnormal results are  displayed) Labs Reviewed  RESP PANEL BY RT-PCR (FLU A&B, COVID) [id number]  COMPREHENSIVE METABOLIC PANEL  CBC WITH  DIFFERENTIAL/PLATELET  URINALYSIS, ROUTINE W REFLEX MICROSCOPIC  TROPONIN I (HIGH SENSITIVITY)    EKG EKG Interpretation  Date/Time:  Wednesday February 28 2020 09:38:40 EST Ventricular Rate:  95 PR Interval:    QRS Duration: 85 QT Interval:  409 QTC Calculation: 506 R Axis:   10 Text Interpretation: Atrial fibrillation Anterior infarct, old Prolonged QT interval No significant change since 09/13/2019 Confirmed by Veryl Speak [phone]) on 02/28/2020 9:46:44 AM   Radiology No results found.  Procedures Procedures (including critical care time)  Medications Ordered in ED Medications - No data to display  ED Course  I have reviewed the triage vital signs and the nursing notes.  Pertinent labs & imaging results that were available during my care of the patient were reviewed by me and considered in my medical decision making (see chart for details).    MDM Rules/Calculators/A&P  Patient is an 86 year old female with past medical history as described in the HPI.  She presents today for evaluation of weakness and generalized pain as described in the HPI.  Patient's physical examination is nonfocal.  She describes pain throughout multiple joints that has made it difficult for her to walk.  This seemed to worsen through the night and came on acutely.  Work-up initiated including laboratory studies, urinalysis, EKG, and chest x-ray.  Patient does have nitrite positive urine, but remainder of work-up is otherwise unremarkable.  There was a delay in obtaining the results of her blood work due to laboratory reasons that remain unclear to me.  Both the nurse and myself called several times to investigate the delay, but never received a definite explanation.    When I returned to the patient's room to discuss the results, I was met with a very angry son-in-law.  He was displeased about the delay, to which I tried to explain what had transpired with the lab.  He clearly had no interest in  listening to what I had to say as he would not allow me to speak without repeatedly interrupting, shouting, and verbally abusing me.  On several occasions, the patient herself told him to "shut up".  At one point, he approached me in an aggressive and threatening manner, and I politely asked him to back away.    After an unpleasant conversation regarding his mother-in-law's test results and care plan, the son-in-law bluntly informed me that his mother-in-law was "now my problem" and that he was leaving.  He told me that it was "the law" that we had take care of her.  He then left and informed me he was not coming back.  I spoke with the hospitalist who has agreed to admit the patient for overnight observation.  We will have PT/OT work with her and see if her issues improve.  Patient may require rehab facility placement if another reason for her symptoms is not found and she does not improve.  Final Clinical Impression(s) / ED Diagnoses Final diagnoses:  None    Rx / DC Orders ED Discharge Orders    None       Veryl Speak, MD 02/28/20 1609    Veryl Speak, MD 02/29/20 [phone]

## 2020-02-28 NOTE — ED Notes (Signed)
Pt transported to Xray. 

## 2020-02-29 DIAGNOSIS — I2510 Atherosclerotic heart disease of native coronary artery without angina pectoris: Secondary | ICD-10-CM | POA: Diagnosis present

## 2020-02-29 DIAGNOSIS — J439 Emphysema, unspecified: Secondary | ICD-10-CM | POA: Diagnosis not present

## 2020-02-29 DIAGNOSIS — E440 Moderate protein-calorie malnutrition: Secondary | ICD-10-CM | POA: Diagnosis present

## 2020-02-29 DIAGNOSIS — E785 Hyperlipidemia, unspecified: Secondary | ICD-10-CM | POA: Diagnosis present

## 2020-02-29 DIAGNOSIS — M6259 Muscle wasting and atrophy, not elsewhere classified, multiple sites: Secondary | ICD-10-CM | POA: Diagnosis not present

## 2020-02-29 DIAGNOSIS — Z923 Personal history of irradiation: Secondary | ICD-10-CM | POA: Diagnosis not present

## 2020-02-29 DIAGNOSIS — Z79899 Other long term (current) drug therapy: Secondary | ICD-10-CM | POA: Diagnosis not present

## 2020-02-29 DIAGNOSIS — M25559 Pain in unspecified hip: Secondary | ICD-10-CM | POA: Diagnosis not present

## 2020-02-29 DIAGNOSIS — G9341 Metabolic encephalopathy: Secondary | ICD-10-CM | POA: Diagnosis present

## 2020-02-29 DIAGNOSIS — R32 Unspecified urinary incontinence: Secondary | ICD-10-CM | POA: Diagnosis present

## 2020-02-29 DIAGNOSIS — Z23 Encounter for immunization: Secondary | ICD-10-CM | POA: Diagnosis not present

## 2020-02-29 DIAGNOSIS — C20 Malignant neoplasm of rectum: Secondary | ICD-10-CM | POA: Diagnosis not present

## 2020-02-29 DIAGNOSIS — Z87891 Personal history of nicotine dependence: Secondary | ICD-10-CM | POA: Diagnosis not present

## 2020-02-29 DIAGNOSIS — Z7401 Bed confinement status: Secondary | ICD-10-CM | POA: Diagnosis not present

## 2020-02-29 DIAGNOSIS — I4821 Permanent atrial fibrillation: Secondary | ICD-10-CM | POA: Diagnosis present

## 2020-02-29 DIAGNOSIS — I4891 Unspecified atrial fibrillation: Secondary | ICD-10-CM | POA: Diagnosis not present

## 2020-02-29 DIAGNOSIS — F0390 Unspecified dementia without behavioral disturbance: Secondary | ICD-10-CM | POA: Diagnosis present

## 2020-02-29 DIAGNOSIS — I5032 Chronic diastolic (congestive) heart failure: Secondary | ICD-10-CM | POA: Diagnosis present

## 2020-02-29 DIAGNOSIS — Z66 Do not resuscitate: Secondary | ICD-10-CM | POA: Diagnosis present

## 2020-02-29 DIAGNOSIS — I2584 Coronary atherosclerosis due to calcified coronary lesion: Secondary | ICD-10-CM | POA: Diagnosis present

## 2020-02-29 DIAGNOSIS — D509 Iron deficiency anemia, unspecified: Secondary | ICD-10-CM | POA: Diagnosis present

## 2020-02-29 DIAGNOSIS — Z20822 Contact with and (suspected) exposure to covid-19: Secondary | ICD-10-CM | POA: Diagnosis present

## 2020-02-29 DIAGNOSIS — J449 Chronic obstructive pulmonary disease, unspecified: Secondary | ICD-10-CM | POA: Diagnosis present

## 2020-02-29 DIAGNOSIS — E860 Dehydration: Secondary | ICD-10-CM | POA: Diagnosis present

## 2020-02-29 DIAGNOSIS — Z8673 Personal history of transient ischemic attack (TIA), and cerebral infarction without residual deficits: Secondary | ICD-10-CM | POA: Diagnosis not present

## 2020-02-29 DIAGNOSIS — I5033 Acute on chronic diastolic (congestive) heart failure: Secondary | ICD-10-CM | POA: Diagnosis not present

## 2020-02-29 DIAGNOSIS — Z85048 Personal history of other malignant neoplasm of rectum, rectosigmoid junction, and anus: Secondary | ICD-10-CM | POA: Diagnosis not present

## 2020-02-29 DIAGNOSIS — M2550 Pain in unspecified joint: Secondary | ICD-10-CM | POA: Diagnosis not present

## 2020-02-29 DIAGNOSIS — S32475D Nondisplaced fracture of medial wall of left acetabulum, subsequent encounter for fracture with routine healing: Secondary | ICD-10-CM | POA: Diagnosis not present

## 2020-02-29 DIAGNOSIS — Z9221 Personal history of antineoplastic chemotherapy: Secondary | ICD-10-CM | POA: Diagnosis not present

## 2020-02-29 DIAGNOSIS — I63312 Cerebral infarction due to thrombosis of left middle cerebral artery: Secondary | ICD-10-CM | POA: Diagnosis not present

## 2020-02-29 DIAGNOSIS — Z7901 Long term (current) use of anticoagulants: Secondary | ICD-10-CM | POA: Diagnosis not present

## 2020-02-29 DIAGNOSIS — Z90710 Acquired absence of both cervix and uterus: Secondary | ICD-10-CM | POA: Diagnosis not present

## 2020-02-29 DIAGNOSIS — I959 Hypotension, unspecified: Secondary | ICD-10-CM | POA: Diagnosis not present

## 2020-02-29 DIAGNOSIS — I110 Hypertensive heart disease with heart failure: Secondary | ICD-10-CM | POA: Diagnosis present

## 2020-02-29 DIAGNOSIS — R531 Weakness: Secondary | ICD-10-CM | POA: Diagnosis not present

## 2020-02-29 DIAGNOSIS — F39 Unspecified mood [affective] disorder: Secondary | ICD-10-CM | POA: Diagnosis not present

## 2020-02-29 DIAGNOSIS — N390 Urinary tract infection, site not specified: Secondary | ICD-10-CM | POA: Diagnosis present

## 2020-02-29 DIAGNOSIS — D500 Iron deficiency anemia secondary to blood loss (chronic): Secondary | ICD-10-CM | POA: Diagnosis not present

## 2020-02-29 LAB — BASIC METABOLIC PANEL
Anion gap: 10 (ref 5–15)
BUN: 15 mg/dL (ref 8–23)
CO2: 22 mmol/L (ref 22–32)
Calcium: 8.7 mg/dL — ABNORMAL LOW (ref 8.9–10.3)
Chloride: 105 mmol/L (ref 98–111)
Creatinine, Ser: 0.71 mg/dL (ref 0.44–1.00)
GFR, Estimated: >60 mL/min (ref >60)
Glucose, Bld: 102 mg/dL — ABNORMAL HIGH (ref 70–99)
Potassium: 3.4 mmol/L — ABNORMAL LOW (ref 3.5–5.1)
Sodium: 137 mmol/L (ref 135–145)

## 2020-02-29 LAB — C-REACTIVE PROTEIN: CRP: 6.1 mg/dL — ABNORMAL HIGH (ref <1.0)

## 2020-02-29 LAB — CBC
HCT: 32.1 % — ABNORMAL LOW (ref 36.0–46.0)
Hemoglobin: 10.1 g/dL — ABNORMAL LOW (ref 12.0–15.0)
MCH: 31 pg (ref 26.0–34.0)
MCHC: 31.5 g/dL (ref 30.0–36.0)
MCV: 98.5 fL (ref 80.0–100.0)
Platelets: 224 10*3/uL (ref 150–400)
RBC: 3.26 MIL/uL — ABNORMAL LOW (ref 3.87–5.11)
RDW: 13.5 % (ref 11.5–15.5)
WBC: 5.5 10*3/uL (ref 4.0–10.5)
nRBC: 0 % (ref 0.0–0.2)

## 2020-02-29 LAB — URINE CULTURE: Culture: <10000 — AB

## 2020-02-29 LAB — MAGNESIUM: Magnesium: 1.8 mg/dL (ref 1.7–2.4)

## 2020-02-29 MED ORDER — ATORVASTATIN CALCIUM 10 MG PO TABS
20.0000 mg | ORAL_TABLET | Freq: Every day | ORAL | Status: DC
  Administered 2020-03-01 – 2020-03-03 (×3): 20 mg via ORAL
  Filled 2020-02-29 (×4): qty 2

## 2020-02-29 NOTE — Evaluation (Signed)
Occupational Therapy Evaluation Patient Details Name: Jaxon Mynhier MRN: [id number] DOB: 11-26-1932 Today's Date: 02/29/2020    History of Present Illness 86 year old female with past medical history of atrial fibrillation, COPD, hypertension, dementia.  Patient recently diagnosed with pelvic fracture being treated with weightbearing as tolerated as well as recent UTI currently on antibiotics.  She to ED on 12/1 for evaluation of weakness and generalized pain.   Clinical Impression   Pt presents with decline in function and safety with ADLs and ADL mobility with impaired strength, balance and endurance; pt sith hx of dementia/cognitve impairments. Pt would benefit from acute OT services to address impairments to maximize level of function and safety    Follow Up Recommendations  SNF    Equipment Recommendations  Other (comment) (TBD at next venue of care)    Recommendations for Other Services       Precautions / Restrictions Precautions Precautions: Fall Restrictions Weight Bearing Restrictions: No      Mobility Bed Mobility Overal bed mobility: Needs Assistance             General bed mobility comments: up in chair upon arrival    Transfers Overall transfer level: Needs assistance Equipment used: Rolling walker (2 wheeled) Transfers: Sit to/from Stand Sit to Stand: Min assist         General transfer comment: min assist for power up, steadying, completion of rise to stand.    Balance Overall balance assessment: Needs assistance;History of Falls Sitting-balance support: No upper extremity supported Sitting balance-Leahy Scale: Fair     Standing balance support: Bilateral upper extremity supported;During functional activity Standing balance-Leahy Scale: Poor Standing balance comment: reliant on external assist                           ADL either performed or assessed with clinical judgement   ADL Overall ADL's : Needs  assistance/impaired Eating/Feeding: Independent   Grooming: Wash/dry hands;Wash/dry face;Min guard;Standing;Cueing for safety   Upper Body Bathing: Set up;Supervision/ safety;Sitting   Lower Body Bathing: Moderate assistance   Upper Body Dressing : Set up;Supervision/safety;Sitting   Lower Body Dressing: Moderate assistance   Toilet Transfer: Minimal assistance;Ambulation;RW;Cueing for safety;Cueing for sequencing;Regular Toilet;Comfort height toilet;Grab bars   Toileting- Clothing Manipulation and Hygiene: Minimal assistance;Sit to/from stand;Cueing for safety       Functional mobility during ADLs: Minimal assistance;Rolling walker;Cueing for sequencing;Cueing for safety       Vision Baseline Vision/History: Wears glasses Patient Visual Report: No change from baseline       Perception     Praxis      Pertinent Vitals/Pain Pain Assessment: 0-10 Pain Score: 2  Faces Pain Scale: Hurts a little bit Pain Location: generalized with mobility Pain Descriptors / Indicators: Discomfort;Grimacing Pain Intervention(s): Limited activity within patient's tolerance;Monitored during session;Repositioned     Hand Dominance Right   Extremity/Trunk Assessment Upper Extremity Assessment Upper Extremity Assessment: Generalized weakness   Lower Extremity Assessment Lower Extremity Assessment: Defer to PT evaluation   Cervical / Trunk Assessment Cervical / Trunk Assessment: Kyphotic   Communication Communication Communication: No difficulties   Cognition Arousal/Alertness: Awake/alert Behavior During Therapy: WFL for tasks assessed/performed Overall Cognitive Status: History of cognitive impairments - at baseline                                    General Comments   Pt very pleasant and cooperative  Exercises     Shoulder Instructions      Home Living Family/patient expects to be discharged to:: Private residence Living Arrangements:  Children Available Help at Discharge: Family;Available 24 hours/day Type of Home: House Home Access: Stairs to enter [employer] of Steps: 1 after an uphill Theatre stage manager: None Home Layout: Able to live on main level with bedroom/bathroom     Bathroom Shower/Tub: Occupational psychologist: Standard     Home Equipment: Environmental consultant - 2 wheels;Shower seat          Prior Functioning/Environment Level of Independence: Needs assistance  Gait / Transfers Assistance Needed: walks with walker, uses RW ADL's / Homemaking Assistance Needed: Ind with ADLs/selfcare,  grandson makes meals for her            OT Problem List: Decreased strength;Impaired balance (sitting and/or standing);Decreased activity tolerance;Decreased knowledge of use of DME or AE;Decreased safety awareness;Pain      OT Treatment/Interventions: Self-care/ADL training;Therapeutic exercise;DME and/or AE instruction;Therapeutic activities;Patient/family education    OT Goals(Current goals can be found in the care plan section) Acute Rehab OT Goals Patient Stated Goal: go home when ready OT Goal Formulation: With patient Time For Goal Achievement: 03/14/20 Potential to Achieve Goals: Good ADL Goals Pt Will Perform Grooming: with supervision;standing Pt Will Perform Lower Body Bathing: with min assist;sit to/from stand Pt Will Perform Lower Body Dressing: with min assist;sit to/from stand Pt Will Transfer to Toilet: with min guard assist;with supervision;ambulating;grab bars Pt Will Perform Toileting - Clothing Manipulation and hygiene: with min guard assist;with supervision;sit to/from stand  OT Frequency: Min 2X/week   Barriers to D/C:            Co-evaluation              AM-PAC OT "6 Clicks" Daily Activity     Outcome Measure Help from another person eating meals?: None Help from another person taking care of personal grooming?: A Little Help from another person  toileting, which includes using toliet, bedpan, or urinal?: A Lot Help from another person bathing (including washing, rinsing, drying)?: A Lot Help from another person to put on and taking off regular upper body clothing?: A Little Help from another person to put on and taking off regular lower body clothing?: A Lot 6 Click Score: 16   End of Session Equipment Utilized During Treatment: Gait belt;Rolling walker  Activity Tolerance: Patient tolerated treatment well Patient left: in chair;with call bell/phone within reach;with chair alarm set  OT Visit Diagnosis: Unsteadiness on feet (R26.81);Other abnormalities of gait and mobility (R26.89);History of falling (Z91.81);Other symptoms and signs involving cognitive function;Pain Pain - Right/Left:  (generalized)                Time: 0100-7121 OT Time Calculation (min): 29 min Charges:  OT General Charges $OT Visit: 1 Visit OT Evaluation $OT Eval Low Complexity: 1 Low OT Treatments $Self Care/Home Management : 8-22 mins    Tanishi, [place] 02/29/2020, 4:11 PM

## 2020-02-29 NOTE — TOC Initial Note (Signed)
Transition of Care [facility]) - Initial/Assessment Note    Patient Details  Name: Megan Munoz MRN: [id number] Date of Birth: 1932/10/08  [facility] CM/SW Contact:    Marilu Favre, RN Phone Number: 02/29/2020, 3:37 PM  Clinical Narrative:                 Spoke to patient at bedside. Patient agreeable to go to SNF for short term rehab. Patient consented for NCM to call her daughter Maudie Mercury. Kim also in agreement for SNF. Patient has never been to SNF before.   NCM explained once receive offers will provide offers to patient and Maudie Mercury. In meantime Maudie Mercury will look up medicare.gov website to see ratings etc.   PASARR submitted.  Expected Discharge Plan: [place]     Patient Goals and CMS Choice Patient states their goals for this hospitalization and ongoing recovery are:: to get stronger CMS Medicare.gov Compare Post Acute Care list provided to:: Patient Choice offered to / list presented to : Patient, Spouse  Expected Discharge Plan and Services Expected Discharge Plan: [place]   Discharge Planning Services: CM Consult Post Acute Care Choice: [place] Living arrangements for the past 2 months: Single Family Home                 DME Arranged: N/A DME Agency: NA       HH Arranged: NA          Prior Living Arrangements/Services Living arrangements for the past 2 months: Single Family Home Lives with:: Adult Children Patient language and need for interpreter reviewed:: Yes Do you feel safe going back to the place where you live?: Yes      Need for Family Participation in Patient Care: Yes (Comment) Care giver support system in place?: Yes (comment)   Criminal Activity/Legal Involvement Pertinent to Current Situation/Hospitalization: No - Comment as needed  Activities of Daily Living Home Assistive Devices/Equipment: None ADL Screening (condition at time of admission) Patient's cognitive ability adequate to safely  complete daily activities?: Yes Is the patient deaf or have difficulty hearing?: No Does the patient have difficulty seeing, even when wearing glasses/contacts?: No Does the patient have difficulty concentrating, remembering, or making decisions?: No Patient able to express need for assistance with ADLs?: Yes Does the patient have difficulty dressing or bathing?: Yes Independently performs ADLs?: Yes (appropriate for developmental age) Does the patient have difficulty walking or climbing stairs?: Yes Weakness of Legs: Both Weakness of Arms/Hands: None  Permission Sought/Granted   Permission granted to share information with : Yes, Verbal Permission Granted  Share Information with NAME: Daughter Cindie Crumbly           Emotional Assessment Appearance:: Appears stated age Attitude/Demeanor/Rapport: Engaged Affect (typically observed): Accepting Orientation: : Oriented to Self, Oriented to Place, Oriented to Situation, Oriented to  Time Alcohol / Substance Use: Not Applicable Psych Involvement: No (comment)  Admission diagnosis:  Hip pain [M25.559] Weakness [R53.1] Patient Active Problem List   Diagnosis Date Noted  . Weakness 02/28/2020  . Urinary tract infection 02/28/2020  . Chronic anticoagulation 02/28/2020  . DNR (do not resuscitate) 02/28/2020  . Cerebrovascular accident (CVA) due to thrombosis of left middle cerebral artery ([place]) 06/20/2019  . Rectal cancer ([place]) 09/21/2018  . Pulmonary hypertension ([place]) 02/07/2018  . Acute on chronic diastolic heart failure ([place]) 02/07/2018  . Respiratory distress   . Encephalomalacia on imaging study 02/03/2018  . COPD (chronic obstructive pulmonary disease) with emphysema ([place])  02/03/2018  . Gastrointestinal hemorrhage   . CAP (community acquired pneumonia) 02/02/2018  . Tremor 07/21/2017  . Insomnia 07/21/2017  . Dementia without behavioral disturbance ([place]) 07/21/2016  . Gait abnormality 07/21/2016  . Recurrent pneumonia  12/06/2015  . Multiple lung nodules on CT 11/01/2015  . Chronic diastolic CHF (congestive heart failure) ([place]) 10/30/2015  . Bilateral lower extremity edema 08/05/2015  . Lobar pneumonia due to unspecified organism 07/30/2015  . Malnutrition of moderate degree 07/29/2015  . Hyponatremia 07/25/2015  . Symptomatic anemia 07/25/2015  . Acute respiratory failure with hypoxia ([place]) 07/24/2015  . Essential hypertension 07/22/2014  . Permanent atrial fibrillation ([place]): CHA2DS2-VASc Score 7 - on Xarelto 07/07/2014  . Hyperlipidemia 07/07/2014  . H/O: stroke 10/29/2008   PCP:  Fanny Bien, MD Pharmacy:   [facility] - [address] [place] [address] Phone: [phone] Fax: [place], [place] - [address] [place] [address] Phone: [phone] Fax: [phone]     Social Determinants of Health (SDOH) Interventions    Readmission Risk Interventions No flowsheet data found.

## 2020-02-29 NOTE — Evaluation (Signed)
Physical Therapy Evaluation Patient Details Name: Megan Munoz MRN: [id number] DOB: [birth year] Today's Date: 02/29/2020   History of Present Illness  86 year old female with past medical history of atrial fibrillation, COPD, hypertension, dementia.  Patient recently diagnosed with pelvic fracture being treated with weightbearing as tolerated as well as recent UTI currently on antibiotics.  She to ED on 12/1 for evaluation of weakness and generalized pain.  Clinical Impression   Pt presents with generalized weakness, impaired standing balance, and decreased activity tolerance vs baseline. Pt to benefit from acute PT to address deficits. Pt ambulated very short room distance, limited by fatigue and pain. PT recommending ST-SNF level of care post-acutely. PT to progress mobility as tolerated, and will continue to follow acutely.      Follow Up Recommendations SNF;Supervision/Assistance - 24 hour    Equipment Recommendations  None recommended by PT    Recommendations for Other Services       Precautions / Restrictions Precautions Precautions: Fall Restrictions Weight Bearing Restrictions: No      Mobility  Bed Mobility Overal bed mobility: Needs Assistance             General bed mobility comments: up in chair    Transfers Overall transfer level: Needs assistance Equipment used: Rolling walker (2 wheeled) Transfers: Sit to/from Stand Sit to Stand: Min assist         General transfer comment: min assist for power up, steadying, completion of rise to stand.  Ambulation/Gait Ambulation/Gait assistance: Min assist Gait Distance (Feet): 5 Feet Assistive device: Rolling walker (2 wheeled) Gait Pattern/deviations: Step-through pattern;Decreased stride length;Trunk flexed Gait velocity: decr   General Gait Details: min assist to steady, physically maneuver RW. Fatigues after very short gait distance  Stairs            Wheelchair Mobility    Modified Rankin  (Stroke Patients Only)       Balance Overall balance assessment: Needs assistance;History of Falls (3 falls in the past 6 months) Sitting-balance support: No upper extremity supported Sitting balance-Leahy Scale: Fair     Standing balance support: Bilateral upper extremity supported;During functional activity Standing balance-Leahy Scale: Poor Standing balance comment: reliant on external assist                             Pertinent Vitals/Pain Pain Assessment: Faces Faces Pain Scale: Hurts a little bit Pain Location: generalized with mobility Pain Descriptors / Indicators: Discomfort;Grimacing Pain Intervention(s): Limited activity within patient's tolerance;Monitored during session;Repositioned    Home Living Family/patient expects to be discharged to:: Private residence Living Arrangements: Children Available Help at Discharge: Family;Available 24 hours/day (daughter cannot physically assist, grandsons 18/16/14 assist as they can around school. STates her son-in-law helps "in a sense") Type of Home: House Home Access: Stairs to enter Entrance Stairs-Rails: None Entrance Stairs-Number of Steps: 1 after an uphill driveway Home Layout: Able to live on main level with bedroom/bathroom Home Equipment: Walker - 2 wheels;Shower seat      Prior Function Level of Independence: Needs assistance   Gait / Transfers Assistance Needed: walks with walker, uses RW  ADL's / Homemaking Assistance Needed: Pt's grandson Megan Munoz makes meals for her  Comments: PT asks if pt's son in law works, and she states "I don't know"     Hand Dominance   Dominant Hand: Right    Extremity/Trunk Assessment   Upper Extremity Assessment Upper Extremity Assessment: Generalized weakness    Lower Extremity Assessment Lower  Extremity Assessment: Generalized weakness    Cervical / Trunk Assessment Cervical / Trunk Assessment: Kyphotic  Communication   Communication: No difficulties   Cognition Arousal/Alertness: Awake/alert Behavior During Therapy: Restless Overall Cognitive Status: History of cognitive impairments - at baseline                                 General Comments: history of dementia, answers questions appropriately but requires increased time. Memory difficulty noted, cannot remember names all 3 grandsons who live with her.      General Comments      Exercises     Assessment/Plan    PT Assessment Patient needs continued PT services  PT Problem List Decreased strength;Decreased mobility;Decreased activity tolerance;Decreased balance;Decreased knowledge of use of DME;Pain;Decreased cognition       PT Treatment Interventions DME instruction;Therapeutic activities;Gait training;Therapeutic exercise;Patient/family education;Balance training;Functional mobility training;Neuromuscular re-education    PT Goals (Current goals can be found in the Care Plan section)  Acute Rehab PT Goals Patient Stated Goal: go home when ready PT Goal Formulation: With patient Time For Goal Achievement: 03/14/20 Potential to Achieve Goals: Good    Frequency Min 2X/week   Barriers to discharge Decreased caregiver support      Co-evaluation               AM-PAC PT "6 Clicks" Mobility  Outcome Measure Help needed turning from your back to your side while in a flat bed without using bedrails?: A Little Help needed moving from lying on your back to sitting on the side of a flat bed without using bedrails?: A Little Help needed moving to and from a bed to a chair (including a wheelchair)?: A Little Help needed standing up from a chair using your arms (e.g., wheelchair or bedside chair)?: A Little Help needed to walk in hospital room?: A Lot Help needed climbing 3-5 steps with a railing? : A Lot 6 Click Score: 16    End of Session Equipment Utilized During Treatment: Gait belt Activity Tolerance: Patient limited by fatigue Patient left: in  chair;with call bell/phone within reach;with chair alarm set Nurse Communication: Mobility status PT Visit Diagnosis: Difficulty in walking, not elsewhere classified (R26.2);History of falling (Z91.81)    Time: 8921-1941 PT Time Calculation (min) (ACUTE ONLY): 22 min   Charges:   PT Evaluation $PT Eval Low Complexity: 1 Low          Marquese Burkland E, PT Acute Rehabilitation Services Pager [phone]  Office [phone]    Jareli Highland D Elonda Husky 02/29/2020, 1:49 PM

## 2020-02-29 NOTE — Progress Notes (Signed)
PROGRESS NOTE    Megan Munoz  BWL:[id number] DOB: March 15, 1933 DOA: 02/28/2020 PCP: Fanny Bien, MD    Brief Narrative:  86 year old female, who resides at home with her daughter, brought to the hospital with increasing weakness, confusion.  She has known right-sided pelvic fracture.  She was noted to be dehydrated in rapid atrial fib.  Patient was started on IV fluids.  Overall rate control has improved.  Dehydration has improved as has mental status.  Seen by physical therapy with recommendations for skilled nursing facility placement  Assessment & Plan:   Principal Problem:   Urinary tract infection Active Problems:   Permanent atrial fibrillation ([place]): CHA2DS2-VASc Score 7 - on Xarelto   Chronic diastolic CHF (congestive heart failure) (H[facility])   Dementia without behavioral disturbance (H[facility])   Weakness   Chronic anticoagulation   DNR (do not resuscitate)   UTI (urinary tract infection)   Weakness and increased confusion -Likely secondary to dehydration -Initial concerns for urinary tract infection, although urine culture did not show any significant growth -She was started on ceftriaxone, but with negative urine culture, will discontinue further antibiotics -Overall mental status has improved and appears to be approaching baseline  Pelvic fracture -Noted to have right pelvic fracture around acetabulum -Management will be nonoperative -Continue physical therapy -Weightbearing as tolerated -Continue pain management  Persistent atrial fibrillation -Heart rate currently stable on Cardizem -She is anticoagulated with Xarelto  COPD -No shortness of breath or wheezing at this time -Continue on Trelegy and bronchodilators  Chronic diastolic congestive heart failure -Appears to be compensated -Continue on home dose of Lasix  Hyperlipidemia -Continue statin -Simvastatin changed to atorvastatin due to high risk of rhabdomyolysis with concurrent diltiazem   DVT  prophylaxis: rivaroxaban (XARELTO) tablet 10 mg Start: 02/28/20 1645 rivaroxaban (XARELTO) tablet 10 mg  Code Status: DNR Family Communication: discussed with daughter over the phone Disposition Plan: Status is: Inpatient  Remains inpatient appropriate because:Unsafe d/c plan   Dispo: The patient is from: Home              Anticipated d/c is to: SNF              Anticipated d/c date is: 1 day              Patient currently is medically stable to d/c.   Consultants:     Procedures:     Antimicrobials:   Ceftriaxone 12/1>12/2    Subjective: Denies any specific pain, no shortness of breath, mental status improved  Objective: Vitals:   02/29/20 0155 02/29/20 0525 02/29/20 1406 02/29/20 2053  BP: 115/61 (!) 117/56 (!) 112/59 123/60  Pulse: 90 87 66 69  Resp: 18 17 18 18   Temp: 99 F (37.2 C) 98.4 F (36.9 C) 98.4 F (36.9 C) 99.3 F (37.4 C)  TempSrc:   Oral Oral  SpO2: 93% 92% 95% 98%  Weight:      Height:        Intake/Output Summary (Last 24 hours) at 02/29/2020 2110 Last data filed at 02/29/2020 1500 Gross per 24 hour  Intake 100 ml  Output --  Net 100 ml   Filed Weights   02/28/20 0940  Weight: 59 kg    Examination:  General exam: Appears calm and comfortable  Respiratory system: Clear to auscultation. Respiratory effort normal. Cardiovascular system: S1 & S2 heard, RRR. No JVD, murmurs, rubs, gallops or clicks. No pedal edema. Gastrointestinal system: Abdomen is nondistended, soft and nontender. No organomegaly or masses felt.  Normal bowel sounds heard. Central nervous system: Alert and oriented. No focal neurological deficits. Extremities: Symmetric 5 x 5 power. Skin: No rashes, lesions or ulcers Psychiatry: Judgement and insight appear normal. Mood & affect appropriate.     Data Reviewed: I have personally reviewed following labs and imaging studies  CBC: Recent Labs  Lab 02/28/20 1019 02/29/20 0420  WBC 6.4 5.5  NEUTROABS 5.2  --    HGB 10.9* 10.1*  HCT 35.9* 32.1*  MCV 100.6* 98.5  PLT 234 355   Basic Metabolic Panel: Recent Labs  Lab 02/28/20 1019 02/29/20 0420  NA 142 137  K 3.9 3.4*  CL 105 105  CO2 24 22  GLUCOSE 79 102*  BUN 21 15  CREATININE 0.68 0.71  CALCIUM 9.4 8.7*  MG  --  1.8   GFR: Estimated Creatinine Clearance: 47.9 mL/min (by C-G formula based on SCr of 0.71 mg/dL). Liver Function Tests: Recent Labs  Lab 02/28/20 1019  AST 27  ALT 14  ALKPHOS 77  BILITOT 0.9  PROT 6.4*  ALBUMIN 3.5   No results for input(s): LIPASE, AMYLASE in the last 168 hours. No results for input(s): AMMONIA in the last 168 hours. Coagulation Profile: No results for input(s): INR, PROTIME in the last 168 hours. Cardiac Enzymes: Recent Labs  Lab 02/28/20 1019  CKTOTAL 46   BNP (last 3 results) No results for input(s): PROBNP in the last 8760 hours. HbA1C: No results for input(s): HGBA1C in the last 72 hours. CBG: No results for input(s): GLUCAP in the last 168 hours. Lipid Profile: No results for input(s): CHOL, HDL, LDLCALC, TRIG, CHOLHDL, LDLDIRECT in the last 72 hours. Thyroid Function Tests: No results for input(s): TSH, T4TOTAL, FREET4, T3FREE, THYROIDAB in the last 72 hours. Anemia Panel: No results for input(s): VITAMINB12, FOLATE, FERRITIN, TIBC, IRON, RETICCTPCT in the last 72 hours. Sepsis Labs: No results for input(s): PROCALCITON, LATICACIDVEN in the last 168 hours.  Recent Results (from the past 240 hour(s))  Resp Panel by RT-PCR (Flu A&B, Covid) Nasopharyngeal Swab     Status: None   Collection Time: 02/28/20 10:19 AM   Specimen: Nasopharyngeal Swab; Nasopharyngeal(NP) swabs in vial transport medium  Result Value Ref Range Status   SARS Coronavirus 2 by RT PCR NEGATIVE NEGATIVE Final    Comment: (NOTE) SARS-CoV-2 target nucleic acids are NOT DETECTED.  The SARS-CoV-2 RNA is generally detectable in upper respiratory specimens during the acute phase of infection. The  lowest concentration of SARS-CoV-2 viral copies this assay can detect is 138 copies/mL. A negative result does not preclude SARS-Cov-2 infection and should not be used as the sole basis for treatment or other patient management decisions. A negative result may occur with  improper specimen collection/handling, submission of specimen other than nasopharyngeal swab, presence of viral mutation(s) within the areas targeted by this assay, and inadequate number of viral copies(<138 copies/mL). A negative result must be combined with clinical observations, patient history, and epidemiological information. The expected result is Negative.  Fact Sheet for Patients:  EntrepreneurPulse.com.au  Fact Sheet for Healthcare Providers:  IncredibleEmployment.be  This test is no t yet approved or cleared by the [place] FDA and  has been authorized for detection and/or diagnosis of SARS-CoV-2 by FDA under an Emergency Use Authorization (EUA). This EUA will remain  in effect (meaning this test can be used) for the duration of the COVID-19 declaration under Section 564(b)(1) of the Act, 21 U.S.C.section 360bbb-3(b)(1), unless the authorization is terminated  or revoked sooner.  Influenza A by PCR NEGATIVE NEGATIVE Final   Influenza B by PCR NEGATIVE NEGATIVE Final    Comment: (NOTE) The Xpert Xpress SARS-CoV-2/FLU/RSV plus assay is intended as an aid in the diagnosis of influenza from Nasopharyngeal swab specimens and should not be used as a sole basis for treatment. Nasal washings and aspirates are unacceptable for Xpert Xpress SARS-CoV-2/FLU/RSV testing.  Fact Sheet for Patients: EntrepreneurPulse.com.au  Fact Sheet for Healthcare Providers: IncredibleEmployment.be  This test is not yet approved or cleared by the [place] FDA and has been authorized for detection and/or diagnosis of SARS-CoV-2 by FDA under  an Emergency Use Authorization (EUA). This EUA will remain in effect (meaning this test can be used) for the duration of the COVID-19 declaration under Section 564(b)(1) of the Act, 21 U.S.C. section 360bbb-3(b)(1), unless the authorization is terminated or revoked.  Performed at [facility], [place] [address], [place], [place] [address]   Culture, Urine     Status: Abnormal   Collection Time: 02/28/20 10:19 AM   Specimen: Urine, Random  Result Value Ref Range Status   Specimen Description URINE, RANDOM  Final   Special Requests NONE  Final   Culture (A)  Final    <10,000 COLONIES/mL INSIGNIFICANT GROWTH Performed at [facility], [place] [address] [place], [place] [address]    Report Status 02/29/2020 FINAL  Final         Radiology Studies: DG Pelvis 1-2 Views  Result Date: 02/28/2020 CLINICAL DATA:  Bilateral hip pain for 5 weeks. Patient reports falling sometime in the past week. EXAM: PELVIS - 1-2 VIEW COMPARISON:  09/13/2019 FINDINGS: Right-sided pelvic fractures. There is a fracture obstructing the ileo pubic line, of the medial aspect of the acetabulum. A more subtle fracture is no of the lateral right ilium just above the acetabulum. No other convincing fractures. Hip joints, SI joints and pubic symphysis are normally aligned. No bone lesions.  Skeletal structures are demineralized. Soft tissues are unremarkable. IMPRESSION: 1. Right pelvic fractures, specifically the medial right acetabular wall as well as a subtle fracture just superior to the right acetabulum. Consider further fracture assessment with unenhanced thin-section CT. 2. No other visualized fractures.  No dislocation. Electronically Signed   By: Lajean Manes M.D.   On: 02/28/2020 18:27   DG Chest Port 1 View  Result Date: 02/28/2020 CLINICAL DATA:  Weakness. Additional history provided: Patient reports weakness, back pain, hip pain, leg pain. EXAM: PORTABLE CHEST 1 VIEW COMPARISON:  Prior chest  radiograph 09/13/2019. FINDINGS: Mild cardiomegaly, unchanged. Aortic atherosclerosis. No appreciable airspace consolidation or pulmonary edema. No evidence of pleural effusion or pneumothorax. No acute bony abnormality identified. IMPRESSION: No evidence of acute cardiopulmonary abnormality. Mild cardiomegaly, unchanged. Aortic Atherosclerosis (ICD10-I70.0). Electronically Signed   By: Kellie Simmering DO   On: 02/28/2020 10:22        Scheduled Meds: . Derrill Memo ON 03/01/2020] atorvastatin  20 mg Oral Daily  . calcitonin (salmon)  1 spray Alternating Nares Daily  . diltiazem  240 mg Oral Daily  . donepezil  10 mg Oral QHS  . escitalopram  20 mg Oral Daily  . feeding supplement  237 mL Oral TID BM  . fluticasone furoate-vilanterol  1 puff Inhalation Daily  . furosemide  20 mg Oral QODAY  . lamoTRIgine  100 mg Oral BID  . loratadine  10 mg Oral Daily  . melatonin  10 mg Oral QHS  . memantine  10 mg Oral BID  . rivaroxaban  10 mg Oral Daily  . sodium chloride flush  3 mL Intravenous Q12H  . umeclidinium bromide  1 puff Inhalation Daily   Continuous Infusions: . cefTRIAXone (ROCEPHIN)  IV 1 g (02/29/20 1606)     LOS: 0 days    Time spent: 35 mins    Kathie Dike, MD [facility]   If 7PM-7AM, please contact night-coverage [email or web address]  02/29/2020, 9:10 PM

## 2020-02-29 NOTE — Progress Notes (Signed)
RE:  Megan Munoz       Date of Birth: [birth year]      Date:   02/29/2020       To Whom It May Concern:  Please be advised that the above-named patient has a primary diagnosis of dementia which supersedes any psychiatric diagnosis.

## 2020-02-29 NOTE — Plan of Care (Signed)
  Problem: Health Behavior/Discharge Planning: Goal: Ability to manage health-related needs will improve Outcome: Progressing   Problem: Clinical Measurements: Goal: Ability to maintain clinical measurements within normal limits will improve Outcome: Progressing Goal: Will remain free from infection Outcome: Progressing   

## 2020-02-29 NOTE — NC FL2 (Signed)
[place] LEVEL OF CARE SCREENING TOOL     IDENTIFICATION  Patient Name: Megan Munoz Birthdate: [birth year] Sex: female Admission Date (Current Location): 02/28/2020  [facility] and [place] Number:  Herbalist and Address:  [facility], [place] [address] [place], [place] [address]      Provider Number: [id number]  Attending Physician Name and Address:  Kathie Dike, MD  Relative Name and Phone Number:  Daine Gip [phone]    Current Level of Care: Hospital Recommended Level of Care: [place] Prior Approval Number:    Date Approved/Denied:   PASRR Number:    Discharge Plan: SNF    Current Diagnoses: Patient Active Problem List   Diagnosis Date Noted  . Weakness 02/28/2020  . Urinary tract infection 02/28/2020  . Chronic anticoagulation 02/28/2020  . DNR (do not resuscitate) 02/28/2020  . Cerebrovascular accident (CVA) due to thrombosis of left middle cerebral artery ([place]) 06/20/2019  . Rectal cancer ([place]) 09/21/2018  . Pulmonary hypertension ([place]) 02/07/2018  . Acute on chronic diastolic heart failure ([place]) 02/07/2018  . Respiratory distress   . Encephalomalacia on imaging study 02/03/2018  . COPD (chronic obstructive pulmonary disease) with emphysema ([place]) 02/03/2018  . Gastrointestinal hemorrhage   . CAP (community acquired pneumonia) 02/02/2018  . Tremor 07/21/2017  . Insomnia 07/21/2017  . Dementia without behavioral disturbance ([place]) 07/21/2016  . Gait abnormality 07/21/2016  . Recurrent pneumonia 12/06/2015  . Multiple lung nodules on CT 11/01/2015  . Chronic diastolic CHF (congestive heart failure) ([place]) 10/30/2015  . Bilateral lower extremity edema 08/05/2015  . Lobar pneumonia due to unspecified organism 07/30/2015  . Malnutrition of moderate degree 07/29/2015  . Hyponatremia 07/25/2015  . Symptomatic anemia 07/25/2015  . Acute respiratory failure with hypoxia ([place]) 07/24/2015  .  Essential hypertension 07/22/2014  . Permanent atrial fibrillation ([place]): CHA2DS2-VASc Score 7 - on Xarelto 07/07/2014  . Hyperlipidemia 07/07/2014  . H/O: stroke 10/29/2008    Orientation RESPIRATION BLADDER Height & Weight     Self, Time, Situation, Place  Normal Incontinent Weight: 59 kg Height:  5\' 6"  (167.6 cm)  BEHAVIORAL SYMPTOMS/MOOD NEUROLOGICAL BOWEL NUTRITION STATUS      Continent Diet  AMBULATORY STATUS COMMUNICATION OF NEEDS Skin   Limited Assist Verbally Normal                       Personal Care Assistance Level of Assistance  Bathing, Feeding, Dressing Bathing Assistance: Limited assistance Feeding assistance: Limited assistance Dressing Assistance: Limited assistance     Functional Limitations Info             SPECIAL CARE FACTORS FREQUENCY  PT (By licensed PT), OT (By licensed OT)     PT Frequency: five times a week OT Frequency: five times a week            Contractures Contractures Info: Not present    Additional Factors Info  Code Status, Allergies, Psychotropic Code Status Info: DNR Allergies Info: Topiramate , Tetanus toxoids Psychotropic Info: Aricept 10 mg daily at bedtime, Lexapro 20 mg daily , Lamictal 100 mg BID, Namenda 10 mg daily         Current Medications (02/29/2020):  This is the current hospital active medication list Current Facility-Administered Medications  Medication Dose Route Frequency Provider Last Rate Last Admin  . acetaminophen (TYLENOL) tablet 650 mg  650 mg Oral Q6H PRN Fuller Plan A, MD   650 mg at 02/29/20  0867   Or  . acetaminophen (TYLENOL) suppository 650 mg  650 mg Rectal Q6H PRN Tamala Julian, Rondell A, MD      . albuterol (PROVENTIL) (2.5 MG/3ML) 0.083% nebulizer solution 2.5 mg  2.5 mg Nebulization Q6H PRN Fuller Plan A, MD      . Derrill Memo ON 03/01/2020] atorvastatin (LIPITOR) tablet 20 mg  20 mg Oral Daily Memon, Jolaine Artist, MD      . calcitonin (salmon) (MIACALCIN/FORTICAL) nasal spray 1 spray  1  spray Alternating Nares Daily Fuller Plan A, MD   1 spray at 02/29/20 0928  . cefTRIAXone (ROCEPHIN) 1 g in sodium chloride 0.9 % 100 mL IVPB  1 g Intravenous Q24H Norval Morton, MD   Stopped at 02/28/20 1900  . diltiazem (CARDIZEM CD) 24 hr capsule 240 mg  240 mg Oral Daily Fuller Plan A, MD   240 mg at 02/29/20 0927  . donepezil (ARICEPT) tablet 10 mg  10 mg Oral QHS Smith, Rondell A, MD   10 mg at 02/28/20 2134  . escitalopram (LEXAPRO) tablet 20 mg  20 mg Oral Daily Fuller Plan A, MD   20 mg at 02/29/20 0927  . feeding supplement (ENSURE ENLIVE / ENSURE PLUS) liquid 237 mL  237 mL Oral TID BM Smith, Rondell A, MD   237 mL at 02/29/20 0928  . fluticasone furoate-vilanterol (BREO ELLIPTA) 200-25 MCG/INH 1 puff  1 puff Inhalation Daily Smith, Rondell A, MD      . furosemide (LASIX) tablet 20 mg  20 mg Oral QODAY Smith, Rondell A, MD   20 mg at 02/28/20 1749  . lamoTRIgine (LAMICTAL) tablet 100 mg  100 mg Oral BID Fuller Plan A, MD   100 mg at 02/29/20 0927  . loratadine (CLARITIN) tablet 10 mg  10 mg Oral Daily Fuller Plan A, MD   10 mg at 02/29/20 0927  . melatonin tablet 10 mg  10 mg Oral QHS Fuller Plan A, MD   10 mg at 02/28/20 2135  . memantine (NAMENDA) tablet 10 mg  10 mg Oral BID Fuller Plan A, MD   10 mg at 02/29/20 0927  . rivaroxaban (XARELTO) tablet 10 mg  10 mg Oral Daily Fuller Plan A, MD   10 mg at 02/29/20 0926  . sodium chloride flush (NS) 0.9 % injection 3 mL  3 mL Intravenous Q12H Smith, Rondell A, MD   3 mL at 02/29/20 0928  . traMADol (ULTRAM) tablet 50 mg  50 mg Oral Q6H PRN Fuller Plan A, MD   50 mg at 02/29/20 0415  . umeclidinium bromide (INCRUSE ELLIPTA) 62.5 MCG/INH 1 puff  1 puff Inhalation Daily Norval Morton, MD   1 puff at 02/29/20 0907     Discharge Medications: Please see discharge summary for a list of discharge medications.  Relevant Imaging Results:  Relevant Lab Results:   Additional Information SSI [phone], HX  recent pelvic fracture WBAT, Pfizer vaccine covid 05/04/19 and 05/29/19 , uses walker  Arianis Bowditch, Edson Snowball, RN

## 2020-02-29 NOTE — Plan of Care (Signed)
  Problem: Health Behavior/Discharge Planning: Goal: Ability to manage health-related needs will improve Outcome: Progressing   

## 2020-03-01 MED ORDER — INFLUENZA VAC A&B SA ADJ QUAD 0.5 ML IM PRSY
0.5000 mL | PREFILLED_SYRINGE | INTRAMUSCULAR | Status: AC
  Administered 2020-03-02: 0.5 mL via INTRAMUSCULAR
  Filled 2020-03-01: qty 0.5

## 2020-03-01 NOTE — TOC Progression Note (Signed)
Transition of Care [facility]) - Progression Note    Patient Details  Name: [name] MRN: [id number] Date of Birth: January 16, 1933  [facility]) CM/SW Contact  Jacalyn Lefevre Edson Snowball, RN Phone Number: 03/01/2020, 1:41 PM  Clinical Narrative:     Provided SNF bed offers to patient at bedside. Also called daughter [name] and emailed offers including Medicare.gov ratings to [place] at [email or web address]  [employer] has my direct cell number and will call back with a decision.   Patient's son in law took patient's clothes home with him. Patient aware.   Expected Discharge Plan: [place]    Expected Discharge Plan and Services Expected Discharge Plan: [place]   Discharge Planning Services: CM Consult Post Acute Care Choice: [place] Living arrangements for the past 2 months: Single Family Home                 DME Arranged: N/A DME Agency: NA       HH Arranged: NA           Social Determinants of Health (SDOH) Interventions    Readmission Risk Interventions No flowsheet data found.

## 2020-03-01 NOTE — Progress Notes (Signed)
PROGRESS NOTE    Megan Munoz  TIR:[id number] DOB: 07-14-1932 DOA: 02/28/2020 PCP: Fanny Bien, MD    Brief Narrative:  86 year old female, who resides at home with her daughter, brought to the hospital with increasing weakness, confusion.  She has known right-sided pelvic fracture.  She was noted to be dehydrated in rapid atrial fib.  Patient was started on IV fluids.  Overall rate control has improved.  Dehydration has improved as has mental status.  Seen by physical therapy with recommendations for skilled nursing facility placement  Assessment & Plan:   Principal Problem:   Urinary tract infection Active Problems:   Permanent atrial fibrillation ([place]): CHA2DS2-VASc Score 7 - on Xarelto   Chronic diastolic CHF (congestive heart failure) (HCC)   Dementia without behavioral disturbance (HCC)   Weakness   Chronic anticoagulation   DNR (do not resuscitate)   UTI (urinary tract infection)   Weakness and increased confusion -Likely secondary to dehydration -Initial concerns for urinary tract infection, although urine culture did not show any significant growth -She was started on ceftriaxone, but with negative urine culture, will discontinue further antibiotics -Overall mental status has improved and appears to be approaching baseline  Pelvic fracture -Noted to have right pelvic fracture around acetabulum -Management will be nonoperative -Continue physical therapy -Weightbearing as tolerated -Continue pain management  Persistent atrial fibrillation -Heart rate currently stable on Cardizem -She is anticoagulated with Xarelto  COPD -No shortness of breath or wheezing at this time -Continue on Trelegy and bronchodilators  Chronic diastolic congestive heart failure -Appears to be compensated -Continue on home dose of Lasix  Hyperlipidemia -Continue statin -Simvastatin changed to atorvastatin due to high risk of rhabdomyolysis with simvastatin and concurrent  diltiazem   DVT prophylaxis: rivaroxaban (XARELTO) tablet 10 mg Start: 02/28/20 1645 rivaroxaban (XARELTO) tablet 10 mg  Code Status: DNR Family Communication: discussed with daughter over the phone Disposition Plan: Status is: Inpatient  Remains inpatient appropriate because:Unsafe d/c plan   Dispo: The patient is from: Home              Anticipated d/c is to: SNF              Anticipated d/c date is: 1 day              Patient currently is medically stable to d/c.   Consultants:     Procedures:     Antimicrobials:   Ceftriaxone 12/1>12/2    Subjective: No new complaints, feels pain is controlled.  Objective: Vitals:   02/29/20 2053 03/01/20 0542 03/01/20 0849 03/01/20 1538  BP: 123/60 126/61  (!) 118/54  Pulse: 69 66  61  Resp: 18 17  19   Temp: 99.3 F (37.4 C) 98.6 F (37 C)  98.2 F (36.8 C)  TempSrc: Oral Oral    SpO2: 98% 92% 94% 96%  Weight:      Height:       No intake or output data in the 24 hours ending 03/01/20 1946 Filed Weights   02/28/20 0940  Weight: 59 kg    Examination:  General exam: Alert, awake, oriented x 3 Respiratory system: Clear to auscultation. Respiratory effort normal. Cardiovascular system:RRR. No murmurs, rubs, gallops. Gastrointestinal system: Abdomen is nondistended, soft and nontender. No organomegaly or masses felt. Normal bowel sounds heard. Central nervous system: Alert and oriented. No focal neurological deficits. Resting tremor in hands bilaterally Extremities: No C/C/E, +pedal pulses Skin: No rashes, lesions or ulcers Psychiatry: Judgement and insight appear normal. Mood &  affect appropriate.      Data Reviewed: I have personally reviewed following labs and imaging studies  CBC: Recent Labs  Lab 02/28/20 1019 02/29/20 0420  WBC 6.4 5.5  NEUTROABS 5.2  --   HGB 10.9* 10.1*  HCT 35.9* 32.1*  MCV 100.6* 98.5  PLT 234 716   Basic Metabolic Panel: Recent Labs  Lab 02/28/20 1019 02/29/20 0420  NA  142 137  K 3.9 3.4*  CL 105 105  CO2 24 22  GLUCOSE 79 102*  BUN 21 15  CREATININE 0.68 0.71  CALCIUM 9.4 8.7*  MG  --  1.8   GFR: Estimated Creatinine Clearance: 47.9 mL/min (by C-G formula based on SCr of 0.71 mg/dL). Liver Function Tests: Recent Labs  Lab 02/28/20 1019  AST 27  ALT 14  ALKPHOS 77  BILITOT 0.9  PROT 6.4*  ALBUMIN 3.5   No results for input(s): LIPASE, AMYLASE in the last 168 hours. No results for input(s): AMMONIA in the last 168 hours. Coagulation Profile: No results for input(s): INR, PROTIME in the last 168 hours. Cardiac Enzymes: Recent Labs  Lab 02/28/20 1019  CKTOTAL 46   BNP (last 3 results) No results for input(s): PROBNP in the last 8760 hours. HbA1C: No results for input(s): HGBA1C in the last 72 hours. CBG: No results for input(s): GLUCAP in the last 168 hours. Lipid Profile: No results for input(s): CHOL, HDL, LDLCALC, TRIG, CHOLHDL, LDLDIRECT in the last 72 hours. Thyroid Function Tests: No results for input(s): TSH, T4TOTAL, FREET4, T3FREE, THYROIDAB in the last 72 hours. Anemia Panel: No results for input(s): VITAMINB12, FOLATE, FERRITIN, TIBC, IRON, RETICCTPCT in the last 72 hours. Sepsis Labs: No results for input(s): PROCALCITON, LATICACIDVEN in the last 168 hours.  Recent Results (from the past 240 hour(s))  Resp Panel by RT-PCR (Flu A&B, Covid) Nasopharyngeal Swab     Status: None   Collection Time: 02/28/20 10:19 AM   Specimen: Nasopharyngeal Swab; Nasopharyngeal(NP) swabs in vial transport medium  Result Value Ref Range Status   SARS Coronavirus 2 by RT PCR NEGATIVE NEGATIVE Final    Comment: (NOTE) SARS-CoV-2 target nucleic acids are NOT DETECTED.  The SARS-CoV-2 RNA is generally detectable in upper respiratory specimens during the acute phase of infection. The lowest concentration of SARS-CoV-2 viral copies this assay can detect is 138 copies/mL. A negative result does not preclude SARS-Cov-2 infection and should  not be used as the sole basis for treatment or other patient management decisions. A negative result may occur with  improper specimen collection/handling, submission of specimen other than nasopharyngeal swab, presence of viral mutation(s) within the areas targeted by this assay, and inadequate number of viral copies(<138 copies/mL). A negative result must be combined with clinical observations, patient history, and epidemiological information. The expected result is Negative.  Fact Sheet for Patients:  EntrepreneurPulse.com.au  Fact Sheet for Healthcare Providers:  IncredibleEmployment.be  This test is no t yet approved or cleared by the [place] FDA and  has been authorized for detection and/or diagnosis of SARS-CoV-2 by FDA under an Emergency Use Authorization (EUA). This EUA will remain  in effect (meaning this test can be used) for the duration of the COVID-19 declaration under Section 564(b)(1) of the Act, 21 U.S.C.section 360bbb-3(b)(1), unless the authorization is terminated  or revoked sooner.       Influenza A by PCR NEGATIVE NEGATIVE Final   Influenza B by PCR NEGATIVE NEGATIVE Final    Comment: (NOTE) The Xpert Xpress SARS-CoV-2/FLU/RSV plus assay  is intended as an aid in the diagnosis of influenza from Nasopharyngeal swab specimens and should not be used as a sole basis for treatment. Nasal washings and aspirates are unacceptable for Xpert Xpress SARS-CoV-2/FLU/RSV testing.  Fact Sheet for Patients: EntrepreneurPulse.com.au  Fact Sheet for Healthcare Providers: IncredibleEmployment.be  This test is not yet approved or cleared by the [place] FDA and has been authorized for detection and/or diagnosis of SARS-CoV-2 by FDA under an Emergency Use Authorization (EUA). This EUA will remain in effect (meaning this test can be used) for the duration of the COVID-19 declaration under  Section 564(b)(1) of the Act, 21 U.S.C. section 360bbb-3(b)(1), unless the authorization is terminated or revoked.  Performed at [facility], [place] [address] [place], [place] [address]   Culture, Urine     Status: Abnormal   Collection Time: 02/28/20 10:19 AM   Specimen: Urine, Random  Result Value Ref Range Status   Specimen Description URINE, RANDOM  Final   Special Requests NONE  Final   Culture (A)  Final    <10,000 COLONIES/mL INSIGNIFICANT GROWTH Performed at [facility], [place] [address], [place], [place] [address]    Report Status 02/29/2020 FINAL  Final         Radiology Studies: No results found.      Scheduled Meds:  atorvastatin  20 mg Oral Daily   calcitonin (salmon)  1 spray Alternating Nares Daily   diltiazem  240 mg Oral Daily   donepezil  10 mg Oral QHS   escitalopram  20 mg Oral Daily   feeding supplement  237 mL Oral TID BM   fluticasone furoate-vilanterol  1 puff Inhalation Daily   furosemide  20 mg Oral QODAY   [START ON 03/02/2020] influenza vaccine adjuvanted  0.5 mL Intramuscular Tomorrow-1000   lamoTRIgine  100 mg Oral BID   loratadine  10 mg Oral Daily   melatonin  10 mg Oral QHS   memantine  10 mg Oral BID   rivaroxaban  10 mg Oral Daily   sodium chloride flush  3 mL Intravenous Q12H   umeclidinium bromide  1 puff Inhalation Daily   Continuous Infusions:    LOS: 1 day    Time spent: 35 mins    Kathie Dike, MD [facility]   If 7PM-7AM, please contact night-coverage [email or web address]  03/01/2020, 7:46 PM

## 2020-03-01 NOTE — Plan of Care (Signed)
  Problem: Health Behavior/Discharge Planning: Goal: Ability to manage health-related needs will improve Outcome: Progressing   

## 2020-03-02 ENCOUNTER — Encounter (HOSPITAL_COMMUNITY): Payer: Self-pay | Admitting: Internal Medicine

## 2020-03-02 NOTE — TOC Progression Note (Signed)
Transition of Care [facility]) - Progression Note    Patient Details  Name: Megan Munoz MRN: [id number] Date of Birth: [birth year]  Transition of Care [facility]) CM/SW [place],  Phone Number: [phone] 03/02/2020, 4:37 PM  Clinical Narrative:     CSW spoke with patient's daughter [name] about a bed choice. [name] explained that she attempted to call Janie at [facility] to receive more information however had to leave message. CSW called the admission's number and spoke with someone and they took down Kimberly's number. He informed CSW that he would have [place] call [place] tomorrow.  CSW will follow up with [name] tomorrow for bed choice.  TOC team will continue to assist with discharge planning needs.   Expected Discharge Plan: [place]    Expected Discharge Plan and Services Expected Discharge Plan: [place]   Discharge Planning Services: CM Consult Post Acute Care Choice: [place] Living arrangements for the past 2 months: Single Family Home                 DME Arranged: N/A DME Agency: NA       HH Arranged: NA           Social Determinants of Health (SDOH) Interventions    Readmission Risk Interventions No flowsheet data found.

## 2020-03-02 NOTE — Plan of Care (Signed)

## 2020-03-02 NOTE — Progress Notes (Addendum)
PROGRESS NOTE    Megan Munoz  LKT:[id number] DOB: 06-12-1932 DOA: 02/28/2020 PCP: Fanny Bien, MD    Brief Narrative:  86 year old female, who resides at home with her daughter, brought to the hospital with increasing weakness, confusion.  She has known right-sided pelvic fracture.  She was noted to be dehydrated in rapid atrial fib.  Patient was started on IV fluids.  Overall rate control has improved.  Dehydration has improved as has mental status.  Seen by physical therapy with recommendations for skilled nursing facility placement  Assessment & Plan:   Principal Problem:   Urinary tract infection Active Problems:   Permanent atrial fibrillation ([facility]): CHA2DS2-VASc Score 7 - on Xarelto   Chronic diastolic CHF (congestive heart failure) (HCC)   Dementia without behavioral disturbance (HCC)   Weakness   Chronic anticoagulation   DNR (do not resuscitate)   UTI (urinary tract infection)   Weakness and increased confusion -Likely secondary to dehydration -Initial concerns for urinary tract infection, although urine culture did not show any significant growth -She was started on ceftriaxone, but with negative urine culture, will discontinue further antibiotics -Overall mental status has improved and appears to be approaching baseline  Pelvic fracture -Noted to have right pelvic fracture around acetabulum -Management will be nonoperative -Continue physical therapy -Weightbearing as tolerated -Continue pain management  Persistent atrial fibrillation -Heart rate currently stable on Cardizem -She is anticoagulated with Xarelto  COPD -No shortness of breath or wheezing at this time -Continue on Trelegy and bronchodilators  Chronic diastolic congestive heart failure -Appears to be compensated -Continue on home dose of Lasix  Hyperlipidemia -Continue statin -Simvastatin changed to atorvastatin due to high risk of rhabdomyolysis with simvastatin and concurrent  diltiazem  Normocytic anemia -Stable -Check anemia panel   DVT prophylaxis: rivaroxaban (XARELTO) tablet 10 mg Start: 02/28/20 1645 rivaroxaban (XARELTO) tablet 10 mg  Code Status: DNR Family Communication: left VM for daughter on 12/4 Disposition Plan: Status is: Inpatient  Remains inpatient appropriate because:Unsafe d/c plan   Dispo: The patient is from: Home              Anticipated d/c is to: SNF              Anticipated d/c date is: 1 day              Patient currently is medically stable to d/c.   Consultants:     Procedures:     Antimicrobials:   Ceftriaxone 12/1>12/2    Subjective: Complains of some pain in right hip, but manageable.  Objective: Vitals:   03/01/20 1538 03/01/20 1950 03/02/20 0433 03/02/20 1433  BP: (!) 118/54 127/64 (!) 129/51 115/60  Pulse: 61 62 66 82  Resp: 19 19 17 18   Temp: 98.2 F (36.8 C) 98.4 F (36.9 C) 98.9 F (37.2 C) 97.8 F (36.6 C)  TempSrc:  Oral Oral Oral  SpO2: 96% 97% 95% 98%  Weight:      Height:        Intake/Output Summary (Last 24 hours) at 03/02/2020 1551 Last data filed at 03/02/2020 1400 Gross per 24 hour  Intake 610 ml  Output --  Net 610 ml   Filed Weights   02/28/20 0940  Weight: 59 kg    Examination:  General exam: Alert, awake, oriented x 3 Respiratory system: Clear to auscultation. Respiratory effort normal. Cardiovascular system:RRR. No murmurs, rubs, gallops. Gastrointestinal system: Abdomen is nondistended, soft and nontender. No organomegaly or masses felt. Normal bowel sounds heard.  Central nervous system: Alert and oriented. No focal neurological deficits. Extremities: No C/C/E, +pedal pulses Skin: No rashes, lesions or ulcers Psychiatry: Judgement and insight appear normal. Mood & affect appropriate.    Data Reviewed: I have personally reviewed following labs and imaging studies  CBC: Recent Labs  Lab 02/28/20 1019 02/29/20 0420  WBC 6.4 5.5  NEUTROABS 5.2  --   HGB  10.9* 10.1*  HCT 35.9* 32.1*  MCV 100.6* 98.5  PLT 234 009   Basic Metabolic Panel: Recent Labs  Lab 02/28/20 1019 02/29/20 0420  NA 142 137  K 3.9 3.4*  CL 105 105  CO2 24 22  GLUCOSE 79 102*  BUN 21 15  CREATININE 0.68 0.71  CALCIUM 9.4 8.7*  MG  --  1.8   GFR: Estimated Creatinine Clearance: 47.9 mL/min (by C-G formula based on SCr of 0.71 mg/dL). Liver Function Tests: Recent Labs  Lab 02/28/20 1019  AST 27  ALT 14  ALKPHOS 77  BILITOT 0.9  PROT 6.4*  ALBUMIN 3.5   No results for input(s): LIPASE, AMYLASE in the last 168 hours. No results for input(s): AMMONIA in the last 168 hours. Coagulation Profile: No results for input(s): INR, PROTIME in the last 168 hours. Cardiac Enzymes: Recent Labs  Lab 02/28/20 1019  CKTOTAL 46   BNP (last 3 results) No results for input(s): PROBNP in the last 8760 hours. HbA1C: No results for input(s): HGBA1C in the last 72 hours. CBG: No results for input(s): GLUCAP in the last 168 hours. Lipid Profile: No results for input(s): CHOL, HDL, LDLCALC, TRIG, CHOLHDL, LDLDIRECT in the last 72 hours. Thyroid Function Tests: No results for input(s): TSH, T4TOTAL, FREET4, T3FREE, THYROIDAB in the last 72 hours. Anemia Panel: No results for input(s): VITAMINB12, FOLATE, FERRITIN, TIBC, IRON, RETICCTPCT in the last 72 hours. Sepsis Labs: No results for input(s): PROCALCITON, LATICACIDVEN in the last 168 hours.  Recent Results (from the past 240 hour(s))  Resp Panel by RT-PCR (Flu A&B, Covid) Nasopharyngeal Swab     Status: None   Collection Time: 02/28/20 10:19 AM   Specimen: Nasopharyngeal Swab; Nasopharyngeal(NP) swabs in vial transport medium  Result Value Ref Range Status   SARS Coronavirus 2 by RT PCR NEGATIVE NEGATIVE Final    Comment: (NOTE) SARS-CoV-2 target nucleic acids are NOT DETECTED.  The SARS-CoV-2 RNA is generally detectable in upper respiratory specimens during the acute phase of infection. The  lowest concentration of SARS-CoV-2 viral copies this assay can detect is 138 copies/mL. A negative result does not preclude SARS-Cov-2 infection and should not be used as the sole basis for treatment or other patient management decisions. A negative result may occur with  improper specimen collection/handling, submission of specimen other than nasopharyngeal swab, presence of viral mutation(s) within the areas targeted by this assay, and inadequate number of viral copies(<138 copies/mL). A negative result must be combined with clinical observations, patient history, and epidemiological information. The expected result is Negative.  Fact Sheet for Patients:  EntrepreneurPulse.com.au  Fact Sheet for Healthcare Providers:  IncredibleEmployment.be  This test is no t yet approved or cleared by the [place] FDA and  has been authorized for detection and/or diagnosis of SARS-CoV-2 by FDA under an Emergency Use Authorization (EUA). This EUA will remain  in effect (meaning this test can be used) for the duration of the COVID-19 declaration under Section 564(b)(1) of the Act, 21 U.S.C.section 360bbb-3(b)(1), unless the authorization is terminated  or revoked sooner.  Influenza A by PCR NEGATIVE NEGATIVE Final   Influenza B by PCR NEGATIVE NEGATIVE Final    Comment: (NOTE) The Xpert Xpress SARS-CoV-2/FLU/RSV plus assay is intended as an aid in the diagnosis of influenza from Nasopharyngeal swab specimens and should not be used as a sole basis for treatment. Nasal washings and aspirates are unacceptable for Xpert Xpress SARS-CoV-2/FLU/RSV testing.  Fact Sheet for Patients: EntrepreneurPulse.com.au  Fact Sheet for Healthcare Providers: IncredibleEmployment.be  This test is not yet approved or cleared by the [place] FDA and has been authorized for detection and/or diagnosis of SARS-CoV-2 by FDA under  an Emergency Use Authorization (EUA). This EUA will remain in effect (meaning this test can be used) for the duration of the COVID-19 declaration under Section 564(b)(1) of the Act, 21 U.S.C. section 360bbb-3(b)(1), unless the authorization is terminated or revoked.  Performed at [facility], [place] [address] [place], [place] [address]   Culture, Urine     Status: Abnormal   Collection Time: 02/28/20 10:19 AM   Specimen: Urine, Random  Result Value Ref Range Status   Specimen Description URINE, RANDOM  Final   Special Requests NONE  Final   Culture (A)  Final    <10,000 COLONIES/mL INSIGNIFICANT GROWTH Performed at [facility], [place] [address], [place], [place] [address]    Report Status 02/29/2020 FINAL  Final         Radiology Studies: No results found.      Scheduled Meds: . atorvastatin  20 mg Oral Daily  . calcitonin (salmon)  1 spray Alternating Nares Daily  . diltiazem  240 mg Oral Daily  . donepezil  10 mg Oral QHS  . escitalopram  20 mg Oral Daily  . feeding supplement  237 mL Oral TID BM  . fluticasone furoate-vilanterol  1 puff Inhalation Daily  . furosemide  20 mg Oral QODAY  . lamoTRIgine  100 mg Oral BID  . loratadine  10 mg Oral Daily  . melatonin  10 mg Oral QHS  . memantine  10 mg Oral BID  . rivaroxaban  10 mg Oral Daily  . sodium chloride flush  3 mL Intravenous Q12H  . umeclidinium bromide  1 puff Inhalation Daily   Continuous Infusions:    LOS: 2 days    Time spent: 35 mins    Kathie Dike, MD [facility]   If 7PM-7AM, please contact night-coverage [email or web address]  03/02/2020, 3:51 PM

## 2020-03-03 DIAGNOSIS — S32475D Nondisplaced fracture of medial wall of left acetabulum, subsequent encounter for fracture with routine healing: Secondary | ICD-10-CM

## 2020-03-03 LAB — VITAMIN B12: Vitamin B-12: 726 pg/mL (ref 180–914)

## 2020-03-03 LAB — BASIC METABOLIC PANEL
Anion gap: 10 (ref 5–15)
BUN: 22 mg/dL (ref 8–23)
CO2: 25 mmol/L (ref 22–32)
Calcium: 9.1 mg/dL (ref 8.9–10.3)
Chloride: 104 mmol/L (ref 98–111)
Creatinine, Ser: 0.56 mg/dL (ref 0.44–1.00)
GFR, Estimated: >60 mL/min (ref >60)
Glucose, Bld: 103 mg/dL — ABNORMAL HIGH (ref 70–99)
Potassium: 4.4 mmol/L (ref 3.5–5.1)
Sodium: 139 mmol/L (ref 135–145)

## 2020-03-03 LAB — CBC
HCT: 30.3 % — ABNORMAL LOW (ref 36.0–46.0)
Hemoglobin: 9.9 g/dL — ABNORMAL LOW (ref 12.0–15.0)
MCH: 31.6 pg (ref 26.0–34.0)
MCHC: 32.7 g/dL (ref 30.0–36.0)
MCV: 96.8 fL (ref 80.0–100.0)
Platelets: 247 10*3/uL (ref 150–400)
RBC: 3.13 MIL/uL — ABNORMAL LOW (ref 3.87–5.11)
RDW: 13.6 % (ref 11.5–15.5)
WBC: 4 10*3/uL (ref 4.0–10.5)
nRBC: 0 % (ref 0.0–0.2)

## 2020-03-03 LAB — IRON AND TIBC
Iron: 22 ug/dL — ABNORMAL LOW (ref 28–170)
Saturation Ratios: 7 % — ABNORMAL LOW (ref 10.4–31.8)
TIBC: 312 ug/dL (ref 250–450)
UIBC: 290 ug/dL

## 2020-03-03 LAB — FERRITIN: Ferritin: 85 ng/mL (ref 11–307)

## 2020-03-03 LAB — RETICULOCYTES
Immature Retic Fract: 14.9 % (ref 2.3–15.9)
RBC.: 3.26 MIL/uL — ABNORMAL LOW (ref 3.87–5.11)
Retic Count, Absolute: 56.1 10*3/uL (ref 19.0–186.0)
Retic Ct Pct: 1.7 % (ref 0.4–3.1)

## 2020-03-03 LAB — FOLATE: Folate: 26.3 ng/mL (ref >5.9)

## 2020-03-03 MED ORDER — SODIUM CHLORIDE 0.9 % IV SOLN
510.0000 mg | Freq: Once | INTRAVENOUS | Status: AC
  Administered 2020-03-03: 510 mg via INTRAVENOUS
  Filled 2020-03-03: qty 17

## 2020-03-03 NOTE — Plan of Care (Signed)
Problem: Education: Goal: Knowledge of General Education information will improve Description: Including pain rating scale, medication(s)/side effects and non-pharmacologic comfort measures Outcome: Progressing   Problem: Health Behavior/Discharge Planning: Goal: Ability to manage health-related needs will improve Outcome: Progressing   Problem: Clinical Measurements: Goal: Ability to maintain clinical measurements within normal limits will improve Outcome: Progressing Goal: Respiratory complications will improve Outcome: Progressing   Problem: Activity: Goal: Risk for activity intolerance will decrease Outcome: Progressing   Problem: Nutrition: Goal: Adequate nutrition will be maintained Outcome: Progressing   Problem: Coping: Goal: Level of anxiety will decrease Outcome: Progressing   Problem: Elimination: Goal: Will not experience complications related to bowel motility Outcome: Progressing Goal: Will not experience complications related to urinary retention Outcome: Progressing   Problem: Pain Managment: Goal: General experience of comfort will improve Outcome: Progressing   Problem: Safety: Goal: Ability to remain free from injury will improve Outcome: Progressing   Problem: Skin Integrity: Goal: Risk for impaired skin integrity will decrease Outcome: Progressing   

## 2020-03-03 NOTE — Progress Notes (Signed)
PROGRESS NOTE  Shital Crayton JHE:[id number] DOB: 11-Aug-1932   PCP: Fanny Bien, MD  Patient is from: Home  DOA: 02/28/2020 LOS: 3  Chief complaints: Weakness and altered mental status  Brief Narrative / Interim history: 86 year old F with history of right acetabular fracture, dementia, COPD, persistent A. fib, and diastolic CHF brought to ED with increased weakness and confusion found to be in A. fib with RVR in the setting of dehydration.  Mental status and RVR resolved with hydration.  Therapy recommended SNF.   Subjective: Seen and examined earlier this morning.  No major events overnight of this morning.  No complaints other than right leg pain which is chronic.  Denies chest pain, dyspnea, GI or UTI symptoms.  Objective: Vitals:   03/02/20 1433 03/03/20 0437 03/03/20 0812 03/03/20 1510  BP: 115/60 127/72  116/67  Pulse: 82 77  72  Resp: 18 16  17   Temp: 97.8 F (36.6 C) 97.9 F (36.6 C)  98.1 F (36.7 C)  TempSrc: Oral   Oral  SpO2: 98% 95% 95% 97%  Weight:      Height:        Intake/Output Summary (Last 24 hours) at 03/03/2020 1531 Last data filed at 03/03/2020 1856 Gross per 24 hour  Intake 543 ml  Output 500 ml  Net 43 ml   Filed Weights   02/28/20 0940  Weight: 59 kg    Examination:  GENERAL: No apparent distress.  Nontoxic. HEENT: MMM.  Vision and hearing grossly intact.  NECK: Supple.  No apparent JVD.  RESP:  No IWOB.  Fair aeration bilaterally. CVS:  RRR. Heart sounds normal.  ABD/GI/GU: BS+. Abd soft, NTND.  MSK/EXT:  Moves extremities. No apparent deformity. No edema.  SKIN: no apparent skin lesion or wound NEURO: Awake, alert and oriented appropriately.  No apparent focal neuro deficit other than BLE weakness. PSYCH: Calm. Normal affect.   Procedures:  None  Microbiology summarized: [id number] and influenza PCR nonreactive.  Assessment & Plan: Generalized weakness and acute metabolic encephalopathy in the setting of dehydration: UTI  ruled out.  Antibiotic discontinued.  Urine culture was negative.  Encephalopathy resolved.   -Therapy recommended SNF given significant weakness  Subacute right pelvic involving the medial aspect of right acetabulum: POA.  Initially diagnosed 5 weeks prior to admission.  She was WBAT -Continue conservative care with WBAT and physical therapy -Fall precautions -Outpatient follow-up with orthopedic surgery  Persistent atrial fibrillation: Rate controlled.  CHA2DS2-VASc >5 -Continue Cardizem for rate control. -On Xarelto-which could be risky given her risk of fall  Chronic COPD stable -Continue Trelegy Ellipta and bronchodilators  Chronic diastolic congestive heart failure: TTE in 2019 with EF of 50 to 63%, diastolic impairment, moderate MVR, moderate TVR,  moderate LAE and severe RAE and PA PP of 49.  Appears compensated and euvolemic. -Continue on home dose of Lasix -Monitor fluid status  History of left MCA CVA: Stable -Home medications  Iron deficiency anemia: Iron saturation 7% with normal TIBC and ferritin.  H&H stable. -IV Feraheme x1  Hyperlipidemia -Continue statin -Simvastatin changed to atorvastatin due to high risk of rhabdo with simvastatin and concurrent diltiazem  Dementia without behavioral disturbance: Fairly oriented. -Home medications -Reorientation and delirium precautions  Body mass index is 20.99 kg/m.         DVT prophylaxis:  rivaroxaban (XARELTO) tablet 10 mg Start: 02/28/20 1645 rivaroxaban (XARELTO) tablet 10 mg  Code Status: DNR/DNI Family Communication: Patient and/or RN. Available if any question.  Status is:  Inpatient  Remains inpatient appropriate because:Unsafe d/c plan   Dispo: The patient is from: Home              Anticipated d/c is to: SNF              Anticipated d/c date is: 1 day              Patient currently is medically stable to d/c.       Consultants:  None   Sch Meds:  Scheduled Meds: . atorvastatin  20  mg Oral Daily  . calcitonin (salmon)  1 spray Alternating Nares Daily  . diltiazem  240 mg Oral Daily  . donepezil  10 mg Oral QHS  . escitalopram  20 mg Oral Daily  . feeding supplement  237 mL Oral TID BM  . fluticasone furoate-vilanterol  1 puff Inhalation Daily  . furosemide  20 mg Oral QODAY  . lamoTRIgine  100 mg Oral BID  . loratadine  10 mg Oral Daily  . melatonin  10 mg Oral QHS  . memantine  10 mg Oral BID  . rivaroxaban  10 mg Oral Daily  . sodium chloride flush  3 mL Intravenous Q12H  . umeclidinium bromide  1 puff Inhalation Daily   Continuous Infusions: PRN Meds:.acetaminophen **OR** acetaminophen, albuterol, traMADol  Antimicrobials: Anti-infectives (From admission, onward)   Start     Dose/Rate Route Frequency Ordered Stop   02/28/20 1715  cefTRIAXone (ROCEPHIN) 1 g in sodium chloride 0.9 % 100 mL IVPB  Status:  Discontinued        1 g 200 mL/hr over 30 Minutes Intravenous Every 24 hours 02/28/20 1701 03/01/20 1638       I have personally reviewed the following labs and images: CBC: Recent Labs  Lab 02/28/20 1019 02/29/20 0420 03/03/20 0253  WBC 6.4 5.5 4.0  NEUTROABS 5.2  --   --   HGB 10.9* 10.1* 9.9*  HCT 35.9* 32.1* 30.3*  MCV 100.6* 98.5 96.8  PLT 234 224 247   BMP &GFR Recent Labs  Lab 02/28/20 1019 02/29/20 0420 03/03/20 0253  NA 142 137 139  K 3.9 3.4* 4.4  CL 105 105 104  CO2 24 22 25   GLUCOSE 79 102* 103*  BUN 21 15 22   CREATININE 0.68 0.71 0.56  CALCIUM 9.4 8.7* 9.1  MG  --  1.8  --    Estimated Creatinine Clearance: 47.9 mL/min (by C-G formula based on SCr of 0.56 mg/dL). Liver & Pancreas: Recent Labs  Lab 02/28/20 1019  AST 27  ALT 14  ALKPHOS 77  BILITOT 0.9  PROT 6.4*  ALBUMIN 3.5   No results for input(s): LIPASE, AMYLASE in the last 168 hours. No results for input(s): AMMONIA in the last 168 hours. Diabetic: No results for input(s): HGBA1C in the last 72 hours. No results for input(s): GLUCAP in the last 168  hours. Cardiac Enzymes: Recent Labs  Lab 02/28/20 1019  CKTOTAL 46   No results for input(s): PROBNP in the last 8760 hours. Coagulation Profile: No results for input(s): INR, PROTIME in the last 168 hours. Thyroid Function Tests: No results for input(s): TSH, T4TOTAL, FREET4, T3FREE, THYROIDAB in the last 72 hours. Lipid Profile: No results for input(s): CHOL, HDL, LDLCALC, TRIG, CHOLHDL, LDLDIRECT in the last 72 hours. Anemia Panel: Recent Labs    03/03/20 0253  VITAMINB12 726  FOLATE 26.3  FERRITIN 85  TIBC 312  IRON 22*  RETICCTPCT 1.7   Urine analysis:  Component Value Date/Time   COLORURINE AMBER (A) 02/28/2020 1019   APPEARANCEUR CLOUDY (A) 02/28/2020 1019   LABSPEC 1.012 02/28/2020 1019   PHURINE 8.0 02/28/2020 1019   GLUCOSEU NEGATIVE 02/28/2020 1019   HGBUR NEGATIVE 02/28/2020 1019   BILIRUBINUR NEGATIVE 02/28/2020 1019   KETONESUR NEGATIVE 02/28/2020 1019   PROTEINUR NEGATIVE 02/28/2020 1019   UROBILINOGEN 0.2 07/07/2014 1451   NITRITE POSITIVE (A) 02/28/2020 1019   LEUKOCYTESUR NEGATIVE 02/28/2020 1019   Sepsis Labs: Invalid input(s): PROCALCITONIN, [place]  Microbiology: Recent Results (from the past 240 hour(s))  Resp Panel by RT-PCR (Flu A&B, Covid) Nasopharyngeal Swab     Status: None   Collection Time: 02/28/20 10:19 AM   Specimen: Nasopharyngeal Swab; Nasopharyngeal(NP) swabs in vial transport medium  Result Value Ref Range Status   SARS Coronavirus 2 by RT PCR NEGATIVE NEGATIVE Final    Comment: (NOTE) SARS-CoV-2 target nucleic acids are NOT DETECTED.  The SARS-CoV-2 RNA is generally detectable in upper respiratory specimens during the acute phase of infection. The lowest concentration of SARS-CoV-2 viral copies this assay can detect is 138 copies/mL. A negative result does not preclude SARS-Cov-2 infection and should not be used as the sole basis for treatment or other patient management decisions. A negative result may occur with    improper specimen collection/handling, submission of specimen other than nasopharyngeal swab, presence of viral mutation(s) within the areas targeted by this assay, and inadequate number of viral copies(<138 copies/mL). A negative result must be combined with clinical observations, patient history, and epidemiological information. The expected result is Negative.  Fact Sheet for Patients:  EntrepreneurPulse.com.au  Fact Sheet for Healthcare Providers:  IncredibleEmployment.be  This test is no t yet approved or cleared by the [place] FDA and  has been authorized for detection and/or diagnosis of SARS-CoV-2 by FDA under an Emergency Use Authorization (EUA). This EUA will remain  in effect (meaning this test can be used) for the duration of the COVID-19 declaration under Section 564(b)(1) of the Act, 21 U.S.C.section 360bbb-3(b)(1), unless the authorization is terminated  or revoked sooner.       Influenza A by PCR NEGATIVE NEGATIVE Final   Influenza B by PCR NEGATIVE NEGATIVE Final    Comment: (NOTE) The Xpert Xpress SARS-CoV-2/FLU/RSV plus assay is intended as an aid in the diagnosis of influenza from Nasopharyngeal swab specimens and should not be used as a sole basis for treatment. Nasal washings and aspirates are unacceptable for Xpert Xpress SARS-CoV-2/FLU/RSV testing.  Fact Sheet for Patients: EntrepreneurPulse.com.au  Fact Sheet for Healthcare Providers: IncredibleEmployment.be  This test is not yet approved or cleared by the [place] FDA and has been authorized for detection and/or diagnosis of SARS-CoV-2 by FDA under an Emergency Use Authorization (EUA). This EUA will remain in effect (meaning this test can be used) for the duration of the COVID-19 declaration under Section 564(b)(1) of the Act, 21 U.S.C. section 360bbb-3(b)(1), unless the authorization is terminated  or revoked.  Performed at [facility], [place] [address], [place], [place] [address]   Culture, Urine     Status: Abnormal   Collection Time: 02/28/20 10:19 AM   Specimen: Urine, Random  Result Value Ref Range Status   Specimen Description URINE, RANDOM  Final   Special Requests NONE  Final   Culture (A)  Final    <10,000 COLONIES/mL INSIGNIFICANT GROWTH Performed at [facility], [place] [address], [place], [place] [address]    Report Status 02/29/2020 FINAL  Final  Radiology Studies: No results found.    Ahlaya Ende [employer]  If 7PM-7AM, please contact night-coverage [email or web address] 03/03/2020, 3:31 PM

## 2020-03-04 ENCOUNTER — Inpatient Hospital Stay: Payer: Medicare Other

## 2020-03-04 DIAGNOSIS — J439 Emphysema, unspecified: Secondary | ICD-10-CM | POA: Diagnosis not present

## 2020-03-04 DIAGNOSIS — Z7401 Bed confinement status: Secondary | ICD-10-CM | POA: Diagnosis not present

## 2020-03-04 DIAGNOSIS — F39 Unspecified mood [affective] disorder: Secondary | ICD-10-CM | POA: Diagnosis not present

## 2020-03-04 DIAGNOSIS — J449 Chronic obstructive pulmonary disease, unspecified: Secondary | ICD-10-CM | POA: Diagnosis not present

## 2020-03-04 DIAGNOSIS — R251 Tremor, unspecified: Secondary | ICD-10-CM | POA: Diagnosis not present

## 2020-03-04 DIAGNOSIS — I4891 Unspecified atrial fibrillation: Secondary | ICD-10-CM | POA: Diagnosis not present

## 2020-03-04 DIAGNOSIS — D508 Other iron deficiency anemias: Secondary | ICD-10-CM | POA: Diagnosis not present

## 2020-03-04 DIAGNOSIS — D509 Iron deficiency anemia, unspecified: Secondary | ICD-10-CM

## 2020-03-04 DIAGNOSIS — Z20822 Contact with and (suspected) exposure to covid-19: Secondary | ICD-10-CM | POA: Diagnosis not present

## 2020-03-04 DIAGNOSIS — F32A Depression, unspecified: Secondary | ICD-10-CM | POA: Diagnosis not present

## 2020-03-04 DIAGNOSIS — D500 Iron deficiency anemia secondary to blood loss (chronic): Secondary | ICD-10-CM

## 2020-03-04 DIAGNOSIS — E860 Dehydration: Principal | ICD-10-CM

## 2020-03-04 DIAGNOSIS — Z66 Do not resuscitate: Secondary | ICD-10-CM | POA: Diagnosis not present

## 2020-03-04 DIAGNOSIS — C20 Malignant neoplasm of rectum: Secondary | ICD-10-CM | POA: Diagnosis not present

## 2020-03-04 DIAGNOSIS — I4819 Other persistent atrial fibrillation: Secondary | ICD-10-CM | POA: Diagnosis not present

## 2020-03-04 DIAGNOSIS — I5033 Acute on chronic diastolic (congestive) heart failure: Secondary | ICD-10-CM | POA: Diagnosis not present

## 2020-03-04 DIAGNOSIS — I5032 Chronic diastolic (congestive) heart failure: Secondary | ICD-10-CM | POA: Diagnosis not present

## 2020-03-04 DIAGNOSIS — I63312 Cerebral infarction due to thrombosis of left middle cerebral artery: Secondary | ICD-10-CM | POA: Diagnosis not present

## 2020-03-04 DIAGNOSIS — F322 Major depressive disorder, single episode, severe without psychotic features: Secondary | ICD-10-CM | POA: Diagnosis not present

## 2020-03-04 DIAGNOSIS — I509 Heart failure, unspecified: Secondary | ICD-10-CM | POA: Diagnosis not present

## 2020-03-04 DIAGNOSIS — E46 Unspecified protein-calorie malnutrition: Secondary | ICD-10-CM | POA: Diagnosis not present

## 2020-03-04 DIAGNOSIS — I959 Hypotension, unspecified: Secondary | ICD-10-CM | POA: Diagnosis not present

## 2020-03-04 DIAGNOSIS — F0390 Unspecified dementia without behavioral disturbance: Secondary | ICD-10-CM | POA: Diagnosis not present

## 2020-03-04 DIAGNOSIS — I639 Cerebral infarction, unspecified: Secondary | ICD-10-CM | POA: Diagnosis not present

## 2020-03-04 DIAGNOSIS — Z23 Encounter for immunization: Secondary | ICD-10-CM | POA: Diagnosis not present

## 2020-03-04 DIAGNOSIS — S32401S Unspecified fracture of right acetabulum, sequela: Secondary | ICD-10-CM | POA: Diagnosis not present

## 2020-03-04 DIAGNOSIS — F0150 Vascular dementia without behavioral disturbance: Secondary | ICD-10-CM | POA: Diagnosis not present

## 2020-03-04 DIAGNOSIS — R350 Frequency of micturition: Secondary | ICD-10-CM | POA: Diagnosis not present

## 2020-03-04 DIAGNOSIS — M6259 Muscle wasting and atrophy, not elsewhere classified, multiple sites: Secondary | ICD-10-CM | POA: Diagnosis not present

## 2020-03-04 DIAGNOSIS — G9340 Encephalopathy, unspecified: Secondary | ICD-10-CM | POA: Diagnosis not present

## 2020-03-04 DIAGNOSIS — M2550 Pain in unspecified joint: Secondary | ICD-10-CM | POA: Diagnosis not present

## 2020-03-04 DIAGNOSIS — S32409A Unspecified fracture of unspecified acetabulum, initial encounter for closed fracture: Secondary | ICD-10-CM | POA: Diagnosis not present

## 2020-03-04 DIAGNOSIS — R531 Weakness: Secondary | ICD-10-CM | POA: Diagnosis not present

## 2020-03-04 DIAGNOSIS — E440 Moderate protein-calorie malnutrition: Secondary | ICD-10-CM | POA: Diagnosis not present

## 2020-03-04 LAB — RESP PANEL BY RT-PCR (FLU A&B, COVID) ARPGX2
Influenza A by PCR: NEGATIVE
Influenza B by PCR: NEGATIVE
SARS Coronavirus 2 by RT PCR: NEGATIVE

## 2020-03-04 MED ORDER — FERROUS SULFATE 325 (65 FE) MG PO TBEC: 325.0000 mg | DELAYED_RELEASE_TABLET | Freq: Two times a day (BID) | ORAL | 3 refills | Status: AC

## 2020-03-04 MED ORDER — IPRATROPIUM-ALBUTEROL 0.5-2.5 (3) MG/3ML IN SOLN: 3.0000 mL | Freq: Four times a day (QID) | RESPIRATORY_TRACT | Status: AC | PRN

## 2020-03-04 MED ORDER — ATORVASTATIN CALCIUM 20 MG PO TABS: 20.0000 mg | ORAL_TABLET | Freq: Every day | ORAL | 1 refills | Status: AC

## 2020-03-04 MED ORDER — SENNOSIDES-DOCUSATE SODIUM 8.6-50 MG PO TABS: 1.0000 | ORAL_TABLET | Freq: Two times a day (BID) | ORAL | 0 refills | Status: AC | PRN

## 2020-03-04 MED ORDER — DILTIAZEM HCL ER COATED BEADS 240 MG PO CP24
240.0000 mg | ORAL_CAPSULE | Freq: Every day | ORAL | 1 refills | Status: DC
Start: 2020-03-04 — End: 2020-05-31

## 2020-03-04 NOTE — Plan of Care (Signed)
  Problem: Education: Goal: Knowledge of General Education information will improve Description: Including pain rating scale, medication(s)/side effects and non-pharmacologic comfort measures Outcome: Progressing   Problem: Health Behavior/Discharge Planning: Goal: Ability to manage health-related needs will improve Outcome: Progressing   Problem: Clinical Measurements: Goal: Ability to maintain clinical measurements within normal limits will improve Outcome: Progressing Goal: Diagnostic test results will improve Outcome: Progressing Goal: Respiratory complications will improve Outcome: Progressing   Problem: Activity: Goal: Risk for activity intolerance will decrease Outcome: Progressing   Problem: Nutrition: Goal: Adequate nutrition will be maintained Outcome: Progressing   Problem: Coping: Goal: Level of anxiety will decrease Outcome: Progressing   Problem: Elimination: Goal: Will not experience complications related to bowel motility Outcome: Progressing Goal: Will not experience complications related to urinary retention Outcome: Progressing   Problem: Pain Managment: Goal: General experience of comfort will improve Outcome: Progressing   Problem: Safety: Goal: Ability to remain free from injury will improve Outcome: Progressing   Problem: Skin Integrity: Goal: Risk for impaired skin integrity will decrease Outcome: Progressing

## 2020-03-04 NOTE — Progress Notes (Signed)
   Covid-19 Vaccination Clinic  Name:  [name]    MRN: [id number] DOB: 11/12/1932  03/04/2020  [name] was observed post Covid-19 immunization for 15   Minutes  without incident. She was provided with Vaccine Information Sheet and instruction to access the V-Safe system.   [name] was instructed to call 911 with any severe reactions post vaccine: Marland Kitchen Difficulty breathing  . Swelling of face and throat  . A fast heartbeat  . A bad rash all over body  . Dizziness and weakness

## 2020-03-04 NOTE — Progress Notes (Signed)
AVS, signed DNR form, and social worker's paperwork placed in discharge packet. Report called and given to Maudie Mercury, LPN at [facility]. All questions answered to satisfaction. [facility] to transport pt with all belongings.

## 2020-03-04 NOTE — Progress Notes (Signed)
Occupational Therapy Treatment Patient Details Name: Megan Munoz MRN: [id number] DOB: 05/15/1932 Today's Date: 03/04/2020    History of present illness 86 year old female with past medical history of atrial fibrillation, COPD, hypertension, dementia.  Patient recently diagnosed with pelvic fracture being treated with weightbearing as tolerated as well as recent UTI currently on antibiotics.  She to ED on 12/1 for evaluation of weakness and generalized pain.   OT comments  Pt making good progress with functional goals. Pt ambulated to bathroom with RW hygiene at sink and to [facility] for toileting tasks. Pt very pleasant and cooperative. OT will continue to follow acutely to maximize level of function and safety  Follow Up Recommendations  SNF;Supervision/Assistance - 24 hour    Equipment Recommendations  Other (comment) (TBD at next venue of care)    Recommendations for Other Services      Precautions / Restrictions Precautions Precautions: Fall Restrictions Weight Bearing Restrictions: No       Mobility Bed Mobility Overal bed mobility: Needs Assistance             General bed mobility comments: up in chair upon arrival  Transfers Overall transfer level: Needs assistance Equipment used: Rolling walker (2 wheeled) Transfers: Sit to/from Stand Sit to Stand: Min assist         General transfer comment: Min assist for power up, steadying, and hand placement when rising. STS x3, from recliner, BSC, and chair mid-ambulation.    Balance Overall balance assessment: Needs assistance;History of Falls Sitting-balance support: No upper extremity supported Sitting balance-Leahy Scale: Fair     Standing balance support: Bilateral upper extremity supported;During functional activity Standing balance-Leahy Scale: Poor                             ADL either performed or assessed with clinical judgement   ADL Overall ADL's : Needs assistance/impaired     Grooming:  Wash/dry hands;Wash/dry face;Min guard;Standing;Cueing for safety       Lower Body Bathing: Moderate assistance;Sitting/lateral leans Lower Body Bathing Details (indicate cue type and reason): simulated seated in recliner     Lower Body Dressing: Moderate assistance   Toilet Transfer: Minimal assistance;Ambulation;RW;Cueing for safety;Cueing for sequencing;BSC   Toileting- Clothing Manipulation and Hygiene: Minimal assistance;Sit to/from stand;Cueing for safety Toileting - Clothing Manipulation Details (indicate cue type and reason): min A with clothing mgt and anterior hygiene, total A with posterior hygiene     Functional mobility during ADLs: Minimal assistance;Rolling walker;Cueing for sequencing;Cueing for safety       Vision Baseline Vision/History: Wears glasses Patient Visual Report: No change from baseline     Perception     Praxis      Cognition Arousal/Alertness: Awake/alert Behavior During Therapy: WFL for tasks assessed/performed Overall Cognitive Status: History of cognitive impairments - at baseline                                 General Comments: history of dementia, answers questions appropriately but requires increased time. Memory difficulty noted, repeats same things        Exercises     Shoulder Instructions       General Comments PT assisting with pericare, pt unable to perform    Pertinent Vitals/ Pain       Pain Assessment: Faces Faces Pain Scale: Hurts a little bit Pain Location: generalized, "all over, nothing specific" Pain Descriptors /  Indicators: Discomfort;Grimacing Pain Intervention(s): Limited activity within patient's tolerance;Monitored during session;Repositioned  Home Living                                          Prior Functioning/Environment              Frequency  Min 2X/week        Progress Toward Goals  OT Goals(current goals can now be found in the care plan section)   Progress towards OT goals: Progressing toward goals  Acute Rehab OT Goals Patient Stated Goal: go home when ready  Plan Discharge plan remains appropriate    Co-evaluation                 AM-PAC OT "6 Clicks" Daily Activity     Outcome Measure   Help from another person eating meals?: None Help from another person taking care of personal grooming?: A Little Help from another person toileting, which includes using toliet, bedpan, or urinal?: A Lot Help from another person bathing (including washing, rinsing, drying)?: A Lot Help from another person to put on and taking off regular upper body clothing?: A Little Help from another person to put on and taking off regular lower body clothing?: A Lot 6 Click Score: 16    End of Session Equipment Utilized During Treatment: Gait belt;Rolling walker;Other (comment) (BSC)  OT Visit Diagnosis: Unsteadiness on feet (R26.81);Other abnormalities of gait and mobility (R26.89);History of falling (Z91.81);Other symptoms and signs involving cognitive function;Pain   Activity Tolerance Patient tolerated treatment well   Patient Left in chair;with call bell/phone within reach;with chair alarm set   Nurse Communication          Time: [phone] OT Time Calculation (min): 31 min  Charges: OT General Charges $OT Visit: 1 Visit OT Treatments $Self Care/Home Management : 8-22 mins $Therapeutic Activity: 8-22 mins     Rokhaya, Quinn 03/04/2020, 4:25 PM

## 2020-03-04 NOTE — TOC Progression Note (Addendum)
Transition of Care [facility]) - Progression Note    Patient Details  Name: Megan Munoz MRN: [id number] Date of Birth: Oct 17, 1932  Transition of [facility]) CM/SW Contact  Jacalyn Lefevre Edson Snowball, RN Phone Number: 03/04/2020, 8:19 AM  Clinical Narrative:     Left patient's daughter [name] message regarding her decision on SNF choice, awaiting call back.    [phone] H  Daughter [name] returned call [phone] she has accepted bed offer at [facility] Per [name] they can admit today. NCM called and left Janie a voicemail, awaiting call back. Patient will need covid test   Covid test negative. Janie at [facility] ready for patient . Patient to get Opal booster prior to discharge.   Nurse to call report to [phone] patient going to room 3222.   PTAR called daughter, patient aware. Bedside nurse will call report  Expected Discharge Plan: [place]    Expected Discharge Plan and Services Expected Discharge Plan: [place]   Discharge Planning Services: CM Consult Post Acute Care Choice: [place] Living arrangements for the past 2 months: Single Family Home                 DME Arranged: N/A DME Agency: NA       HH Arranged: NA           Social Determinants of Health (SDOH) Interventions    Readmission Risk Interventions No flowsheet data found.

## 2020-03-04 NOTE — Discharge Summary (Addendum)
Physician Discharge Summary  Megan Munoz SWF:[id number] DOB: [birth year] DOA: 02/28/2020  PCP: Fanny Bien, MD  Admit date: 02/28/2020 Discharge date: 03/04/2020  Admitted From: Home Disposition: SNF  Recommendations for Outpatient Follow-up:  1. Follow ups as below. 2. Please obtain CBC/BMP/Mag at follow up 3. Please follow up on the following pending results: None   Discharge Condition: Stable CODE STATUS: DNR/DNI   Contact information for after-discharge care    Destination    [facility] Preferred SNF .   Service: Skilled Nursing Contact information: [place] [phone]                  Hospital Course: 86 year old F with history of right acetabular fracture, dementia, resting tremor, COPD, persistent A. fib, and diastolic CHF brought to ED with increased weakness and confusion found to be in A. fib with RVR in the setting of dehydration.  Initially, there was concern about UTI based on UA and a started on ceftriaxone that she received from 12/1 through 12/3.   However, UTI felt unlikely  later on.  Urine culture was negative.  Mental status and RVR resolved with hydration and adjustment of a Cardizem dose.  Therapy recommended SNF.     Discharge Diagnoses:  Generalized weakness and acute metabolic encephalopathy in the setting of dehydration: UTI ruled out.  Antibiotic discontinued.  Urine culture was negative.  Mental status seems to be back to baseline. -Patient will be discharged to SNF for daily therapy  Subacute right pelvic involving the medial aspect of right acetabulum: POA.  Initially diagnosed 5 weeks prior to admission.  She was WBAT -Continue conservative care with WBAT and physical therapy -Fall precautions -Continue vitamin D and calcitonin -Tylenol for pain control -Outpatient follow-up with her orthopedic surgeon.  Persistent atrial fibrillation: Rate controlled.   CHA2DS2-VASc >5 -Continue Cardizem CD 240 mg daily -On reduced dose of Xarelto 10 mg daily.  Chronic COPD stable -Continue Trelegy Ellipta and as needed DuoNeb  Chronic diastolic congestive heart failure: TTE in 2019 with EF of 50 to 23%, diastolic impairment, moderate MVR, moderate TVR,  moderate LAE and severe RAE and PA PP of 49.  Appears compensated and euvolemic. -Continue on home dose of Lasix -Monitor fluid status and daily weight  History of left MCA CVA: Stable -Home medications  Iron deficiency anemia: Iron saturation 7% with normal TIBC and ferritin.  H&H stable. -IV Feraheme x1 on 12/5. -Continue p.o. ferrous sulfate BID with Senokot-S -Recheck CBC in 1 week.  Hyperlipidemia -Simvastatin changed to atorvastatin due to high risk of rhabdo with simvastatin and concurrent diltiazem  Dementia without behavioral disturbance: Fairly oriented. -Home medications-Aricept and Namenda -Reorientation and delirium precautions  Mood disorder: Stable. -Continue Lexapro and Lamictal.  Resting tremor: Stable. -Could benefit from neurology follow-up.  Moderate malnutrition Body mass index is 20.99 kg/m.  -Continue multivitamin and Ensure  Preventive medicine -Received a flu shot prior to discharge.          Discharge Exam: Vitals:   03/04/20 0015 03/04/20 0534  BP: (!) 124/51 (!) 144/65  Pulse: 66 72  Resp: 20 20  Temp: 98.2 F (36.8 C) (!) 97.4 F (36.3 C)  SpO2: 95% 98%    GENERAL: No apparent distress.  Nontoxic. HEENT: MMM.  Vision and hearing grossly intact.  NECK: Supple.  No apparent JVD.  RESP: On RA.  No IWOB.  Fair aeration bilaterally. CVS:  RRR. Heart sounds normal.  ABD/GI/GU: Bowel sounds  present. Soft. Non tender.  MSK/EXT:  Moves extremities.  Significant muscle mass and subcu fat loss. SKIN: no apparent skin lesion or wound NEURO: Awake, alert and oriented fairly.  No apparent focal neuro deficit other than resting tremor in both  arms. PSYCH: Calm. Normal affect.   Discharge Instructions   Allergies as of 03/04/2020      Reactions   Topiramate Other (See Comments)   Drastic cognitive change, diarrhea, weight loss   Tetanus Toxoids Other (See Comments)   Told never to take       Medication List    STOP taking these medications   simvastatin 40 MG tablet Commonly known as: ZOCOR   sulfamethoxazole-trimethoprim 800-160 MG tablet Commonly known as: BACTRIM DS   VITAMIN B COMPLEX PO     TAKE these medications   acetaminophen 500 MG tablet Commonly known as: TYLENOL Take 1,000 mg by mouth every 6 (six) hours as needed for mild pain, fever or headache.   atorvastatin 20 MG tablet Commonly known as: LIPITOR Take 1 tablet (20 mg total) by mouth daily.   Azelastine HCl 137 MCG/SPRAY Soln Place 2 sprays into both nostrils 2 (two) times daily. Use in each nostril as directed   BENEFIBER DRINK MIX PO Take 10 mLs by mouth daily. Mix with water   diltiazem 240 MG 24 hr capsule Commonly known as: CARDIZEM CD Take 1 capsule (240 mg total) by mouth daily. What changed:   medication strength  how much to take  how to take this  when to take this  additional instructions   donepezil 10 MG tablet Commonly known as: ARICEPT Take 10 mg by mouth at bedtime.   Ensure Take 237 mLs by mouth 3 (three) times daily between meals.   escitalopram 20 MG tablet Commonly known as: LEXAPRO Take 20 mg by mouth daily.   ferrous sulfate 325 (65 FE) MG EC tablet Take 1 tablet (325 mg total) by mouth in the morning and at bedtime. What changed: when to take this   furosemide 20 MG tablet Commonly known as: LASIX Take 20 mg by mouth every other day.   ipratropium-albuterol 0.5-2.5 (3) MG/3ML Soln Commonly known as: DUONEB Take 3 mLs by nebulization every 6 (six) hours as needed (Shortness of breath, cough or wheezing).   lamoTRIgine 100 MG tablet Commonly known as: LaMICtal Take 1 tablet (100 mg total)  by mouth 2 (two) times daily.   loratadine 10 MG tablet Commonly known as: CLARITIN Take 10 mg by mouth daily.   Melatonin 10 MG Tabs Take 10 mg by mouth at bedtime.   memantine 10 MG tablet Commonly known as: Namenda Take 1 tablet (10 mg total) by mouth 2 (two) times daily.   Miacalcin 200 UNIT/ACT nasal spray Generic drug: calcitonin (salmon) Place 1 spray into alternate nostrils daily.   multivitamin capsule Take 1 capsule by mouth daily.   rivaroxaban 10 MG Tabs tablet Commonly known as: XARELTO Take 1 tablet (10 mg total) by mouth daily.   senna-docusate 8.6-50 MG tablet Commonly known as: Senokot-S Take 1 tablet by mouth 2 (two) times daily between meals as needed for mild constipation.   Trelegy Ellipta 100-62.5-25 MCG/INH Aepb Generic drug: Fluticasone-Umeclidin-Vilant Inhale 1 puff into the lungs daily.   Vitamin D3 Super Strength 50 MCG (2000 UT) Caps Generic drug: Cholecalciferol Take 2,000 Units by mouth daily.       Consultations:  None  Procedures/Studies:   DG Pelvis 1-2 Views  Result Date: 02/28/2020 CLINICAL  DATA:  Bilateral hip pain for 5 weeks. Patient reports falling sometime in the past week. EXAM: PELVIS - 1-2 VIEW COMPARISON:  09/13/2019 FINDINGS: Right-sided pelvic fractures. There is a fracture obstructing the ileo pubic line, of the medial aspect of the acetabulum. A more subtle fracture is no of the lateral right ilium just above the acetabulum. No other convincing fractures. Hip joints, SI joints and pubic symphysis are normally aligned. No bone lesions.  Skeletal structures are demineralized. Soft tissues are unremarkable. IMPRESSION: 1. Right pelvic fractures, specifically the medial right acetabular wall as well as a subtle fracture just superior to the right acetabulum. Consider further fracture assessment with unenhanced thin-section CT. 2. No other visualized fractures.  No dislocation. Electronically Signed   By: Lajean Manes M.D.    On: 02/28/2020 18:27   MR PELVIS WO CONTRAST  Result Date: 02/06/2020 CLINICAL DATA:  Follow-up rectal carcinoma. Status post neoadjuvant radiation therapy and chemotherapy. EXAM: MRI PELVIS WITHOUT CONTRAST TECHNIQUE: Multiplanar multisequence MR imaging of the pelvis was performed. No intravenous contrast was administered. Small amount of [place] gel was administered per rectum to optimize tumor evaluation. COMPARISON:  03/15/2019 FINDINGS: Lower Urinary Tract: Unremarkable urinary bladder. Bowel: No residual soft tissue mass seen at the ano-rectal junction, or elsewhere within the rectum. Mild diverticulosis is seen involving the sigmoid colon, however there is no evidence of diverticulitis. Vascular/Lymphatic: No pathologically enlarged perirectal lymph nodes, or other pelvic lymphadenopathy. Reproductive: Prior hysterectomy. Vaginal cuff is unremarkable in appearance. No adnexal mass identified. Other: None. Musculoskeletal: No suspicious bone lesions identified. IMPRESSION: No residual rectal soft tissue mass identified. No evidence of pelvic metastatic disease. Mild sigmoid diverticulosis, without evidence of diverticulitis. Electronically Signed   By: Marlaine Hind M.D.   On: 02/06/2020 09:59   DG Chest Port 1 View  Result Date: 02/28/2020 CLINICAL DATA:  Weakness. Additional history provided: Patient reports weakness, back pain, hip pain, leg pain. EXAM: PORTABLE CHEST 1 VIEW COMPARISON:  Prior chest radiograph 09/13/2019. FINDINGS: Mild cardiomegaly, unchanged. Aortic atherosclerosis. No appreciable airspace consolidation or pulmonary edema. No evidence of pleural effusion or pneumothorax. No acute bony abnormality identified. IMPRESSION: No evidence of acute cardiopulmonary abnormality. Mild cardiomegaly, unchanged. Aortic Atherosclerosis (ICD10-I70.0). Electronically Signed   By: Kellie Simmering DO   On: 02/28/2020 10:22       The results of significant diagnostics from this hospitalization  (including imaging, microbiology, ancillary and laboratory) are listed below for reference.     Microbiology: Recent Results (from the past 240 hour(s))  Resp Panel by RT-PCR (Flu A&B, Covid) Nasopharyngeal Swab     Status: None   Collection Time: 02/28/20 10:19 AM   Specimen: Nasopharyngeal Swab; Nasopharyngeal(NP) swabs in vial transport medium  Result Value Ref Range Status   SARS Coronavirus 2 by RT PCR NEGATIVE NEGATIVE Final    Comment: (NOTE) SARS-CoV-2 target nucleic acids are NOT DETECTED.  The SARS-CoV-2 RNA is generally detectable in upper respiratory specimens during the acute phase of infection. The lowest concentration of SARS-CoV-2 viral copies this assay can detect is 138 copies/mL. A negative result does not preclude SARS-Cov-2 infection and should not be used as the sole basis for treatment or other patient management decisions. A negative result may occur with  improper specimen collection/handling, submission of specimen other than nasopharyngeal swab, presence of viral mutation(s) within the areas targeted by this assay, and inadequate number of viral copies(<138 copies/mL). A negative result must be combined with clinical observations, patient history, and epidemiological information. The expected  result is Negative.  Fact Sheet for Patients:  EntrepreneurPulse.com.au  Fact Sheet for Healthcare Providers:  IncredibleEmployment.be  This test is no t yet approved or cleared by the [place] FDA and  has been authorized for detection and/or diagnosis of SARS-CoV-2 by FDA under an Emergency Use Authorization (EUA). This EUA will remain  in effect (meaning this test can be used) for the duration of the COVID-19 declaration under Section 564(b)(1) of the Act, 21 U.S.C.section 360bbb-3(b)(1), unless the authorization is terminated  or revoked sooner.       Influenza A by PCR NEGATIVE NEGATIVE Final   Influenza B by PCR  NEGATIVE NEGATIVE Final    Comment: (NOTE) The Xpert Xpress SARS-CoV-2/FLU/RSV plus assay is intended as an aid in the diagnosis of influenza from Nasopharyngeal swab specimens and should not be used as a sole basis for treatment. Nasal washings and aspirates are unacceptable for Xpert Xpress SARS-CoV-2/FLU/RSV testing.  Fact Sheet for Patients: EntrepreneurPulse.com.au  Fact Sheet for Healthcare Providers: IncredibleEmployment.be  This test is not yet approved or cleared by the [place] FDA and has been authorized for detection and/or diagnosis of SARS-CoV-2 by FDA under an Emergency Use Authorization (EUA). This EUA will remain in effect (meaning this test can be used) for the duration of the COVID-19 declaration under Section 564(b)(1) of the Act, 21 U.S.C. section 360bbb-3(b)(1), unless the authorization is terminated or revoked.  Performed at [facility], [place] [address], [place], [place] [address]   Culture, Urine     Status: Abnormal   Collection Time: 02/28/20 10:19 AM   Specimen: Urine, Random  Result Value Ref Range Status   Specimen Description URINE, RANDOM  Final   Special Requests NONE  Final   Culture (A)  Final    <10,000 COLONIES/mL INSIGNIFICANT GROWTH Performed at [facility], [place] [address], [place], [place] [address]    Report Status 02/29/2020 FINAL  Final     Labs: BNP (last 3 results) No results for input(s): BNP in the last 8760 hours. Basic Metabolic Panel: Recent Labs  Lab 02/28/20 1019 02/29/20 0420 03/03/20 0253  NA 142 137 139  K 3.9 3.4* 4.4  CL 105 105 104  CO2 24 22 25   GLUCOSE 79 102* 103*  BUN 21 15 22   CREATININE 0.68 0.71 0.56  CALCIUM 9.4 8.7* 9.1  MG  --  1.8  --    Liver Function Tests: Recent Labs  Lab 02/28/20 1019  AST 27  ALT 14  ALKPHOS 77  BILITOT 0.9  PROT 6.4*  ALBUMIN 3.5   No results for input(s): LIPASE, AMYLASE in the last 168 hours. No results  for input(s): AMMONIA in the last 168 hours. CBC: Recent Labs  Lab 02/28/20 1019 02/29/20 0420 03/03/20 0253  WBC 6.4 5.5 4.0  NEUTROABS 5.2  --   --   HGB 10.9* 10.1* 9.9*  HCT 35.9* 32.1* 30.3*  MCV 100.6* 98.5 96.8  PLT 234 224 247   Cardiac Enzymes: Recent Labs  Lab 02/28/20 1019  CKTOTAL 46   BNP: Invalid input(s): POCBNP CBG: No results for input(s): GLUCAP in the last 168 hours. D-Dimer No results for input(s): DDIMER in the last 72 hours. Hgb A1c No results for input(s): HGBA1C in the last 72 hours. Lipid Profile No results for input(s): CHOL, HDL, LDLCALC, TRIG, CHOLHDL, LDLDIRECT in the last 72 hours. Thyroid function studies No results for input(s): TSH, T4TOTAL, T3FREE, THYROIDAB in the last 72 hours.  Invalid  input(s): FREET3 Anemia work up Recent Labs    03/03/20 0253  VITAMINB12 726  FOLATE 26.3  FERRITIN 85  TIBC 312  IRON 22*  RETICCTPCT 1.7   Urinalysis    Component Value Date/Time   COLORURINE AMBER (A) 02/28/2020 1019   APPEARANCEUR CLOUDY (A) 02/28/2020 1019   LABSPEC 1.012 02/28/2020 1019   PHURINE 8.0 02/28/2020 1019   GLUCOSEU NEGATIVE 02/28/2020 1019   HGBUR NEGATIVE 02/28/2020 1019   BILIRUBINUR NEGATIVE 02/28/2020 1019   KETONESUR NEGATIVE 02/28/2020 1019   PROTEINUR NEGATIVE 02/28/2020 1019   UROBILINOGEN 0.2 07/07/2014 1451   NITRITE POSITIVE (A) 02/28/2020 1019   LEUKOCYTESUR NEGATIVE 02/28/2020 1019   Sepsis Labs Invalid input(s): PROCALCITONIN,  WBC,  LACTICIDVEN   Time coordinating discharge: 40 minutes  SIGNED:  Mercy Riding, MD  [facility] 03/04/2020, 11:45 AM  If 7PM-7AM, please contact night-coverage [email or web address]

## 2020-03-04 NOTE — Progress Notes (Signed)
Physical Therapy Treatment Patient Details Name: Megan Munoz MRN: [id number] DOB: 1932-04-16 Today's Date: 03/04/2020    History of Present Illness 86 year old female with past medical history of atrial fibrillation, COPD, hypertension, dementia.  Patient recently diagnosed with pelvic fracture being treated with weightbearing as tolerated as well as recent UTI currently on antibiotics.  She to ED on 12/1 for evaluation of weakness and generalized pain.    PT Comments    Pt demonstrating mobility progression this day, ambulating room distance x2 and tolerating repeated transfers throughout session. Pt complaining of body-wide, mild pain but unable to pinpoint anything specific. Pt states she continues to feel much weaker than normal, SNF remains appropriate d/c plan at this time. Will continue to follow acutely.    Follow Up Recommendations  SNF;Supervision/Assistance - 24 hour     Equipment Recommendations  None recommended by PT    Recommendations for Other Services       Precautions / Restrictions Precautions Precautions: Fall Restrictions Weight Bearing Restrictions: No    Mobility  Bed Mobility Overal bed mobility: Needs Assistance             General bed mobility comments: up in chair upon arrival  Transfers Overall transfer level: Needs assistance Equipment used: Rolling walker (2 wheeled) Transfers: Sit to/from Stand Sit to Stand: Min assist         General transfer comment: Min assist for power up, steadying, and hand placement when rising. STS x3, from recliner, BSC, and chair mid-ambulation.  Ambulation/Gait Ambulation/Gait assistance: Min assist Gait Distance (Feet): 12 Feet (x2) Assistive device: Rolling walker (2 wheeled) Gait Pattern/deviations: Step-through pattern;Decreased stride length;Trunk flexed Gait velocity: decr   General Gait Details: min assist to steady, physically guide RW. Seated rest break x1 after 12 ft ambulation due to LE  fatigue, DOE 2/4.   Stairs             Wheelchair Mobility    Modified Rankin (Stroke Patients Only)       Balance Overall balance assessment: Needs assistance;History of Falls Sitting-balance support: No upper extremity supported Sitting balance-Leahy Scale: Fair     Standing balance support: Bilateral upper extremity supported;During functional activity Standing balance-Leahy Scale: Poor                              Cognition Arousal/Alertness: Awake/alert Behavior During Therapy: WFL for tasks assessed/performed Overall Cognitive Status: History of cognitive impairments - at baseline                                 General Comments: history of dementia, answers questions appropriately but requires increased time. Memory difficulty noted, repeats same things to PT during session      Exercises      General Comments General comments (skin integrity, edema, etc.): PT assisting with pericare, pt unable to perform      Pertinent Vitals/Pain Pain Assessment: Faces Faces Pain Scale: Hurts a little bit Pain Location: generalized, "all over, nothing specific" Pain Descriptors / Indicators: Discomfort;Grimacing Pain Intervention(s): Limited activity within patient's tolerance;Monitored during session;Repositioned    Home Living                      Prior Function            PT Goals (current goals can now be found in the care plan section) Acute  Rehab PT Goals Patient Stated Goal: go home when ready PT Goal Formulation: With patient Time For Goal Achievement: 03/14/20 Potential to Achieve Goals: Good Progress towards PT goals: Progressing toward goals    Frequency    Min 2X/week      PT Plan Current plan remains appropriate    Co-evaluation              AM-PAC PT "6 Clicks" Mobility   Outcome Measure  Help needed turning from your back to your side while in a flat bed without using bedrails?: A  Little Help needed moving from lying on your back to sitting on the side of a flat bed without using bedrails?: A Little Help needed moving to and from a bed to a chair (including a wheelchair)?: A Little Help needed standing up from a chair using your arms (e.g., wheelchair or bedside chair)?: A Little Help needed to walk in hospital room?: A Little Help needed climbing 3-5 steps with a railing? : A Lot 6 Click Score: 17    End of Session Equipment Utilized During Treatment: Gait belt Activity Tolerance: Patient limited by fatigue Patient left: in chair;with call bell/phone within reach;with chair alarm set Nurse Communication: Mobility status PT Visit Diagnosis: Difficulty in walking, not elsewhere classified (R26.2);History of falling (Z91.81)     Time: 3382-5053 PT Time Calculation (min) (ACUTE ONLY): 19 min  Charges:  $Therapeutic Activity: 8-22 mins                    Jayceion Lisenby E, PT Acute Rehabilitation Services Pager [phone]  Office [phone]    Caroleann Casler D Elonda Husky 03/04/2020, 2:48 PM

## 2020-03-08 DIAGNOSIS — G9340 Encephalopathy, unspecified: Secondary | ICD-10-CM | POA: Diagnosis not present

## 2020-03-08 DIAGNOSIS — I509 Heart failure, unspecified: Secondary | ICD-10-CM | POA: Diagnosis not present

## 2020-03-08 DIAGNOSIS — S32409A Unspecified fracture of unspecified acetabulum, initial encounter for closed fracture: Secondary | ICD-10-CM | POA: Diagnosis not present

## 2020-03-08 DIAGNOSIS — I4891 Unspecified atrial fibrillation: Secondary | ICD-10-CM | POA: Diagnosis not present

## 2020-03-08 DIAGNOSIS — R251 Tremor, unspecified: Secondary | ICD-10-CM | POA: Diagnosis not present

## 2020-03-08 DIAGNOSIS — F39 Unspecified mood [affective] disorder: Secondary | ICD-10-CM | POA: Diagnosis not present

## 2020-03-08 DIAGNOSIS — I639 Cerebral infarction, unspecified: Secondary | ICD-10-CM | POA: Diagnosis not present

## 2020-03-08 DIAGNOSIS — F322 Major depressive disorder, single episode, severe without psychotic features: Secondary | ICD-10-CM | POA: Diagnosis not present

## 2020-03-08 DIAGNOSIS — J449 Chronic obstructive pulmonary disease, unspecified: Secondary | ICD-10-CM | POA: Diagnosis not present

## 2020-03-08 DIAGNOSIS — E46 Unspecified protein-calorie malnutrition: Secondary | ICD-10-CM | POA: Diagnosis not present

## 2020-03-08 DIAGNOSIS — F0390 Unspecified dementia without behavioral disturbance: Secondary | ICD-10-CM | POA: Diagnosis not present

## 2020-03-25 ENCOUNTER — Encounter: Payer: Self-pay | Admitting: Neurology

## 2020-03-25 ENCOUNTER — Ambulatory Visit: Payer: Medicare Other | Admitting: Neurology

## 2020-03-25 NOTE — Progress Notes (Deleted)
PATIENT: Megan Munoz DOB: [birth year]  REASON FOR VISIT: follow up HISTORY FROM: patient  HISTORY OF PRESENT ILLNESS: Today 03/25/20  HISTORY  Megan Munoz a 86 yo RH , female, is accompanied by her daughter Megan Munoz, referred by her primary care physician Dr Duanne Guess for continued neurological care for her history of stroke, essential tremor,  She had a history of hypertension, hyperlipidemia, diagnosis of atrial fibrillation since 2015, is taking Xarelto  She suffered left frontal, basal ganglion stroke in 2010, presented with mild confusion, slow processing time, initially was treated with aspirin, since her diagnosis of atrial fibrillation in 2015, she was started on anticoagulation Xarelto  She moved in with her daughter recently, no longer driving, but independent in her daily activity, doing laundry, ambulate without assistance, mild memory trouble today's Mini-Mental Status Examination is 26 out of 30  She had long-standing history of gradual onset bilateral hands tremor, was diagnosed with essential tremor, acute worsening since her stroke in 2010, they have tried variable dose of primidone up to 750 mg daily, currently taking 250 mg twice a day, which works out best for her, she still has posturing tremor of her bilateral hands  UPDATE Dec 11 2014: MRI reportin2016 1. No MR evidence of an acute intracranial abnormality. 2. Sequela of previous left frontal infarct (with resultant cystic encephalomalacia with surrounding gliosis). 3. Age-appropriate global cerebral volume loss and background microvascular disease. 4. No MR evidence of abnormal intracranial enhancement. 5. Bilateral maxillary sinus disease with small fluid levels suggesting an acute sinusitis.  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,  We personally reviewed MRI of the brain 2016, evidence of profound atrophy, left frontal encephalomalacia, atrophy of left mesial temporal lobe,  She had 15 years of education retired as a Diplomatic Services operational officer for a [employer], she is now living with herdaughter.She is no longer cooking, increase mild unsteady gait,  UPDATE June 20 2019 She has lived with her daughter since 2010 following her stroke, is on Aricept, Namenda for memory loss, denies a history of seizure, she was started on Topamax for bilateral hands tremor, while taking 100 mg twice a day, she was noted to have worsening confusion, it was stopped in February 2021, she seems to have mild improvement,  She has mild unsteady gait, use walker for longer distance, just finished physical therapy, she also underwent chemo and radiation therapy 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   MRI of pelvic with without contrast showed 3 cm polypoid mass along the right anterior lateral aspect of the anal verge,she was diagnosed with rectal adenocarcinoma, underwent radiation and chemotherapy,  repeat MRI of pelvic in December 2020 showed resolution of the mass,  Laboratory evaluation in 2021,  normal CMP, CBC showed hemoglobin 11.7,  Update September 21, 2019 SS: Via virtual visit, is at her home, accompanied by her daughter.  Now taking lamotrigine 100 mg twice a day, due to potential for partial seizure, also helping mood stabilizer and with tremor.  Did not respond well to Topamax, had confusion as side effect, this could explain confusion episodes, thought to be possible partial seizure.  However, patient does have left frontal encephalomalacia from previous stroke, making at risk for partial seizure.  Has suffered several falls, went to the ER June 16, CT head showed a right frontal hematoma, no acute intracranial abnormality.  They are now involved in a free program from [facility], " [facility]", has a nurse that comes out, is also just started physical therapy.  She is wearing life alert.  Memory remains stable.  She has discontinued Prozac, is now on Lexapro 20 mg.  Also taking Aricept and Namenda.  Update March 25, 2020 SS:   REVIEW OF SYSTEMS: Out of a complete 14 system review of symptoms, the patient complains only of the following symptoms, and all other reviewed systems are negative.  ALLERGIES: Allergies  Allergen Reactions  . Topiramate Other (See Comments)    Drastic cognitive change, diarrhea, weight loss  . Tetanus Toxoids Other (See Comments)    Told never to take     HOME MEDICATIONS: Outpatient Medications Prior to Visit  Medication Sig Dispense Refill  . acetaminophen (TYLENOL) 500 MG tablet Take 1,000 mg by mouth every 6 (six) hours as needed for mild pain, fever or headache.    Marland Kitchen atorvastatin (LIPITOR) 20 MG tablet Take 1 tablet (20 mg total) by mouth daily. 90 tablet 1  . Azelastine HCl 137 MCG/SPRAY SOLN Place 2 sprays into both nostrils 2 (two) times daily. Use in each nostril as directed     . calcitonin, salmon, (MIACALCIN) 200 UNIT/ACT nasal spray Place 1 spray into alternate nostrils daily.    . Cholecalciferol (VITAMIN D3 SUPER STRENGTH) 2000  units CAPS Take 2,000 Units by mouth daily.     Marland Kitchen diltiazem (CARDIZEM CD) 240 MG 24 hr capsule Take 1 capsule (240 mg total) by mouth daily. 90 capsule 1  . donepezil (ARICEPT) 10 MG tablet Take 10 mg by mouth at bedtime.    . Ensure (ENSURE) Take 237 mLs by mouth 3 (three) times daily between meals.    Marland Kitchen escitalopram (LEXAPRO) 20 MG tablet Take 20 mg by mouth daily.    . ferrous sulfate 325 (65 FE) MG EC tablet Take 1 tablet (325 mg total) by mouth in the morning and at bedtime.  3  . Fluticasone-Umeclidin-Vilant (TRELEGY ELLIPTA) 100-62.5-25 MCG/INH AEPB Inhale 1 puff into the lungs daily.    . furosemide (LASIX) 20 MG tablet Take 20 mg by mouth every other day.    . ipratropium-albuterol (DUONEB) 0.5-2.5 (3) MG/3ML SOLN Take 3 mLs by nebulization every 6 (six) hours as needed (Shortness of breath, cough or wheezing). 360 mL   . lamoTRIgine (LAMICTAL) 100 MG tablet Take 1 tablet (100 mg total) by mouth 2 (two) times daily. 180 tablet 1  . loratadine (CLARITIN) 10 MG tablet Take 10 mg by mouth daily.    . Melatonin 10 MG TABS Take 10 mg by mouth at bedtime.     . memantine (NAMENDA) 10 MG tablet Take 1 tablet (10 mg total) by mouth  2 (two) times daily. 180 tablet 3  . Multiple Vitamin (MULTIVITAMIN) capsule Take 1 capsule by mouth daily.    . rivaroxaban (XARELTO) 10 MG TABS tablet Take 1 tablet (10 mg total) by mouth daily. 90 tablet 3  . senna-docusate (SENOKOT-S) 8.6-50 MG tablet Take 1 tablet by mouth 2 (two) times daily between meals as needed for mild constipation. 180 tablet 0  . Wheat Dextrin (BENEFIBER DRINK MIX PO) Take 10 mLs by mouth daily. Mix with water      No facility-administered medications prior to visit.    PAST MEDICAL HISTORY: Past Medical History:  Diagnosis Date  . Anxiety   . Atherosclerosis of aortic arch [facility]) April 2017   Noted on CT chest, calcified aortic atherosclerosis at the arch.  . Atrial fibrillation, permanent ([place]) 03/2013   Previously on Atenolol for  Rate - now on combination diltiazem and carvedilol for rate Control;  & Xarelto for AC. CHADS2VASC2 = 7.  . Cerebral infarction [facility]) 10/2008   MRI Brain 05/2014 for near syncope revealed: No acure infarct.  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   Mild small vessel disease type changes.  No intracranial hemorrhage.  Global atrophy.   Marland Kitchen COPD (chronic obstructive pulmonary disease) ([place])   . Coronary atherosclerosis due to calcified coronary lesion April 2017   Noted on CT chest  . Dementia ([place])    following 2010 stroke  . Diastolic dysfunction with chronic heart failure (HCC)    Exacerbated by A. fib RVR  . Hyperlipemia   . Hypertension   . Pneumonia   . PONV (postoperative nausea and vomiting)   . Stroke [facility])    2010- patient has dementia related to stroke   . Tinea unguium   . Tremor   . Tuberculosis     PAST SURGICAL HISTORY: Past Surgical History:  Procedure Laterality Date  . BIOPSY  06/01/2018   Procedure: BIOPSY;  Surgeon: Ronnette Juniper, MD;  Location: [facility];  Service: Gastroenterology;;  . BLADDER SURGERY    . CARPAL TUNNEL RELEASE Left   . COLONOSCOPY N/A 06/01/2018   Procedure: COLONOSCOPY;  Surgeon: Ronnette Juniper, MD;  Location: [facility];  Service: Gastroenterology;  Laterality: N/A;  . ESOPHAGOGASTRODUODENOSCOPY (EGD) WITH PROPOFOL N/A 02/04/2018   Procedure: ESOPHAGOGASTRODUODENOSCOPY (EGD) WITH PROPOFOL;  Surgeon: Ronnette Juniper, MD;  Location: [facility];  Service: Gastroenterology;  Laterality: N/A;  . FOOT SURGERY Right   . HERNIA REPAIR    . NASAL SINUS SURGERY    . POLYPECTOMY  06/01/2018   Procedure: POLYPECTOMY;  Surgeon: Ronnette Juniper, MD;  Location: [facility];  Service: Gastroenterology;;  . TOTAL ABDOMINAL HYSTERECTOMY      FAMILY HISTORY: Family History  Problem Relation Age of Onset  . Osteoporosis Mother   . Emphysema Father   . Lung disease Father         Black Lung  . Stroke Brother   . Cancer Maternal Grandmother   . Emphysema Paternal Grandmother   . Asthma Daughter        allergy induced    SOCIAL HISTORY: Social History   Socioeconomic History  . Marital status: Widowed    Spouse name: Not on file  . Number of children: 1  . Years of education: 68  . Highest education level: Not on file  Occupational History  . Occupation: Retired  Tobacco Use  . Smoking status: Former Smoker  Packs/day: 1.00    Years: 16.00    Pack years: 16.00    Start date: 03/30/1954    Quit date: 03/30/1970    Years since quitting: 50.0  . Smokeless tobacco: Never Used  . Tobacco comment: smoked 1/2-1 ppd  Vaping Use  . Vaping Use: Never used  Substance and Sexual Activity  . Alcohol use: Not Currently    Alcohol/week: 0.0 standard drinks  . Drug use: No  . Sexual activity: Not on file  Other Topics Concern  . Not on file  Social History Narrative   Widow.  Lives with her daughter & her family.  (Son-in-law & 5 Grandsons).   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   2-4 cups caffeine/day   1-2 glassess of wine/beer  Per week.  Never smoked.      [facility]:   Originally from [place]. She lived in [place], [place], [place], & [place]. 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 No bird exposure. No mold or hot tub exposure.    Social Determinants of Health   Financial Resource Strain: Not on file  Food Insecurity: Not on file  Transportation Needs: Not on file  Physical Activity: Not on file  Stress: Not on file  Social Connections: Not on file  Intimate Partner Violence: Not on file      PHYSICAL EXAM  There were no vitals filed for this visit. There is no height or weight on file to calculate BMI.  Generalized: Well developed, in no acute distress   Neurological examination  Mentation: Alert oriented to time, place, history taking. Follows all commands speech and language  fluent Cranial nerve II-XII: Pupils were equal round reactive to light. Extraocular movements were full, visual field were full on confrontational test. Facial sensation and strength were normal. Uvula tongue midline. Head turning and shoulder shrug  were normal and symmetric. Motor: The motor testing reveals 5 over 5 strength of all 4 extremities. Good symmetric motor tone is noted throughout.  Sensory: Sensory testing is intact to soft touch on all 4 extremities. No evidence of extinction is noted.  Coordination: Cerebellar testing reveals good finger-nose-finger and heel-to-shin bilaterally.  Gait and station: Gait is normal. Tandem gait is normal. Romberg is negative. No drift is seen.  Reflexes: Deep tendon reflexes are symmetric and normal bilaterally.   DIAGNOSTIC DATA (LABS, IMAGING, TESTING) - I reviewed patient records, labs, notes, testing and imaging myself where available.  Lab Results  Component Value Date   WBC 4.0 03/03/2020   HGB 9.9 (L) 03/03/2020   HCT 30.3 (L) 03/03/2020   MCV 96.8 03/03/2020   PLT 247 03/03/2020      Component Value Date/Time   NA 139 03/03/2020 0253   K 4.4 03/03/2020 0253   CL 104 03/03/2020 0253   CO2 25 03/03/2020 0253   GLUCOSE 103 (H) 03/03/2020 0253   BUN 22 03/03/2020 0253   CREATININE 0.56 03/03/2020 0253   CREATININE 0.77 10/23/2019 1031   CALCIUM 9.1 03/03/2020 0253   PROT 6.4 (L) 02/28/2020 1019   ALBUMIN 3.5 02/28/2020 1019   AST 27 02/28/2020 1019   AST 21 10/23/2019 1031   ALT 14 02/28/2020 1019   ALT 12 10/23/2019 1031   ALKPHOS 77 02/28/2020 1019   BILITOT 0.9 02/28/2020 1019   BILITOT 0.4 10/23/2019 1031   GFRNONAA >60 03/03/2020 0253   GFRNONAA >60 10/23/2019 1031   GFRAA >60 10/23/2019 1031   No  results found for: CHOL, HDL, LDLCALC, LDLDIRECT, TRIG, CHOLHDL No results found for: HGBA1C Lab Results  Component Value Date   VITAMINB12 726 03/03/2020   Lab Results  Component Value Date   TSH 0.937 07/24/2015       ASSESSMENT AND PLAN 86 y.o. year old female  has a past medical history of Anxiety, Atherosclerosis of aortic arch [facility]) (April 2017), Atrial fibrillation, permanent ([place]) (03/2013), Cerebral infarction ([place]) (10/2008), COPD (chronic obstructive pulmonary disease) ([place]), Coronary atherosclerosis due to calcified coronary lesion (April 2017), Dementia [facility]), Diastolic dysfunction with chronic heart failure ([place]), Hyperlipemia, Hypertension, Pneumonia, PONV (postoperative nausea and vomiting), Stroke ([place]), Tinea unguium, Tremor, and Tuberculosis. here with:  1.  Dementia 2.  Essential tremor 3.  Anxiety 4. Encephalomalacia on MRI of the brain -Central nervous system degenerative disorder with vascular component -MRI of the brain in 2016 showed left frontal encephalomalacia from previous stroke high risk for developing partial seizure -EEG showed mild background slowing -Continue Lamictal 100 mg twice a day   I spent 15 minutes with the patient. 50% of this time was spent   Butler Denmark, [place], DNP 03/25/2020, 5:30 AM [facility] [address] [place], [place] [address] [phone]

## 2020-04-06 DIAGNOSIS — R531 Weakness: Secondary | ICD-10-CM | POA: Diagnosis not present

## 2020-04-06 DIAGNOSIS — F32A Depression, unspecified: Secondary | ICD-10-CM | POA: Diagnosis not present

## 2020-04-06 DIAGNOSIS — I4819 Other persistent atrial fibrillation: Secondary | ICD-10-CM | POA: Diagnosis not present

## 2020-04-06 DIAGNOSIS — I5032 Chronic diastolic (congestive) heart failure: Secondary | ICD-10-CM | POA: Diagnosis not present

## 2020-04-06 DIAGNOSIS — E440 Moderate protein-calorie malnutrition: Secondary | ICD-10-CM | POA: Diagnosis not present

## 2020-04-06 DIAGNOSIS — F0150 Vascular dementia without behavioral disturbance: Secondary | ICD-10-CM | POA: Diagnosis not present

## 2020-04-12 DIAGNOSIS — J449 Chronic obstructive pulmonary disease, unspecified: Secondary | ICD-10-CM | POA: Diagnosis not present

## 2020-04-12 DIAGNOSIS — E46 Unspecified protein-calorie malnutrition: Secondary | ICD-10-CM | POA: Diagnosis not present

## 2020-04-12 DIAGNOSIS — F0390 Unspecified dementia without behavioral disturbance: Secondary | ICD-10-CM | POA: Diagnosis not present

## 2020-04-12 DIAGNOSIS — I639 Cerebral infarction, unspecified: Secondary | ICD-10-CM | POA: Diagnosis not present

## 2020-04-12 DIAGNOSIS — S32409A Unspecified fracture of unspecified acetabulum, initial encounter for closed fracture: Secondary | ICD-10-CM | POA: Diagnosis not present

## 2020-04-12 DIAGNOSIS — R251 Tremor, unspecified: Secondary | ICD-10-CM | POA: Diagnosis not present

## 2020-04-12 DIAGNOSIS — F39 Unspecified mood [affective] disorder: Secondary | ICD-10-CM | POA: Diagnosis not present

## 2020-04-12 DIAGNOSIS — F322 Major depressive disorder, single episode, severe without psychotic features: Secondary | ICD-10-CM | POA: Diagnosis not present

## 2020-04-12 DIAGNOSIS — I509 Heart failure, unspecified: Secondary | ICD-10-CM | POA: Diagnosis not present

## 2020-04-12 DIAGNOSIS — I4891 Unspecified atrial fibrillation: Secondary | ICD-10-CM | POA: Diagnosis not present

## 2020-04-17 DIAGNOSIS — I4819 Other persistent atrial fibrillation: Secondary | ICD-10-CM | POA: Diagnosis not present

## 2020-04-17 DIAGNOSIS — F32A Depression, unspecified: Secondary | ICD-10-CM | POA: Diagnosis not present

## 2020-04-17 DIAGNOSIS — R251 Tremor, unspecified: Secondary | ICD-10-CM | POA: Diagnosis not present

## 2020-04-17 DIAGNOSIS — I5032 Chronic diastolic (congestive) heart failure: Secondary | ICD-10-CM | POA: Diagnosis not present

## 2020-04-17 DIAGNOSIS — E440 Moderate protein-calorie malnutrition: Secondary | ICD-10-CM | POA: Diagnosis not present

## 2020-04-17 DIAGNOSIS — F0150 Vascular dementia without behavioral disturbance: Secondary | ICD-10-CM | POA: Diagnosis not present

## 2020-04-17 DIAGNOSIS — R531 Weakness: Secondary | ICD-10-CM | POA: Diagnosis not present

## 2020-04-17 DIAGNOSIS — S32401S Unspecified fracture of right acetabulum, sequela: Secondary | ICD-10-CM | POA: Diagnosis not present

## 2020-04-19 DIAGNOSIS — I5032 Chronic diastolic (congestive) heart failure: Secondary | ICD-10-CM | POA: Diagnosis not present

## 2020-04-19 DIAGNOSIS — R350 Frequency of micturition: Secondary | ICD-10-CM | POA: Diagnosis not present

## 2020-04-19 DIAGNOSIS — F0150 Vascular dementia without behavioral disturbance: Secondary | ICD-10-CM | POA: Diagnosis not present

## 2020-04-19 DIAGNOSIS — I4819 Other persistent atrial fibrillation: Secondary | ICD-10-CM | POA: Diagnosis not present

## 2020-04-19 DIAGNOSIS — R251 Tremor, unspecified: Secondary | ICD-10-CM | POA: Diagnosis not present

## 2020-04-19 DIAGNOSIS — E440 Moderate protein-calorie malnutrition: Secondary | ICD-10-CM | POA: Diagnosis not present

## 2020-04-19 DIAGNOSIS — F32A Depression, unspecified: Secondary | ICD-10-CM | POA: Diagnosis not present

## 2020-04-23 ENCOUNTER — Other Ambulatory Visit: Payer: Self-pay | Admitting: *Deleted

## 2020-04-23 DIAGNOSIS — D508 Other iron deficiency anemias: Secondary | ICD-10-CM | POA: Diagnosis not present

## 2020-04-23 DIAGNOSIS — I4819 Other persistent atrial fibrillation: Secondary | ICD-10-CM | POA: Diagnosis not present

## 2020-04-23 DIAGNOSIS — R531 Weakness: Secondary | ICD-10-CM | POA: Diagnosis not present

## 2020-04-23 DIAGNOSIS — R350 Frequency of micturition: Secondary | ICD-10-CM | POA: Diagnosis not present

## 2020-04-23 NOTE — Patient Outreach (Signed)
Member screened for potential [facility] Care Management needs.  Megan Munoz is receiving skilled therapy at [facility].   SNF SW reports member will return home with daughter likely soon. However, transition date not set yet. Active with [facility]. SNF SW indicates [facility] is aware.  Will follow for transition date. Member will resume services with [facility] and will have home health services upon SNF dc.   Marthenia Rolling, MSN, RN,BSN [place] Acute Care Coordinator [phone] [facility]) [phone]  (Toll free office)

## 2020-04-24 DIAGNOSIS — R350 Frequency of micturition: Secondary | ICD-10-CM | POA: Diagnosis not present

## 2020-04-24 DIAGNOSIS — I5032 Chronic diastolic (congestive) heart failure: Secondary | ICD-10-CM | POA: Diagnosis not present

## 2020-04-24 DIAGNOSIS — D508 Other iron deficiency anemias: Secondary | ICD-10-CM | POA: Diagnosis not present

## 2020-04-24 DIAGNOSIS — R531 Weakness: Secondary | ICD-10-CM | POA: Diagnosis not present

## 2020-05-03 ENCOUNTER — Other Ambulatory Visit: Payer: Self-pay | Admitting: *Deleted

## 2020-05-03 NOTE — Patient Outreach (Signed)
[facility] Post- Acute Care Coordinator follow up. Member screened for potential [facility] Care Management needs.  [name] remains in [facility]. Communication with [facility] SN[facility] SW indicates member's transition plan continues to be home. No definitive date.   [name] was active with [facility] prior. They will follow post SNF.    Megan Rolling, MSN, RN,BSN [place] Acute Care Coordinator [phone] [facility]) [phone]  (Toll free office)

## 2020-05-06 DIAGNOSIS — F0150 Vascular dementia without behavioral disturbance: Secondary | ICD-10-CM | POA: Diagnosis not present

## 2020-05-06 DIAGNOSIS — F32A Depression, unspecified: Secondary | ICD-10-CM | POA: Diagnosis not present

## 2020-05-06 DIAGNOSIS — R531 Weakness: Secondary | ICD-10-CM | POA: Diagnosis not present

## 2020-05-06 DIAGNOSIS — E440 Moderate protein-calorie malnutrition: Secondary | ICD-10-CM | POA: Diagnosis not present

## 2020-05-06 DIAGNOSIS — R251 Tremor, unspecified: Secondary | ICD-10-CM | POA: Diagnosis not present

## 2020-05-06 DIAGNOSIS — I5032 Chronic diastolic (congestive) heart failure: Secondary | ICD-10-CM | POA: Diagnosis not present

## 2020-05-09 DIAGNOSIS — E440 Moderate protein-calorie malnutrition: Secondary | ICD-10-CM | POA: Diagnosis not present

## 2020-05-09 DIAGNOSIS — R531 Weakness: Secondary | ICD-10-CM | POA: Diagnosis not present

## 2020-05-09 DIAGNOSIS — R251 Tremor, unspecified: Secondary | ICD-10-CM | POA: Diagnosis not present

## 2020-05-09 DIAGNOSIS — J449 Chronic obstructive pulmonary disease, unspecified: Secondary | ICD-10-CM | POA: Diagnosis not present

## 2020-05-09 DIAGNOSIS — F32A Depression, unspecified: Secondary | ICD-10-CM | POA: Diagnosis not present

## 2020-05-09 DIAGNOSIS — F0150 Vascular dementia without behavioral disturbance: Secondary | ICD-10-CM | POA: Diagnosis not present

## 2020-05-09 DIAGNOSIS — R350 Frequency of micturition: Secondary | ICD-10-CM | POA: Diagnosis not present

## 2020-05-09 DIAGNOSIS — I5032 Chronic diastolic (congestive) heart failure: Secondary | ICD-10-CM | POA: Diagnosis not present

## 2020-05-09 DIAGNOSIS — I4819 Other persistent atrial fibrillation: Secondary | ICD-10-CM | POA: Diagnosis not present

## 2020-05-10 DIAGNOSIS — I509 Heart failure, unspecified: Secondary | ICD-10-CM | POA: Diagnosis not present

## 2020-05-10 DIAGNOSIS — I4891 Unspecified atrial fibrillation: Secondary | ICD-10-CM | POA: Diagnosis not present

## 2020-05-10 DIAGNOSIS — F0390 Unspecified dementia without behavioral disturbance: Secondary | ICD-10-CM | POA: Diagnosis not present

## 2020-05-10 DIAGNOSIS — E46 Unspecified protein-calorie malnutrition: Secondary | ICD-10-CM | POA: Diagnosis not present

## 2020-05-10 DIAGNOSIS — F39 Unspecified mood [affective] disorder: Secondary | ICD-10-CM | POA: Diagnosis not present

## 2020-05-10 DIAGNOSIS — J449 Chronic obstructive pulmonary disease, unspecified: Secondary | ICD-10-CM | POA: Diagnosis not present

## 2020-05-10 DIAGNOSIS — R251 Tremor, unspecified: Secondary | ICD-10-CM | POA: Diagnosis not present

## 2020-05-10 DIAGNOSIS — F322 Major depressive disorder, single episode, severe without psychotic features: Secondary | ICD-10-CM | POA: Diagnosis not present

## 2020-05-10 DIAGNOSIS — I639 Cerebral infarction, unspecified: Secondary | ICD-10-CM | POA: Diagnosis not present

## 2020-05-17 ENCOUNTER — Other Ambulatory Visit: Payer: Self-pay | Admitting: *Deleted

## 2020-05-17 NOTE — Patient Outreach (Signed)
[place] Coordinator follow up. Member screened for potential [facility] Care Management needs.  Communication sent to [facility] SW to inquire about transition plans.   Will continue to follow while member remains in SNF.    Marthenia Rolling, MSN, RN,BSN [place] Acute Care Coordinator [phone] [facility]) [phone]  (Toll free office)

## 2020-05-18 DIAGNOSIS — R251 Tremor, unspecified: Secondary | ICD-10-CM | POA: Diagnosis not present

## 2020-05-18 DIAGNOSIS — E440 Moderate protein-calorie malnutrition: Secondary | ICD-10-CM | POA: Diagnosis not present

## 2020-05-18 DIAGNOSIS — F0150 Vascular dementia without behavioral disturbance: Secondary | ICD-10-CM | POA: Diagnosis not present

## 2020-05-18 DIAGNOSIS — F32A Depression, unspecified: Secondary | ICD-10-CM | POA: Diagnosis not present

## 2020-05-18 DIAGNOSIS — J449 Chronic obstructive pulmonary disease, unspecified: Secondary | ICD-10-CM | POA: Diagnosis not present

## 2020-05-18 DIAGNOSIS — I4819 Other persistent atrial fibrillation: Secondary | ICD-10-CM | POA: Diagnosis not present

## 2020-05-18 DIAGNOSIS — R531 Weakness: Secondary | ICD-10-CM | POA: Diagnosis not present

## 2020-05-18 DIAGNOSIS — I5032 Chronic diastolic (congestive) heart failure: Secondary | ICD-10-CM | POA: Diagnosis not present

## 2020-05-27 DIAGNOSIS — I4819 Other persistent atrial fibrillation: Secondary | ICD-10-CM | POA: Diagnosis not present

## 2020-05-27 DIAGNOSIS — I5032 Chronic diastolic (congestive) heart failure: Secondary | ICD-10-CM | POA: Diagnosis not present

## 2020-05-27 DIAGNOSIS — R251 Tremor, unspecified: Secondary | ICD-10-CM | POA: Diagnosis not present

## 2020-05-27 DIAGNOSIS — R531 Weakness: Secondary | ICD-10-CM | POA: Diagnosis not present

## 2020-05-27 DIAGNOSIS — F0150 Vascular dementia without behavioral disturbance: Secondary | ICD-10-CM | POA: Diagnosis not present

## 2020-05-27 DIAGNOSIS — F32A Depression, unspecified: Secondary | ICD-10-CM | POA: Diagnosis not present

## 2020-05-27 DIAGNOSIS — J449 Chronic obstructive pulmonary disease, unspecified: Secondary | ICD-10-CM | POA: Diagnosis not present

## 2020-05-28 ENCOUNTER — Other Ambulatory Visit: Payer: Self-pay

## 2020-05-28 ENCOUNTER — Inpatient Hospital Stay (HOSPITAL_COMMUNITY)
Admission: EM | Admit: 2020-05-28 | Discharge: 2020-05-31 | DRG: 394 | Disposition: A | Discharge status: Home / Self Care | Payer: Medicare Other | Attending: Internal Medicine | Admitting: Internal Medicine

## 2020-05-28 ENCOUNTER — Inpatient Hospital Stay (HOSPITAL_COMMUNITY): Payer: Medicare Other

## 2020-05-28 ENCOUNTER — Encounter (HOSPITAL_COMMUNITY): Payer: Self-pay | Admitting: Pharmacy Technician

## 2020-05-28 DIAGNOSIS — Y842 Radiological procedure and radiotherapy as the cause of abnormal reaction of the patient, or of later complication, without mention of misadventure at the time of the procedure: Secondary | ICD-10-CM | POA: Diagnosis present

## 2020-05-28 DIAGNOSIS — R64 Cachexia: Secondary | ICD-10-CM | POA: Diagnosis present

## 2020-05-28 DIAGNOSIS — Z85048 Personal history of other malignant neoplasm of rectum, rectosigmoid junction, and anus: Secondary | ICD-10-CM

## 2020-05-28 DIAGNOSIS — E785 Hyperlipidemia, unspecified: Secondary | ICD-10-CM | POA: Diagnosis present

## 2020-05-28 DIAGNOSIS — E876 Hypokalemia: Secondary | ICD-10-CM | POA: Diagnosis present

## 2020-05-28 DIAGNOSIS — R159 Full incontinence of feces: Secondary | ICD-10-CM | POA: Diagnosis present

## 2020-05-28 DIAGNOSIS — Z7901 Long term (current) use of anticoagulants: Secondary | ICD-10-CM | POA: Diagnosis not present

## 2020-05-28 DIAGNOSIS — R58 Hemorrhage, not elsewhere classified: Secondary | ICD-10-CM | POA: Diagnosis not present

## 2020-05-28 DIAGNOSIS — K627 Radiation proctitis: Secondary | ICD-10-CM | POA: Diagnosis not present

## 2020-05-28 DIAGNOSIS — I4821 Permanent atrial fibrillation: Secondary | ICD-10-CM | POA: Diagnosis present

## 2020-05-28 DIAGNOSIS — Z681 Body mass index (BMI) 19 or less, adult: Secondary | ICD-10-CM | POA: Diagnosis not present

## 2020-05-28 DIAGNOSIS — K449 Diaphragmatic hernia without obstruction or gangrene: Secondary | ICD-10-CM | POA: Diagnosis not present

## 2020-05-28 DIAGNOSIS — K2971 Gastritis, unspecified, with bleeding: Secondary | ICD-10-CM | POA: Diagnosis not present

## 2020-05-28 DIAGNOSIS — I5032 Chronic diastolic (congestive) heart failure: Secondary | ICD-10-CM | POA: Diagnosis present

## 2020-05-28 DIAGNOSIS — I4891 Unspecified atrial fibrillation: Secondary | ICD-10-CM | POA: Diagnosis not present

## 2020-05-28 DIAGNOSIS — Z90710 Acquired absence of both cervix and uterus: Secondary | ICD-10-CM

## 2020-05-28 DIAGNOSIS — Z9221 Personal history of antineoplastic chemotherapy: Secondary | ICD-10-CM

## 2020-05-28 DIAGNOSIS — I2510 Atherosclerotic heart disease of native coronary artery without angina pectoris: Secondary | ICD-10-CM | POA: Diagnosis present

## 2020-05-28 DIAGNOSIS — F32A Depression, unspecified: Secondary | ICD-10-CM | POA: Diagnosis present

## 2020-05-28 DIAGNOSIS — M4319 Spondylolisthesis, multiple sites in spine: Secondary | ICD-10-CM | POA: Diagnosis not present

## 2020-05-28 DIAGNOSIS — C20 Malignant neoplasm of rectum: Secondary | ICD-10-CM | POA: Diagnosis not present

## 2020-05-28 DIAGNOSIS — I700 Atherosclerosis of aorta: Secondary | ICD-10-CM | POA: Diagnosis present

## 2020-05-28 DIAGNOSIS — K922 Gastrointestinal hemorrhage, unspecified: Secondary | ICD-10-CM | POA: Diagnosis not present

## 2020-05-28 DIAGNOSIS — I10 Essential (primary) hypertension: Secondary | ICD-10-CM | POA: Diagnosis present

## 2020-05-28 DIAGNOSIS — K625 Hemorrhage of anus and rectum: Secondary | ICD-10-CM | POA: Diagnosis not present

## 2020-05-28 DIAGNOSIS — J439 Emphysema, unspecified: Secondary | ICD-10-CM | POA: Diagnosis present

## 2020-05-28 DIAGNOSIS — D649 Anemia, unspecified: Secondary | ICD-10-CM | POA: Diagnosis not present

## 2020-05-28 DIAGNOSIS — I110 Hypertensive heart disease with heart failure: Secondary | ICD-10-CM | POA: Diagnosis present

## 2020-05-28 DIAGNOSIS — F419 Anxiety disorder, unspecified: Secondary | ICD-10-CM | POA: Diagnosis present

## 2020-05-28 DIAGNOSIS — I272 Pulmonary hypertension, unspecified: Secondary | ICD-10-CM | POA: Diagnosis present

## 2020-05-28 DIAGNOSIS — Z20822 Contact with and (suspected) exposure to covid-19: Secondary | ICD-10-CM | POA: Diagnosis present

## 2020-05-28 DIAGNOSIS — Z888 Allergy status to other drugs, medicaments and biological substances status: Secondary | ICD-10-CM

## 2020-05-28 DIAGNOSIS — R32 Unspecified urinary incontinence: Secondary | ICD-10-CM | POA: Diagnosis present

## 2020-05-28 DIAGNOSIS — Z825 Family history of asthma and other chronic lower respiratory diseases: Secondary | ICD-10-CM

## 2020-05-28 DIAGNOSIS — R918 Other nonspecific abnormal finding of lung field: Secondary | ICD-10-CM | POA: Diagnosis present

## 2020-05-28 DIAGNOSIS — Z8673 Personal history of transient ischemic attack (TIA), and cerebral infarction without residual deficits: Secondary | ICD-10-CM | POA: Diagnosis not present

## 2020-05-28 DIAGNOSIS — Z923 Personal history of irradiation: Secondary | ICD-10-CM

## 2020-05-28 DIAGNOSIS — Z7951 Long term (current) use of inhaled steroids: Secondary | ICD-10-CM

## 2020-05-28 DIAGNOSIS — M4856XA Collapsed vertebra, not elsewhere classified, lumbar region, initial encounter for fracture: Secondary | ICD-10-CM | POA: Diagnosis not present

## 2020-05-28 DIAGNOSIS — F0390 Unspecified dementia without behavioral disturbance: Secondary | ICD-10-CM | POA: Diagnosis present

## 2020-05-28 DIAGNOSIS — Z66 Do not resuscitate: Secondary | ICD-10-CM | POA: Diagnosis present

## 2020-05-28 DIAGNOSIS — J449 Chronic obstructive pulmonary disease, unspecified: Secondary | ICD-10-CM | POA: Diagnosis present

## 2020-05-28 DIAGNOSIS — Z87891 Personal history of nicotine dependence: Secondary | ICD-10-CM

## 2020-05-28 DIAGNOSIS — R531 Weakness: Secondary | ICD-10-CM | POA: Diagnosis not present

## 2020-05-28 DIAGNOSIS — K7460 Unspecified cirrhosis of liver: Secondary | ICD-10-CM | POA: Diagnosis not present

## 2020-05-28 DIAGNOSIS — I959 Hypotension, unspecified: Secondary | ICD-10-CM | POA: Diagnosis not present

## 2020-05-28 DIAGNOSIS — Z79899 Other long term (current) drug therapy: Secondary | ICD-10-CM

## 2020-05-28 DIAGNOSIS — N939 Abnormal uterine and vaginal bleeding, unspecified: Secondary | ICD-10-CM | POA: Diagnosis present

## 2020-05-28 LAB — COMPREHENSIVE METABOLIC PANEL
ALT: 11 U/L (ref 0–44)
AST: 21 U/L (ref 15–41)
Albumin: 3.6 g/dL (ref 3.5–5.0)
Alkaline Phosphatase: 85 U/L (ref 38–126)
Anion gap: 13 (ref 5–15)
BUN: 12 mg/dL (ref 8–23)
CO2: 22 mmol/L (ref 22–32)
Calcium: 9.2 mg/dL (ref 8.9–10.3)
Chloride: 110 mmol/L (ref 98–111)
Creatinine, Ser: 0.7 mg/dL (ref 0.44–1.00)
GFR, Estimated: >60 mL/min (ref >60)
Glucose, Bld: 102 mg/dL — ABNORMAL HIGH (ref 70–99)
Potassium: 3.7 mmol/L (ref 3.5–5.1)
Sodium: 145 mmol/L (ref 135–145)
Total Bilirubin: 1 mg/dL (ref 0.3–1.2)
Total Protein: 6.2 g/dL — ABNORMAL LOW (ref 6.5–8.1)

## 2020-05-28 LAB — CBC WITH DIFFERENTIAL/PLATELET
Abs Immature Granulocytes: 0.02 10*3/uL (ref 0.00–0.07)
Basophils Absolute: 0 10*3/uL (ref 0.0–0.1)
Basophils Relative: 0 %
Eosinophils Absolute: 0 10*3/uL (ref 0.0–0.5)
Eosinophils Relative: 0 %
HCT: 35 % — ABNORMAL LOW (ref 36.0–46.0)
Hemoglobin: 10.9 g/dL — ABNORMAL LOW (ref 12.0–15.0)
Immature Granulocytes: 0 %
Lymphocytes Relative: 12 %
Lymphs Abs: 0.8 10*3/uL (ref 0.7–4.0)
MCH: 33.3 pg (ref 26.0–34.0)
MCHC: 31.1 g/dL (ref 30.0–36.0)
MCV: 107 fL — ABNORMAL HIGH (ref 80.0–100.0)
Monocytes Absolute: 0.3 10*3/uL (ref 0.1–1.0)
Monocytes Relative: 5 %
Neutro Abs: 5.1 10*3/uL (ref 1.7–7.7)
Neutrophils Relative %: 83 %
Platelets: 239 10*3/uL (ref 150–400)
RBC: 3.27 MIL/uL — ABNORMAL LOW (ref 3.87–5.11)
RDW: 15 % (ref 11.5–15.5)
WBC: 6.2 10*3/uL (ref 4.0–10.5)
nRBC: 0 % (ref 0.0–0.2)

## 2020-05-28 LAB — RESP PANEL BY RT-PCR (FLU A&B, COVID) ARPGX2
Influenza A by PCR: NEGATIVE
Influenza B by PCR: NEGATIVE
SARS Coronavirus 2 by RT PCR: NEGATIVE

## 2020-05-28 LAB — PROTIME-INR
INR: 2.3 — ABNORMAL HIGH (ref 0.8–1.2)
Prothrombin Time: 24.3 seconds — ABNORMAL HIGH (ref 11.4–15.2)

## 2020-05-28 LAB — POC OCCULT BLOOD, ED: Fecal Occult Bld: POSITIVE — AB

## 2020-05-28 LAB — HEMOGLOBIN AND HEMATOCRIT, BLOOD
HCT: 28 % — ABNORMAL LOW (ref 36.0–46.0)
Hemoglobin: 9.4 g/dL — ABNORMAL LOW (ref 12.0–15.0)

## 2020-05-28 LAB — TYPE AND SCREEN
ABO/RH(D): A NEG
Antibody Screen: NEGATIVE

## 2020-05-28 LAB — LIPASE, BLOOD: Lipase: 32 U/L (ref 11–51)

## 2020-05-28 MED ORDER — LAMOTRIGINE 100 MG PO TABS
100.0000 mg | ORAL_TABLET | Freq: Two times a day (BID) | ORAL | Status: DC
Start: 2020-05-28 — End: 2020-05-31
  Administered 2020-05-28 – 2020-05-31 (×6): 100 mg via ORAL
  Filled 2020-05-28 (×7): qty 1

## 2020-05-28 MED ORDER — FLUTICASONE-UMECLIDIN-VILANT 100-62.5-25 MCG/INH IN AEPB: 1.0000 | INHALATION_SPRAY | Freq: Every day | RESPIRATORY_TRACT | Status: DC

## 2020-05-28 MED ORDER — AZELASTINE HCL 0.1 % NA SOLN
2.0000 | Freq: Two times a day (BID) | NASAL | Status: DC
  Administered 2020-05-28 – 2020-05-31 (×5): 2 via NASAL
  Filled 2020-05-28 (×2): qty 30

## 2020-05-28 MED ORDER — ACETAMINOPHEN 500 MG PO TABS: 1000.0000 mg | ORAL_TABLET | Freq: Four times a day (QID) | ORAL | Status: DC | PRN

## 2020-05-28 MED ORDER — DONEPEZIL HCL 10 MG PO TABS
10.0000 mg | ORAL_TABLET | Freq: Every day | ORAL | Status: DC
  Administered 2020-05-28 – 2020-05-30 (×3): 10 mg via ORAL
  Filled 2020-05-28 (×3): qty 1

## 2020-05-28 MED ORDER — PSYLLIUM 95 % PO PACK
1.0000 | PACK | Freq: Every day | ORAL | Status: DC
  Administered 2020-05-29 – 2020-05-31 (×3): 1 via ORAL
  Filled 2020-05-28 (×3): qty 1

## 2020-05-28 MED ORDER — ENSURE ENLIVE PO LIQD
237.0000 mL | Freq: Three times a day (TID) | ORAL | Status: DC
  Administered 2020-05-29 – 2020-05-31 (×7): 237 mL via ORAL

## 2020-05-28 MED ORDER — CALCITONIN (SALMON) 200 UNIT/ACT NA SOLN
1.0000 | Freq: Every day | NASAL | Status: DC
  Administered 2020-05-29 – 2020-05-31 (×3): 1 via NASAL
  Filled 2020-05-28: qty 3.7

## 2020-05-28 MED ORDER — ADULT MULTIVITAMIN W/MINERALS CH
1.0000 | ORAL_TABLET | Freq: Every day | ORAL | Status: DC
  Administered 2020-05-28 – 2020-05-31 (×4): 1 via ORAL
  Filled 2020-05-28 (×4): qty 1

## 2020-05-28 MED ORDER — MEMANTINE HCL 10 MG PO TABS
10.0000 mg | ORAL_TABLET | Freq: Two times a day (BID) | ORAL | Status: DC
  Administered 2020-05-28 – 2020-05-31 (×6): 10 mg via ORAL
  Filled 2020-05-28 (×7): qty 1

## 2020-05-28 MED ORDER — SENNOSIDES-DOCUSATE SODIUM 8.6-50 MG PO TABS
1.0000 | ORAL_TABLET | Freq: Two times a day (BID) | ORAL | Status: DC | PRN
  Administered 2020-05-28: 1 via ORAL
  Filled 2020-05-28: qty 1

## 2020-05-28 MED ORDER — UMECLIDINIUM BROMIDE 62.5 MCG/INH IN AEPB
1.0000 | INHALATION_SPRAY | Freq: Every day | RESPIRATORY_TRACT | Status: DC
  Administered 2020-05-29 – 2020-05-31 (×3): 1 via RESPIRATORY_TRACT
  Filled 2020-05-28: qty 7

## 2020-05-28 MED ORDER — FERROUS SULFATE 325 (65 FE) MG PO TABS
325.0000 mg | ORAL_TABLET | Freq: Every day | ORAL | Status: DC
  Administered 2020-05-29 – 2020-05-31 (×3): 325 mg via ORAL
  Filled 2020-05-28 (×3): qty 1

## 2020-05-28 MED ORDER — BENEFIBER DRINK MIX PO PACK: PACK | Freq: Every day | ORAL | Status: DC

## 2020-05-28 MED ORDER — MULTIVITAMINS PO CAPS: 1.0000 | ORAL_CAPSULE | Freq: Every day | ORAL | Status: DC

## 2020-05-28 MED ORDER — IPRATROPIUM-ALBUTEROL 0.5-2.5 (3) MG/3ML IN SOLN: 3.0000 mL | Freq: Four times a day (QID) | RESPIRATORY_TRACT | Status: DC | PRN

## 2020-05-28 MED ORDER — LORATADINE 10 MG PO TABS
10.0000 mg | ORAL_TABLET | Freq: Every day | ORAL | Status: DC
  Administered 2020-05-28 – 2020-05-31 (×4): 10 mg via ORAL
  Filled 2020-05-28 (×4): qty 1

## 2020-05-28 MED ORDER — FLUTICASONE FUROATE-VILANTEROL 100-25 MCG/INH IN AEPB
1.0000 | INHALATION_SPRAY | Freq: Every day | RESPIRATORY_TRACT | Status: DC
  Administered 2020-05-29 – 2020-05-31 (×3): 1 via RESPIRATORY_TRACT
  Filled 2020-05-28: qty 28

## 2020-05-28 MED ORDER — ATORVASTATIN CALCIUM 10 MG PO TABS
20.0000 mg | ORAL_TABLET | Freq: Every day | ORAL | Status: DC
  Administered 2020-05-28 – 2020-05-31 (×4): 20 mg via ORAL
  Filled 2020-05-28 (×4): qty 2

## 2020-05-28 MED ORDER — ESCITALOPRAM OXALATE 20 MG PO TABS
20.0000 mg | ORAL_TABLET | Freq: Every day | ORAL | Status: DC
  Administered 2020-05-28 – 2020-05-31 (×4): 20 mg via ORAL
  Filled 2020-05-28 (×2): qty 1
  Filled 2020-05-28: qty 2
  Filled 2020-05-28: qty 1

## 2020-05-28 MED ORDER — MELATONIN 5 MG PO TABS
10.0000 mg | ORAL_TABLET | Freq: Every day | ORAL | Status: DC
Start: 2020-05-28 — End: 2020-05-31
  Administered 2020-05-28 – 2020-05-30 (×3): 10 mg via ORAL
  Filled 2020-05-28 (×3): qty 2

## 2020-05-28 MED ORDER — FUROSEMIDE 20 MG PO TABS
20.0000 mg | ORAL_TABLET | ORAL | Status: DC
  Administered 2020-05-28 – 2020-05-30 (×2): 20 mg via ORAL
  Filled 2020-05-28 (×3): qty 1

## 2020-05-28 MED ORDER — DILTIAZEM HCL 60 MG PO TABS
60.0000 mg | ORAL_TABLET | Freq: Four times a day (QID) | ORAL | Status: DC
  Administered 2020-05-28 – 2020-05-31 (×11): 60 mg via ORAL
  Filled 2020-05-28 (×13): qty 1

## 2020-05-28 NOTE — ED Notes (Signed)
Dinner Tray Ordered @ 1721.

## 2020-05-28 NOTE — Consult Note (Signed)
Referring Provider: ED Primary Care Physician:  Fanny Bien, MD Primary Gastroenterologist:  Dr. Therisa [facility])  Reason for Consultation:  Rectal bleeding  HPI: Megan Munoz is a 87 y.o. female with history or rectal cancer (2020) s/p chemo/radiation, A fib (on Xarelto), COPD, and dementia presenting for consultation of rectal bleeding.  Patient reports vaginal bleeding for the past 6-8 weeks, as well as rectal bleeding for several weeks.  She has had incontinence and has been wearing briefs but she does believe she has blood coming from both the vagina and rectum.  She denies any nausea, vomiting, abdominal pain, changes in appetite, or unexplained weight loss.  Denies chest pain or shortness of breath.  Patient was able to tell me she has been in a SNF and able to tell me we are currently in the hospital. Oriented to month/year.  She did not recall being on blood thinners, but per chart review, she is on Xarelto.  Patient diagnosed with rectal cancer in 05/2018.  She underwent chemo/radiation (did not undergo surgery due to age/comorbidities).  Repeat MRI of abdomen/pelvis 01/2020 did not show any residual soft tissue mass in rectum, no evidence of pelvic metastatic disease, mild sigmoid diverticulosis without diverticulitis.  Past Medical History:  Diagnosis Date  . Anxiety   . Atherosclerosis of aortic arch [facility]) April 2017   Noted on CT chest, calcified aortic atherosclerosis at the arch.  . Atrial fibrillation, permanent ([place]) 03/2013   Previously on Atenolol for Rate - now on combination diltiazem and carvedilol for rate Control;  & Xarelto for AC. CHADS2VASC2 = 7.  . Cerebral infarction [facility]) 10/2008   MRI Brain 05/2014 for near syncope revealed: No acure infarct.  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   Mild small vessel disease type changes.  No intracranial hemorrhage.  Global  atrophy.   Marland Kitchen COPD (chronic obstructive pulmonary disease) ([place])   . Coronary atherosclerosis due to calcified coronary lesion April 2017   Noted on CT chest  . Dementia ([place])    following 2010 stroke  . Diastolic dysfunction with chronic heart failure (HCC)    Exacerbated by A. fib RVR  . Hyperlipemia   . Hypertension   . Pneumonia   . PONV (postoperative nausea and vomiting)   . Stroke [facility])    2010- patient has dementia related to stroke   . Tinea unguium   . Tremor   . Tuberculosis     Past Surgical History:  Procedure Laterality Date  . BIOPSY  06/01/2018   Procedure: BIOPSY;  Surgeon: Ronnette Juniper, MD;  [facility];  Service: Gastroenterology;;  . BLADDER SURGERY    . CARPAL TUNNEL RELEASE Left   . COLONOSCOPY N/A 06/01/2018   Procedure: COLONOSCOPY;  Surgeon: Ronnette Juniper, MD;  [facility];  Service: Gastroenterology;  Laterality: N/A;  . ESOPHAGOGASTRODUODENOSCOPY (EGD) WITH PROPOFOL N/A 02/04/2018   Procedure: ESOPHAGOGASTRODUODENOSCOPY (EGD) WITH PROPOFOL;  Surgeon: Ronnette Juniper, MD;  Location: [place];  Service: Gastroenterology;  Laterality: N/A;  . FOOT SURGERY Right   . HERNIA REPAIR    . NASAL SINUS SURGERY    . POLYPECTOMY  06/01/2018   Procedure: POLYPECTOMY;  Surgeon: Ronnette Juniper, MD;  Location: [facility];  Service: Gastroenterology;;  . TOTAL ABDOMINAL HYSTERECTOMY      Prior to Admission medications   Medication Sig Start Date End Date Taking? Authorizing Provider  acetaminophen (TYLENOL) 500 MG  tablet Take 1,000 mg by mouth every 6 (six) hours as needed for mild pain, fever or headache.    [provider]  atorvastatin (LIPITOR) 20 MG tablet Take 1 tablet (20 mg total) by mouth daily. 03/04/20   Mercy Riding, MD  Azelastine HCl 137 MCG/SPRAY SOLN Place 2 sprays into both nostrils 2 (two) times daily. Use in each nostril as directed     [provider]  calcitonin, salmon, (MIACALCIN) 200 UNIT/ACT nasal spray Place 1  spray into alternate nostrils daily.    [provider]  Cholecalciferol (VITAMIN D3 SUPER STRENGTH) 2000 units CAPS Take 2,000 Units by mouth daily.     [provider]  diltiazem (CARDIZEM CD) 240 MG 24 hr capsule Take 1 capsule (240 mg total) by mouth daily. 03/04/20   Mercy Riding, MD  donepezil (ARICEPT) 10 MG tablet Take 10 mg by mouth at bedtime.    [provider]  Ensure (ENSURE) Take 237 mLs by mouth 3 (three) times daily between meals.    [provider]  escitalopram (LEXAPRO) 20 MG tablet Take 20 mg by mouth daily.    [provider]  ferrous sulfate 325 (65 FE) MG EC tablet Take 1 tablet (325 mg total) by mouth in the morning and at bedtime. 03/04/20   Mercy Riding, MD  Fluticasone-Umeclidin-Vilant (TRELEGY ELLIPTA) 100-62.5-25 MCG/INH AEPB Inhale 1 puff into the lungs daily.    [provider]  furosemide (LASIX) 20 MG tablet Take 20 mg by mouth every other day.    [provider]  ipratropium-albuterol (DUONEB) 0.5-2.5 (3) MG/3ML SOLN Take 3 mLs by nebulization every 6 (six) hours as needed (Shortness of breath, cough or wheezing). 03/04/20   Mercy Riding, MD  lamoTRIgine (LAMICTAL) 100 MG tablet Take 1 tablet (100 mg total) by mouth 2 (two) times daily. 10/31/19   Marcial Pacas, MD  loratadine (CLARITIN) 10 MG tablet Take 10 mg by mouth daily.    [provider]  Melatonin 10 MG TABS Take 10 mg by mouth at bedtime.     [provider]  memantine (NAMENDA) 10 MG tablet Take 1 tablet (10 mg total) by mouth 2 (two) times daily. 09/21/19   Suzzanne Cloud, NP  Multiple Vitamin (MULTIVITAMIN) capsule Take 1 capsule by mouth daily.    [provider]  rivaroxaban (XARELTO) 10 MG TABS tablet Take 1 tablet (10 mg total) by mouth daily. 12/22/19   Leonie Man, MD  senna-docusate (SENOKOT-S) 8.6-50 MG tablet Take 1 tablet by mouth 2 (two) times daily between meals as needed for mild constipation. 03/04/20    Mercy Riding, MD  Wheat Dextrin (BENEFIBER DRINK MIX PO) Take 10 mLs by mouth daily. Mix with water     [provider]    Scheduled Meds: Continuous Infusions: PRN Meds:.  Allergies as of 05/28/2020 - Review Complete 05/28/2020  Allergen Reaction Noted  . Topiramate Other (See Comments) 06/20/2019  . Tetanus toxoids Other (See Comments) 05/25/2014    Family History  Problem Relation Age of Onset  . Osteoporosis Mother   . Emphysema Father   . Lung disease Father        Black Lung  . Stroke Brother   . Cancer Maternal Grandmother   . Emphysema Paternal Grandmother   . Asthma Daughter        allergy induced    Social History   Socioeconomic History  . Marital status: Widowed  Spouse name: Not on file  . Number of children: 1  . Years of education: 68  . Highest education level: Not on file  Occupational History  . Occupation: Retired  Tobacco Use  . Smoking status: Former Smoker    Packs/day: 1.00    Years: 16.00    Pack years: 16.00    Start date: 03/30/1954    Quit date: 03/30/1970    Years since quitting: 50.1  . Smokeless tobacco: Never Used  . Tobacco comment: smoked 1/2-1 ppd  Vaping Use  . Vaping Use: Never used  Substance and Sexual Activity  . Alcohol use: Not Currently    Alcohol/week: 0.0 standard drinks  . Drug use: No  . Sexual activity: Not on file  Other Topics Concern  . Not on file  Social History Narrative   Widow.  Lives with her daughter & her family.  (Son-in-law & 5 Grandsons).   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   2-4 cups caffeine/day   1-2 glassess of wine/beer  Per week.  Never smoked.      [facility]:   Originally from [place]. She lived in [place], [place], [place], & [place]. 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 No bird exposure. No mold or hot tub exposure.    Social Determinants of Health   Financial Resource Strain: Not on file  Food Insecurity: Not on  file  Transportation Needs: Not on file  Physical Activity: Not on file  Stress: Not on file  Social Connections: Not on file  Intimate Partner Violence: Not on file    Review of Systems: Review of Systems  Constitutional: Negative for chills and fever.  HENT: Negative for ear pain and sore throat.   Eyes: Negative for pain and redness.  Respiratory: Negative for cough and shortness of breath.   Cardiovascular: Negative for chest pain and palpitations.  Gastrointestinal: Positive for blood in stool. Negative for abdominal pain, constipation, diarrhea, heartburn, melena, nausea and vomiting.  Genitourinary: Negative for dysuria and flank pain.  Skin: Negative for itching and rash.  Neurological: Negative for seizures and loss of consciousness.  Endo/Heme/Allergies: Negative for polydipsia. Does not bruise/bleed easily.  Psychiatric/Behavioral: Positive for memory loss. The patient is not nervous/anxious.      Physical Exam: Vital signs: Vitals:   05/28/20 1130 05/28/20 1145  BP: 128/68 127/84  Pulse: 75 73  Resp: (!) 22 (!) 21  Temp:    SpO2: 100% 100%     Physical Exam Vitals reviewed.  Constitutional:      General: She is not in acute distress. HENT:     Head: Normocephalic and atraumatic.     Nose: No congestion.  Eyes:     Extraocular Movements: Extraocular movements intact.     Conjunctiva/sclera: Conjunctivae normal.  Cardiovascular:     Rate and Rhythm: Normal rate. Rhythm irregular.  Pulmonary:     Effort: Pulmonary effort is normal. No respiratory distress.  Abdominal:     General: Bowel sounds are normal. There is no distension.     Palpations: Abdomen is soft. There is no mass.     Tenderness: There is abdominal tenderness (mild, suprapubic). There is no guarding or rebound.     Hernia: No hernia is present.  Musculoskeletal:        General: No swelling or tenderness.     Cervical back: Normal range of motion and neck supple.  Skin:    General: Skin  is warm and dry.  Neurological:     General: No focal deficit present.     Mental Status: Mental status is at baseline. She is lethargic.  Psychiatric:        Mood and Affect: Mood normal.        Behavior: Behavior normal. Behavior is cooperative.      GI:  Lab Results: Recent Labs    05/28/20 1103  WBC 6.2  HGB 10.9*  HCT 35.0*  PLT 239   BMET Recent Labs    05/28/20 1103  NA 145  K 3.7  CL 110  CO2 22  GLUCOSE 102*  BUN 12  CREATININE 0.70  CALCIUM 9.2   LFT Recent Labs    05/28/20 1103  PROT 6.2*  ALBUMIN 3.6  AST 21  ALT 11  ALKPHOS 85  BILITOT 1.0   PT/INR Recent Labs    05/28/20 1103  LABPROT 24.3*  INR 2.3*     Studies/Results: No results found.  Impression: Rectal bleeding: Radiation protocitis vs. Diverticular bleeding.  Maroon stool in rectal vault per ED provider's exam. -Hgb 10.9, stable as compared to baseline -Hemodynamically stable  History of rectal cancer s/p radiation and chemo; no residual lesion seen on MRI 11/20121  ?Vaginal bleeding  A fib, on Xarelto  Plan: Hold Xarelto for now.  Supportive care.  Clear liquid diet OK from a GI standpoint.  Continue to monitor H&H.  If destabilizing bleeding, consider CT-A.  [facility] will follow.   LOS: 0 days   Salley Slaughter  PA-C 05/28/2020, 12:31 PM  Contact #  [phone]

## 2020-05-28 NOTE — ED Provider Notes (Signed)
[facility] EMERGENCY DEPARTMENT Provider Note   CSN: [id number] Arrival date & time: 05/28/20  1050     History Chief Complaint  Patient presents with  . GI Bleeding    Megan Munoz is a 87 y.o. female.  Patient with history of COPD, CAD, A. fib on Xarelto who presents the ED with rectal bleeding.  First episode of bright red blood last night around 11 PM.  Had another episode about an hour prior to arrival.  Fairly large volume per daughter.  Patient is on Xarelto.  Has a history of rectal cancer but in remission.  Had a normal MRI several months ago that showed no further mass.  Follows with [facility] in the past and also with general surgery.  Completed chemotherapy and radiation.  Patient denies any abdominal pain, shortness of breath, chest pain.  The history is provided by the patient and a caregiver.  GI Problem This is a new problem. The current episode started 12 to 24 hours ago. The problem has not changed since onset.Pertinent negatives include no chest pain, no abdominal pain, no headaches and no shortness of breath. Nothing aggravates the symptoms. Nothing relieves the symptoms. She has tried nothing for the symptoms. The treatment provided no relief.       Past Medical History:  Diagnosis Date  . Anxiety   . Atherosclerosis of aortic arch [facility]) April 2017   Noted on CT chest, calcified aortic atherosclerosis at the arch.  . Atrial fibrillation, permanent ([place]) 03/2013   Previously on Atenolol for Rate - now on combination diltiazem and carvedilol for rate Control;  & Xarelto for AC. CHADS2VASC2 = 7.  . Cerebral infarction [facility]) 10/2008   MRI Brain 05/2014 for near syncope revealed: No acure infarct.  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   Mild small vessel disease type changes.  No intracranial hemorrhage.  Global atrophy.   Marland Kitchen COPD (chronic obstructive  pulmonary disease) ([place])   . Coronary atherosclerosis due to calcified coronary lesion April 2017   Noted on CT chest  . Dementia ([place])    following 2010 stroke  . Diastolic dysfunction with chronic heart failure (HCC)    Exacerbated by A. fib RVR  . Hyperlipemia   . Hypertension   . Pneumonia   . PONV (postoperative nausea and vomiting)   . Stroke [facility])    2010- patient has dementia related to stroke   . Tinea unguium   . Tremor   . Tuberculosis     Patient Active Problem List   Diagnosis Date Noted  . UTI (urinary tract infection) 02/29/2020  . Weakness 02/28/2020  . Urinary tract infection 02/28/2020  . Chronic anticoagulation 02/28/2020  . DNR (do not resuscitate) 02/28/2020  . Cerebrovascular accident (CVA) due to thrombosis of left middle cerebral artery ([place]) 06/20/2019  . Rectal cancer ([place]) 09/21/2018  . Pulmonary hypertension ([place]) 02/07/2018  . Acute on chronic diastolic heart failure ([place]) 02/07/2018  . Respiratory distress   . Encephalomalacia on imaging study 02/03/2018  . COPD (chronic obstructive pulmonary disease) with emphysema ([place]) 02/03/2018  . Gastrointestinal hemorrhage   . CAP (community acquired pneumonia) 02/02/2018  . Tremor 07/21/2017  . Insomnia 07/21/2017  . Dementia without behavioral disturbance ([place]) 07/21/2016  . Gait abnormality 07/21/2016  . Recurrent pneumonia 12/06/2015  . Multiple lung nodules on CT 11/01/2015  . Chronic diastolic CHF (congestive heart failure) ([place])  10/30/2015  . Bilateral lower extremity edema 08/05/2015  . Lobar pneumonia due to unspecified organism 07/30/2015  . Malnutrition of moderate degree 07/29/2015  . Hyponatremia 07/25/2015  . Symptomatic anemia 07/25/2015  . Acute respiratory failure with hypoxia ([place]) 07/24/2015  . Essential hypertension 07/22/2014  . Permanent atrial fibrillation ([place]): CHA2DS2-VASc Score 7 - on Xarelto 07/07/2014  . Hyperlipidemia 07/07/2014  . H/O: stroke 10/29/2008    Past  Surgical History:  Procedure Laterality Date  . BIOPSY  06/01/2018   Procedure: BIOPSY;  Surgeon: Ronnette Juniper, MD;  [facility];  Service: Gastroenterology;;  . BLADDER SURGERY    . CARPAL TUNNEL RELEASE Left   . COLONOSCOPY N/A 06/01/2018   Procedure: COLONOSCOPY;  Surgeon: Ronnette Juniper, MD;  [facility];  Service: Gastroenterology;  Laterality: N/A;  . ESOPHAGOGASTRODUODENOSCOPY (EGD) WITH PROPOFOL N/A 02/04/2018   Procedure: ESOPHAGOGASTRODUODENOSCOPY (EGD) WITH PROPOFOL;  Surgeon: Ronnette Juniper, MD;  Location: [place];  Service: Gastroenterology;  Laterality: N/A;  . FOOT SURGERY Right   . HERNIA REPAIR    . NASAL SINUS SURGERY    . POLYPECTOMY  06/01/2018   Procedure: POLYPECTOMY;  Surgeon: Ronnette Juniper, MD;  [facility];  Service: Gastroenterology;;  . TOTAL ABDOMINAL HYSTERECTOMY       OB History   No obstetric history on file.     Family History  Problem Relation Age of Onset  . Osteoporosis Mother   . Emphysema Father   . Lung disease Father        Black Lung  . Stroke Brother   . Cancer Maternal Grandmother   . Emphysema Paternal Grandmother   . Asthma Daughter        allergy induced    Social History   Tobacco Use  . Smoking status: Former Smoker    Packs/day: 1.00    Years: 16.00    Pack years: 16.00    Start date: 03/30/1954    Quit date: 03/30/1970    Years since quitting: 50.1  . Smokeless tobacco: Never Used  . Tobacco comment: smoked 1/2-1 ppd  Vaping Use  . Vaping Use: Never used  Substance Use Topics  . Alcohol use: Not Currently    Alcohol/week: 0.0 standard drinks  . Drug use: No    Home Medications Prior to Admission medications   Medication Sig Start Date End Date Taking? Authorizing Provider  acetaminophen (TYLENOL) 500 MG tablet Take 1,000 mg by mouth every 6 (six) hours as needed for mild pain, fever or headache.    [provider]  atorvastatin (LIPITOR) 20 MG tablet Take 1 tablet (20 mg total) by  mouth daily. 03/04/20   Mercy Riding, MD  Azelastine HCl 137 MCG/SPRAY SOLN Place 2 sprays into both nostrils 2 (two) times daily. Use in each nostril as directed     [provider]  calcitonin, salmon, (MIACALCIN) 200 UNIT/ACT nasal spray Place 1 spray into alternate nostrils daily.    [provider]  Cholecalciferol (VITAMIN D3 SUPER STRENGTH) 2000 units CAPS Take 2,000 Units by mouth daily.     [provider]  diltiazem (CARDIZEM CD) 240 MG 24 hr capsule Take 1 capsule (240 mg total) by mouth daily. 03/04/20   Mercy Riding, MD  donepezil (ARICEPT) 10 MG tablet Take 10 mg by mouth at bedtime.    [provider]  Ensure (ENSURE) Take 237 mLs by mouth 3 (three) times daily between meals.    [provider]  escitalopram (LEXAPRO) 20  MG tablet Take 20 mg by mouth daily.    [provider]  ferrous sulfate 325 (65 FE) MG EC tablet Take 1 tablet (325 mg total) by mouth in the morning and at bedtime. 03/04/20   Mercy Riding, MD  Fluticasone-Umeclidin-Vilant (TRELEGY ELLIPTA) 100-62.5-25 MCG/INH AEPB Inhale 1 puff into the lungs daily.    [provider]  furosemide (LASIX) 20 MG tablet Take 20 mg by mouth every other day.    [provider]  ipratropium-albuterol (DUONEB) 0.5-2.5 (3) MG/3ML SOLN Take 3 mLs by nebulization every 6 (six) hours as needed (Shortness of breath, cough or wheezing). 03/04/20   Mercy Riding, MD  lamoTRIgine (LAMICTAL) 100 MG tablet Take 1 tablet (100 mg total) by mouth 2 (two) times daily. 10/31/19   Marcial Pacas, MD  loratadine (CLARITIN) 10 MG tablet Take 10 mg by mouth daily.    [provider]  Melatonin 10 MG TABS Take 10 mg by mouth at bedtime.     [provider]  memantine (NAMENDA) 10 MG tablet Take 1 tablet (10 mg total) by mouth 2 (two) times daily. 09/21/19   Suzzanne Cloud, NP  Multiple Vitamin (MULTIVITAMIN) capsule Take 1 capsule by mouth daily.    [provider]   rivaroxaban (XARELTO) 10 MG TABS tablet Take 1 tablet (10 mg total) by mouth daily. 12/22/19   Leonie Man, MD  senna-docusate (SENOKOT-S) 8.6-50 MG tablet Take 1 tablet by mouth 2 (two) times daily between meals as needed for mild constipation. 03/04/20   Mercy Riding, MD  Wheat Dextrin (BENEFIBER DRINK MIX PO) Take 10 mLs by mouth daily. Mix with water     [provider]    Allergies    Topiramate and Tetanus toxoids  Review of Systems   Review of Systems  Constitutional: Negative for chills and fever.  HENT: Negative for ear pain and sore throat.   Eyes: Negative for pain and visual disturbance.  Respiratory: Negative for cough and shortness of breath.   Cardiovascular: Negative for chest pain and palpitations.  Gastrointestinal: Positive for blood in stool. Negative for abdominal pain and vomiting.  Genitourinary: Negative for dysuria and hematuria.  Musculoskeletal: Negative for arthralgias and back pain.  Skin: Negative for color change and rash.  Neurological: Negative for seizures, syncope and headaches.  All other systems reviewed and are negative.   Physical Exam Updated Vital Signs BP (!) 132/57   Pulse 71   Temp 97.9 F (36.6 C)   Resp 19   SpO2 98%   Physical Exam Vitals and nursing note reviewed.  Constitutional:      General: She is not in acute distress.    Appearance: She is well-developed and well-nourished. She is not ill-appearing.  HENT:     Head: Normocephalic and atraumatic.     Nose: Nose normal.     Mouth/Throat:     Mouth: Mucous membranes are moist.  Eyes:     Extraocular Movements: Extraocular movements intact.     Conjunctiva/sclera: Conjunctivae normal.     Pupils: Pupils are equal, round, and reactive to light.  Cardiovascular:     Rate and Rhythm: Normal rate and regular rhythm.     Pulses: Normal pulses.     Heart sounds: Normal heart sounds. No murmur heard.   Pulmonary:     Effort: Pulmonary effort is normal. No  respiratory distress.     Breath sounds: Normal breath sounds.  Abdominal:  Palpations: Abdomen is soft.     Tenderness: There is no abdominal tenderness.  Genitourinary:    Rectum: Guaiac result positive (maroon stool on exam).  Musculoskeletal:        General: No edema.     Cervical back: Normal range of motion and neck supple.  Skin:    General: Skin is warm and dry.     Capillary Refill: Capillary refill takes less than 2 seconds.  Neurological:     General: No focal deficit present.     Mental Status: She is alert.  Psychiatric:        Mood and Affect: Mood and affect normal.     ED Results / Procedures / Treatments   Labs (all labs ordered are listed, but only abnormal results are displayed) Labs Reviewed  CBC WITH DIFFERENTIAL/PLATELET - Abnormal; Notable for the following components:      Result Value   RBC 3.27 (*)    Hemoglobin 10.9 (*)    HCT 35.0 (*)    MCV 107.0 (*)    All other components within normal limits  COMPREHENSIVE METABOLIC PANEL - Abnormal; Notable for the following components:   Glucose, Bld 102 (*)    Total Protein 6.2 (*)    All other components within normal limits  PROTIME-INR - Abnormal; Notable for the following components:   Prothrombin Time 24.3 (*)    INR 2.3 (*)    All other components within normal limits  POC OCCULT BLOOD, ED - Abnormal; Notable for the following components:   Fecal Occult Bld POSITIVE (*)    All other components within normal limits  RESP PANEL BY RT-PCR (FLU A&B, COVID) ARPGX2  LIPASE, BLOOD  TYPE AND SCREEN    EKG None  Radiology No results found.  Procedures Procedures   Medications Ordered in ED Medications - No data to display  ED Course  I have reviewed the triage vital signs and the nursing notes.  Pertinent labs & imaging results that were available during my care of the patient were reviewed by me and considered in my medical decision making (see chart for details).    MDM  Rules/Calculators/A&P                          Megan Munoz is an 87 year old female with history of dementia, high cholesterol, CAD, COPD, stroke, atrial fibrillation on Xarelto who presents to the ED with rectal bleeding.  Normal vitals.  No fever.  2 episodes of bright red blood per rectum.  First episode was about 12 hours ago.  Patient last took Xarelto last night.  Had another episode of bright red blood per rectum this morning.  Patient denies any abdominal pain.  Has a history of rectal cancer but in remission.  Finished chemotherapy and radiation.  Had an MRI several months ago that showed no concerns for mass.  Overall she is hemodynamically stable.  She has maroon stool on exam.  Suspect possibly diverticular bleed as prior colonoscopy showed some diverticular changes and also had some polyps.  Hemoglobin is 10.9 and not far off of her baseline.  Will talk with [facility] and have her admitted to the medicine team for further care.  Hemodynamically stable at this time.  No need for blood transfusion at this time.  No further episodes of rectal bleeding at this time.  This chart was dictated using voice recognition software.  Despite best efforts to proofread,  errors can  occur which can change the documentation meaning.    Final Clinical Impression(s) / ED Diagnoses Final diagnoses:  Lower GI bleed    Rx / DC Orders ED Discharge Orders    None       Lennice Sites, DO 05/28/20 1235

## 2020-05-28 NOTE — ED Triage Notes (Signed)
Pt bib ems from home with reports of GI bleed onset yesterday. EMS reports active bleeding this morning. Pt also with increased weakness and pale on arrival. Pt currently taking xarelto. Hx dementia, tremors, Rectal CA, afib, CHF.  102/50 HR 70 RR 18 98% RA

## 2020-05-28 NOTE — H&P (Addendum)
History and Physical    Megan Munoz JJK:[id number] DOB: 1932-12-26 DOA: 05/28/2020  PCP: Fanny Bien, MD (Confirm with patient/family/NH records and if not entered, this has to be entered at [facility] point of entry) Patient coming from: Nursing home  I have personally briefly reviewed patient's old medical records in [place]  Chief Complaint: Vaginal bleeding  HPI: Megan Munoz is a 87 y.o. female with medical history significant of rectal cancer status post radiation 2019, PAF on Xarelto, mild dementia, depression anxiety, HLD, COPD, presented with recurrent vaginal bleeding and lower GI bleed.  Patient reported she has been in urinary and stool incontinent for 6 to 8 weeks.  She has been wearing a diaper on a daily basis, which has to be changed 3-4 times a day.  Initially she feels "my period comes back", with some discomfort in her lower abdomen and then saw blood and maroon colored stool on the pads sometimes felt stool in vaginal too but not sure. "I can not control my pee, but I can feel pooping" but she said she can hardly hold her stool either. Her stool appears to be formed. She estimated about few LBS weight loss compared to 3 months ago. She had a routine abdomen MRI 5 months ago did not show any significant abnormalities.  She denied any nauseous vomit trouble swallowing and her appetite has been good. ED Course: Hemoglobin 10.9 about the same as before.  No hypotension, no tachycardia, no hypoxia.  Review of Systems: As per HPI otherwise 14 point review of systems negative.    Past Medical History:  Diagnosis Date  . Anxiety   . Atherosclerosis of aortic arch [facility]) April 2017   Noted on CT chest, calcified aortic atherosclerosis at the arch.  . Atrial fibrillation, permanent ([place]) 03/2013   Previously on Atenolol for Rate - now on combination diltiazem and carvedilol for rate Control;  & Xarelto for AC. CHADS2VASC2 = 7.  . Cerebral infarction [facility]) 10/2008   MRI Brain  05/2014 for near syncope revealed: No acure infarct.  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   Mild small vessel disease type changes.  No intracranial hemorrhage.  Global atrophy.   Marland Kitchen COPD (chronic obstructive pulmonary disease) ([place])   . Coronary atherosclerosis due to calcified coronary lesion April 2017   Noted on CT chest  . Dementia ([place])    following 2010 stroke  . Diastolic dysfunction with chronic heart failure (HCC)    Exacerbated by A. fib RVR  . Hyperlipemia   . Hypertension   . Pneumonia   . PONV (postoperative nausea and vomiting)   . Stroke [facility])    2010- patient has dementia related to stroke   . Tinea unguium   . Tremor   . Tuberculosis     Past Surgical History:  Procedure Laterality Date  . BIOPSY  06/01/2018   Procedure: BIOPSY;  Surgeon: Ronnette Juniper, MD;  Location: [facility];  Service: Gastroenterology;;  . BLADDER SURGERY    . CARPAL TUNNEL RELEASE Left   . COLONOSCOPY N/A 06/01/2018   Procedure: COLONOSCOPY;  Surgeon: Ronnette Juniper, MD;  Location: [facility];  Service: Gastroenterology;  Laterality: N/A;  . ESOPHAGOGASTRODUODENOSCOPY (EGD) WITH PROPOFOL N/A 02/04/2018   Procedure: ESOPHAGOGASTRODUODENOSCOPY (EGD) WITH PROPOFOL;  Surgeon: Ronnette Juniper, MD;  Location: [place];  Service: Gastroenterology;  Laterality: N/A;  . FOOT SURGERY Right   . HERNIA REPAIR    .  NASAL SINUS SURGERY    . POLYPECTOMY  06/01/2018   Procedure: POLYPECTOMY;  Surgeon: Ronnette Juniper, MD;  Location: [facility];  Service: Gastroenterology;;  . TOTAL ABDOMINAL HYSTERECTOMY       reports that she quit smoking about 50 years ago. She started smoking about 66 years ago. She has a 16.00 pack-year smoking history. She has never used smokeless tobacco. She reports previous alcohol use. She reports that she does not use drugs.  Allergies  Allergen Reactions  . Topiramate Other (See  Comments)    Drastic cognitive change, diarrhea, weight loss  . Tetanus Toxoids Other (See Comments)    Told never to take     Family History  Problem Relation Age of Onset  . Osteoporosis Mother   . Emphysema Father   . Lung disease Father        Black Lung  . Stroke Brother   . Cancer Maternal Grandmother   . Emphysema Paternal Grandmother   . Asthma Daughter        allergy induced     Prior to Admission medications   Medication Sig Start Date End Date Taking? Authorizing Provider  acetaminophen (TYLENOL) 500 MG tablet Take 1,000 mg by mouth every 6 (six) hours as needed for mild pain, fever or headache.    [provider]  atorvastatin (LIPITOR) 20 MG tablet Take 1 tablet (20 mg total) by mouth daily. 03/04/20   Mercy Riding, MD  Azelastine HCl 137 MCG/SPRAY SOLN Place 2 sprays into both nostrils 2 (two) times daily. Use in each nostril as directed     [provider]  calcitonin, salmon, (MIACALCIN) 200 UNIT/ACT nasal spray Place 1 spray into alternate nostrils daily.    [provider]  Cholecalciferol (VITAMIN D3 SUPER STRENGTH) 2000 units CAPS Take 2,000 Units by mouth daily.     [provider]  diltiazem (CARDIZEM CD) 240 MG 24 hr capsule Take 1 capsule (240 mg total) by mouth daily. 03/04/20   Mercy Riding, MD  donepezil (ARICEPT) 10 MG tablet Take 10 mg by mouth at bedtime.    [provider]  Ensure (ENSURE) Take 237 mLs by mouth 3 (three) times daily between meals.    [provider]  escitalopram (LEXAPRO) 20 MG tablet Take 20 mg by mouth daily.    [provider]  ferrous sulfate 325 (65 FE) MG EC tablet Take 1 tablet (325 mg total) by mouth in the morning and at bedtime. 03/04/20   Mercy Riding, MD  Fluticasone-Umeclidin-Vilant (TRELEGY ELLIPTA) 100-62.5-25 MCG/INH AEPB Inhale 1 puff into the lungs daily.    [provider]  furosemide (LASIX) 20 MG tablet Take 20 mg by mouth every other day.     [provider]  ipratropium-albuterol (DUONEB) 0.5-2.5 (3) MG/3ML SOLN Take 3 mLs by nebulization every 6 (six) hours as needed (Shortness of breath, cough or wheezing). 03/04/20   Mercy Riding, MD  lamoTRIgine (LAMICTAL) 100 MG tablet Take 1 tablet (100 mg total) by mouth 2 (two) times daily. 10/31/19   Marcial Pacas, MD  loratadine (CLARITIN) 10 MG tablet Take 10 mg by mouth daily.    [provider]  Melatonin 10 MG TABS Take 10 mg by mouth at bedtime.     [provider]  memantine (NAMENDA) 10 MG tablet Take 1 tablet (10 mg total) by mouth 2 (two) times daily. 09/21/19   Suzzanne Cloud, NP  Multiple Vitamin (MULTIVITAMIN) capsule Take 1 capsule  by mouth daily.    [provider]  rivaroxaban (XARELTO) 10 MG TABS tablet Take 1 tablet (10 mg total) by mouth daily. 12/22/19   Leonie Man, MD  senna-docusate (SENOKOT-S) 8.6-50 MG tablet Take 1 tablet by mouth 2 (two) times daily between meals as needed for mild constipation. 03/04/20   Mercy Riding, MD  Wheat Dextrin (BENEFIBER DRINK MIX PO) Take 10 mLs by mouth daily. Mix with water     [provider]    Physical Exam: Vitals:   05/28/20 1130 05/28/20 1145 05/28/20 1230 05/28/20 1300  BP: 128/68 127/84 (!) 132/57 (!) 122/47  Pulse: 75 73 71 74  Resp: (!) 22 (!) 21 19 (!) 21  Temp:      SpO2: 100% 100% 98% 97%    Constitutional: NAD, calm, comfortable Vitals:   05/28/20 1130 05/28/20 1145 05/28/20 1230 05/28/20 1300  BP: 128/68 127/84 (!) 132/57 (!) 122/47  Pulse: 75 73 71 74  Resp: (!) 22 (!) 21 19 (!) 21  Temp:      SpO2: 100% 100% 98% 97%   Eyes: PERRL, lids and conjunctivae normal ENMT: Mucous membranes are moist. Posterior pharynx clear of any exudate or lesions.Normal dentition.  Neck: normal, supple, no masses, no thyromegaly Respiratory: clear to auscultation bilaterally, no wheezing, no crackles. Normal respiratory effort. No accessory muscle use.  Cardiovascular: Regular rate  and rhythm, no murmurs / rubs / gallops. No extremity edema. 2+ pedal pulses. No carotid bruits.  Abdomen: no tenderness, no masses palpated. No hepatosplenomegaly. Bowel sounds positive.  Musculoskeletal: no clubbing / cyanosis. No joint deformity upper and lower extremities. Good ROM, no contractures. Normal muscle tone.  Skin: no rashes, lesions, ulcers. No induration Neurologic: CN 2-12 grossly intact. Sensation intact, DTR normal. Strength 5/5 in all 4.  Psychiatric: Normal judgment and insight. Alert and oriented x 3. Normal mood.     Labs on Admission: I have personally reviewed following labs and imaging studies  CBC: Recent Labs  Lab 05/28/20 1103  WBC 6.2  NEUTROABS 5.1  HGB 10.9*  HCT 35.0*  MCV 107.0*  PLT 245   Basic Metabolic Panel: Recent Labs  Lab 05/28/20 1103  NA 145  K 3.7  CL 110  CO2 22  GLUCOSE 102*  BUN 12  CREATININE 0.70  CALCIUM 9.2   GFR: CrCl cannot be calculated (Unknown ideal weight.). Liver Function Tests: Recent Labs  Lab 05/28/20 1103  AST 21  ALT 11  ALKPHOS 85  BILITOT 1.0  PROT 6.2*  ALBUMIN 3.6   Recent Labs  Lab 05/28/20 1103  LIPASE 32   No results for input(s): AMMONIA in the last 168 hours. Coagulation Profile: Recent Labs  Lab 05/28/20 1103  INR 2.3*   Cardiac Enzymes: No results for input(s): CKTOTAL, CKMB, CKMBINDEX, TROPONINI in the last 168 hours. BNP (last 3 results) No results for input(s): PROBNP in the last 8760 hours. HbA1C: No results for input(s): HGBA1C in the last 72 hours. CBG: No results for input(s): GLUCAP in the last 168 hours. Lipid Profile: No results for input(s): CHOL, HDL, LDLCALC, TRIG, CHOLHDL, LDLDIRECT in the last 72 hours. Thyroid Function Tests: No results for input(s): TSH, T4TOTAL, FREET4, T3FREE, THYROIDAB in the last 72 hours. Anemia Panel: No results for input(s): VITAMINB12, FOLATE, FERRITIN, TIBC, IRON, RETICCTPCT in the last 72 hours. Urine analysis:    Component  Value Date/Time   COLORURINE AMBER (A) 02/28/2020 1019   APPEARANCEUR CLOUDY (A) 02/28/2020 1019  LABSPEC 1.012 02/28/2020 1019   PHURINE 8.0 02/28/2020 1019   GLUCOSEU NEGATIVE 02/28/2020 1019   HGBUR NEGATIVE 02/28/2020 1019   BILIRUBINUR NEGATIVE 02/28/2020 1019   KETONESUR NEGATIVE 02/28/2020 1019   PROTEINUR NEGATIVE 02/28/2020 1019   UROBILINOGEN 0.2 07/07/2014 1451   NITRITE POSITIVE (A) 02/28/2020 1019   LEUKOCYTESUR NEGATIVE 02/28/2020 1019    Radiological Exams on Admission: No results found.  EKG: Independently reviewed.  Chronic A. fib, no acute ST changes.  Assessment/Plan Active Problems:   GI bleed   Lower GI bleed  (please populate well all problems here in Problem List. (For example, if patient is on BP meds at home and you resume or decide to hold them, it is a problem that needs to be her. Same for CAD, COPD, HLD and so on)  Lower GI bleed with subjective vaginal bleeding -Suspected radiation proctitis versus diverticulosis plus minus a rectal-vaginal fistula -Discussed with on-call radiology, recommend CT of abdomen pelvis with contrast via rectum to investigate possible rectum-vaginal fistula. -Repeat H&H tonight and tomorrow morning and transfuse if significant drop of hemoglobin or patient become symptomatic.  Admit to telemetry for close monitoring. -GI consulted. -Clear liquid diet -Hold Xarelto, given the recent no significant drop of hemoglobin or worrisome vital sign changes, will not neutralize Xarelto for now.  Chronic a-fib -Split Cardizem to every 6 hours -Hold Xarelto  COPD -No S/S of acute exacerbation  HTN -Blood pressure on higher side, continue home BP meds, including Lasix.  Mild dementia -Aricept and memantine  Anxiety/depression/mood disorder -Continue lamotrigine  DVT prophylaxis: SCD Code Status: DNR Family Communication: Daughter Disposition Plan: Expect more than 2 midnight hospital stay for GI work-up. Consults  called: GI Admission status: Tele admit   Lequita Halt MD [facility] Pager [phone]  05/28/2020, 1:21 PM

## 2020-05-28 NOTE — ED Notes (Signed)
Pt has been cleaned up and new linen has been provided and changed. New blankets were given.

## 2020-05-28 NOTE — Progress Notes (Signed)
CT abd reviewed, no fistula.

## 2020-05-28 NOTE — ED Notes (Signed)
Pt put in teletracking transport system

## 2020-05-29 DIAGNOSIS — K2971 Gastritis, unspecified, with bleeding: Secondary | ICD-10-CM | POA: Diagnosis not present

## 2020-05-29 DIAGNOSIS — F32A Depression, unspecified: Secondary | ICD-10-CM

## 2020-05-29 DIAGNOSIS — F419 Anxiety disorder, unspecified: Secondary | ICD-10-CM

## 2020-05-29 DIAGNOSIS — F0390 Unspecified dementia without behavioral disturbance: Secondary | ICD-10-CM | POA: Diagnosis not present

## 2020-05-29 LAB — BASIC METABOLIC PANEL
Anion gap: 9 (ref 5–15)
BUN: 9 mg/dL (ref 8–23)
CO2: 24 mmol/L (ref 22–32)
Calcium: 8.6 mg/dL — ABNORMAL LOW (ref 8.9–10.3)
Chloride: 108 mmol/L (ref 98–111)
Creatinine, Ser: 0.68 mg/dL (ref 0.44–1.00)
GFR, Estimated: >60 mL/min (ref >60)
Glucose, Bld: 86 mg/dL (ref 70–99)
Potassium: 3 mmol/L — ABNORMAL LOW (ref 3.5–5.1)
Sodium: 141 mmol/L (ref 135–145)

## 2020-05-29 LAB — CBC
HCT: 29.2 % — ABNORMAL LOW (ref 36.0–46.0)
Hemoglobin: 9.3 g/dL — ABNORMAL LOW (ref 12.0–15.0)
MCH: 32.5 pg (ref 26.0–34.0)
MCHC: 31.8 g/dL (ref 30.0–36.0)
MCV: 102.1 fL — ABNORMAL HIGH (ref 80.0–100.0)
Platelets: 212 10*3/uL (ref 150–400)
RBC: 2.86 MIL/uL — ABNORMAL LOW (ref 3.87–5.11)
RDW: 15.1 % (ref 11.5–15.5)
WBC: 2.9 10*3/uL — ABNORMAL LOW (ref 4.0–10.5)
nRBC: 0 % (ref 0.0–0.2)

## 2020-05-29 MED ORDER — POTASSIUM CHLORIDE CRYS ER 20 MEQ PO TBCR
40.0000 meq | EXTENDED_RELEASE_TABLET | ORAL | Status: AC
  Administered 2020-05-29 (×2): 40 meq via ORAL
  Filled 2020-05-29 (×2): qty 2

## 2020-05-29 NOTE — Evaluation (Signed)
Physical Therapy Evaluation Patient Details Name: Megan Munoz MRN: [id number] DOB: 02-Apr-1932 Today's Date: 05/29/2020   History of Present Illness  87yo female presenting with vaginal bleeding and lower GIB, urine and stool incontince. PMH Afib, CVA, COPD, dementia, heart failure, HLD, HTN, tremor, hx foot surgery  Clinical Impression   Patient received in bed with NT attending, pleasantly confused but cooperative with therapy. Needs max cues as well as hand over hand assist for sequencing and encouragement to pivot to chair. Easily fatigued. Deferred gait today due to fatigue and safety concerns. VSS on RA. Left up in recliner with all needs met, chair alarm active. Might be able to go home at [facility] level if family can give 24/7A/appropriate levels of physical assist, otherwise will need SNF.     Follow Up Recommendations Home health PT;Supervision/Assistance - 24 hour;Other (comment) (will need SNF if family cannot provide appropriate levels of support)    Equipment Recommendations  Rolling walker with 5" wheels;3in1 (PT);Wheelchair (measurements PT);Wheelchair cushion (measurements PT)    Recommendations for Other Services       Precautions / Restrictions Precautions Precautions: Fall;Other (comment) Precaution Comments: incontinent, bring brief Restrictions Weight Bearing Restrictions: No      Mobility  Bed Mobility Overal bed mobility: Needs Assistance Bed Mobility: Supine to Sit     Supine to sit: Min guard     General bed mobility comments: increased time and effort    Transfers Overall transfer level: Needs assistance Equipment used: Rolling walker (2 wheeled) Transfers: Sit to/from [facility] Sit to Stand: Mod assist;From elevated surface Stand pivot transfers: Mod assist       General transfer comment: heavy ModA to boost to upright standing and max cues/hand over hand assist for correct use of RW and coming upright posturally; short, unsteady  steps to pivot to recliner and fatigued  Ambulation/Gait             General Gait Details: unable- fatigue  Stairs            Wheelchair Mobility    Modified Rankin (Stroke Patients Only)       Balance Overall balance assessment: Needs assistance Sitting-balance support: Feet supported;Bilateral upper extremity supported Sitting balance-Leahy Scale: Good     Standing balance support: Bilateral upper extremity supported;During functional activity Standing balance-Leahy Scale: Poor Standing balance comment: forward flexed posture, reliant on BUE and external support                             Pertinent Vitals/Pain Pain Assessment: Faces Faces Pain Scale: No hurt Pain Intervention(s): Limited activity within patient's tolerance;Monitored during session    Home Living Family/patient expects to be discharged to:: Private residence Living Arrangements: Children Available Help at Discharge: Family;Available 24 hours/day Type of Home: House Home Access: Stairs to enter Entrance Stairs-Rails: None Entrance Stairs-Number of Steps: 1 after an uphill driveway per prior charting, patient states 5 STE no rails   Home Equipment: Walker - 2 wheels;Shower seat      Prior Function Level of Independence: Needs assistance   Gait / Transfers Assistance Needed: walks with walker, uses RW  ADL's / Homemaking Assistance Needed: Ind with ADLs/selfcare,  grandson makes meals for her  Comments: unreliable historian- will need to confirm home details with family     Hand Dominance        Extremity/Trunk Assessment   Upper Extremity Assessment Upper Extremity Assessment: Defer to OT evaluation  Lower Extremity Assessment Lower Extremity Assessment: Generalized weakness    Cervical / Trunk Assessment Cervical / Trunk Assessment: Kyphotic  Communication   Communication: No difficulties  Cognition Arousal/Alertness: Awake/alert Behavior During Therapy:  Flat affect Overall Cognitive Status: No family/caregiver present to determine baseline cognitive functioning Area of Impairment: Orientation;Attention;Memory;Following commands;Safety/judgement;Problem solving;Awareness                 Orientation Level: Disoriented to;Time Current Attention Level: Sustained Memory: Decreased recall of precautions;Decreased short-term memory Following Commands: Follows one step commands consistently;Follows one step commands with increased time Safety/Judgement: Decreased awareness of safety;Decreased awareness of deficits Awareness: Intellectual Problem Solving: Slow processing;Decreased initiation;Difficulty sequencing;Requires verbal cues;Requires tactile cues General Comments: slow processing and need for max cues for hand placement/sequencing      General Comments General comments (skin integrity, edema, etc.): VSS on RA    Exercises     Assessment/Plan    PT Assessment Patient needs continued PT services  PT Problem List Decreased strength;Decreased cognition;Decreased activity tolerance;Decreased safety awareness;Decreased balance;Decreased mobility       PT Treatment Interventions DME instruction;Balance training;Gait training;Stair training;Functional mobility training;Patient/family education;Therapeutic activities;Therapeutic exercise;Wheelchair mobility training    PT Goals (Current goals can be found in the Care Plan section)  Acute Rehab PT Goals Patient Stated Goal: get stronger PT Goal Formulation: With patient Time For Goal Achievement: 06/12/20 Potential to Achieve Goals: Good    Frequency Min 3X/week   Barriers to discharge        Co-evaluation               AM-PAC PT "6 Clicks" Mobility  Outcome Measure Help needed turning from your back to your side while in a flat bed without using bedrails?: A Little Help needed moving from lying on your back to sitting on the side of a flat bed without using  bedrails?: A Little Help needed moving to and from a bed to a chair (including a wheelchair)?: A Lot Help needed standing up from a chair using your arms (e.g., wheelchair or bedside chair)?: A Lot Help needed to walk in hospital room?: A Lot Help needed climbing 3-5 steps with a railing? : Total 6 Click Score: 13    End of Session Equipment Utilized During Treatment: Gait belt Activity Tolerance: Patient limited by fatigue;Patient tolerated treatment well Patient left: in chair;with call bell/phone within reach;with chair alarm set Nurse Communication: Mobility status PT Visit Diagnosis: Unsteadiness on feet (R26.81);Difficulty in walking, not elsewhere classified (R26.2);Muscle weakness (generalized) (M62.81)    Time: 1001-1035 PT Time Calculation (min) (ACUTE ONLY): 34 min   Charges:   PT Evaluation $PT Eval Moderate Complexity: 1 Mod PT Treatments $Therapeutic Activity: 8-22 mins       Windell Norfolk, DPT, PN1   Supplemental Physical Therapist [place]    Pager [phone] Acute Rehab Office [phone]

## 2020-05-29 NOTE — Assessment & Plan Note (Signed)
Continue Aricept and Namenda ?

## 2020-05-29 NOTE — Assessment & Plan Note (Addendum)
-  Given her history of radiation for rectal cancer, bleeding most likely consistent with her having developed chronic radiation proctitis.  No further bleeding episodes during hospitalization, hemoglobin has remained stable.  Xarelto resumed on 05/30/2020. -Seen by GI with no plans for intervention as well.  Can follow-up outpatient --Of note, CT on admission also negative for rectovaginal fistula.

## 2020-05-29 NOTE — Assessment & Plan Note (Signed)
-  Continue lamotrigine 

## 2020-05-29 NOTE — Plan of Care (Signed)

## 2020-05-29 NOTE — Hospital Course (Signed)
Ms. Lemire is an 87 yo female with PMH rectal cancer s/p radiation 2019, PAF on Xarelto, dementia, depression/anxiety, HLD, COPD who presented with concern for rectal bleeding vs vaginal bleeding.  She underwent CT abdomen/pelvis which was negative for a rectovaginal fistula.  GI was also consulted but patient did not require any intervention.  She is on Xarelto for A. fib which was held.  Hemoglobin was trended and remained relatively stable. She is typically incontinent of urine and stool and wears adult briefs.  Hemoglobin remained stable and she had no further episodes of bleeding noted from her rectum.  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  Given no significant drop in hemoglobin, her Xarelto was resumed.  She will follow up outpatient for ongoing monitoring for possible need for intervention versus medical treatment. Hemoglobin remained stable after resumption of Xarelto.  On day of discharge was 10.1 g/dL with no further reports of bleeding.

## 2020-05-29 NOTE — Assessment & Plan Note (Signed)
-   no s/s exacerbation 

## 2020-05-29 NOTE — Assessment & Plan Note (Addendum)
-  Xarelto resumed on 05/30/2020

## 2020-05-29 NOTE — Progress Notes (Signed)
PROGRESS NOTE    Megan Munoz   ZOX:[id number]  DOB: [birth year]  DOA: 05/28/2020     1  PCP: Fanny Bien, MD  CC: concern for GIB  Hospital Course: [name] is an 87 yo female with PMH rectal cancer s/p radiation 2019, PAF on Xarelto, dementia, depression/anxiety, HLD, COPD who presented with concern for rectal bleeding vs vaginal bleeding.  She underwent CT abdomen/pelvis which was negative for a rectovaginal fistula.  GI was also consulted but patient did not require any intervention.  She is on Xarelto for A. fib which was held.  Hemoglobin was trended and remained relatively stable. She is typically incontinent of urine and stool and wears adult briefs.   Interval History:  Sitting up in chair this am. Some obvious dementia noted at baseline. Unable to really provide much collateral information.   ROS: Review of systems not obtained due to patient factors. Cognitive impairment   Assessment & Plan: * GI bleed - possible radiation proctitis but Hgb drop has not been significant; CT negative for rectovaginal fistula; shows prior hysterectomy as well - continue holding Xarelto 1 more day and if hemoglobin remains stable tomorrow, will resume and probable discharge home -Appreciate GI evaluation; currently no plans for endoscopies  Permanent atrial fibrillation ([place]): CHA2DS2-VASc Score 7 - on Xarelto -Xarelto on hold for now -If hemoglobin stable tomorrow, will resume  Anxiety and depression -Continue lamotrigine  COPD (chronic obstructive pulmonary disease) with emphysema (HCC) - no s/s exacerbation   Dementia without behavioral disturbance (HCC) -Continue Aricept and Namenda  Essential hypertension -Continue current regimen    Old records reviewed in assessment of this patient  Antimicrobials: n/a  DVT prophylaxis: SCDs Start: 05/28/20 1320   Code Status:   Code Status: DNR Family Communication: none present   Disposition Plan: Status is:  Inpatient  Remains inpatient appropriate because:Inpatient level of care appropriate due to severity of illness   Dispo: The patient is from: Home              Anticipated d/c is to: Home              Patient currently is not medically stable to d/c.   Difficult to place patient No  Risk of unplanned readmission score: Unplanned Admission- Pilot do not use: 28.73   Objective: Blood pressure (!) 111/55, pulse 74, temperature 99.1 F (37.3 C), temperature source Oral, resp. rate 18, height 5\' 7"  (1.702 m), weight 53.6 kg, SpO2 97 %.  Examination: General appearance: alert, cooperative and no distress Head: Normocephalic, without obvious abnormality, atraumatic Eyes: EOMI Lungs: clear to auscultation bilaterally Heart: irregularly irregular rhythm and S1, S2 normal Abdomen: normal findings: bowel sounds normal and soft, non-tender Extremities: no edema Skin: mobility and turgor normal Neurologic: dementia noted; oriented to name, year  Consultants:   GI  Procedures:   n/a  Data Reviewed: I have personally reviewed following labs and imaging studies Results for orders placed or performed during the hospital encounter of 05/28/20 (from the past 24 hour(s))  Hemoglobin and hematocrit, blood     Status: Abnormal   Collection Time: 05/28/20  9:47 PM  Result Value Ref Range   Hemoglobin 9.4 (L) 12.0 - 15.0 g/dL   HCT 28.0 (L) 36.0 - 81.1 %  Basic metabolic panel     Status: Abnormal   Collection Time: 05/29/20  7:01 AM  Result Value Ref Range   Sodium 141 135 - 145 mmol/L   Potassium 3.0 (L) 3.5 -  5.1 mmol/L   Chloride 108 98 - 111 mmol/L   CO2 24 22 - 32 mmol/L   Glucose, Bld 86 70 - 99 mg/dL   BUN 9 8 - 23 mg/dL   Creatinine, Ser 0.68 0.44 - 1.00 mg/dL   Calcium 8.6 (L) 8.9 - 10.3 mg/dL   GFR, Estimated >60 >60 mL/min   Anion gap 9 5 - 15  CBC     Status: Abnormal   Collection Time: 05/29/20  7:01 AM  Result Value Ref Range   WBC 2.9 (L) 4.0 - 10.5 K/uL   RBC 2.86  (L) 3.87 - 5.11 MIL/uL   Hemoglobin 9.3 (L) 12.0 - 15.0 g/dL   HCT 29.2 (L) 36.0 - 46.0 %   MCV 102.1 (H) 80.0 - 100.0 fL   MCH 32.5 26.0 - 34.0 pg   MCHC 31.8 30.0 - 36.0 g/dL   RDW 15.1 11.5 - 15.5 %   Platelets 212 150 - 400 K/uL   nRBC 0.0 0.0 - 0.2 %    Recent Results (from the past 240 hour(s))  Resp Panel by RT-PCR (Flu A&B, Covid) Nasopharyngeal Swab     Status: None   Collection Time: 05/28/20 11:12 AM   Specimen: Nasopharyngeal Swab; Nasopharyngeal(NP) swabs in vial transport medium  Result Value Ref Range Status   SARS Coronavirus 2 by RT PCR NEGATIVE NEGATIVE Final    Comment: (NOTE) SARS-CoV-2 target nucleic acids are NOT DETECTED.  The SARS-CoV-2 RNA is generally detectable in upper respiratory specimens during the acute phase of infection. The lowest concentration of SARS-CoV-2 viral copies this assay can detect is 138 copies/mL. A negative result does not preclude SARS-Cov-2 infection and should not be used as the sole basis for treatment or other patient management decisions. A negative result may occur with  improper specimen collection/handling, submission of specimen other than nasopharyngeal swab, presence of viral mutation(s) within the areas targeted by this assay, and inadequate number of viral copies(<138 copies/mL). A negative result must be combined with clinical observations, patient history, and epidemiological information. The expected result is Negative.  Fact Sheet for Patients:  EntrepreneurPulse.com.au  Fact Sheet for Healthcare Providers:  IncredibleEmployment.be  This test is no t yet approved or cleared by the [place] FDA and  has been authorized for detection and/or diagnosis of SARS-CoV-2 by FDA under an Emergency Use Authorization (EUA). This EUA will remain  in effect (meaning this test can be used) for the duration of the COVID-19 declaration under Section 564(b)(1) of the Act,  21 U.S.C.section 360bbb-3(b)(1), unless the authorization is terminated  or revoked sooner.       Influenza A by PCR NEGATIVE NEGATIVE Final   Influenza B by PCR NEGATIVE NEGATIVE Final    Comment: (NOTE) The Xpert Xpress SARS-CoV-2/FLU/RSV plus assay is intended as an aid in the diagnosis of influenza from Nasopharyngeal swab specimens and should not be used as a sole basis for treatment. Nasal washings and aspirates are unacceptable for Xpert Xpress SARS-CoV-2/FLU/RSV testing.  Fact Sheet for Patients: EntrepreneurPulse.com.au  Fact Sheet for Healthcare Providers: IncredibleEmployment.be  This test is not yet approved or cleared by the [place] FDA and has been authorized for detection and/or diagnosis of SARS-CoV-2 by FDA under an Emergency Use Authorization (EUA). This EUA will remain in effect (meaning this test can be used) for the duration of the COVID-19 declaration under Section 564(b)(1) of the Act, 21 U.S.C. section 360bbb-3(b)(1), unless the authorization is terminated or revoked.  Performed at [facility], [place] [address], [place], [place] [address]      Radiology Studies: CT ABDOMEN PELVIS WO CONTRAST  Result Date: 05/28/2020 CLINICAL DATA:  Possible GI bleed. EXAM: CT ABDOMEN AND PELVIS WITHOUT CONTRAST TECHNIQUE: Multidetector CT imaging of the abdomen and pelvis was performed following the standard protocol without IV contrast. COMPARISON:  February 05, 2018 FINDINGS: Lower chest: No acute abnormality. Hepatobiliary: The liver is cirrhotic in appearance. A stable 7 mm cystic appearing area of low attenuation is seen within the posteromedial aspect of the right lobe of the liver (axial CT image 27, CT series number 3). No gallstones, gallbladder wall thickening, or biliary dilatation. Pancreas: Unremarkable. No pancreatic ductal dilatation or surrounding inflammatory changes. Spleen: Normal in size without focal  abnormality. Adrenals/Urinary Tract: Adrenal glands are unremarkable. Kidneys are normal, without renal calculi, focal lesion, or hydronephrosis. Bladder is unremarkable. Stomach/Bowel: There is a moderate-sized hiatal hernia. The appendix is not clearly identified. A large amount of stool is seen throughout the colon. No evidence of bowel wall thickening, distention, or inflammatory changes. Vascular/Lymphatic: Aortic atherosclerosis. No enlarged abdominal or pelvic lymph nodes. Reproductive: Status post hysterectomy. No adnexal masses. Other: No abdominal wall hernia or abnormality. No abdominopelvic ascites. Musculoskeletal: A chronic compression fracture deformity is seen at the level of L2. Grade 1 anterolisthesis of the L4 vertebral body is noted on L5, with grade 1 anterolisthesis of the L5 vertebral body noted on S1. IMPRESSION: 1. Cirrhosis. 2. Moderate-sized hiatal hernia. 3. Chronic compression fracture deformity of L2. 4. Grade 1 anterolisthesis of the L4 vertebral body on L5, with grade 1 anterolisthesis of the L5 vertebral body noted on S1. 5. Aortic atherosclerosis. Aortic Atherosclerosis (ICD10-I70.0). Electronically Signed   By: Virgina Norfolk M.D.   On: 05/28/2020 15:38   CT ABDOMEN PELVIS WO CONTRAST  Final Result      Scheduled Meds: . atorvastatin  20 mg Oral Daily  . azelastine  2 spray Each Nare BID  . calcitonin (salmon)  1 spray Alternating Nares Daily  . diltiazem  60 mg Oral Q6H  . donepezil  10 mg Oral QHS  . escitalopram  20 mg Oral Daily  . feeding supplement  237 mL Oral TID BM  . ferrous sulfate  325 mg Oral Q breakfast  . fluticasone furoate-vilanterol  1 puff Inhalation Daily   And  . umeclidinium bromide  1 puff Inhalation Daily  . furosemide  20 mg Oral QODAY  . lamoTRIgine  100 mg Oral BID  . loratadine  10 mg Oral Daily  . melatonin  10 mg Oral QHS  . memantine  10 mg Oral BID  . multivitamin with minerals  1 tablet Oral Daily  . psyllium  1 packet  Oral Daily   PRN Meds: acetaminophen, ipratropium-albuterol, senna-docusate Continuous Infusions:   LOS: 1 day  Time spent: Greater than 50% of the 35 minute visit was spent in counseling/coordination of care for the patient as laid out in the A&P.   Dwyane Dee, MD [facility] 05/29/2020, 1:27 PM

## 2020-05-29 NOTE — Assessment & Plan Note (Signed)
Continue current regimen

## 2020-05-29 NOTE — Progress Notes (Signed)
[facility] G[facility]ology Progress Note  Bayan Hedstrom 87 y.o. 1932/06/17   Subjective: Sitting in bedside chair. No BMs or bleeding reported. Denies abdominal pain.  Objective: Vital signs: Vitals:   05/29/20 0836 05/29/20 0958  BP:  (!) 111/55  Pulse:  74  Resp:  18  Temp:  99.1 F (37.3 C)  SpO2: 98% 97%    Physical Exam: Gen: alert, no acute distress, thin, elderly, pleasant HEENT: anicteric sclera CV: RRR Chest: CTA B Abd: minimal periumbilical tenderness without guarding, soft, nondistended, +BS  Lab Results: Recent Labs    05/28/20 1103 05/29/20 0701  NA 145 141  K 3.7 3.0*  CL 110 108  CO2 22 24  GLUCOSE 102* 86  BUN 12 9  CREATININE 0.70 0.68  CALCIUM 9.2 8.6*   Recent Labs    05/28/20 1103  AST 21  ALT 11  ALKPHOS 85  BILITOT 1.0  PROT 6.2*  ALBUMIN 3.6   Recent Labs    05/28/20 1103 05/28/20 2147 05/29/20 0701  WBC 6.2  --  2.9*  NEUTROABS 5.1  --   --   HGB 10.9* 9.4* 9.3*  HCT 35.0* 28.0* 29.2*  MCV 107.0*  --  102.1*  PLT 239  --  212      Assessment/Plan: Rectal bleeding has resolved. Question radiation proctitis vs stercoral ulcer vs diverticular bleed (less likely). Noncontrast CT negative for rectovaginal fistula. No bleeding overnight per nursing. Advance diet. Ok to resume Xarelto when deemed necessary by the primary team. Will sign off. Call if questions.   Lear Ng 05/29/2020, 11:59 AM  Questions please call [phone] ID: [name], female   DOB: [birth year], 87 y.o.   MRN: [id number]

## 2020-05-30 DIAGNOSIS — K922 Gastrointestinal hemorrhage, unspecified: Secondary | ICD-10-CM | POA: Diagnosis not present

## 2020-05-30 LAB — CBC WITH DIFFERENTIAL/PLATELET
Abs Immature Granulocytes: 0.01 10*3/uL (ref 0.00–0.07)
Basophils Absolute: 0 10*3/uL (ref 0.0–0.1)
Basophils Relative: 0 %
Eosinophils Absolute: 0 10*3/uL (ref 0.0–0.5)
Eosinophils Relative: 0 %
HCT: 30 % — ABNORMAL LOW (ref 36.0–46.0)
Hemoglobin: 9.9 g/dL — ABNORMAL LOW (ref 12.0–15.0)
Immature Granulocytes: 0 %
Lymphocytes Relative: 31 %
Lymphs Abs: 0.9 10*3/uL (ref 0.7–4.0)
MCH: 33.2 pg (ref 26.0–34.0)
MCHC: 33 g/dL (ref 30.0–36.0)
MCV: 100.7 fL — ABNORMAL HIGH (ref 80.0–100.0)
Monocytes Absolute: 0.5 10*3/uL (ref 0.1–1.0)
Monocytes Relative: 16 %
Neutro Abs: 1.6 10*3/uL — ABNORMAL LOW (ref 1.7–7.7)
Neutrophils Relative %: 53 %
Platelets: 208 10*3/uL (ref 150–400)
RBC: 2.98 MIL/uL — ABNORMAL LOW (ref 3.87–5.11)
RDW: 15.2 % (ref 11.5–15.5)
WBC: 3 10*3/uL — ABNORMAL LOW (ref 4.0–10.5)
nRBC: 0 % (ref 0.0–0.2)

## 2020-05-30 LAB — BASIC METABOLIC PANEL
Anion gap: 9 (ref 5–15)
BUN: 7 mg/dL — ABNORMAL LOW (ref 8–23)
CO2: 23 mmol/L (ref 22–32)
Calcium: 8.4 mg/dL — ABNORMAL LOW (ref 8.9–10.3)
Chloride: 109 mmol/L (ref 98–111)
Creatinine, Ser: 0.63 mg/dL (ref 0.44–1.00)
GFR, Estimated: >60 mL/min (ref >60)
Glucose, Bld: 104 mg/dL — ABNORMAL HIGH (ref 70–99)
Potassium: 3.5 mmol/L (ref 3.5–5.1)
Sodium: 141 mmol/L (ref 135–145)

## 2020-05-30 LAB — MAGNESIUM: Magnesium: 1.9 mg/dL (ref 1.7–2.4)

## 2020-05-30 MED ORDER — RIVAROXABAN 10 MG PO TABS
10.0000 mg | ORAL_TABLET | Freq: Every day | ORAL | Status: DC
  Administered 2020-05-30 – 2020-05-31 (×2): 10 mg via ORAL
  Filled 2020-05-30 (×2): qty 1

## 2020-05-30 NOTE — Plan of Care (Signed)

## 2020-05-30 NOTE — Progress Notes (Signed)
PROGRESS NOTE    Megan Munoz   OZH:[id number]  DOB: [birth year]  DOA: 05/28/2020     2  PCP: Megan Bien, MD  CC: concern for GIB  Hospital Course: [name] is an 87 yo female with PMH rectal cancer s/p radiation 2019, PAF on Xarelto, dementia, depression/anxiety, HLD, COPD who presented with concern for rectal bleeding vs vaginal bleeding.  She underwent CT abdomen/pelvis which was negative for a rectovaginal fistula.  GI was also consulted but patient did not require any intervention.  She is on Xarelto for A. fib which was held.  Hemoglobin was trended and remained relatively stable. She is typically incontinent of urine and stool and wears adult briefs.  Hemoglobin remained stable and she had no further episodes of bleeding noted from her rectum.  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  Given no significant drop in hemoglobin, her Xarelto was resumed.  She will follow up outpatient for ongoing monitoring for possible need for intervention versus medical treatment.   Interval History:  No events overnight.  Resting in bed comfortable this morning.  No further bleeding since admission.  Also performed exam while aides were in room.  Called and spoke to her daughter after seeing patient.  We will plan for patient returning home with family tomorrow.  Updated and answer daughter's questions.  ROS: Review of systems not obtained due to patient factors. Cognitive impairment   Assessment & Plan: * GI bleed-resolved as of 05/30/2020 -Given her history of radiation for rectal cancer, bleeding most likely consistent with her having developed chronic radiation proctitis.  No further bleeding episodes during hospitalization, hemoglobin has remained stable.  Xarelto resumed on 05/30/2020. -Seen by GI with no plans for intervention as well.  Can follow-up outpatient --Of note, CT on admission also negative for rectovaginal fistula.  Permanent atrial fibrillation ([place]):  CHA2DS2-VASc Score 7 - on Xarelto -Xarelto resumed on 05/30/2020  Anxiety and depression -Continue lamotrigine  COPD (chronic obstructive pulmonary disease) with emphysema (HCC) - no s/s exacerbation   Dementia without behavioral disturbance (HCC) -Continue Aricept and Namenda  Essential hypertension -Continue current regimen   Old records reviewed in assessment of this patient  Antimicrobials: n/a  DVT prophylaxis: rivaroxaban (XARELTO) tablet 10 mg Start: 05/30/20 1000 SCDs Start: 05/28/20 1320 rivaroxaban (XARELTO) tablet 10 mg   Code Status:   Code Status: DNR Family Communication: Daughter on phone  Disposition Plan: Status is: Inpatient  Remains inpatient appropriate because:Inpatient level of care appropriate due to severity of illness   Dispo: The patient is from: Home              Anticipated d/c is to: Home              Patient currently is not medically stable to d/c.   Difficult to place patient No  Risk of unplanned readmission score: Unplanned Admission- Pilot do not use: 29.35   Objective: Blood pressure (!) 115/47, pulse (!) 57, temperature 98.6 F (37 C), resp. rate 16, height 5\' 7"  (1.702 m), weight 53.6 kg, SpO2 98 %.  Examination: General appearance: alert, cooperative and no distress Head: Normocephalic, without obvious abnormality, atraumatic Eyes: EOMI Lungs: clear to auscultation bilaterally Heart: irregularly irregular rhythm and S1, S2 normal Abdomen: normal findings: bowel sounds normal and soft, non-tender  GU: No blood noted in rectal vault nor vaginal orifice Extremities: no edema Skin: mobility and turgor normal Neurologic: dementia noted; oriented to name, year  Consultants:   GI  Procedures:  n/a  Data Reviewed: I have personally reviewed following labs and imaging studies Results for orders placed or performed during the hospital encounter of 05/28/20 (from the past 24 hour(s))  Basic metabolic panel     Status:  Abnormal   Collection Time: 05/30/20  2:47 AM  Result Value Ref Range   Sodium 141 135 - 145 mmol/L   Potassium 3.5 3.5 - 5.1 mmol/L   Chloride 109 98 - 111 mmol/L   CO2 23 22 - 32 mmol/L   Glucose, Bld 104 (H) 70 - 99 mg/dL   BUN 7 (L) 8 - 23 mg/dL   Creatinine, Ser 0.63 0.44 - 1.00 mg/dL   Calcium 8.4 (L) 8.9 - 10.3 mg/dL   GFR, Estimated >60 >60 mL/min   Anion gap 9 5 - 15  CBC with Differential/Platelet     Status: Abnormal   Collection Time: 05/30/20  2:47 AM  Result Value Ref Range   WBC 3.0 (L) 4.0 - 10.5 K/uL   RBC 2.98 (L) 3.87 - 5.11 MIL/uL   Hemoglobin 9.9 (L) 12.0 - 15.0 g/dL   HCT 30.0 (L) 36.0 - 46.0 %   MCV 100.7 (H) 80.0 - 100.0 fL   MCH 33.2 26.0 - 34.0 pg   MCHC 33.0 30.0 - 36.0 g/dL   RDW 15.2 11.5 - 15.5 %   Platelets 208 150 - 400 K/uL   nRBC 0.0 0.0 - 0.2 %   Neutrophils Relative % 53 %   Neutro Abs 1.6 (L) 1.7 - 7.7 K/uL   Lymphocytes Relative 31 %   Lymphs Abs 0.9 0.7 - 4.0 K/uL   Monocytes Relative 16 %   Monocytes Absolute 0.5 0.1 - 1.0 K/uL   Eosinophils Relative 0 %   Eosinophils Absolute 0.0 0.0 - 0.5 K/uL   Basophils Relative 0 %   Basophils Absolute 0.0 0.0 - 0.1 K/uL   Immature Granulocytes 0 %   Abs Immature Granulocytes 0.01 0.00 - 0.07 K/uL  Magnesium     Status: None   Collection Time: 05/30/20  2:47 AM  Result Value Ref Range   Magnesium 1.9 1.7 - 2.4 mg/dL    Recent Results (from the past 240 hour(s))  Resp Panel by RT-PCR (Flu A&B, Covid) Nasopharyngeal Swab     Status: None   Collection Time: 05/28/20 11:12 AM   Specimen: Nasopharyngeal Swab; Nasopharyngeal(NP) swabs in vial transport medium  Result Value Ref Range Status   SARS Coronavirus 2 by RT PCR NEGATIVE NEGATIVE Final    Comment: (NOTE) SARS-CoV-2 target nucleic acids are NOT DETECTED.  The SARS-CoV-2 RNA is generally detectable in upper respiratory specimens during the acute phase of infection. The lowest concentration of SARS-CoV-2 viral copies this assay can  detect is 138 copies/mL. A negative result does not preclude SARS-Cov-2 infection and should not be used as the sole basis for treatment or other patient management decisions. A negative result may occur with  improper specimen collection/handling, submission of specimen other than nasopharyngeal swab, presence of viral mutation(s) within the areas targeted by this assay, and inadequate number of viral copies(<138 copies/mL). A negative result must be combined with clinical observations, patient history, and epidemiological information. The expected result is Negative.  Fact Sheet for Patients:  EntrepreneurPulse.com.au  Fact Sheet for Healthcare Providers:  IncredibleEmployment.be  This test is no t yet approved or cleared by the [place] FDA and  has been authorized for detection and/or diagnosis of SARS-CoV-2 by FDA under an Emergency Use Authorization (EUA).  This EUA will remain  in effect (meaning this test can be used) for the duration of the COVID-19 declaration under Section 564(b)(1) of the Act, 21 U.S.C.section 360bbb-3(b)(1), unless the authorization is terminated  or revoked sooner.       Influenza A by PCR NEGATIVE NEGATIVE Final   Influenza B by PCR NEGATIVE NEGATIVE Final    Comment: (NOTE) The Xpert Xpress SARS-CoV-2/FLU/RSV plus assay is intended as an aid in the diagnosis of influenza from Nasopharyngeal swab specimens and should not be used as a sole basis for treatment. Nasal washings and aspirates are unacceptable for Xpert Xpress SARS-CoV-2/FLU/RSV testing.  Fact Sheet for Patients: EntrepreneurPulse.com.au  Fact Sheet for Healthcare Providers: IncredibleEmployment.be  This test is not yet approved or cleared by the [place] FDA and has been authorized for detection and/or diagnosis of SARS-CoV-2 by FDA under an Emergency Use Authorization (EUA). This EUA will remain in  effect (meaning this test can be used) for the duration of the COVID-19 declaration under Section 564(b)(1) of the Act, 21 U.S.C. section 360bbb-3(b)(1), unless the authorization is terminated or revoked.  Performed at [facility], [place] [address], [place], [place] [address]      Radiology Studies: CT ABDOMEN PELVIS WO CONTRAST  Result Date: 05/28/2020 CLINICAL DATA:  Possible GI bleed. EXAM: CT ABDOMEN AND PELVIS WITHOUT CONTRAST TECHNIQUE: Multidetector CT imaging of the abdomen and pelvis was performed following the standard protocol without IV contrast. COMPARISON:  February 05, 2018 FINDINGS: Lower chest: No acute abnormality. Hepatobiliary: The liver is cirrhotic in appearance. A stable 7 mm cystic appearing area of low attenuation is seen within the posteromedial aspect of the right lobe of the liver (axial CT image 27, CT series number 3). No gallstones, gallbladder wall thickening, or biliary dilatation. Pancreas: Unremarkable. No pancreatic ductal dilatation or surrounding inflammatory changes. Spleen: Normal in size without focal abnormality. Adrenals/Urinary Tract: Adrenal glands are unremarkable. Kidneys are normal, without renal calculi, focal lesion, or hydronephrosis. Bladder is unremarkable. Stomach/Bowel: There is a moderate-sized hiatal hernia. The appendix is not clearly identified. A large amount of stool is seen throughout the colon. No evidence of bowel wall thickening, distention, or inflammatory changes. Vascular/Lymphatic: Aortic atherosclerosis. No enlarged abdominal or pelvic lymph nodes. Reproductive: Status post hysterectomy. No adnexal masses. Other: No abdominal wall hernia or abnormality. No abdominopelvic ascites. Musculoskeletal: A chronic compression fracture deformity is seen at the level of L2. Grade 1 anterolisthesis of the L4 vertebral body is noted on L5, with grade 1 anterolisthesis of the L5 vertebral body noted on S1. IMPRESSION: 1. Cirrhosis. 2.  Moderate-sized hiatal hernia. 3. Chronic compression fracture deformity of L2. 4. Grade 1 anterolisthesis of the L4 vertebral body on L5, with grade 1 anterolisthesis of the L5 vertebral body noted on S1. 5. Aortic atherosclerosis. Aortic Atherosclerosis (ICD10-I70.0). Electronically Signed   By: Virgina Norfolk M.D.   On: 05/28/2020 15:38   CT ABDOMEN PELVIS WO CONTRAST  Final Result      Scheduled Meds: . atorvastatin  20 mg Oral Daily  . azelastine  2 spray Each Nare BID  . calcitonin (salmon)  1 spray Alternating Nares Daily  . diltiazem  60 mg Oral Q6H  . donepezil  10 mg Oral QHS  . escitalopram  20 mg Oral Daily  . feeding supplement  237 mL Oral TID BM  . ferrous sulfate  325 mg Oral Q breakfast  . fluticasone furoate-vilanterol  1 puff Inhalation Daily   And  . umeclidinium  bromide  1 puff Inhalation Daily  . furosemide  20 mg Oral QODAY  . lamoTRIgine  100 mg Oral BID  . loratadine  10 mg Oral Daily  . melatonin  10 mg Oral QHS  . memantine  10 mg Oral BID  . multivitamin with minerals  1 tablet Oral Daily  . psyllium  1 packet Oral Daily  . rivaroxaban  10 mg Oral Daily   PRN Meds: acetaminophen, ipratropium-albuterol, senna-docusate Continuous Infusions:   LOS: 2 days  Time spent: Greater than 50% of the 35 minute visit was spent in counseling/coordination of care for the patient as laid out in the A&P.   Dwyane Dee, MD [facility] 05/30/2020, 12:53 PM

## 2020-05-31 DIAGNOSIS — K922 Gastrointestinal hemorrhage, unspecified: Secondary | ICD-10-CM | POA: Diagnosis not present

## 2020-05-31 DIAGNOSIS — K627 Radiation proctitis: Principal | ICD-10-CM

## 2020-05-31 LAB — CBC WITH DIFFERENTIAL/PLATELET
Abs Immature Granulocytes: 0.02 10*3/uL (ref 0.00–0.07)
Basophils Absolute: 0 10*3/uL (ref 0.0–0.1)
Basophils Relative: 0 %
Eosinophils Absolute: 0 10*3/uL (ref 0.0–0.5)
Eosinophils Relative: 0 %
HCT: 31.8 % — ABNORMAL LOW (ref 36.0–46.0)
Hemoglobin: 10.1 g/dL — ABNORMAL LOW (ref 12.0–15.0)
Immature Granulocytes: 1 %
Lymphocytes Relative: 33 %
Lymphs Abs: 1.2 10*3/uL (ref 0.7–4.0)
MCH: 33.1 pg (ref 26.0–34.0)
MCHC: 31.8 g/dL (ref 30.0–36.0)
MCV: 104.3 fL — ABNORMAL HIGH (ref 80.0–100.0)
Monocytes Absolute: 0.4 10*3/uL (ref 0.1–1.0)
Monocytes Relative: 11 %
Neutro Abs: 2 10*3/uL (ref 1.7–7.7)
Neutrophils Relative %: 55 %
Platelets: 221 10*3/uL (ref 150–400)
RBC: 3.05 MIL/uL — ABNORMAL LOW (ref 3.87–5.11)
RDW: 15 % (ref 11.5–15.5)
WBC: 3.6 10*3/uL — ABNORMAL LOW (ref 4.0–10.5)
nRBC: 0 % (ref 0.0–0.2)

## 2020-05-31 LAB — BASIC METABOLIC PANEL
Anion gap: 8 (ref 5–15)
BUN: 11 mg/dL (ref 8–23)
CO2: 25 mmol/L (ref 22–32)
Calcium: 8.9 mg/dL (ref 8.9–10.3)
Chloride: 107 mmol/L (ref 98–111)
Creatinine, Ser: 0.74 mg/dL (ref 0.44–1.00)
GFR, Estimated: >60 mL/min (ref >60)
Glucose, Bld: 90 mg/dL (ref 70–99)
Potassium: 4.6 mmol/L (ref 3.5–5.1)
Sodium: 140 mmol/L (ref 135–145)

## 2020-05-31 LAB — MAGNESIUM: Magnesium: 2 mg/dL (ref 1.7–2.4)

## 2020-05-31 MED ORDER — DILTIAZEM HCL ER COATED BEADS 120 MG PO CP24: 120.0000 mg | ORAL_CAPSULE | Freq: Every day | ORAL | 3 refills | Status: AC

## 2020-05-31 NOTE — Progress Notes (Signed)
Physical Therapy Treatment Patient Details Name: Megan Munoz MRN: [id number] DOB: 07-Jun-1932 Today's Date: 05/31/2020    History of Present Illness 87yo female presenting with vaginal bleeding and lower GIB, urine and stool incontince. PMH Afib, CVA, COPD, dementia, heart failure, HLD, HTN, tremor, hx foot surgery    PT Comments    Pt received in supine, agreeable to therapy session with encouragement and with good participation and fair tolerance for mobility. Pt fatigues quickly and needs frequent safety cues but improved standing from chair this date (minA from [place] prior needed) but continues to needs modA to stand from bed height and pivoting steps toward chair with RW. Pt incontinent and given briefs to don, poor standing balance and needs hygiene assist in stance at RW. Pt up in chair with alarm on for safety awaiting family for discharge. Pt continues to benefit from PT services to progress toward functional mobility goals. Continue to recommend HHPT if supervision/assist for all mobility is available.   Follow Up Recommendations  Home health PT;Supervision/Assistance - 24 hour;Other (comment) (will need SNF if family cannot provide 24/7 supervision/assist)     Equipment Recommendations  Rolling walker with 5" wheels;3in1 (PT);Wheelchair (measurements PT);Wheelchair cushion (measurements PT) would benefit from ramp if needed (pt reports 5 STE but per chart review only 1 step to enter from driveway?)   Recommendations for Other Services       Precautions / Restrictions Precautions Precautions: Fall;Other (comment) Precaution Comments: incontinent, bring brief Restrictions Weight Bearing Restrictions: No    Mobility  Bed Mobility Overal bed mobility: Needs Assistance Bed Mobility: Supine to Sit;Rolling Rolling: Modified independent (Device/Increase time)   Supine to sit: Min guard     General bed mobility comments: increased time and effort, cues for self-assist and  sequencing    Transfers Overall transfer level: Needs assistance Equipment used: Rolling walker (2 wheeled) Transfers: Sit to/from [facility] Sit to Stand: Mod assist;Min assist Stand pivot transfers: Mod assist       General transfer comment: from EOB>RW with modA, from chair to RW with minA, cues needed for upright posture upon standing; SPT to chair ~62ft using RW and modA due to posterior lean, short/shuffled steps toward R to chair; impulsive to sit once at chair  Ambulation/Gait             General Gait Details: unable- fatigue and wanting to wait for family, upcoming discharge   Stairs             Wheelchair Mobility    Modified Rankin (Stroke Patients Only)       Balance Overall balance assessment: Needs assistance Sitting-balance support: Feet supported;Bilateral upper extremity supported Sitting balance-Leahy Scale: Fair Sitting balance - Comments: rounded shoulder posture, reliant on UE for support   Standing balance support: Bilateral upper extremity supported;During functional activity Standing balance-Leahy Scale: Poor Standing balance comment: forward flexed posture, reliant on BUE and external support, posterior lean increased with fatigue                            Cognition Arousal/Alertness: Awake/alert Behavior During Therapy: WFL for tasks assessed/performed Overall Cognitive Status: No family/caregiver present to determine baseline cognitive functioning Area of Impairment: Orientation;Attention;Memory;Following commands;Safety/judgement;Problem solving;Awareness                 Orientation Level: Disoriented to;Time Current Attention Level: Sustained Memory: Decreased recall of precautions;Decreased short-term memory Following Commands: Follows one step commands consistently;Follows one step commands  with increased time Safety/Judgement: Decreased awareness of safety;Decreased awareness of  deficits Awareness: Intellectual Problem Solving: Slow processing;Decreased initiation;Difficulty sequencing;Requires verbal cues;Requires tactile cues General Comments: slow processing and needs repetitive for hand placement/sequencing      Exercises      General Comments General comments (skin integrity, edema, etc.): pt bed noted to be saturated in urine on staff arrival to room and purewick not properly in place, pt needed hygiene assist once standing at RW due to poor standing balance and given briefs to don while sitting up in chair awaiting family arrival for DC. RN notified pt still has IV in [place] which will need to come out. VSS on RA, HR 90's bpm with stand pivot transfer      Pertinent Vitals/Pain Pain Assessment: No/denies pain Faces Pain Scale: No hurt Pain Intervention(s): Monitored during session;Repositioned    Home Living                      Prior Function            PT Goals (current goals can now be found in the care plan section) Acute Rehab PT Goals Patient Stated Goal: get stronger PT Goal Formulation: With patient Time For Goal Achievement: 06/12/20 Potential to Achieve Goals: Good Progress towards PT goals: Progressing toward goals    Frequency    Min 3X/week      PT Plan Current plan remains appropriate    Co-evaluation              AM-PAC PT "6 Clicks" Mobility   Outcome Measure  Help needed turning from your back to your side while in a flat bed without using bedrails?: None Help needed moving from lying on your back to sitting on the side of a flat bed without using bedrails?: A Little Help needed moving to and from a bed to a chair (including a wheelchair)?: A Lot Help needed standing up from a chair using your arms (e.g., wheelchair or bedside chair)?: A Lot Help needed to walk in hospital room?: A Lot Help needed climbing 3-5 steps with a railing? : Total 6 Click Score: 14    End of Session Equipment Utilized During  Treatment: Gait belt Activity Tolerance: Patient limited by fatigue;Patient tolerated treatment well Patient left: in chair;with call bell/phone within reach;with chair alarm set Nurse Communication: Mobility status PT Visit Diagnosis: Unsteadiness on feet (R26.81);Difficulty in walking, not elsewhere classified (R26.2);Muscle weakness (generalized) (M62.81)     Time: 7035-0093 PT Time Calculation (min) (ACUTE ONLY): 24 min  Charges:  $Therapeutic Activity: 23-37 mins                     Ezio Wieck P., PTA Acute Rehabilitation Services Pager: [phone] Office: [place] 05/31/2020, 2:30 PM

## 2020-05-31 NOTE — Assessment & Plan Note (Signed)
-  Per daughter has been approximately 2 years since radiation.  Given rectal bleeding now starting to become an issue, she may be developing chronic radiation proctitis. -No further bleeding during hospitalization and hemoglobin remained stable.  She has been evaluated by GI as well -Can follow-up outpatient with GI, especially if further bleeding or for medical management

## 2020-05-31 NOTE — Discharge Summary (Signed)
Physician Discharge Summary   Megan Munoz MCN:[id number] DOB: Aug 14, 1932 DOA: 05/28/2020  PCP: Fanny Bien, MD  Admit date: 05/28/2020 Discharge date:  05/31/2020  Admitted From: home Disposition:  home Discharging physician: Dwyane Dee, MD  Recommendations for Outpatient Follow-up:  1. Follow up with GI if further bleeding  Patient discharged to home in Discharge Condition: stable Risk of unplanned readmission score: Unplanned Admission- [place] do not use: 23.65  CODE STATUS: DNR Diet recommendation:  Diet Orders (From admission, onward)    Start     Ordered   05/31/20 0000  Diet - low sodium heart healthy        05/31/20 1003   05/29/20 1210  Diet Heart Room service appropriate? Yes; Fluid consistency: Thin  Diet effective now       Question Answer Comment  Room service appropriate? Yes   Fluid consistency: Thin      05/29/20 1210          Hospital Course: Megan Munoz is an 87 yo female with PMH rectal cancer s/p radiation 2019, PAF on Xarelto, dementia, depression/anxiety, HLD, COPD who presented with concern for rectal bleeding vs vaginal bleeding.  She underwent CT abdomen/pelvis which was negative for a rectovaginal fistula.  GI was also consulted but patient did not require any intervention.  She is on Xarelto for A. fib which was held.  Hemoglobin was trended and remained relatively stable. She is typically incontinent of urine and stool and wears adult briefs.  Hemoglobin remained stable and she had no further episodes of bleeding noted from her rectum.  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  Given no significant drop in hemoglobin, her Xarelto was resumed.  She will follow up outpatient for ongoing monitoring for possible need for intervention versus medical treatment. Hemoglobin remained stable after resumption of Xarelto.  On day of discharge was 10.1 g/dL with no further reports of bleeding.   * GI bleed-resolved as of 05/30/2020 -Given  her history of radiation for rectal cancer, bleeding most likely consistent with her having developed chronic radiation proctitis.  No further bleeding episodes during hospitalization, hemoglobin has remained stable.  Xarelto resumed on 05/30/2020. -Seen by GI with no plans for intervention as well.  Can follow-up outpatient --Of note, CT on admission also negative for rectovaginal fistula.  Permanent atrial fibrillation ([place]): CHA2DS2-VASc Score 7 - on Xarelto -Xarelto resumed on 05/30/2020  Radiation proctitis -Per daughter has been approximately 2 years since radiation.  Given rectal bleeding now starting to become an issue, she may be developing chronic radiation proctitis. -No further bleeding during hospitalization and hemoglobin remained stable.  She has been evaluated by GI as well -Can follow-up outpatient with GI, especially if further bleeding or for medical management  Anxiety and depression -Continue lamotrigine  COPD (chronic obstructive pulmonary disease) with emphysema (HCC) - no s/s exacerbation   Dementia without behavioral disturbance ([place]) -Continue Aricept and Namenda  Essential hypertension -Continue current regimen    The patient's chronic medical conditions were treated accordingly per the patient's home medication regimen except as noted.  On day of discharge, patient was felt deemed stable for discharge. Patient/family member advised to call PCP or come back to ER if needed.   Principal Diagnosis: GI bleed  Discharge Diagnoses: Active Hospital Problems   Diagnosis Date Noted  . Permanent atrial fibrillation ([place]): CHA2DS2-VASc Score 7 - on Xarelto 07/07/2014    Priority: Medium  . Radiation proctitis 05/31/2020  . Anxiety and depression 05/29/2020  .  COPD (chronic obstructive pulmonary disease) with emphysema ([place]) 02/03/2018  . Dementia without behavioral disturbance ([place]) 07/21/2016  . Essential hypertension 07/22/2014    Resolved Hospital Problems    Diagnosis Date Noted Date Resolved  . GI bleed  05/30/2020    Priority: High    Discharge Instructions    Diet - low sodium heart healthy   Complete by: As directed    Increase activity slowly   Complete by: As directed      Allergies as of 05/31/2020      Reactions   Topiramate Other (See Comments)   Drastic cognitive change, diarrhea, weight loss   Tetanus Toxoids Other (See Comments)   Told never to take       Medication List    STOP taking these medications   nitrofurantoin 100 MG capsule Commonly known as: MACRODANTIN     TAKE these medications   acetaminophen 500 MG tablet Commonly known as: TYLENOL Take 1,000 mg by mouth every 6 (six) hours as needed for mild pain, fever or headache.   atorvastatin 20 MG tablet Commonly known as: LIPITOR Take 1 tablet (20 mg total) by mouth daily.   Azelastine HCl 137 MCG/SPRAY Soln Place 2 sprays into both nostrils 2 (two) times daily. Use in each nostril as directed   BENEFIBER DRINK MIX PO Take 10 mLs by mouth daily. Mix with water   calcitonin (salmon) 200 UNIT/ACT nasal spray Commonly known as: MIACALCIN/FORTICAL Place 1 spray into alternate nostrils daily.   CEROVITE PO Take 1 tablet by mouth daily.   Cholecalciferol 50 MCG (2000 UT) Caps Take 2,000 Units by mouth daily.   diltiazem 120 MG 24 hr capsule Commonly known as: CARDIZEM CD Take 1 capsule (120 mg total) by mouth daily. What changed:   medication strength  how much to take   donepezil 10 MG tablet Commonly known as: ARICEPT Take 10 mg by mouth at bedtime.   escitalopram 20 MG tablet Commonly known as: LEXAPRO Take 20 mg by mouth daily.   ferrous sulfate 325 (65 FE) MG EC tablet Take 1 tablet (325 mg total) by mouth in the morning and at bedtime.   Fluticasone-Umeclidin-Vilant 100-62.5-25 MCG/INH Aepb Inhale 1 puff into the lungs daily.   furosemide 20 MG tablet Commonly known as: LASIX Take 20 mg by mouth every other day.    ipratropium-albuterol 0.5-2.5 (3) MG/3ML Soln Commonly known as: DUONEB Take 3 mLs by nebulization every 6 (six) hours as needed (Shortness of breath, cough or wheezing).   lamoTRIgine 100 MG tablet Commonly known as: LaMICtal Take 1 tablet (100 mg total) by mouth 2 (two) times daily.   loratadine 10 MG tablet Commonly known as: CLARITIN Take 10 mg by mouth daily.   Melatonin 10 MG Tabs Take 10 mg by mouth at bedtime.   memantine 10 MG tablet Commonly known as: Namenda Take 1 tablet (10 mg total) by mouth 2 (two) times daily.   rivaroxaban 10 MG Tabs tablet Commonly known as: XARELTO Take 1 tablet (10 mg total) by mouth daily.   senna-docusate 8.6-50 MG tablet Commonly known as: Senokot-S Take 1 tablet by mouth 2 (two) times daily between meals as needed for mild constipation.       Allergies  Allergen Reactions  . Topiramate Other (See Comments)    Drastic cognitive change, diarrhea, weight loss  . Tetanus Toxoids Other (See Comments)    Told never to take     Consultations: GI  Discharge Exam: BP (!) 114/49 (  BP Location: Left Arm)   Pulse (!) 55   Temp 97.9 F (36.6 C)   Resp 18   Ht 5\' 7"  (1.702 m)   Wt 53.6 kg   SpO2 97%   BMI 18.51 kg/m  General appearance: alert, cooperative and no distress Head: Normocephalic, without obvious abnormality, atraumatic Eyes: EOMI Lungs: clear to auscultation bilaterally Heart: irregularly irregular rhythm and S1, S2 normal Abdomen: normal findings: bowel sounds normal and soft, non-tender  Extremities: no edema Skin: mobility and turgor normal Neurologic: dementia noted; oriented to name, year  The results of significant diagnostics from this hospitalization (including imaging, microbiology, ancillary and laboratory) are listed below for reference.   Microbiology: Recent Results (from the past 240 hour(s))  Resp Panel by RT-PCR (Flu A&B, Covid) Nasopharyngeal Swab     Status: None   Collection Time: 05/28/20  11:12 AM   Specimen: Nasopharyngeal Swab; Nasopharyngeal(NP) swabs in vial transport medium  Result Value Ref Range Status   SARS Coronavirus 2 by RT PCR NEGATIVE NEGATIVE Final    Comment: (NOTE) SARS-CoV-2 target nucleic acids are NOT DETECTED.  The SARS-CoV-2 RNA is generally detectable in upper respiratory specimens during the acute phase of infection. The lowest concentration of SARS-CoV-2 viral copies this assay can detect is 138 copies/mL. A negative result does not preclude SARS-Cov-2 infection and should not be used as the sole basis for treatment or other patient management decisions. A negative result may occur with  improper specimen collection/handling, submission of specimen other than nasopharyngeal swab, presence of viral mutation(s) within the areas targeted by this assay, and inadequate number of viral copies(<138 copies/mL). A negative result must be combined with clinical observations, patient history, and epidemiological information. The expected result is Negative.  Fact Sheet for Patients:  EntrepreneurPulse.com.au  Fact Sheet for Healthcare Providers:  IncredibleEmployment.be  This test is no t yet approved or cleared by the [place] FDA and  has been authorized for detection and/or diagnosis of SARS-CoV-2 by FDA under an Emergency Use Authorization (EUA). This EUA will remain  in effect (meaning this test can be used) for the duration of the COVID-19 declaration under Section 564(b)(1) of the Act, 21 U.S.C.section 360bbb-3(b)(1), unless the authorization is terminated  or revoked sooner.       Influenza A by PCR NEGATIVE NEGATIVE Final   Influenza B by PCR NEGATIVE NEGATIVE Final    Comment: (NOTE) The Xpert Xpress SARS-CoV-2/FLU/RSV plus assay is intended as an aid in the diagnosis of influenza from Nasopharyngeal swab specimens and should not be used as a sole basis for treatment. Nasal washings and aspirates  are unacceptable for Xpert Xpress SARS-CoV-2/FLU/RSV testing.  Fact Sheet for Patients: EntrepreneurPulse.com.au  Fact Sheet for Healthcare Providers: IncredibleEmployment.be  This test is not yet approved or cleared by the [place] FDA and has been authorized for detection and/or diagnosis of SARS-CoV-2 by FDA under an Emergency Use Authorization (EUA). This EUA will remain in effect (meaning this test can be used) for the duration of the COVID-19 declaration under Section 564(b)(1) of the Act, 21 U.S.C. section 360bbb-3(b)(1), unless the authorization is terminated or revoked.  Performed at [facility], [place] [address] [place], [place] [address]      Labs: BNP (last 3 results) No results for input(s): BNP in the last 8760 hours. Basic Metabolic Panel: Recent Labs  Lab 05/28/20 1103 05/29/20 0701 05/30/20 0247 05/31/20 0647  NA 145 141 141 140  K 3.7 3.0* 3.5 4.6  CL 110  108 109 107  CO2 22 24 23 25   GLUCOSE 102* 86 104* 90  BUN 12 9 7* 11  CREATININE 0.70 0.68 0.63 0.74  CALCIUM 9.2 8.6* 8.4* 8.9  MG  --   --  1.9 2.0   Liver Function Tests: Recent Labs  Lab 05/28/20 1103  AST 21  ALT 11  ALKPHOS 85  BILITOT 1.0  PROT 6.2*  ALBUMIN 3.6   Recent Labs  Lab 05/28/20 1103  LIPASE 32   No results for input(s): AMMONIA in the last 168 hours. CBC: Recent Labs  Lab 05/28/20 1103 05/28/20 2147 05/29/20 0701 05/30/20 0247 05/31/20 0647  WBC 6.2  --  2.9* 3.0* 3.6*  NEUTROABS 5.1  --   --  1.6* 2.0  HGB 10.9* 9.4* 9.3* 9.9* 10.1*  HCT 35.0* 28.0* 29.2* 30.0* 31.8*  MCV 107.0*  --  102.1* 100.7* 104.3*  PLT 239  --  212 208 221   Cardiac Enzymes: No results for input(s): CKTOTAL, CKMB, CKMBINDEX, TROPONINI in the last 168 hours. BNP: Invalid input(s): POCBNP CBG: No results for input(s): GLUCAP in the last 168 hours. D-Dimer No results for input(s): DDIMER in the last 72 hours. Hgb A1c No results  for input(s): HGBA1C in the last 72 hours. Lipid Profile No results for input(s): CHOL, HDL, LDLCALC, TRIG, CHOLHDL, LDLDIRECT in the last 72 hours. Thyroid function studies No results for input(s): TSH, T4TOTAL, T3FREE, THYROIDAB in the last 72 hours.  Invalid input(s): FREET3 Anemia work up No results for input(s): VITAMINB12, FOLATE, FERRITIN, TIBC, IRON, RETICCTPCT in the last 72 hours. Urinalysis    Component Value Date/Time   COLORURINE AMBER (A) 02/28/2020 1019   APPEARANCEUR CLOUDY (A) 02/28/2020 1019   LABSPEC 1.012 02/28/2020 1019   PHURINE 8.0 02/28/2020 1019   GLUCOSEU NEGATIVE 02/28/2020 1019   HGBUR NEGATIVE 02/28/2020 1019   BILIRUBINUR NEGATIVE 02/28/2020 1019   KETONESUR NEGATIVE 02/28/2020 1019   PROTEINUR NEGATIVE 02/28/2020 1019   UROBILINOGEN 0.2 07/07/2014 1451   NITRITE POSITIVE (A) 02/28/2020 1019   LEUKOCYTESUR NEGATIVE 02/28/2020 1019   Sepsis Labs Invalid input(s): PROCALCITONIN,  WBC,  LACTICIDVEN Microbiology Recent Results (from the past 240 hour(s))  Resp Panel by RT-PCR (Flu A&B, Covid) Nasopharyngeal Swab     Status: None   Collection Time: 05/28/20 11:12 AM   Specimen: Nasopharyngeal Swab; Nasopharyngeal(NP) swabs in vial transport medium  Result Value Ref Range Status   SARS Coronavirus 2 by RT PCR NEGATIVE NEGATIVE Final    Comment: (NOTE) SARS-CoV-2 target nucleic acids are NOT DETECTED.  The SARS-CoV-2 RNA is generally detectable in upper respiratory specimens during the acute phase of infection. The lowest concentration of SARS-CoV-2 viral copies this assay can detect is 138 copies/mL. A negative result does not preclude SARS-Cov-2 infection and should not be used as the sole basis for treatment or other patient management decisions. A negative result may occur with  improper specimen collection/handling, submission of specimen other than nasopharyngeal swab, presence of viral mutation(s) within the areas targeted by this assay, and  inadequate number of viral copies(<138 copies/mL). A negative result must be combined with clinical observations, patient history, and epidemiological information. The expected result is Negative.  Fact Sheet for Patients:  EntrepreneurPulse.com.au  Fact Sheet for Healthcare Providers:  IncredibleEmployment.be  This test is no t yet approved or cleared by the [place] FDA and  has been authorized for detection and/or diagnosis of SARS-CoV-2 by FDA under an Emergency Use Authorization (EUA). This EUA will remain  in effect (meaning this test can be used) for the duration of the COVID-19 declaration under Section 564(b)(1) of the Act, 21 U.S.C.section 360bbb-3(b)(1), unless the authorization is terminated  or revoked sooner.       Influenza A by PCR NEGATIVE NEGATIVE Final   Influenza B by PCR NEGATIVE NEGATIVE Final    Comment: (NOTE) The Xpert Xpress SARS-CoV-2/FLU/RSV plus assay is intended as an aid in the diagnosis of influenza from Nasopharyngeal swab specimens and should not be used as a sole basis for treatment. Nasal washings and aspirates are unacceptable for Xpert Xpress SARS-CoV-2/FLU/RSV testing.  Fact Sheet for Patients: EntrepreneurPulse.com.au  Fact Sheet for Healthcare Providers: IncredibleEmployment.be  This test is not yet approved or cleared by the [place] FDA and has been authorized for detection and/or diagnosis of SARS-CoV-2 by FDA under an Emergency Use Authorization (EUA). This EUA will remain in effect (meaning this test can be used) for the duration of the COVID-19 declaration under Section 564(b)(1) of the Act, 21 U.S.C. section 360bbb-3(b)(1), unless the authorization is terminated or revoked.  Performed at [facility], [place] [address] [place], [place] [address]     Procedures/Studies: CT ABDOMEN PELVIS WO CONTRAST  Result Date: 05/28/2020 CLINICAL  DATA:  Possible GI bleed. EXAM: CT ABDOMEN AND PELVIS WITHOUT CONTRAST TECHNIQUE: Multidetector CT imaging of the abdomen and pelvis was performed following the standard protocol without IV contrast. COMPARISON:  February 05, 2018 FINDINGS: Lower chest: No acute abnormality. Hepatobiliary: The liver is cirrhotic in appearance. A stable 7 mm cystic appearing area of low attenuation is seen within the posteromedial aspect of the right lobe of the liver (axial CT image 27, CT series number 3). No gallstones, gallbladder wall thickening, or biliary dilatation. Pancreas: Unremarkable. No pancreatic ductal dilatation or surrounding inflammatory changes. Spleen: Normal in size without focal abnormality. Adrenals/Urinary Tract: Adrenal glands are unremarkable. Kidneys are normal, without renal calculi, focal lesion, or hydronephrosis. Bladder is unremarkable. Stomach/Bowel: There is a moderate-sized hiatal hernia. The appendix is not clearly identified. A large amount of stool is seen throughout the colon. No evidence of bowel wall thickening, distention, or inflammatory changes. Vascular/Lymphatic: Aortic atherosclerosis. No enlarged abdominal or pelvic lymph nodes. Reproductive: Status post hysterectomy. No adnexal masses. Other: No abdominal wall hernia or abnormality. No abdominopelvic ascites. Musculoskeletal: A chronic compression fracture deformity is seen at the level of L2. Grade 1 anterolisthesis of the L4 vertebral body is noted on L5, with grade 1 anterolisthesis of the L5 vertebral body noted on S1. IMPRESSION: 1. Cirrhosis. 2. Moderate-sized hiatal hernia. 3. Chronic compression fracture deformity of L2. 4. Grade 1 anterolisthesis of the L4 vertebral body on L5, with grade 1 anterolisthesis of the L5 vertebral body noted on S1. 5. Aortic atherosclerosis. Aortic Atherosclerosis (ICD10-I70.0). Electronically Signed   By: Virgina Norfolk M.D.   On: 05/28/2020 15:38     Time coordinating discharge: Over 30  minutes    Dwyane Dee, MD  [facility] 05/31/2020, 3:46 PM

## 2020-05-31 NOTE — Progress Notes (Signed)
DISCHARGE NOTE HOME Megan Munoz to be discharged home per MD order. Discussed prescriptions and follow up appointments with the patient. Prescriptions given to patient; medication list explained in detail. Patient verbalized understanding.  Skin clean, dry and intact without evidence of skin break down, no evidence of skin tears noted. IV catheter discontinued intact. Site without signs and symptoms of complications. Dressing and pressure applied. Pt denies pain at the site currently. No complaints noted.  Patient free of lines, drains, and wounds.   An After Visit Summary (AVS) was printed and given to the patient. Patient escorted via wheelchair, and discharged home via private auto.  Candice S Applewhite, RN

## 2020-05-31 NOTE — Care Management Important Message (Signed)
Important Message  Patient Details  Name: Megan Munoz MRN: [id number] Date of Birth: 11-10-1932   Medicare Important Message Given:  Yes     [name] [name] 05/31/2020, 2:12 PM

## 2020-06-04 DIAGNOSIS — I110 Hypertensive heart disease with heart failure: Secondary | ICD-10-CM | POA: Diagnosis not present

## 2020-06-04 DIAGNOSIS — Z8673 Personal history of transient ischemic attack (TIA), and cerebral infarction without residual deficits: Secondary | ICD-10-CM | POA: Diagnosis not present

## 2020-06-04 DIAGNOSIS — S32401D Unspecified fracture of right acetabulum, subsequent encounter for fracture with routine healing: Secondary | ICD-10-CM | POA: Diagnosis not present

## 2020-06-04 DIAGNOSIS — Z85048 Personal history of other malignant neoplasm of rectum, rectosigmoid junction, and anus: Secondary | ICD-10-CM | POA: Diagnosis not present

## 2020-06-04 DIAGNOSIS — K627 Radiation proctitis: Secondary | ICD-10-CM | POA: Diagnosis not present

## 2020-06-04 DIAGNOSIS — D509 Iron deficiency anemia, unspecified: Secondary | ICD-10-CM | POA: Diagnosis not present

## 2020-06-04 DIAGNOSIS — J439 Emphysema, unspecified: Secondary | ICD-10-CM | POA: Diagnosis not present

## 2020-06-04 DIAGNOSIS — E440 Moderate protein-calorie malnutrition: Secondary | ICD-10-CM | POA: Diagnosis not present

## 2020-06-04 DIAGNOSIS — F0390 Unspecified dementia without behavioral disturbance: Secondary | ICD-10-CM | POA: Diagnosis not present

## 2020-06-04 DIAGNOSIS — E785 Hyperlipidemia, unspecified: Secondary | ICD-10-CM | POA: Diagnosis not present

## 2020-06-04 DIAGNOSIS — I4821 Permanent atrial fibrillation: Secondary | ICD-10-CM | POA: Diagnosis not present

## 2020-06-04 DIAGNOSIS — I5033 Acute on chronic diastolic (congestive) heart failure: Secondary | ICD-10-CM | POA: Diagnosis not present

## 2020-06-04 DIAGNOSIS — F32A Depression, unspecified: Secondary | ICD-10-CM | POA: Diagnosis not present

## 2020-06-04 DIAGNOSIS — F419 Anxiety disorder, unspecified: Secondary | ICD-10-CM | POA: Diagnosis not present

## 2020-06-04 DIAGNOSIS — Z9181 History of falling: Secondary | ICD-10-CM | POA: Diagnosis not present

## 2020-06-10 DIAGNOSIS — I509 Heart failure, unspecified: Secondary | ICD-10-CM | POA: Diagnosis not present

## 2020-06-10 DIAGNOSIS — I4891 Unspecified atrial fibrillation: Secondary | ICD-10-CM | POA: Diagnosis not present

## 2020-06-10 DIAGNOSIS — I5033 Acute on chronic diastolic (congestive) heart failure: Secondary | ICD-10-CM | POA: Diagnosis not present

## 2020-06-10 DIAGNOSIS — C20 Malignant neoplasm of rectum: Secondary | ICD-10-CM | POA: Diagnosis not present

## 2020-06-10 DIAGNOSIS — E440 Moderate protein-calorie malnutrition: Secondary | ICD-10-CM | POA: Diagnosis not present

## 2020-06-10 DIAGNOSIS — J439 Emphysema, unspecified: Secondary | ICD-10-CM | POA: Diagnosis not present

## 2020-06-10 DIAGNOSIS — F0390 Unspecified dementia without behavioral disturbance: Secondary | ICD-10-CM | POA: Diagnosis not present

## 2020-06-10 DIAGNOSIS — D649 Anemia, unspecified: Secondary | ICD-10-CM | POA: Diagnosis not present

## 2020-06-10 DIAGNOSIS — I110 Hypertensive heart disease with heart failure: Secondary | ICD-10-CM | POA: Diagnosis not present

## 2020-06-10 DIAGNOSIS — S32401D Unspecified fracture of right acetabulum, subsequent encounter for fracture with routine healing: Secondary | ICD-10-CM | POA: Diagnosis not present

## 2020-06-10 DIAGNOSIS — I4821 Permanent atrial fibrillation: Secondary | ICD-10-CM | POA: Diagnosis not present

## 2020-06-10 DIAGNOSIS — Z515 Encounter for palliative care: Secondary | ICD-10-CM | POA: Diagnosis not present

## 2020-06-11 DIAGNOSIS — Z85048 Personal history of other malignant neoplasm of rectum, rectosigmoid junction, and anus: Secondary | ICD-10-CM | POA: Diagnosis not present

## 2020-06-11 DIAGNOSIS — E440 Moderate protein-calorie malnutrition: Secondary | ICD-10-CM | POA: Diagnosis not present

## 2020-06-11 DIAGNOSIS — E785 Hyperlipidemia, unspecified: Secondary | ICD-10-CM | POA: Diagnosis not present

## 2020-06-11 DIAGNOSIS — Z8673 Personal history of transient ischemic attack (TIA), and cerebral infarction without residual deficits: Secondary | ICD-10-CM | POA: Diagnosis not present

## 2020-06-11 DIAGNOSIS — J439 Emphysema, unspecified: Secondary | ICD-10-CM | POA: Diagnosis not present

## 2020-06-11 DIAGNOSIS — D509 Iron deficiency anemia, unspecified: Secondary | ICD-10-CM | POA: Diagnosis not present

## 2020-06-11 DIAGNOSIS — F0390 Unspecified dementia without behavioral disturbance: Secondary | ICD-10-CM | POA: Diagnosis not present

## 2020-06-11 DIAGNOSIS — I110 Hypertensive heart disease with heart failure: Secondary | ICD-10-CM | POA: Diagnosis not present

## 2020-06-11 DIAGNOSIS — I4821 Permanent atrial fibrillation: Secondary | ICD-10-CM | POA: Diagnosis not present

## 2020-06-11 DIAGNOSIS — I5033 Acute on chronic diastolic (congestive) heart failure: Secondary | ICD-10-CM | POA: Diagnosis not present

## 2020-06-12 DIAGNOSIS — I4821 Permanent atrial fibrillation: Secondary | ICD-10-CM | POA: Diagnosis not present

## 2020-06-12 DIAGNOSIS — I110 Hypertensive heart disease with heart failure: Secondary | ICD-10-CM | POA: Diagnosis not present

## 2020-06-12 DIAGNOSIS — S32401D Unspecified fracture of right acetabulum, subsequent encounter for fracture with routine healing: Secondary | ICD-10-CM | POA: Diagnosis not present

## 2020-06-12 DIAGNOSIS — I5033 Acute on chronic diastolic (congestive) heart failure: Secondary | ICD-10-CM | POA: Diagnosis not present

## 2020-06-12 DIAGNOSIS — J439 Emphysema, unspecified: Secondary | ICD-10-CM | POA: Diagnosis not present

## 2020-06-12 DIAGNOSIS — F0390 Unspecified dementia without behavioral disturbance: Secondary | ICD-10-CM | POA: Diagnosis not present

## 2020-06-13 DIAGNOSIS — I4891 Unspecified atrial fibrillation: Secondary | ICD-10-CM | POA: Diagnosis not present

## 2020-06-13 DIAGNOSIS — I5032 Chronic diastolic (congestive) heart failure: Secondary | ICD-10-CM | POA: Diagnosis not present

## 2020-06-13 DIAGNOSIS — D500 Iron deficiency anemia secondary to blood loss (chronic): Secondary | ICD-10-CM | POA: Diagnosis not present

## 2020-06-13 DIAGNOSIS — Z681 Body mass index (BMI) 19 or less, adult: Secondary | ICD-10-CM | POA: Diagnosis not present

## 2020-06-13 DIAGNOSIS — F0150 Vascular dementia without behavioral disturbance: Secondary | ICD-10-CM | POA: Diagnosis not present

## 2020-06-13 DIAGNOSIS — J302 Other seasonal allergic rhinitis: Secondary | ICD-10-CM | POA: Diagnosis not present

## 2020-06-13 DIAGNOSIS — Z87891 Personal history of nicotine dependence: Secondary | ICD-10-CM | POA: Diagnosis not present

## 2020-06-13 DIAGNOSIS — K922 Gastrointestinal hemorrhage, unspecified: Secondary | ICD-10-CM | POA: Diagnosis not present

## 2020-06-13 DIAGNOSIS — I69818 Other symptoms and signs involving cognitive functions following other cerebrovascular disease: Secondary | ICD-10-CM | POA: Diagnosis not present

## 2020-06-13 DIAGNOSIS — I2510 Atherosclerotic heart disease of native coronary artery without angina pectoris: Secondary | ICD-10-CM | POA: Diagnosis not present

## 2020-06-13 DIAGNOSIS — J449 Chronic obstructive pulmonary disease, unspecified: Secondary | ICD-10-CM | POA: Diagnosis not present

## 2020-06-13 DIAGNOSIS — E785 Hyperlipidemia, unspecified: Secondary | ICD-10-CM | POA: Diagnosis not present

## 2020-06-13 DIAGNOSIS — I110 Hypertensive heart disease with heart failure: Secondary | ICD-10-CM | POA: Diagnosis not present

## 2020-06-13 DIAGNOSIS — R634 Abnormal weight loss: Secondary | ICD-10-CM | POA: Diagnosis not present

## 2020-06-14 DIAGNOSIS — D500 Iron deficiency anemia secondary to blood loss (chronic): Secondary | ICD-10-CM | POA: Diagnosis not present

## 2020-06-14 DIAGNOSIS — R634 Abnormal weight loss: Secondary | ICD-10-CM | POA: Diagnosis not present

## 2020-06-14 DIAGNOSIS — I69818 Other symptoms and signs involving cognitive functions following other cerebrovascular disease: Secondary | ICD-10-CM | POA: Diagnosis not present

## 2020-06-14 DIAGNOSIS — I5032 Chronic diastolic (congestive) heart failure: Secondary | ICD-10-CM | POA: Diagnosis not present

## 2020-06-14 DIAGNOSIS — K922 Gastrointestinal hemorrhage, unspecified: Secondary | ICD-10-CM | POA: Diagnosis not present

## 2020-06-14 DIAGNOSIS — I110 Hypertensive heart disease with heart failure: Secondary | ICD-10-CM | POA: Diagnosis not present

## 2020-06-15 DIAGNOSIS — E785 Hyperlipidemia, unspecified: Secondary | ICD-10-CM | POA: Diagnosis not present

## 2020-06-15 DIAGNOSIS — C20 Malignant neoplasm of rectum: Secondary | ICD-10-CM | POA: Diagnosis not present

## 2020-06-15 DIAGNOSIS — I4891 Unspecified atrial fibrillation: Secondary | ICD-10-CM | POA: Diagnosis not present

## 2020-06-15 DIAGNOSIS — F0390 Unspecified dementia without behavioral disturbance: Secondary | ICD-10-CM | POA: Diagnosis not present

## 2020-06-15 DIAGNOSIS — Z0000 Encounter for general adult medical examination without abnormal findings: Secondary | ICD-10-CM | POA: Diagnosis not present

## 2020-06-17 DIAGNOSIS — I110 Hypertensive heart disease with heart failure: Secondary | ICD-10-CM | POA: Diagnosis not present

## 2020-06-17 DIAGNOSIS — R634 Abnormal weight loss: Secondary | ICD-10-CM | POA: Diagnosis not present

## 2020-06-17 DIAGNOSIS — K922 Gastrointestinal hemorrhage, unspecified: Secondary | ICD-10-CM | POA: Diagnosis not present

## 2020-06-17 DIAGNOSIS — D500 Iron deficiency anemia secondary to blood loss (chronic): Secondary | ICD-10-CM | POA: Diagnosis not present

## 2020-06-17 DIAGNOSIS — I69818 Other symptoms and signs involving cognitive functions following other cerebrovascular disease: Secondary | ICD-10-CM | POA: Diagnosis not present

## 2020-06-17 DIAGNOSIS — I5032 Chronic diastolic (congestive) heart failure: Secondary | ICD-10-CM | POA: Diagnosis not present

## 2020-06-18 DIAGNOSIS — I69818 Other symptoms and signs involving cognitive functions following other cerebrovascular disease: Secondary | ICD-10-CM | POA: Diagnosis not present

## 2020-06-18 DIAGNOSIS — R634 Abnormal weight loss: Secondary | ICD-10-CM | POA: Diagnosis not present

## 2020-06-18 DIAGNOSIS — K922 Gastrointestinal hemorrhage, unspecified: Secondary | ICD-10-CM | POA: Diagnosis not present

## 2020-06-18 DIAGNOSIS — D500 Iron deficiency anemia secondary to blood loss (chronic): Secondary | ICD-10-CM | POA: Diagnosis not present

## 2020-06-18 DIAGNOSIS — I110 Hypertensive heart disease with heart failure: Secondary | ICD-10-CM | POA: Diagnosis not present

## 2020-06-18 DIAGNOSIS — I5032 Chronic diastolic (congestive) heart failure: Secondary | ICD-10-CM | POA: Diagnosis not present

## 2020-06-24 DIAGNOSIS — I5032 Chronic diastolic (congestive) heart failure: Secondary | ICD-10-CM | POA: Diagnosis not present

## 2020-06-24 DIAGNOSIS — R634 Abnormal weight loss: Secondary | ICD-10-CM | POA: Diagnosis not present

## 2020-06-24 DIAGNOSIS — D500 Iron deficiency anemia secondary to blood loss (chronic): Secondary | ICD-10-CM | POA: Diagnosis not present

## 2020-06-24 DIAGNOSIS — I110 Hypertensive heart disease with heart failure: Secondary | ICD-10-CM | POA: Diagnosis not present

## 2020-06-24 DIAGNOSIS — K922 Gastrointestinal hemorrhage, unspecified: Secondary | ICD-10-CM | POA: Diagnosis not present

## 2020-06-24 DIAGNOSIS — I69818 Other symptoms and signs involving cognitive functions following other cerebrovascular disease: Secondary | ICD-10-CM | POA: Diagnosis not present

## 2020-06-25 DIAGNOSIS — I69818 Other symptoms and signs involving cognitive functions following other cerebrovascular disease: Secondary | ICD-10-CM | POA: Diagnosis not present

## 2020-06-25 DIAGNOSIS — I110 Hypertensive heart disease with heart failure: Secondary | ICD-10-CM | POA: Diagnosis not present

## 2020-06-25 DIAGNOSIS — D500 Iron deficiency anemia secondary to blood loss (chronic): Secondary | ICD-10-CM | POA: Diagnosis not present

## 2020-06-25 DIAGNOSIS — I5032 Chronic diastolic (congestive) heart failure: Secondary | ICD-10-CM | POA: Diagnosis not present

## 2020-06-25 DIAGNOSIS — K922 Gastrointestinal hemorrhage, unspecified: Secondary | ICD-10-CM | POA: Diagnosis not present

## 2020-06-25 DIAGNOSIS — R634 Abnormal weight loss: Secondary | ICD-10-CM | POA: Diagnosis not present

## 2020-06-26 DIAGNOSIS — I110 Hypertensive heart disease with heart failure: Secondary | ICD-10-CM | POA: Diagnosis not present

## 2020-06-26 DIAGNOSIS — I69818 Other symptoms and signs involving cognitive functions following other cerebrovascular disease: Secondary | ICD-10-CM | POA: Diagnosis not present

## 2020-06-26 DIAGNOSIS — I5032 Chronic diastolic (congestive) heart failure: Secondary | ICD-10-CM | POA: Diagnosis not present

## 2020-06-26 DIAGNOSIS — D500 Iron deficiency anemia secondary to blood loss (chronic): Secondary | ICD-10-CM | POA: Diagnosis not present

## 2020-06-26 DIAGNOSIS — R634 Abnormal weight loss: Secondary | ICD-10-CM | POA: Diagnosis not present

## 2020-06-26 DIAGNOSIS — K922 Gastrointestinal hemorrhage, unspecified: Secondary | ICD-10-CM | POA: Diagnosis not present

## 2020-06-28 DIAGNOSIS — K922 Gastrointestinal hemorrhage, unspecified: Secondary | ICD-10-CM | POA: Diagnosis not present

## 2020-06-28 DIAGNOSIS — Z681 Body mass index (BMI) 19 or less, adult: Secondary | ICD-10-CM | POA: Diagnosis not present

## 2020-06-28 DIAGNOSIS — I5032 Chronic diastolic (congestive) heart failure: Secondary | ICD-10-CM | POA: Diagnosis not present

## 2020-06-28 DIAGNOSIS — I69818 Other symptoms and signs involving cognitive functions following other cerebrovascular disease: Secondary | ICD-10-CM | POA: Diagnosis not present

## 2020-06-28 DIAGNOSIS — R634 Abnormal weight loss: Secondary | ICD-10-CM | POA: Diagnosis not present

## 2020-06-28 DIAGNOSIS — J449 Chronic obstructive pulmonary disease, unspecified: Secondary | ICD-10-CM | POA: Diagnosis not present

## 2020-06-28 DIAGNOSIS — I110 Hypertensive heart disease with heart failure: Secondary | ICD-10-CM | POA: Diagnosis not present

## 2020-06-28 DIAGNOSIS — J302 Other seasonal allergic rhinitis: Secondary | ICD-10-CM | POA: Diagnosis not present

## 2020-06-28 DIAGNOSIS — Z87891 Personal history of nicotine dependence: Secondary | ICD-10-CM | POA: Diagnosis not present

## 2020-06-28 DIAGNOSIS — I4891 Unspecified atrial fibrillation: Secondary | ICD-10-CM | POA: Diagnosis not present

## 2020-06-28 DIAGNOSIS — I2510 Atherosclerotic heart disease of native coronary artery without angina pectoris: Secondary | ICD-10-CM | POA: Diagnosis not present

## 2020-06-28 DIAGNOSIS — F0150 Vascular dementia without behavioral disturbance: Secondary | ICD-10-CM | POA: Diagnosis not present

## 2020-06-28 DIAGNOSIS — D500 Iron deficiency anemia secondary to blood loss (chronic): Secondary | ICD-10-CM | POA: Diagnosis not present

## 2020-06-28 DIAGNOSIS — E785 Hyperlipidemia, unspecified: Secondary | ICD-10-CM | POA: Diagnosis not present

## 2020-07-01 ENCOUNTER — Ambulatory Visit: Payer: Medicare Other | Admitting: Neurology

## 2020-07-02 DIAGNOSIS — K922 Gastrointestinal hemorrhage, unspecified: Secondary | ICD-10-CM | POA: Diagnosis not present

## 2020-07-02 DIAGNOSIS — D500 Iron deficiency anemia secondary to blood loss (chronic): Secondary | ICD-10-CM | POA: Diagnosis not present

## 2020-07-02 DIAGNOSIS — I5032 Chronic diastolic (congestive) heart failure: Secondary | ICD-10-CM | POA: Diagnosis not present

## 2020-07-02 DIAGNOSIS — R634 Abnormal weight loss: Secondary | ICD-10-CM | POA: Diagnosis not present

## 2020-07-02 DIAGNOSIS — I110 Hypertensive heart disease with heart failure: Secondary | ICD-10-CM | POA: Diagnosis not present

## 2020-07-02 DIAGNOSIS — I69818 Other symptoms and signs involving cognitive functions following other cerebrovascular disease: Secondary | ICD-10-CM | POA: Diagnosis not present

## 2020-07-03 DIAGNOSIS — K922 Gastrointestinal hemorrhage, unspecified: Secondary | ICD-10-CM | POA: Diagnosis not present

## 2020-07-03 DIAGNOSIS — R634 Abnormal weight loss: Secondary | ICD-10-CM | POA: Diagnosis not present

## 2020-07-03 DIAGNOSIS — I69818 Other symptoms and signs involving cognitive functions following other cerebrovascular disease: Secondary | ICD-10-CM | POA: Diagnosis not present

## 2020-07-03 DIAGNOSIS — I5032 Chronic diastolic (congestive) heart failure: Secondary | ICD-10-CM | POA: Diagnosis not present

## 2020-07-03 DIAGNOSIS — D500 Iron deficiency anemia secondary to blood loss (chronic): Secondary | ICD-10-CM | POA: Diagnosis not present

## 2020-07-03 DIAGNOSIS — I110 Hypertensive heart disease with heart failure: Secondary | ICD-10-CM | POA: Diagnosis not present

## 2020-07-05 DIAGNOSIS — D500 Iron deficiency anemia secondary to blood loss (chronic): Secondary | ICD-10-CM | POA: Diagnosis not present

## 2020-07-05 DIAGNOSIS — I5032 Chronic diastolic (congestive) heart failure: Secondary | ICD-10-CM | POA: Diagnosis not present

## 2020-07-05 DIAGNOSIS — I69818 Other symptoms and signs involving cognitive functions following other cerebrovascular disease: Secondary | ICD-10-CM | POA: Diagnosis not present

## 2020-07-05 DIAGNOSIS — R634 Abnormal weight loss: Secondary | ICD-10-CM | POA: Diagnosis not present

## 2020-07-05 DIAGNOSIS — I110 Hypertensive heart disease with heart failure: Secondary | ICD-10-CM | POA: Diagnosis not present

## 2020-07-05 DIAGNOSIS — K922 Gastrointestinal hemorrhage, unspecified: Secondary | ICD-10-CM | POA: Diagnosis not present

## 2020-07-08 DIAGNOSIS — F0390 Unspecified dementia without behavioral disturbance: Secondary | ICD-10-CM | POA: Diagnosis not present

## 2020-07-08 DIAGNOSIS — C20 Malignant neoplasm of rectum: Secondary | ICD-10-CM | POA: Diagnosis not present

## 2020-07-08 DIAGNOSIS — I110 Hypertensive heart disease with heart failure: Secondary | ICD-10-CM | POA: Diagnosis not present

## 2020-07-08 DIAGNOSIS — I69818 Other symptoms and signs involving cognitive functions following other cerebrovascular disease: Secondary | ICD-10-CM | POA: Diagnosis not present

## 2020-07-08 DIAGNOSIS — K922 Gastrointestinal hemorrhage, unspecified: Secondary | ICD-10-CM | POA: Diagnosis not present

## 2020-07-08 DIAGNOSIS — I5032 Chronic diastolic (congestive) heart failure: Secondary | ICD-10-CM | POA: Diagnosis not present

## 2020-07-08 DIAGNOSIS — R634 Abnormal weight loss: Secondary | ICD-10-CM | POA: Diagnosis not present

## 2020-07-08 DIAGNOSIS — D500 Iron deficiency anemia secondary to blood loss (chronic): Secondary | ICD-10-CM | POA: Diagnosis not present

## 2020-07-09 DIAGNOSIS — I69818 Other symptoms and signs involving cognitive functions following other cerebrovascular disease: Secondary | ICD-10-CM | POA: Diagnosis not present

## 2020-07-09 DIAGNOSIS — D500 Iron deficiency anemia secondary to blood loss (chronic): Secondary | ICD-10-CM | POA: Diagnosis not present

## 2020-07-09 DIAGNOSIS — K922 Gastrointestinal hemorrhage, unspecified: Secondary | ICD-10-CM | POA: Diagnosis not present

## 2020-07-09 DIAGNOSIS — I5032 Chronic diastolic (congestive) heart failure: Secondary | ICD-10-CM | POA: Diagnosis not present

## 2020-07-09 DIAGNOSIS — I110 Hypertensive heart disease with heart failure: Secondary | ICD-10-CM | POA: Diagnosis not present

## 2020-07-09 DIAGNOSIS — R634 Abnormal weight loss: Secondary | ICD-10-CM | POA: Diagnosis not present

## 2020-07-11 DIAGNOSIS — R634 Abnormal weight loss: Secondary | ICD-10-CM | POA: Diagnosis not present

## 2020-07-11 DIAGNOSIS — D500 Iron deficiency anemia secondary to blood loss (chronic): Secondary | ICD-10-CM | POA: Diagnosis not present

## 2020-07-11 DIAGNOSIS — I110 Hypertensive heart disease with heart failure: Secondary | ICD-10-CM | POA: Diagnosis not present

## 2020-07-11 DIAGNOSIS — K922 Gastrointestinal hemorrhage, unspecified: Secondary | ICD-10-CM | POA: Diagnosis not present

## 2020-07-11 DIAGNOSIS — I69818 Other symptoms and signs involving cognitive functions following other cerebrovascular disease: Secondary | ICD-10-CM | POA: Diagnosis not present

## 2020-07-11 DIAGNOSIS — I5032 Chronic diastolic (congestive) heart failure: Secondary | ICD-10-CM | POA: Diagnosis not present

## 2020-07-15 DIAGNOSIS — I110 Hypertensive heart disease with heart failure: Secondary | ICD-10-CM | POA: Diagnosis not present

## 2020-07-15 DIAGNOSIS — K922 Gastrointestinal hemorrhage, unspecified: Secondary | ICD-10-CM | POA: Diagnosis not present

## 2020-07-15 DIAGNOSIS — R634 Abnormal weight loss: Secondary | ICD-10-CM | POA: Diagnosis not present

## 2020-07-15 DIAGNOSIS — I69818 Other symptoms and signs involving cognitive functions following other cerebrovascular disease: Secondary | ICD-10-CM | POA: Diagnosis not present

## 2020-07-15 DIAGNOSIS — D500 Iron deficiency anemia secondary to blood loss (chronic): Secondary | ICD-10-CM | POA: Diagnosis not present

## 2020-07-15 DIAGNOSIS — I5032 Chronic diastolic (congestive) heart failure: Secondary | ICD-10-CM | POA: Diagnosis not present

## 2020-07-16 DIAGNOSIS — K922 Gastrointestinal hemorrhage, unspecified: Secondary | ICD-10-CM | POA: Diagnosis not present

## 2020-07-16 DIAGNOSIS — I5032 Chronic diastolic (congestive) heart failure: Secondary | ICD-10-CM | POA: Diagnosis not present

## 2020-07-16 DIAGNOSIS — R634 Abnormal weight loss: Secondary | ICD-10-CM | POA: Diagnosis not present

## 2020-07-16 DIAGNOSIS — D500 Iron deficiency anemia secondary to blood loss (chronic): Secondary | ICD-10-CM | POA: Diagnosis not present

## 2020-07-16 DIAGNOSIS — I69818 Other symptoms and signs involving cognitive functions following other cerebrovascular disease: Secondary | ICD-10-CM | POA: Diagnosis not present

## 2020-07-16 DIAGNOSIS — I110 Hypertensive heart disease with heart failure: Secondary | ICD-10-CM | POA: Diagnosis not present

## 2020-07-22 DIAGNOSIS — D500 Iron deficiency anemia secondary to blood loss (chronic): Secondary | ICD-10-CM | POA: Diagnosis not present

## 2020-07-22 DIAGNOSIS — K922 Gastrointestinal hemorrhage, unspecified: Secondary | ICD-10-CM | POA: Diagnosis not present

## 2020-07-22 DIAGNOSIS — I110 Hypertensive heart disease with heart failure: Secondary | ICD-10-CM | POA: Diagnosis not present

## 2020-07-22 DIAGNOSIS — I69818 Other symptoms and signs involving cognitive functions following other cerebrovascular disease: Secondary | ICD-10-CM | POA: Diagnosis not present

## 2020-07-22 DIAGNOSIS — I5032 Chronic diastolic (congestive) heart failure: Secondary | ICD-10-CM | POA: Diagnosis not present

## 2020-07-22 DIAGNOSIS — R634 Abnormal weight loss: Secondary | ICD-10-CM | POA: Diagnosis not present

## 2020-07-23 DIAGNOSIS — I69818 Other symptoms and signs involving cognitive functions following other cerebrovascular disease: Secondary | ICD-10-CM | POA: Diagnosis not present

## 2020-07-23 DIAGNOSIS — I110 Hypertensive heart disease with heart failure: Secondary | ICD-10-CM | POA: Diagnosis not present

## 2020-07-23 DIAGNOSIS — D500 Iron deficiency anemia secondary to blood loss (chronic): Secondary | ICD-10-CM | POA: Diagnosis not present

## 2020-07-23 DIAGNOSIS — I5032 Chronic diastolic (congestive) heart failure: Secondary | ICD-10-CM | POA: Diagnosis not present

## 2020-07-23 DIAGNOSIS — K922 Gastrointestinal hemorrhage, unspecified: Secondary | ICD-10-CM | POA: Diagnosis not present

## 2020-07-23 DIAGNOSIS — R634 Abnormal weight loss: Secondary | ICD-10-CM | POA: Diagnosis not present

## 2020-07-25 DIAGNOSIS — I5032 Chronic diastolic (congestive) heart failure: Secondary | ICD-10-CM | POA: Diagnosis not present

## 2020-07-25 DIAGNOSIS — R634 Abnormal weight loss: Secondary | ICD-10-CM | POA: Diagnosis not present

## 2020-07-25 DIAGNOSIS — I69818 Other symptoms and signs involving cognitive functions following other cerebrovascular disease: Secondary | ICD-10-CM | POA: Diagnosis not present

## 2020-07-25 DIAGNOSIS — D500 Iron deficiency anemia secondary to blood loss (chronic): Secondary | ICD-10-CM | POA: Diagnosis not present

## 2020-07-25 DIAGNOSIS — K922 Gastrointestinal hemorrhage, unspecified: Secondary | ICD-10-CM | POA: Diagnosis not present

## 2020-07-25 DIAGNOSIS — I110 Hypertensive heart disease with heart failure: Secondary | ICD-10-CM | POA: Diagnosis not present

## 2020-07-30 DIAGNOSIS — F0150 Vascular dementia without behavioral disturbance: Secondary | ICD-10-CM | POA: Diagnosis not present

## 2020-07-30 DIAGNOSIS — I5032 Chronic diastolic (congestive) heart failure: Secondary | ICD-10-CM | POA: Diagnosis not present

## 2020-07-30 DIAGNOSIS — I4891 Unspecified atrial fibrillation: Secondary | ICD-10-CM | POA: Diagnosis not present

## 2020-07-30 DIAGNOSIS — E785 Hyperlipidemia, unspecified: Secondary | ICD-10-CM | POA: Diagnosis not present

## 2020-07-30 DIAGNOSIS — I69818 Other symptoms and signs involving cognitive functions following other cerebrovascular disease: Secondary | ICD-10-CM | POA: Diagnosis not present

## 2020-07-30 DIAGNOSIS — F419 Anxiety disorder, unspecified: Secondary | ICD-10-CM | POA: Diagnosis not present

## 2020-07-30 DIAGNOSIS — I2510 Atherosclerotic heart disease of native coronary artery without angina pectoris: Secondary | ICD-10-CM | POA: Diagnosis not present

## 2020-07-30 DIAGNOSIS — D500 Iron deficiency anemia secondary to blood loss (chronic): Secondary | ICD-10-CM | POA: Diagnosis not present

## 2020-07-30 DIAGNOSIS — J302 Other seasonal allergic rhinitis: Secondary | ICD-10-CM | POA: Diagnosis not present

## 2020-07-31 DIAGNOSIS — F419 Anxiety disorder, unspecified: Secondary | ICD-10-CM | POA: Diagnosis not present

## 2020-07-31 DIAGNOSIS — E785 Hyperlipidemia, unspecified: Secondary | ICD-10-CM | POA: Diagnosis not present

## 2020-07-31 DIAGNOSIS — I69818 Other symptoms and signs involving cognitive functions following other cerebrovascular disease: Secondary | ICD-10-CM | POA: Diagnosis not present

## 2020-07-31 DIAGNOSIS — I2510 Atherosclerotic heart disease of native coronary artery without angina pectoris: Secondary | ICD-10-CM | POA: Diagnosis not present

## 2020-07-31 DIAGNOSIS — I4891 Unspecified atrial fibrillation: Secondary | ICD-10-CM | POA: Diagnosis not present

## 2020-07-31 DIAGNOSIS — I5032 Chronic diastolic (congestive) heart failure: Secondary | ICD-10-CM | POA: Diagnosis not present

## 2020-08-01 DIAGNOSIS — I4891 Unspecified atrial fibrillation: Secondary | ICD-10-CM | POA: Diagnosis not present

## 2020-08-01 DIAGNOSIS — I2510 Atherosclerotic heart disease of native coronary artery without angina pectoris: Secondary | ICD-10-CM | POA: Diagnosis not present

## 2020-08-01 DIAGNOSIS — I69818 Other symptoms and signs involving cognitive functions following other cerebrovascular disease: Secondary | ICD-10-CM | POA: Diagnosis not present

## 2020-08-01 DIAGNOSIS — I5032 Chronic diastolic (congestive) heart failure: Secondary | ICD-10-CM | POA: Diagnosis not present

## 2020-08-01 DIAGNOSIS — F419 Anxiety disorder, unspecified: Secondary | ICD-10-CM | POA: Diagnosis not present

## 2020-08-01 DIAGNOSIS — E785 Hyperlipidemia, unspecified: Secondary | ICD-10-CM | POA: Diagnosis not present

## 2020-08-02 DIAGNOSIS — I5032 Chronic diastolic (congestive) heart failure: Secondary | ICD-10-CM | POA: Diagnosis not present

## 2020-08-02 DIAGNOSIS — I69818 Other symptoms and signs involving cognitive functions following other cerebrovascular disease: Secondary | ICD-10-CM | POA: Diagnosis not present

## 2020-08-02 DIAGNOSIS — I4891 Unspecified atrial fibrillation: Secondary | ICD-10-CM | POA: Diagnosis not present

## 2020-08-02 DIAGNOSIS — E785 Hyperlipidemia, unspecified: Secondary | ICD-10-CM | POA: Diagnosis not present

## 2020-08-02 DIAGNOSIS — F419 Anxiety disorder, unspecified: Secondary | ICD-10-CM | POA: Diagnosis not present

## 2020-08-02 DIAGNOSIS — I2510 Atherosclerotic heart disease of native coronary artery without angina pectoris: Secondary | ICD-10-CM | POA: Diagnosis not present

## 2020-08-09 DIAGNOSIS — I5032 Chronic diastolic (congestive) heart failure: Secondary | ICD-10-CM | POA: Diagnosis not present

## 2020-08-09 DIAGNOSIS — I2510 Atherosclerotic heart disease of native coronary artery without angina pectoris: Secondary | ICD-10-CM | POA: Diagnosis not present

## 2020-08-09 DIAGNOSIS — I4891 Unspecified atrial fibrillation: Secondary | ICD-10-CM | POA: Diagnosis not present

## 2020-08-09 DIAGNOSIS — E785 Hyperlipidemia, unspecified: Secondary | ICD-10-CM | POA: Diagnosis not present

## 2020-08-09 DIAGNOSIS — I69818 Other symptoms and signs involving cognitive functions following other cerebrovascular disease: Secondary | ICD-10-CM | POA: Diagnosis not present

## 2020-08-09 DIAGNOSIS — F419 Anxiety disorder, unspecified: Secondary | ICD-10-CM | POA: Diagnosis not present

## 2020-08-13 DIAGNOSIS — I2510 Atherosclerotic heart disease of native coronary artery without angina pectoris: Secondary | ICD-10-CM | POA: Diagnosis not present

## 2020-08-13 DIAGNOSIS — F419 Anxiety disorder, unspecified: Secondary | ICD-10-CM | POA: Diagnosis not present

## 2020-08-13 DIAGNOSIS — E785 Hyperlipidemia, unspecified: Secondary | ICD-10-CM | POA: Diagnosis not present

## 2020-08-13 DIAGNOSIS — I5032 Chronic diastolic (congestive) heart failure: Secondary | ICD-10-CM | POA: Diagnosis not present

## 2020-08-13 DIAGNOSIS — I69818 Other symptoms and signs involving cognitive functions following other cerebrovascular disease: Secondary | ICD-10-CM | POA: Diagnosis not present

## 2020-08-13 DIAGNOSIS — I4891 Unspecified atrial fibrillation: Secondary | ICD-10-CM | POA: Diagnosis not present

## 2020-08-14 DIAGNOSIS — F419 Anxiety disorder, unspecified: Secondary | ICD-10-CM | POA: Diagnosis not present

## 2020-08-14 DIAGNOSIS — E785 Hyperlipidemia, unspecified: Secondary | ICD-10-CM | POA: Diagnosis not present

## 2020-08-14 DIAGNOSIS — I5032 Chronic diastolic (congestive) heart failure: Secondary | ICD-10-CM | POA: Diagnosis not present

## 2020-08-14 DIAGNOSIS — I2510 Atherosclerotic heart disease of native coronary artery without angina pectoris: Secondary | ICD-10-CM | POA: Diagnosis not present

## 2020-08-14 DIAGNOSIS — I69818 Other symptoms and signs involving cognitive functions following other cerebrovascular disease: Secondary | ICD-10-CM | POA: Diagnosis not present

## 2020-08-14 DIAGNOSIS — I4891 Unspecified atrial fibrillation: Secondary | ICD-10-CM | POA: Diagnosis not present

## 2020-08-16 DIAGNOSIS — F419 Anxiety disorder, unspecified: Secondary | ICD-10-CM | POA: Diagnosis not present

## 2020-08-16 DIAGNOSIS — I4891 Unspecified atrial fibrillation: Secondary | ICD-10-CM | POA: Diagnosis not present

## 2020-08-16 DIAGNOSIS — E785 Hyperlipidemia, unspecified: Secondary | ICD-10-CM | POA: Diagnosis not present

## 2020-08-16 DIAGNOSIS — I5032 Chronic diastolic (congestive) heart failure: Secondary | ICD-10-CM | POA: Diagnosis not present

## 2020-08-16 DIAGNOSIS — I2510 Atherosclerotic heart disease of native coronary artery without angina pectoris: Secondary | ICD-10-CM | POA: Diagnosis not present

## 2020-08-16 DIAGNOSIS — I69818 Other symptoms and signs involving cognitive functions following other cerebrovascular disease: Secondary | ICD-10-CM | POA: Diagnosis not present

## 2020-08-22 DIAGNOSIS — I4891 Unspecified atrial fibrillation: Secondary | ICD-10-CM | POA: Diagnosis not present

## 2020-08-22 DIAGNOSIS — F419 Anxiety disorder, unspecified: Secondary | ICD-10-CM | POA: Diagnosis not present

## 2020-08-22 DIAGNOSIS — I5032 Chronic diastolic (congestive) heart failure: Secondary | ICD-10-CM | POA: Diagnosis not present

## 2020-08-22 DIAGNOSIS — I69818 Other symptoms and signs involving cognitive functions following other cerebrovascular disease: Secondary | ICD-10-CM | POA: Diagnosis not present

## 2020-08-22 DIAGNOSIS — E785 Hyperlipidemia, unspecified: Secondary | ICD-10-CM | POA: Diagnosis not present

## 2020-08-22 DIAGNOSIS — I2510 Atherosclerotic heart disease of native coronary artery without angina pectoris: Secondary | ICD-10-CM | POA: Diagnosis not present

## 2020-08-23 DIAGNOSIS — I69818 Other symptoms and signs involving cognitive functions following other cerebrovascular disease: Secondary | ICD-10-CM | POA: Diagnosis not present

## 2020-08-23 DIAGNOSIS — I5032 Chronic diastolic (congestive) heart failure: Secondary | ICD-10-CM | POA: Diagnosis not present

## 2020-08-23 DIAGNOSIS — F419 Anxiety disorder, unspecified: Secondary | ICD-10-CM | POA: Diagnosis not present

## 2020-08-23 DIAGNOSIS — I4891 Unspecified atrial fibrillation: Secondary | ICD-10-CM | POA: Diagnosis not present

## 2020-08-23 DIAGNOSIS — I2510 Atherosclerotic heart disease of native coronary artery without angina pectoris: Secondary | ICD-10-CM | POA: Diagnosis not present

## 2020-08-23 DIAGNOSIS — E785 Hyperlipidemia, unspecified: Secondary | ICD-10-CM | POA: Diagnosis not present

## 2021-06-08 IMAGING — MR MR PELVIS W/O CM
5 of 7 series · 24 of 48 positions shown · non-contrast
Comparison: 09/03/2018

CLINICAL DATA: Follow-up rectal adenocarcinoma. Status post
neoadjuvant radiation therapy and chemotherapy.

EXAM:
MRI PELVIS WITHOUT CONTRAST
TECHNIQUE: Multiplanar multisequence MR imaging of the pelvis was performed. No
intravenous contrast was administered.

[Series 2: [id number] · sagittal · 3.0mm · 0.47mm/px · 4 of 45 slices shown]
[im 1/45]
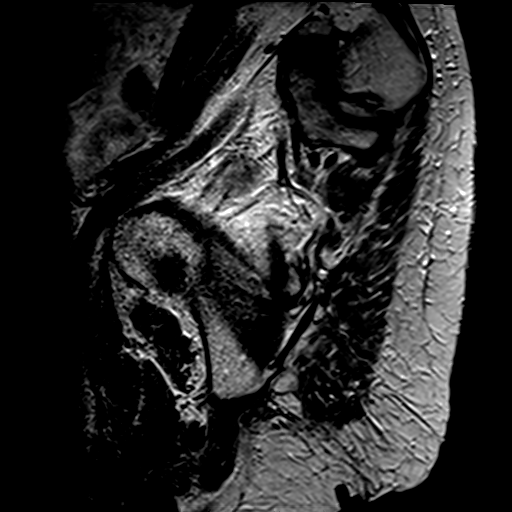
[im 15/45]
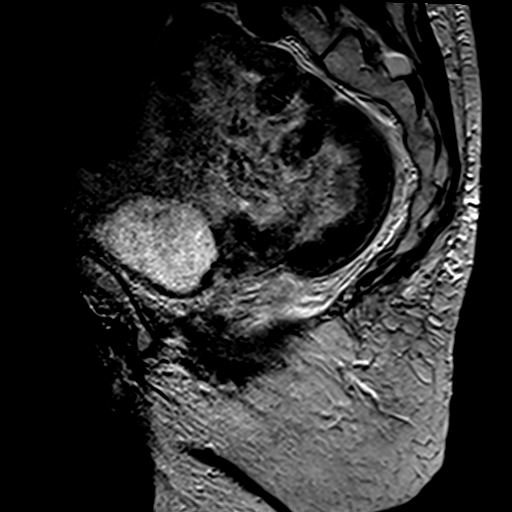
[im 30/45]
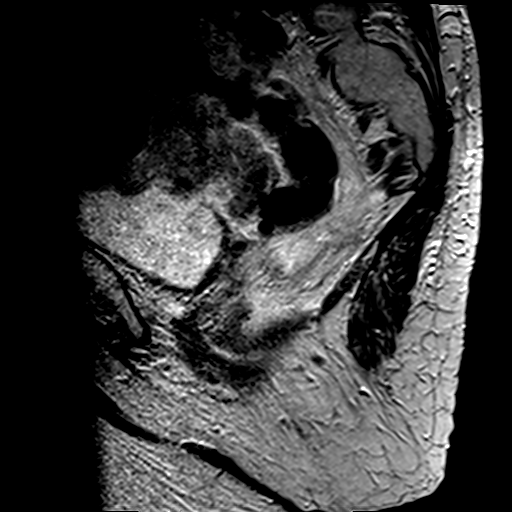
[im 45/45]
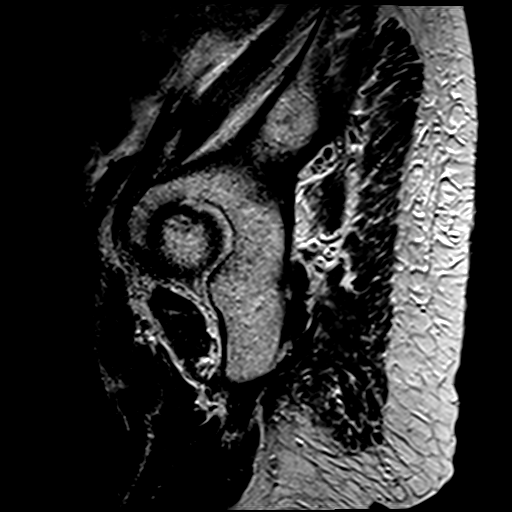

[Series 3: T2 · axial · 5.0mm · 0.99mm/px · z∈[-205,+64]mm · 5 of 50 slices shown (1 of 4)]
[im 1/50]
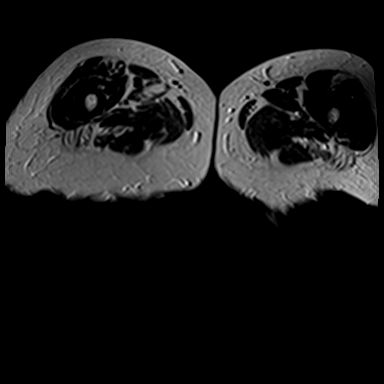
[im 13/50]
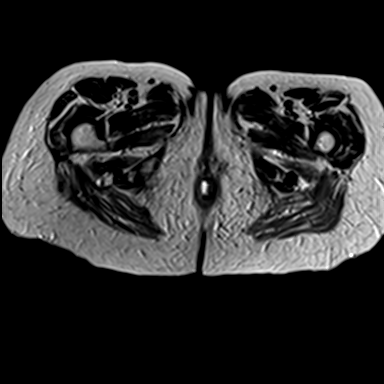
[im 25/50]
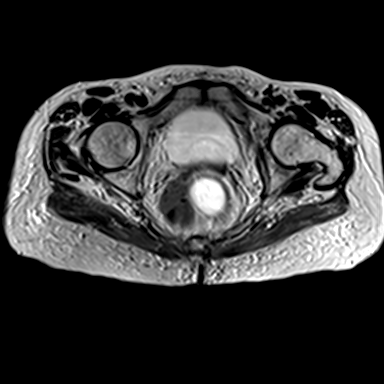
[im 37/50]
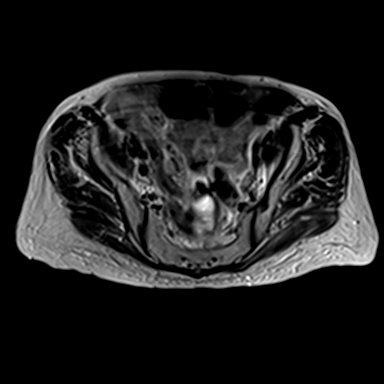
[im 50/50]
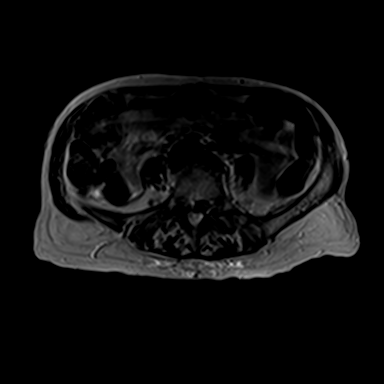

[Series 4: T2 · axial · 3.0mm · 0.47mm/px · z∈[-147,-4]mm · 6 of 50 slices shown (2 of 4)]
[im 1/50]
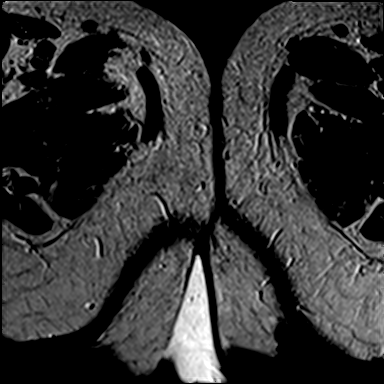
[im 10/50]
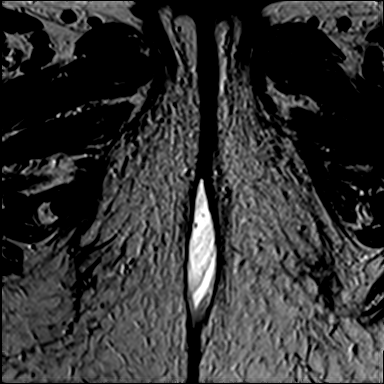
[im 20/50]
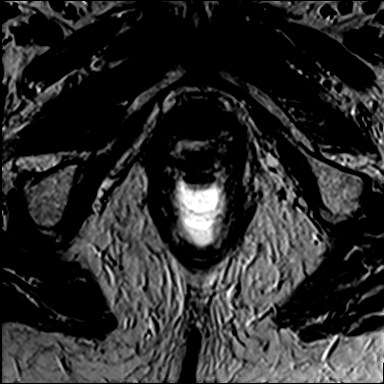
[im 30/50]
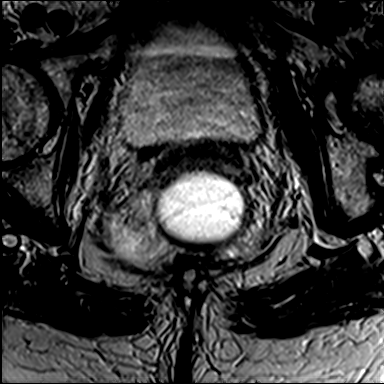
[im 40/50]
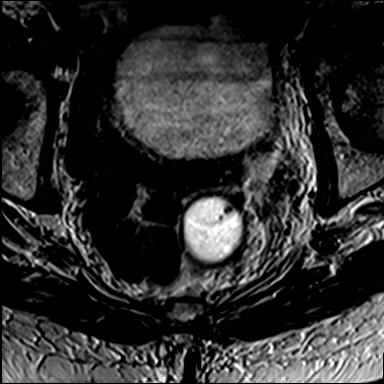
[im 50/50]
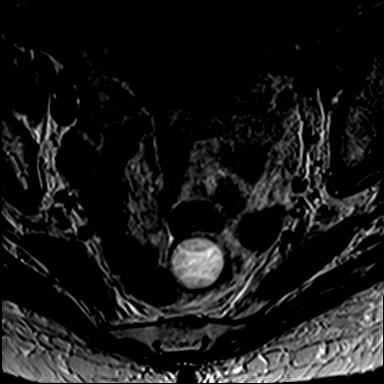

[Series 5: T2 · coronal · 3.0mm · 0.39mm/px · 5 of 40 slices shown (3 of 4)]
[im 1/40]
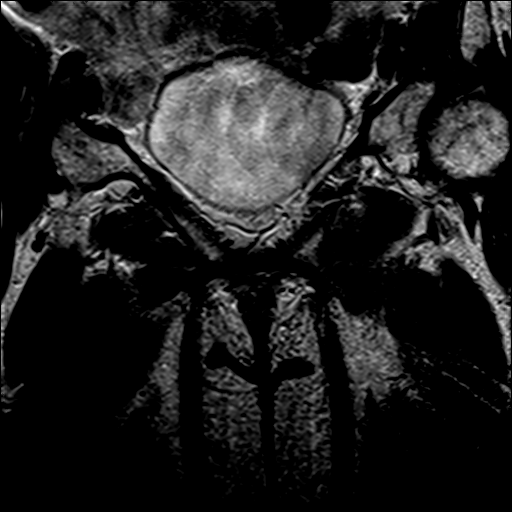
[im 10/40]
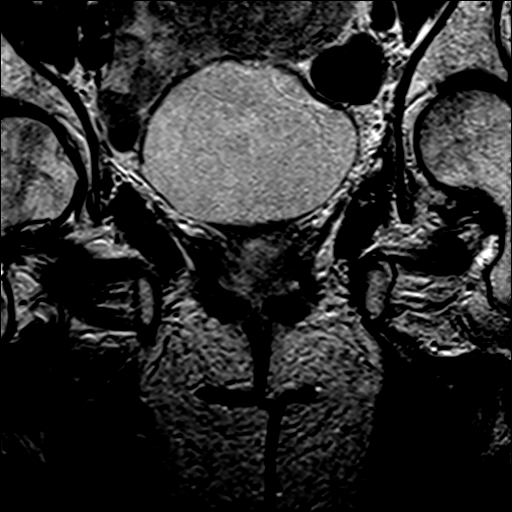
[im 20/40]
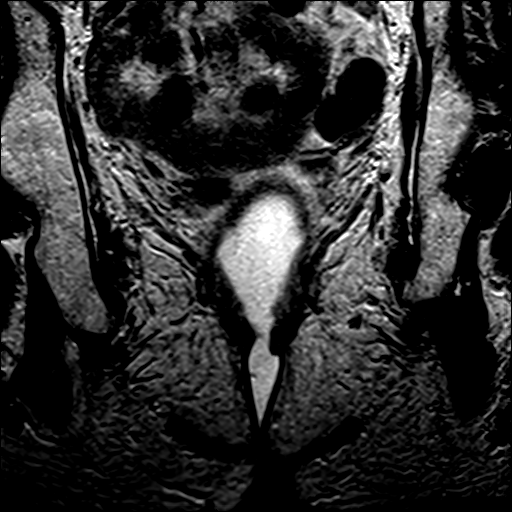
[im 30/40]
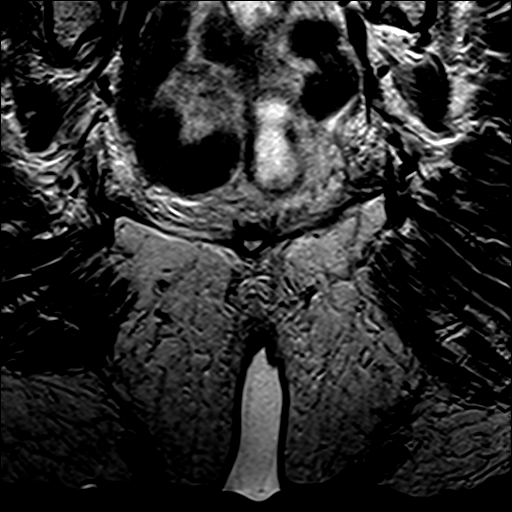
[im 40/40]
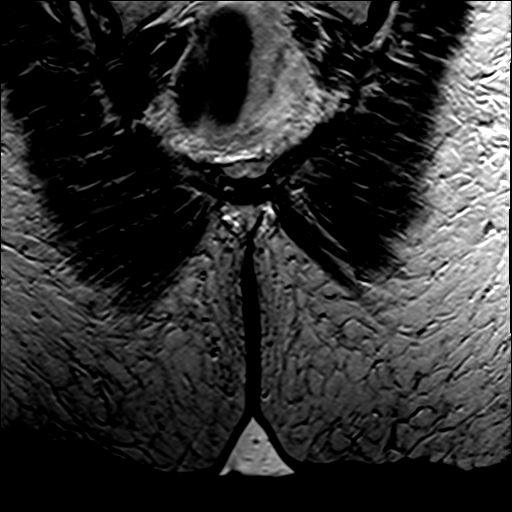

[Series 6: T2 · coronal · 3.0mm · 0.39mm/px · 4 of 40 slices shown (4 of 4)]
[im 1/40]
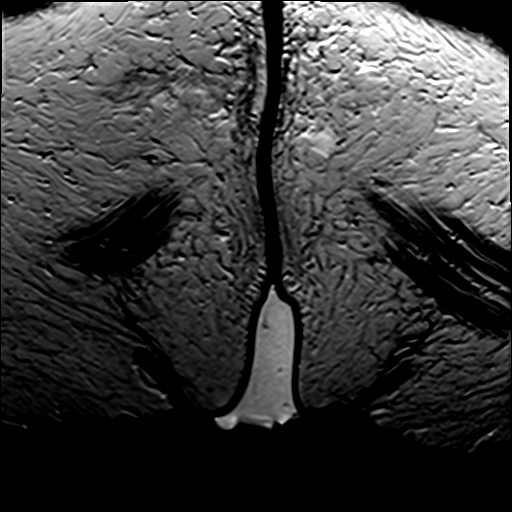
[im 10/40]
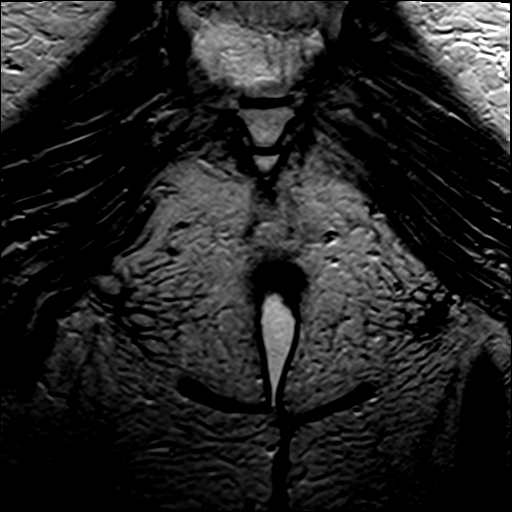
[im 20/40]
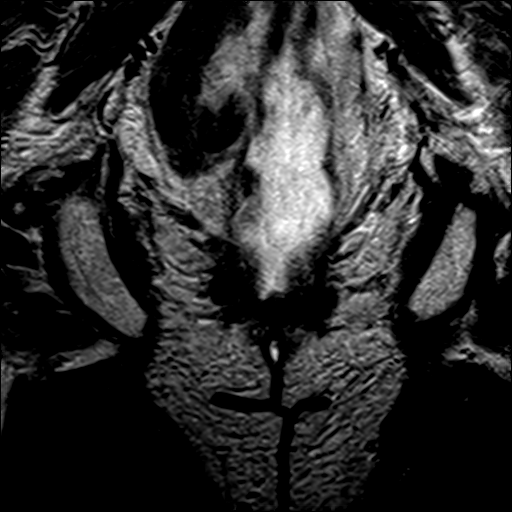
[im 30/40]
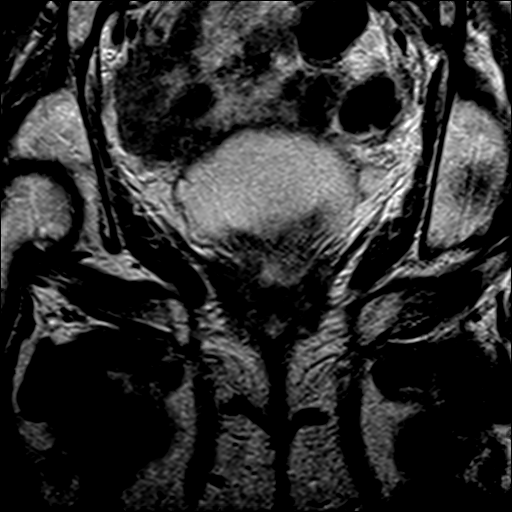

[24 of 48 positions shown; findings below may reference images not displayed]

FINDINGS: Lower Urinary Tract: No urinary bladder abnormality identified.

Bowel: Image degradation by motion artifact noted. Previously seen
mass along the right anterolateral wall of the anorectal junction is
no longer visualized.

Vascular/Lymphatic: Unremarkable. No pathologically enlarged pelvic
lymph nodes identified.

Reproductive: Prior hysterectomy. Vaginal cuff is unremarkable in
appearance. No adnexal mass or free fluid identified.

Other: No peritoneal thickening or abnormal free fluid.

Musculoskeletal:  Unremarkable.
IMPRESSION: Interval resolution of previously seen mass at the anorectal
junction. No residual soft tissue mass visualized. No evidence of
pelvic metastatic disease.
